# Patient Record
Sex: Male | Born: 1945
Health system: Southern US, Community
[De-identification: ages and names within clinical notes are randomized; demographics above are authoritative.]

## PROBLEM LIST (undated history)

## (undated) DIAGNOSIS — M109 Gout, unspecified: Secondary | ICD-10-CM

## (undated) DIAGNOSIS — F1010 Alcohol abuse, uncomplicated: Secondary | ICD-10-CM

## (undated) DIAGNOSIS — I519 Heart disease, unspecified: Secondary | ICD-10-CM

## (undated) DIAGNOSIS — H9190 Unspecified hearing loss, unspecified ear: Secondary | ICD-10-CM

## (undated) DIAGNOSIS — E785 Hyperlipidemia, unspecified: Secondary | ICD-10-CM

## (undated) DIAGNOSIS — I2510 Atherosclerotic heart disease of native coronary artery without angina pectoris: Secondary | ICD-10-CM

## (undated) DIAGNOSIS — K4090 Unilateral inguinal hernia, without obstruction or gangrene, not specified as recurrent: Secondary | ICD-10-CM

## (undated) DIAGNOSIS — Z9289 Personal history of other medical treatment: Secondary | ICD-10-CM

## (undated) DIAGNOSIS — I252 Old myocardial infarction: Secondary | ICD-10-CM

## (undated) HISTORY — DX: Atherosclerotic heart disease of native coronary artery without angina pectoris: I25.10

## (undated) HISTORY — DX: Unspecified hearing loss, unspecified ear: H91.90

## (undated) HISTORY — DX: Heart disease, unspecified: I51.9

## (undated) HISTORY — DX: Hyperlipidemia, unspecified: E78.5

## (undated) HISTORY — DX: Personal history of other medical treatment: Z92.89

## (undated) HISTORY — DX: Old myocardial infarction: I25.2

## (undated) HISTORY — DX: Alcohol abuse, uncomplicated: F10.10

## (undated) HISTORY — DX: Unilateral inguinal hernia, without obstruction or gangrene, not specified as recurrent: K40.90

## (undated) HISTORY — DX: Gout, unspecified: M10.9

---
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
## 2006-09-13 DIAGNOSIS — I252 Old myocardial infarction: Secondary | ICD-10-CM

## 2006-09-13 HISTORY — DX: Old myocardial infarction: I25.2

## 2008-09-12 ENCOUNTER — Inpatient Hospital Stay (HOSPITAL_COMMUNITY): Admission: RE | Admit: 2008-09-12 | Discharge: 2008-09-19 | Payer: Self-pay | Admitting: Cardiovascular Disease

## 2008-09-12 ENCOUNTER — Encounter: Payer: Self-pay | Admitting: Family Medicine

## 2008-09-12 ENCOUNTER — Encounter: Payer: Self-pay | Admitting: Emergency Medicine

## 2008-09-12 DIAGNOSIS — I252 Old myocardial infarction: Secondary | ICD-10-CM | POA: Insufficient documentation

## 2008-09-12 HISTORY — PX: CORONARY STENT PLACEMENT: SHX1402

## 2008-09-12 IMAGING — CR DG CHEST 1V PORT
1 series · 1 of 1 positions shown · non-contrast
Comparison: None.

CLINICAL DATA: Chest pain

CHEST - 1 VIEW

[view not recorded]
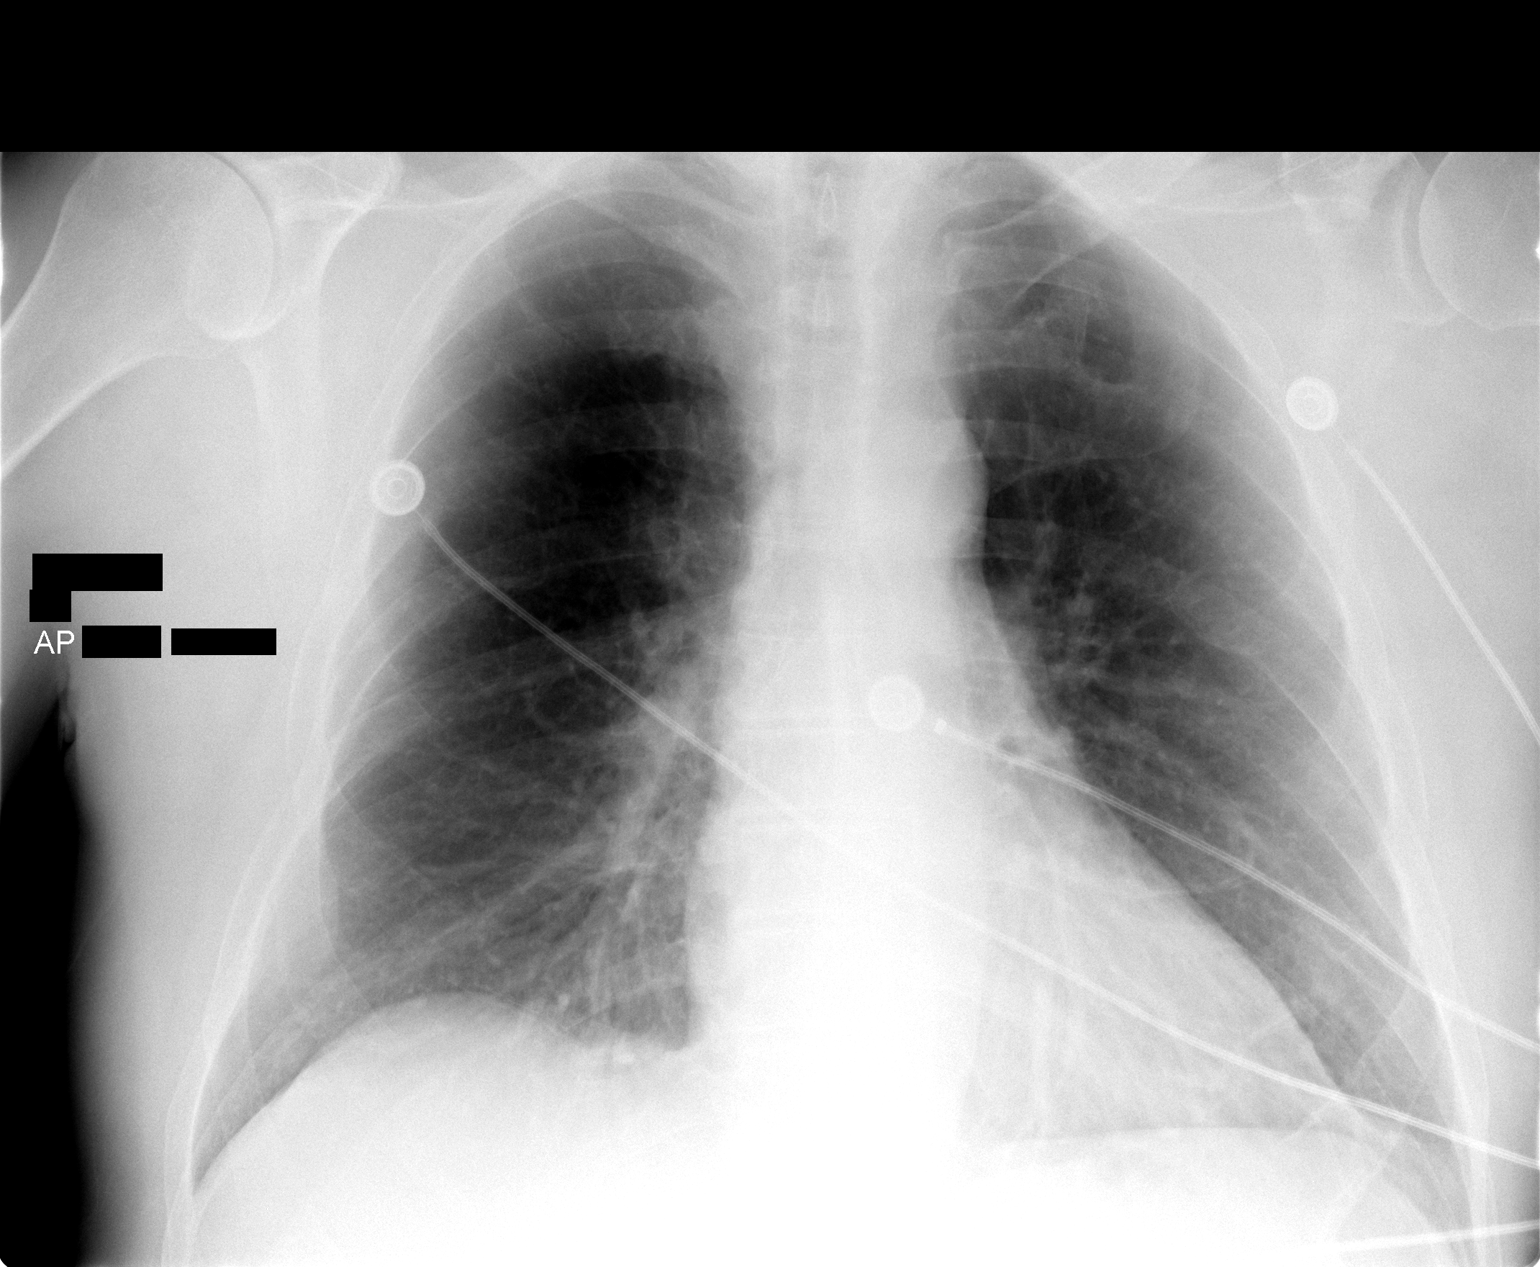

[1 of 1 positions shown; findings below may reference images not displayed]

FINDINGS: The heart size and mediastinal contours are within normal
limits.  Both lungs are clear.
IMPRESSION: No active disease.

## 2008-09-13 IMAGING — CR DG CHEST 1V PORT
1 series · 1 of 1 positions shown · non-contrast
Comparison: [DATE]

CLINICAL DATA: Congestive heart failure.  Shortness of breath.

PORTABLE CHEST - 1 VIEW

[view not recorded]
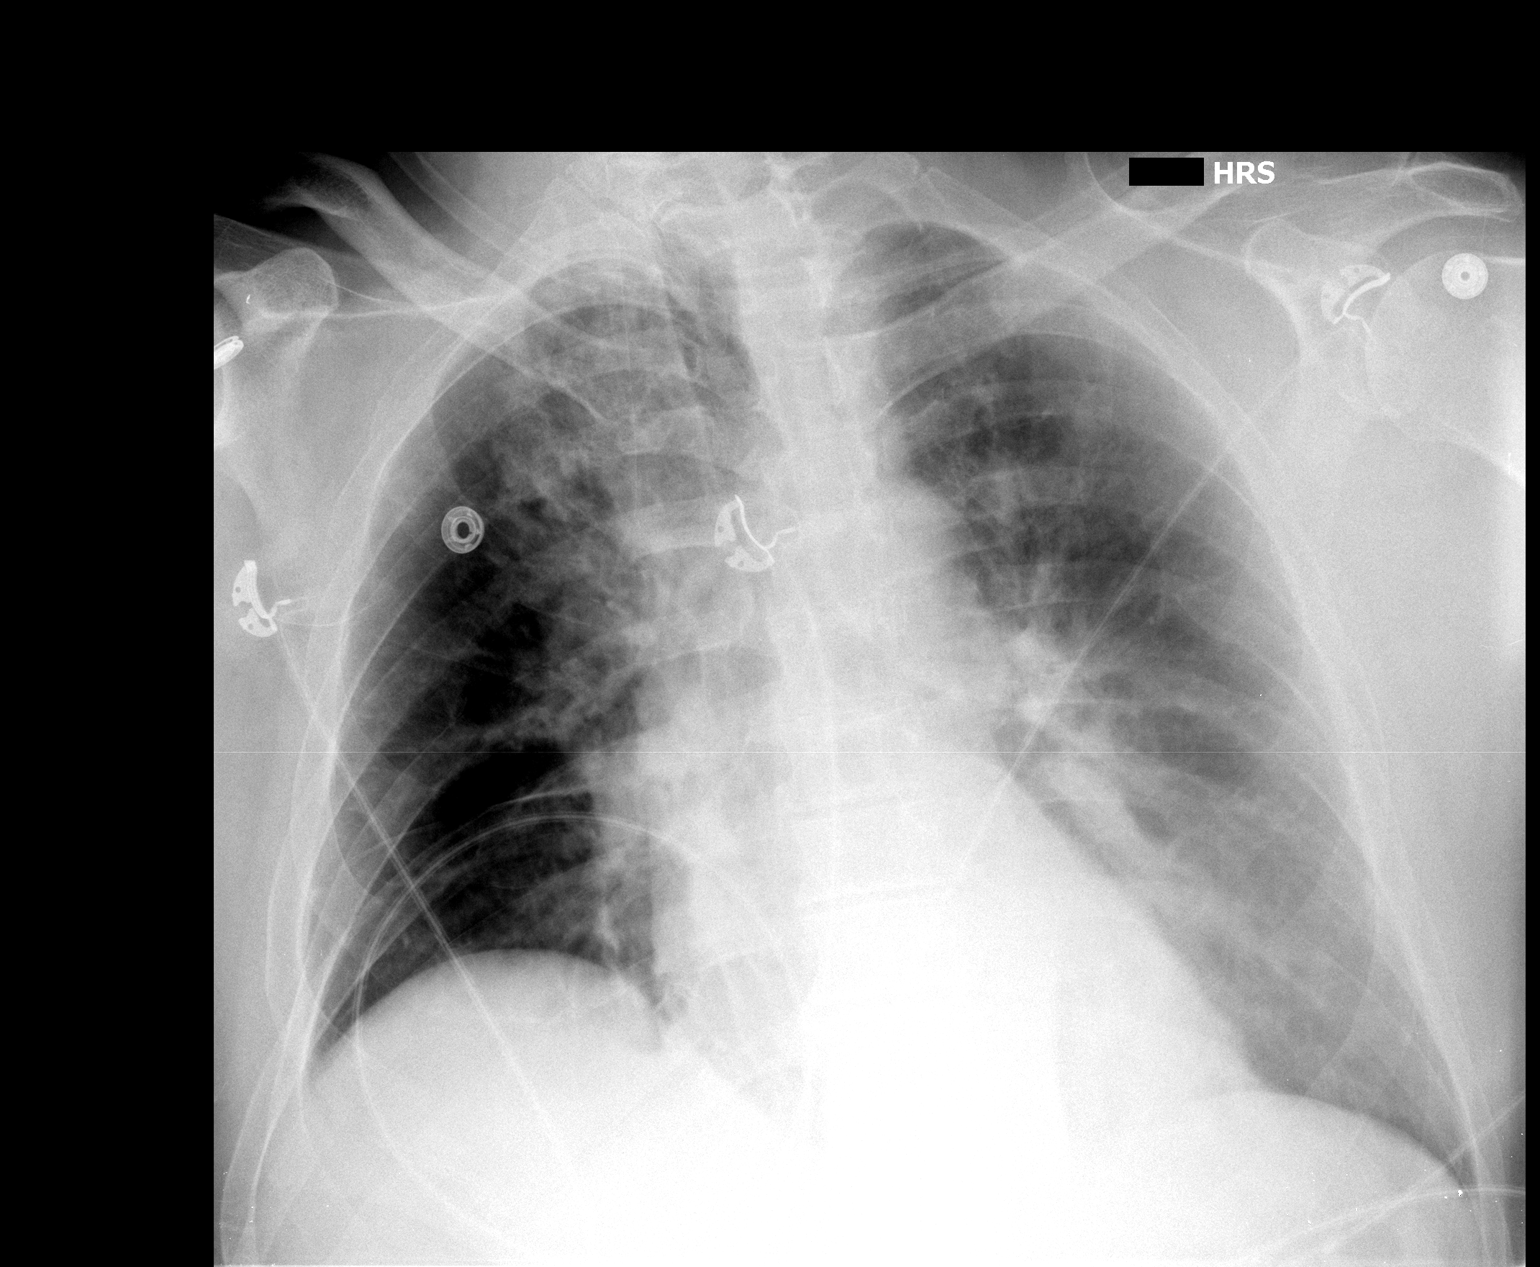

[1 of 1 positions shown; findings below may reference images not displayed]

FINDINGS: The patient has developed mild perihilar infiltrates
since the prior study.  Overall heart size is normal.  There are no
effusions.

This could represent mild pulmonary edema or pneumonia.  Is the
patient febrile?
IMPRESSION: Bilateral new perihilar infiltrates.

## 2008-09-14 IMAGING — CR DG CHEST 1V PORT
1 series · 1 of 1 positions shown · non-contrast
Comparison: [DATE]

CLINICAL DATA: Chest pain, weakness and shortness of breath

PORTABLE CHEST - 1 VIEW

[view not recorded]
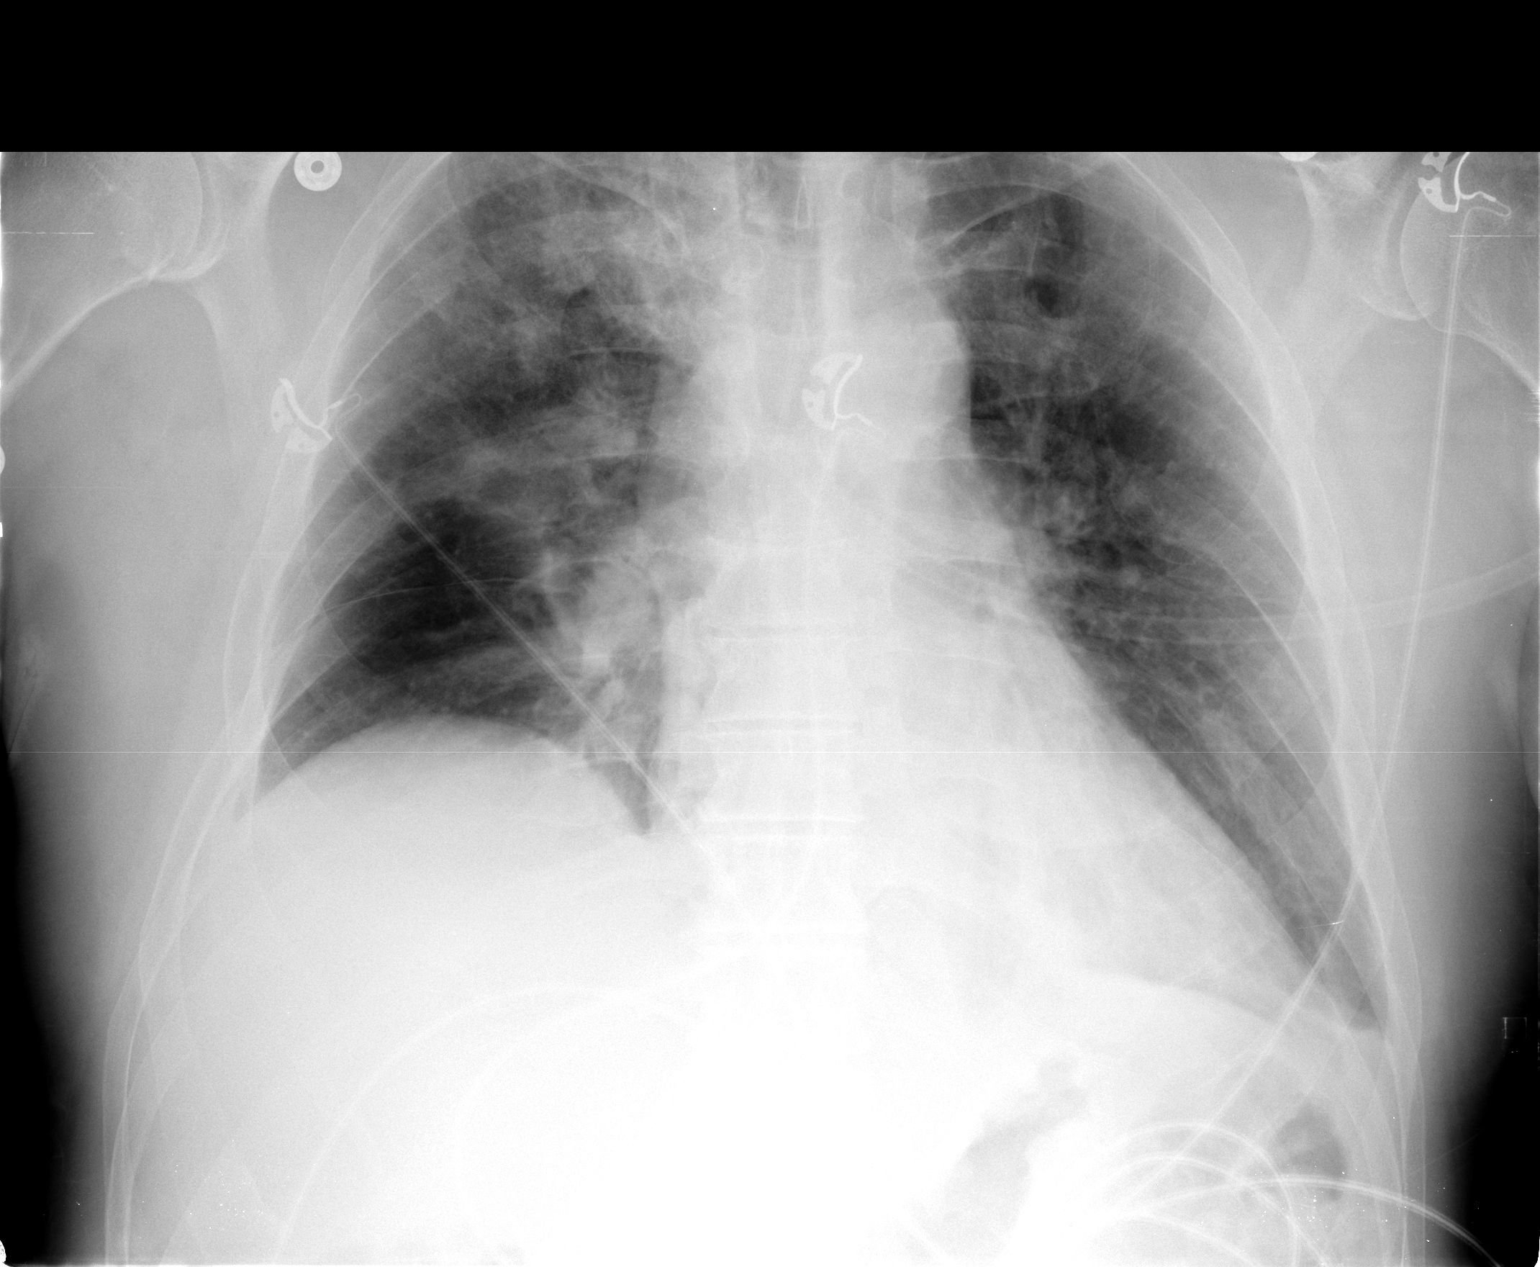

[1 of 1 positions shown; findings below may reference images not displayed]

FINDINGS: Heart size is mildly enlarged.

There is a small left pleural effusion.

Bilateral pulmonary infiltrates are unchanged from prior exam.
IMPRESSION: 1.  No change in aeration the lungs.

## 2008-09-15 ENCOUNTER — Encounter (INDEPENDENT_AMBULATORY_CARE_PROVIDER_SITE_OTHER): Payer: Self-pay | Admitting: Cardiovascular Disease

## 2008-09-15 ENCOUNTER — Ambulatory Visit: Payer: Self-pay | Admitting: Surgery

## 2008-09-15 IMAGING — CR DG CHEST 1V PORT
1 series · 1 of 1 positions shown · non-contrast
Comparison: [DATE]

CLINICAL DATA: Myocardial infarction.  Short of breath.

PORTABLE CHEST - 1 VIEW

[AP]
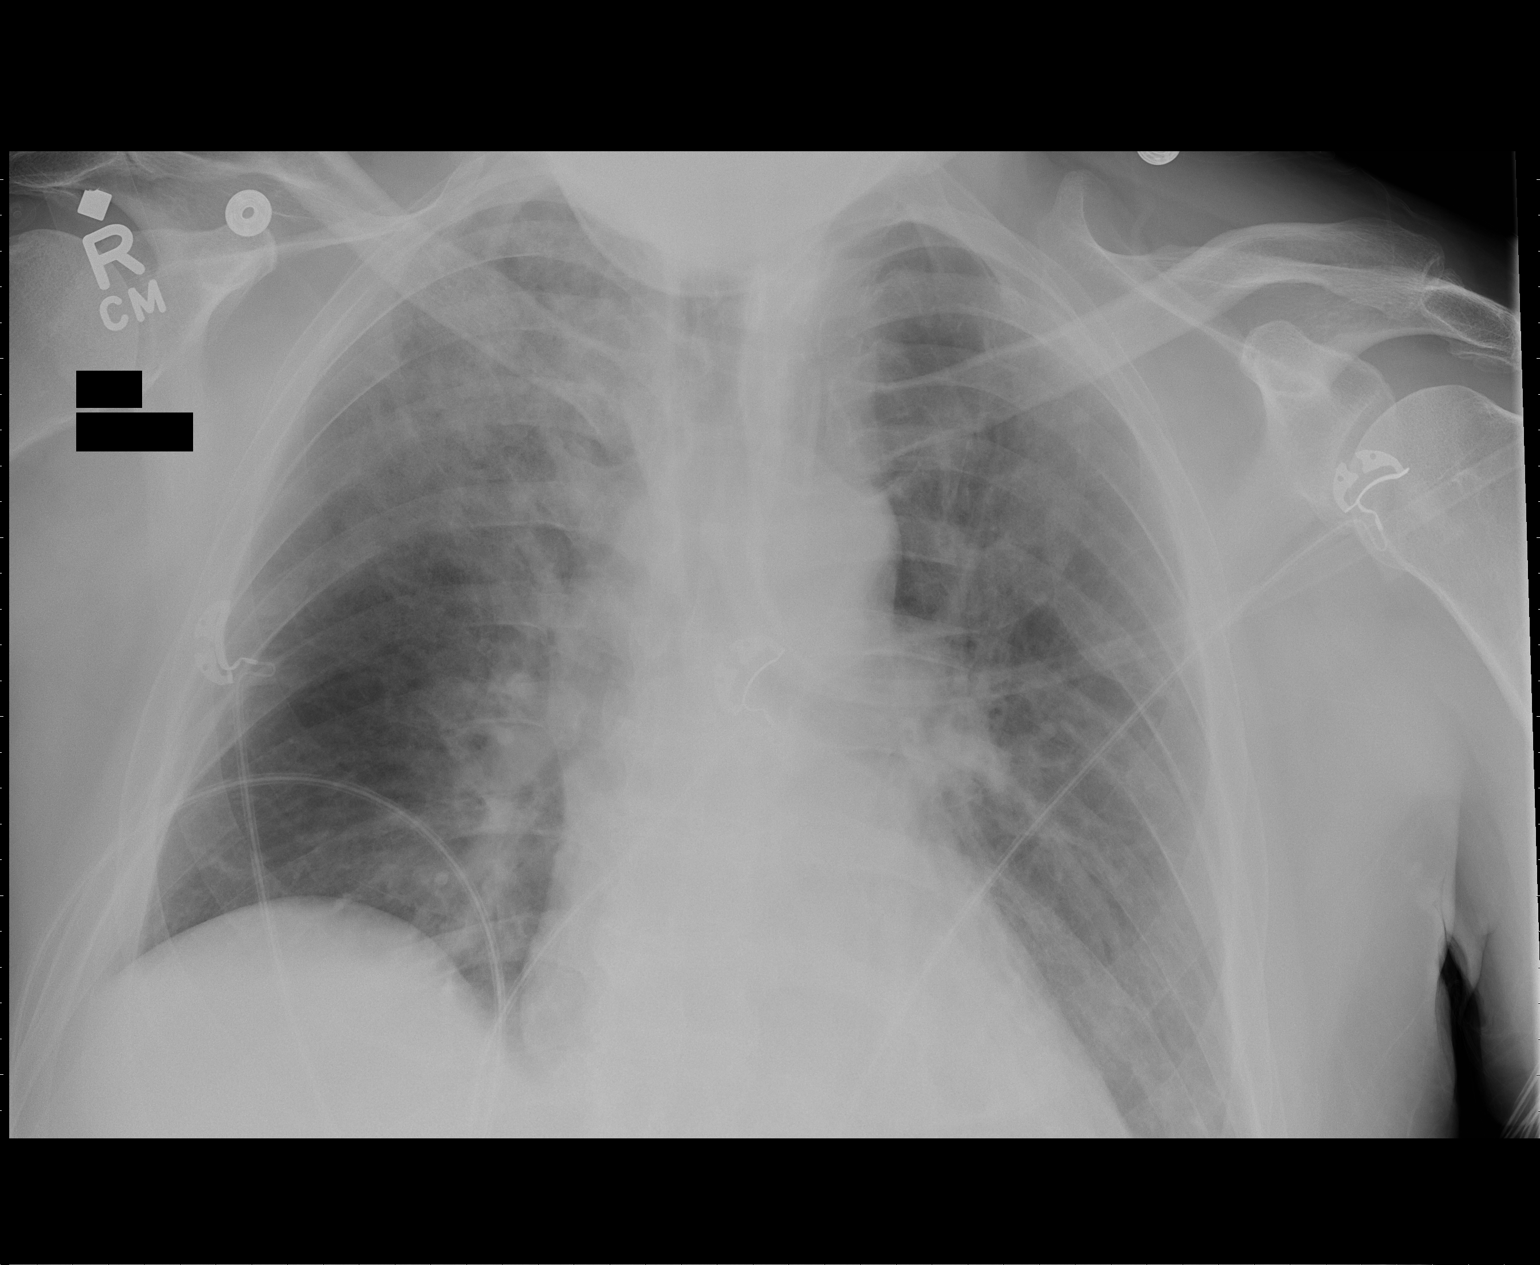

[1 of 1 positions shown; findings below may reference images not displayed]

FINDINGS: Persistent elevation of the right hemidiaphragm.
Worsening airspace disease at the right apex, likely representing
atypical/asymmetric pulmonary edema.  Aspiration would be a
secondary consideration.  Oxygen tubing is projected over the
chest.  Pulmonary vascular congestion is present.  Left
costophrenic angle excluded from view.  No definite pleural
effusion.  Cardiopericardial silhouette appears within normal
limits for size.  Mediastinal contours unchanged.
IMPRESSION: 1.  Slightly increased airspace opacity at the right apex, probably
representing asymmetric/atypical pulmonary edema.
2.  Pulmonary vascular congestion and mild pulmonary edema .

## 2008-09-16 IMAGING — CR DG CHEST 1V PORT
1 series · 1 of 1 positions shown · non-contrast
Comparison: [DATE]

CLINICAL DATA: Congestive heart failure.  Myocardial infarction.

PORTABLE CHEST - 1 VIEW

[AP]
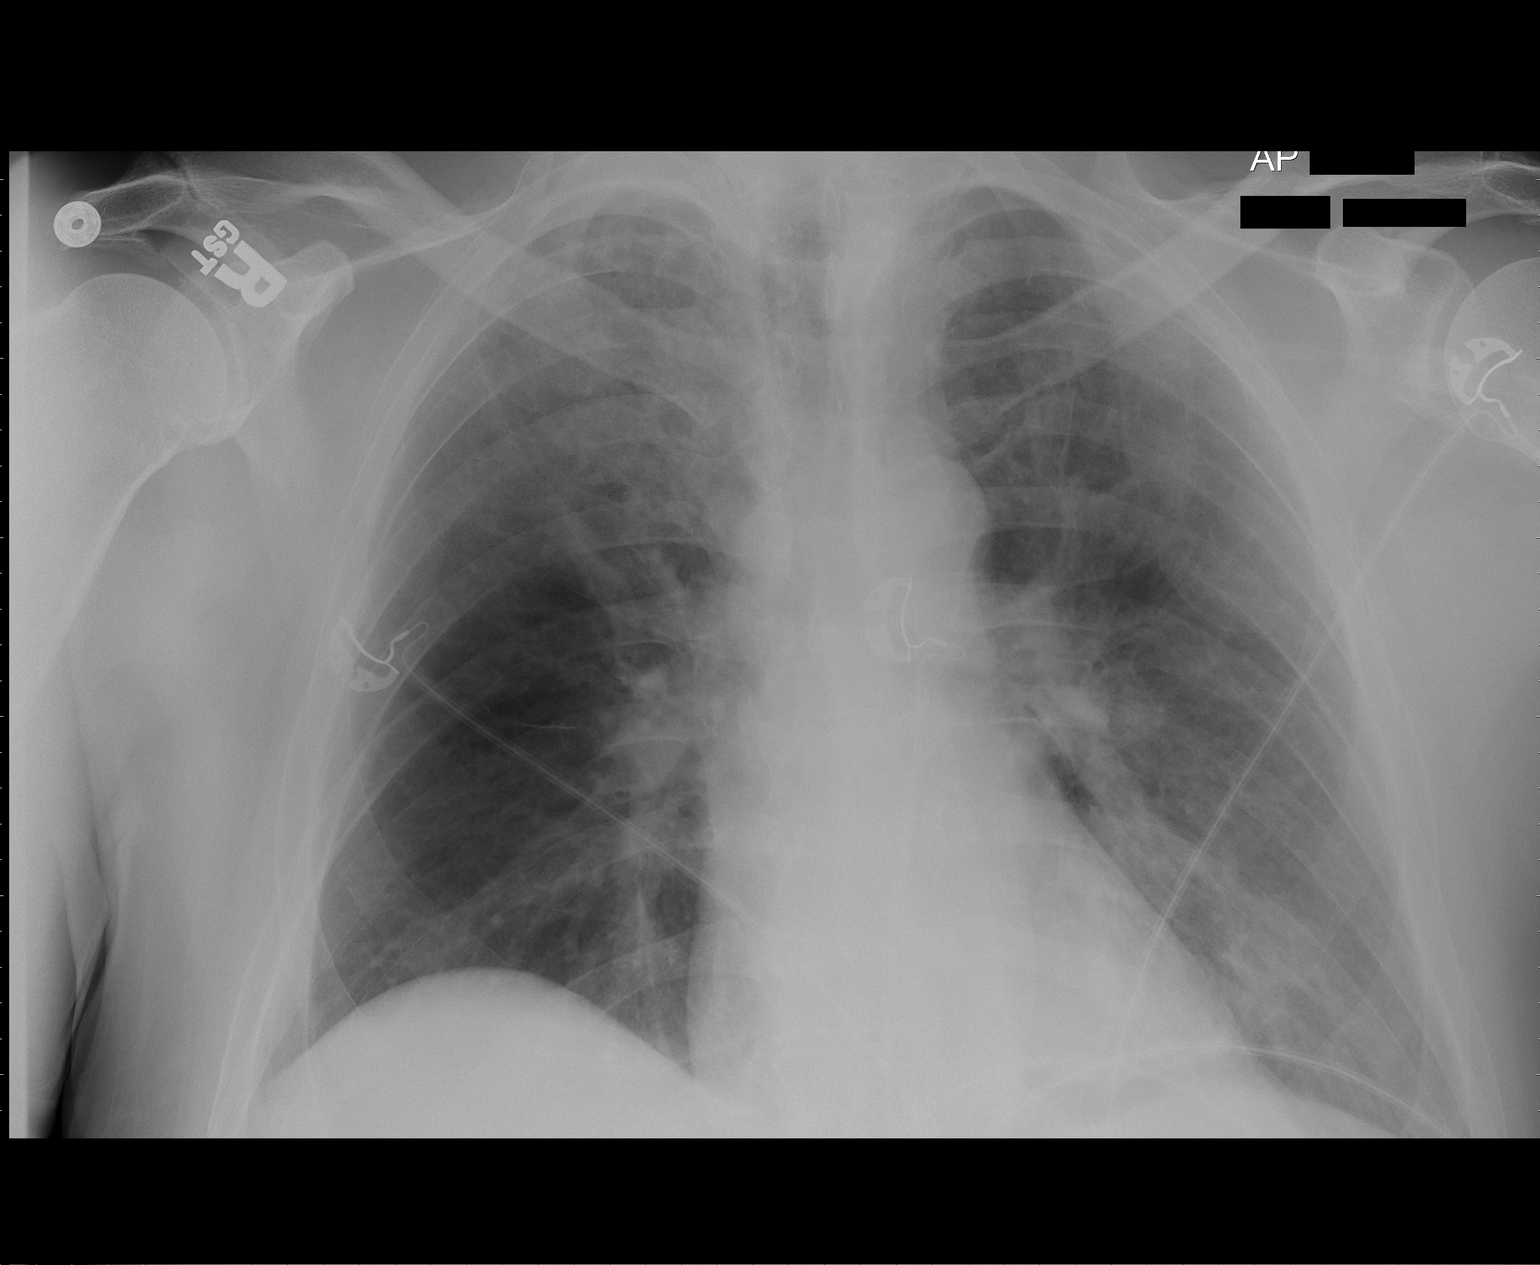

[1 of 1 positions shown; findings below may reference images not displayed]

FINDINGS: Continued improvement in aeration of the lungs is
present, with clearing of the right midlung.  Bibasilar airspace
opacity and apical airspace opacity also appears slightly improved
compared to prior exam.  Cardiopericardial silhouette appears
within normal limits for projection. Monitoring leads are projected
over the chest.
IMPRESSION: Improving pulmonary edema.

## 2008-09-17 IMAGING — CR DG CHEST 1V PORT
1 series · 1 of 1 positions shown · non-contrast
Comparison: Chest [DATE].

CLINICAL DATA: Code STEMI

PORTABLE CHEST - 1 VIEW

[AP]
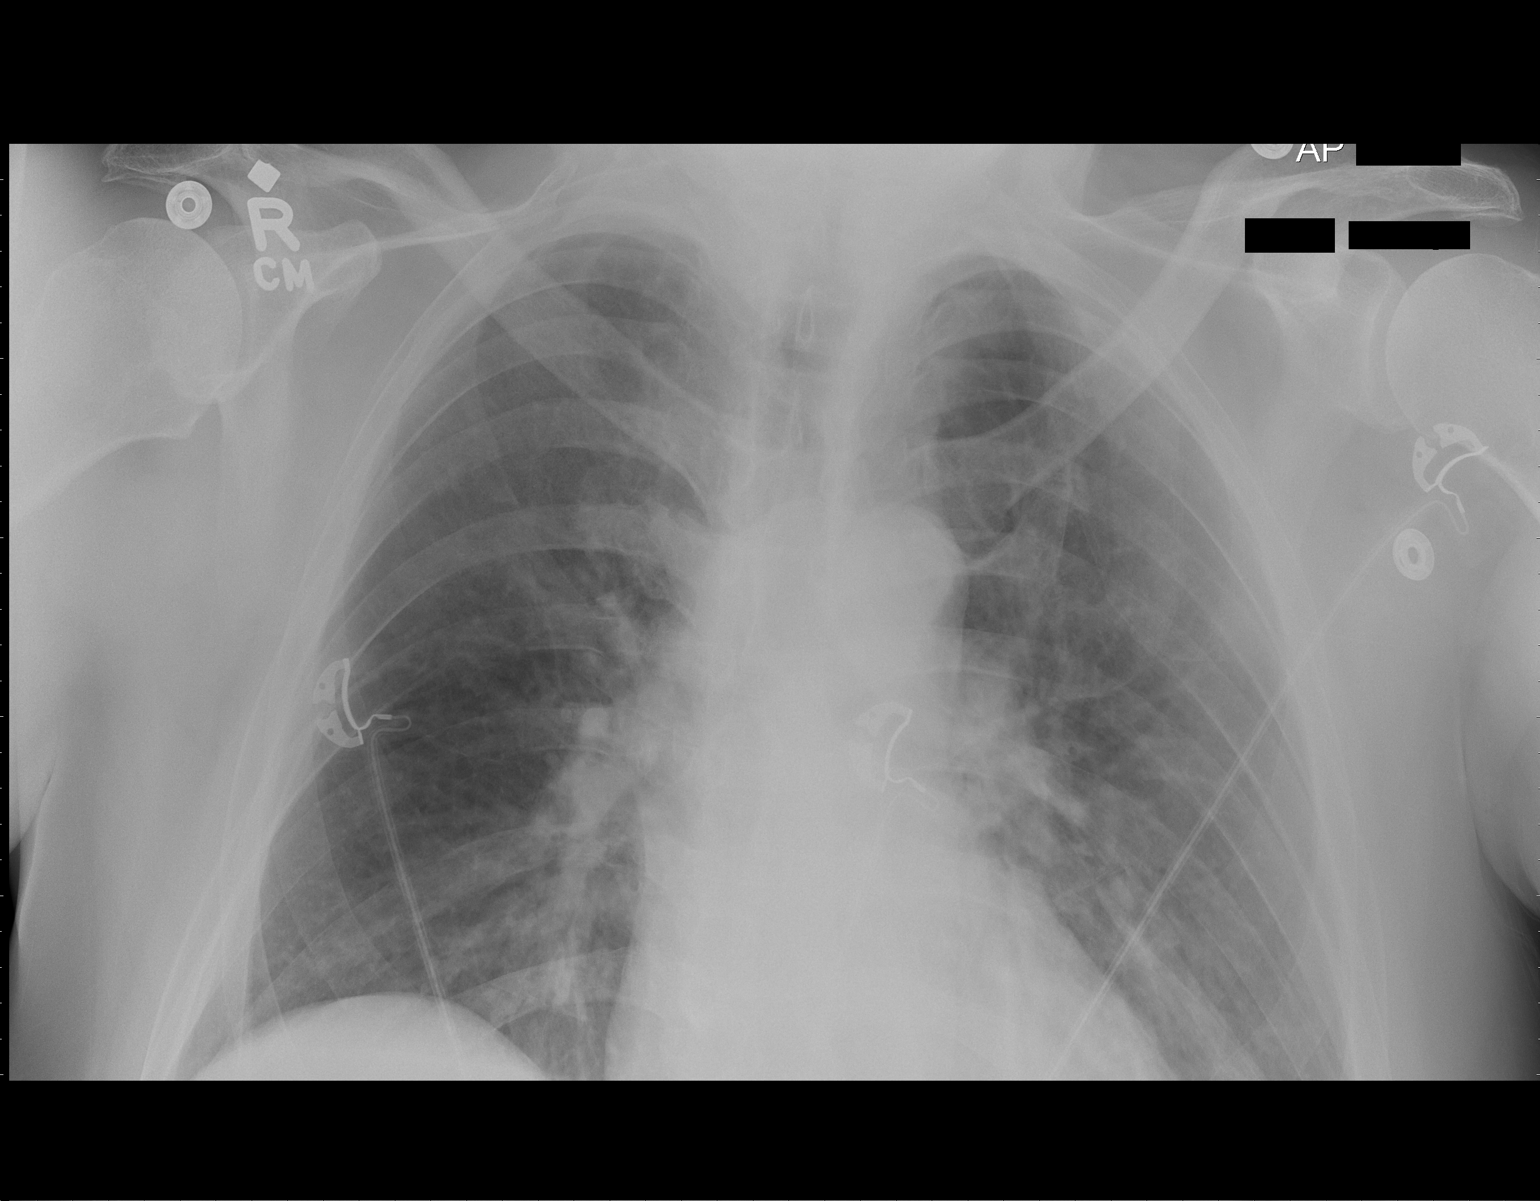

[1 of 1 positions shown; findings below may reference images not displayed]

FINDINGS: The left costophrenic angle is off the margin of the
film.  There has been continued improvement in airspace disease.
Pulmonary vascular congestion noted.
IMPRESSION: Continued improvement in aeration.  Pulmonary vascular congestion
is noted.

## 2008-11-27 ENCOUNTER — Encounter (HOSPITAL_COMMUNITY): Admission: RE | Admit: 2008-11-27 | Discharge: 2009-02-25 | Payer: Self-pay | Admitting: Cardiovascular Disease

## 2008-11-27 ENCOUNTER — Encounter: Payer: Self-pay | Admitting: Family Medicine

## 2009-01-29 ENCOUNTER — Ambulatory Visit: Payer: Self-pay | Admitting: Family Medicine

## 2009-01-29 DIAGNOSIS — I2510 Atherosclerotic heart disease of native coronary artery without angina pectoris: Secondary | ICD-10-CM

## 2009-01-29 DIAGNOSIS — M109 Gout, unspecified: Secondary | ICD-10-CM

## 2009-01-29 HISTORY — DX: Atherosclerotic heart disease of native coronary artery without angina pectoris: I25.10

## 2009-02-09 ENCOUNTER — Telehealth: Payer: Self-pay | Admitting: Family Medicine

## 2009-03-02 HISTORY — PX: TRANSTHORACIC ECHOCARDIOGRAM: SHX275

## 2009-05-25 ENCOUNTER — Ambulatory Visit: Payer: Self-pay | Admitting: Family Medicine

## 2009-05-25 DIAGNOSIS — K4090 Unilateral inguinal hernia, without obstruction or gangrene, not specified as recurrent: Secondary | ICD-10-CM

## 2009-05-25 HISTORY — DX: Unilateral inguinal hernia, without obstruction or gangrene, not specified as recurrent: K40.90

## 2009-06-20 HISTORY — PX: HERNIA REPAIR: SHX51

## 2009-07-24 ENCOUNTER — Telehealth: Payer: Self-pay | Admitting: Family Medicine

## 2009-09-24 ENCOUNTER — Encounter: Payer: Self-pay | Admitting: Family Medicine

## 2009-10-06 ENCOUNTER — Encounter: Payer: Self-pay | Admitting: Family Medicine

## 2009-12-23 IMAGING — CR DG CHEST 2V
2 series · 2 of 2 positions shown · non-contrast
Comparison: Chest [DATE].

CLINICAL DATA: Preop respiratory film.

CHEST - 2 VIEW

[w chest pa]
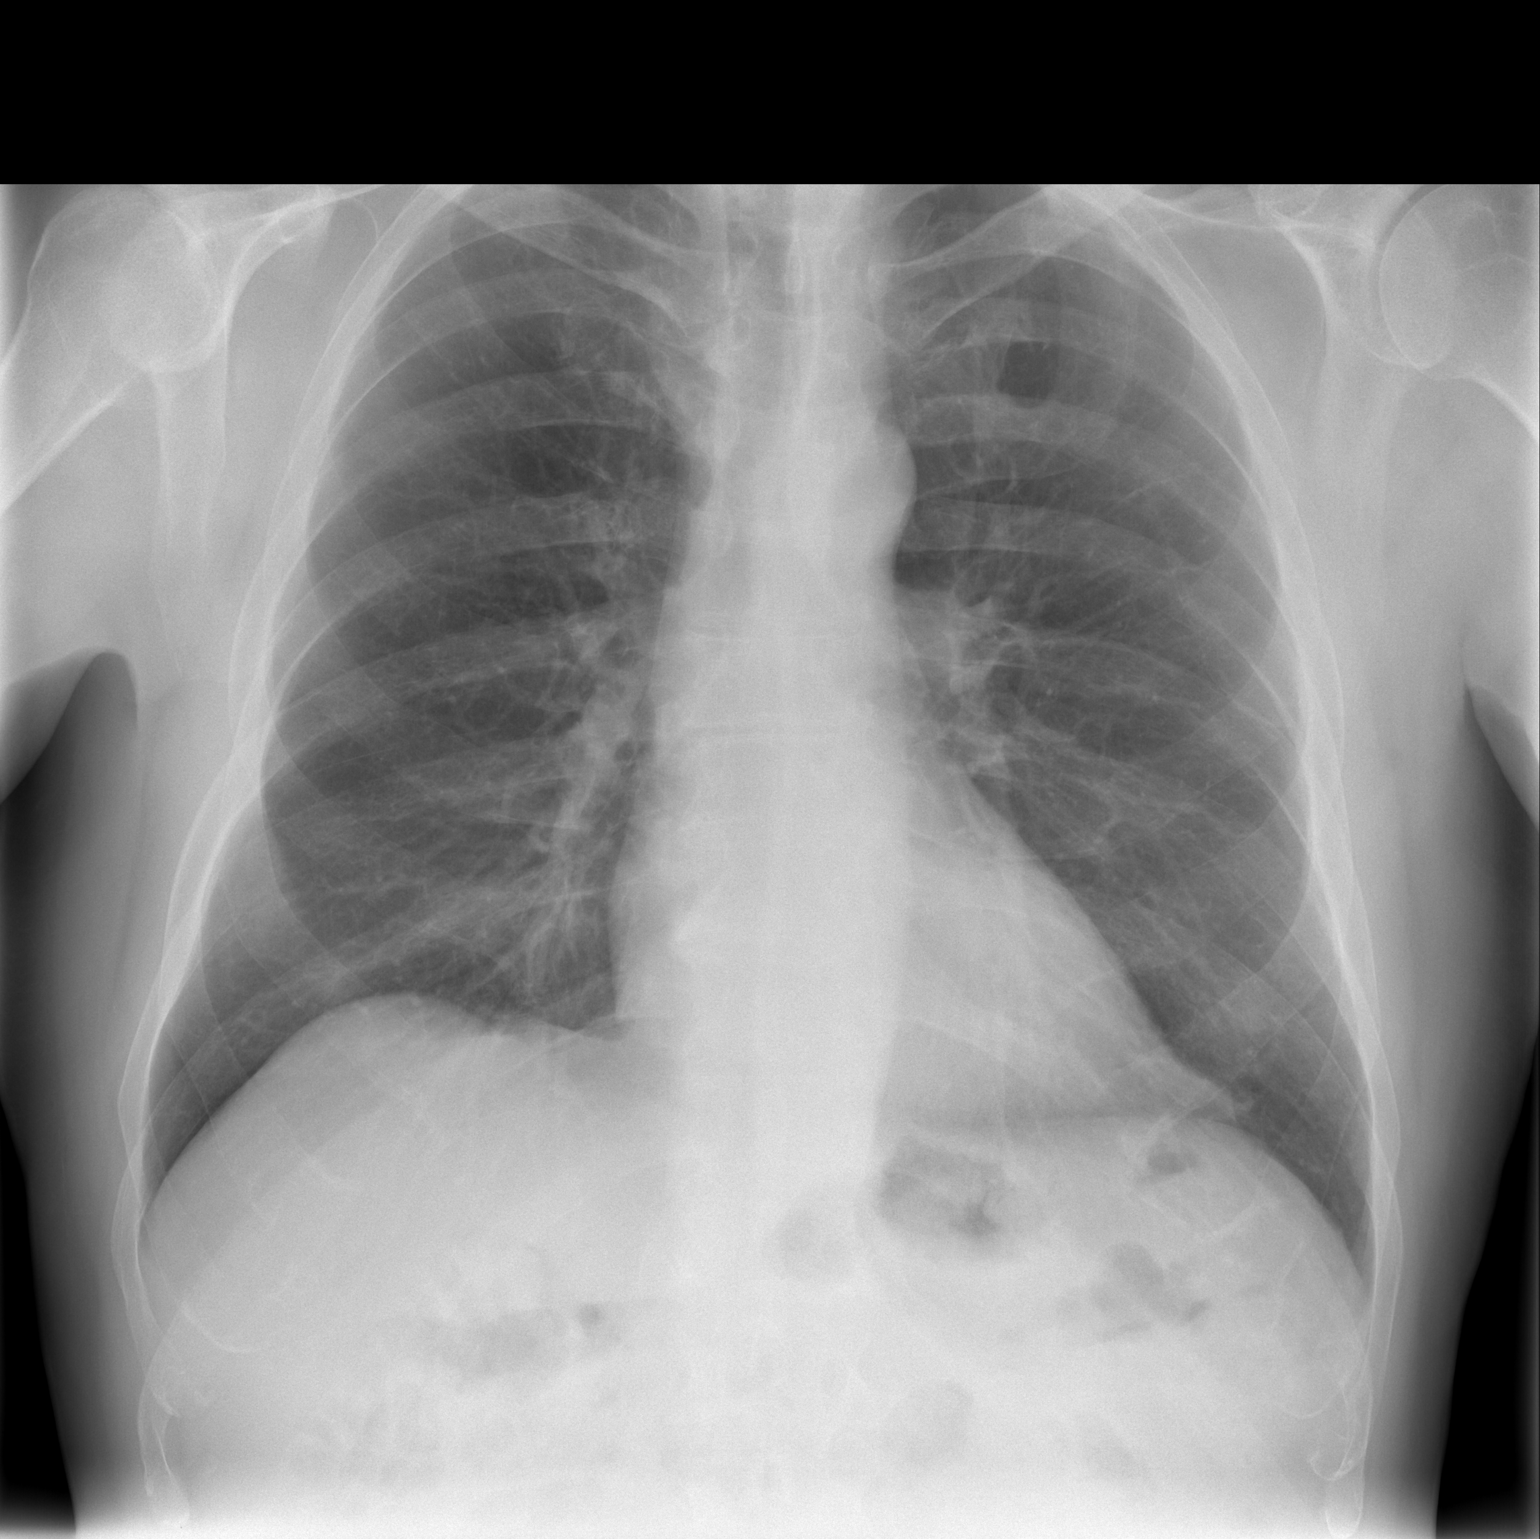

[w chest lat]
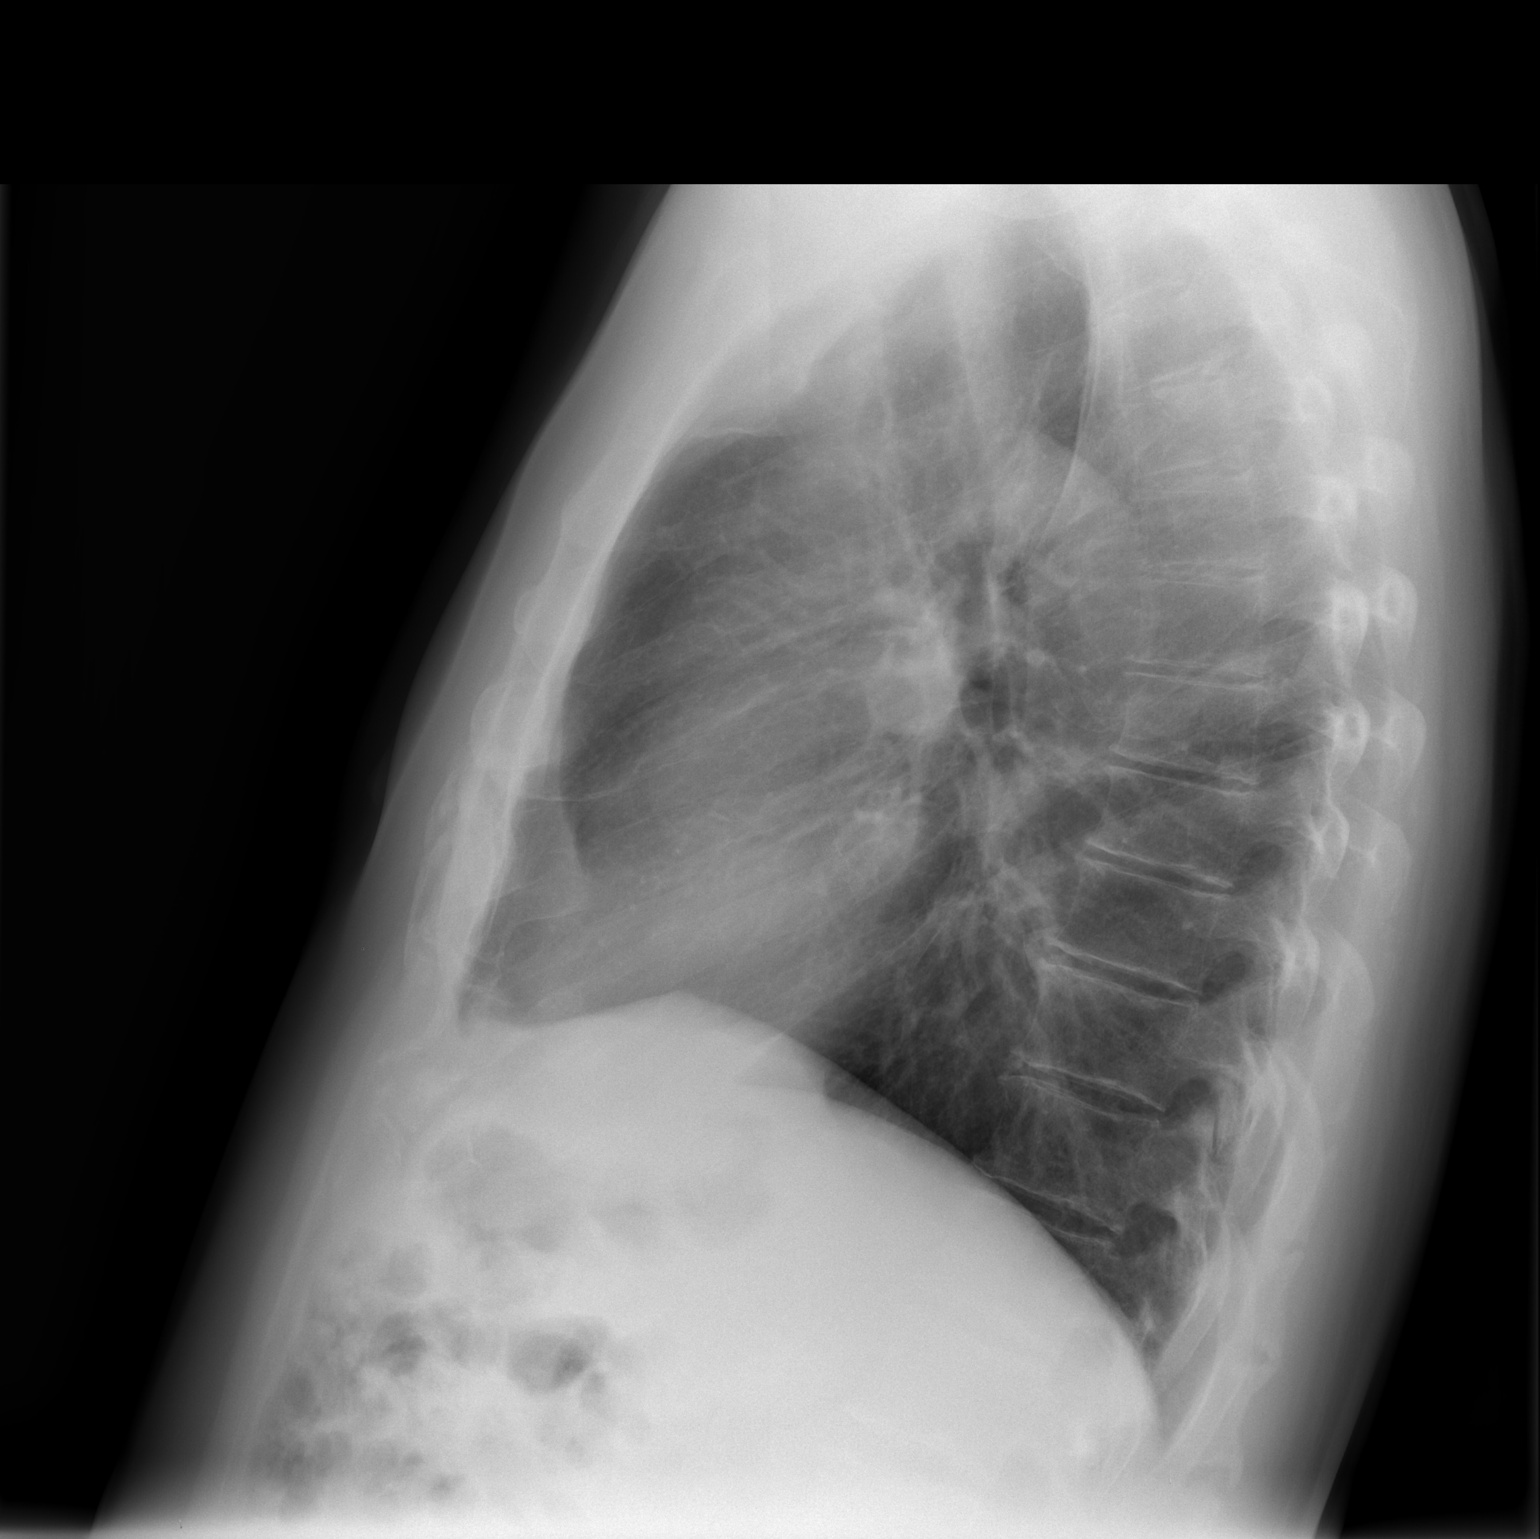

[2 of 2 positions shown; findings below may reference images not displayed]

FINDINGS: The lungs are clear.  No pleural effusion.  Heart size
normal.
IMPRESSION: No acute disease.

## 2009-12-30 ENCOUNTER — Ambulatory Visit (HOSPITAL_COMMUNITY): Admission: RE | Admit: 2009-12-30 | Discharge: 2009-12-30 | Payer: Self-pay | Admitting: Surgery

## 2010-01-04 ENCOUNTER — Telehealth: Payer: Self-pay | Admitting: Family Medicine

## 2010-01-07 ENCOUNTER — Ambulatory Visit: Payer: Self-pay | Admitting: Family Medicine

## 2010-05-04 ENCOUNTER — Ambulatory Visit (HOSPITAL_COMMUNITY): Admission: RE | Admit: 2010-05-04 | Discharge: 2010-05-04 | Payer: Self-pay | Admitting: Surgery

## 2010-07-20 NOTE — Progress Notes (Signed)
Summary: neck pain  Phone Note Call from Patient   Caller: Patient Call For: Evelena Peat MD Summary of Call: Pt has muscle spasm in left side of neck x one day. Feels fine otherwise.  No fever or any other symptoms.  Would like a muscle relaxer. [facility] [place]) [phone] Initial call taken by: Lynann Beaver CMA,  July 24, 2009 2:10 PM  Follow-up for Phone Call        OK to call in Skelaxin 800 mg 0ne by mouth q 8 hours as needed spasm #20 with 0 refill and office f/u if no better. Follow-up by: Evelena Peat MD,  July 24, 2009 2:47 PM    New/Updated Medications: SKELAXIN 800 MG TABS (METAXALONE) one by mouth q 8 hours Prescriptions: SKELAXIN 800 MG TABS (METAXALONE) one by mouth q 8 hours  #20 x 0   Entered by:   Lynann Beaver CMA   Authorized by:   Evelena Peat MD   Signed by:   Lynann Beaver CMA on 07/24/2009   Method used:   Electronically to        [facility]. [phone]* (retail)       [address]       [place], [place]  [address]       Ph: [phone]       Fax: [phone]   RxID:   [id number]  Pt notified.

## 2010-07-20 NOTE — Assessment & Plan Note (Signed)
Summary: FUP MEDS/CCM   Vital Signs:  Patient profile:   65 year old male Weight:      189 pounds Temp:     98 degrees F oral BP sitting:   98 / 70  (left arm) Cuff size:   regular  Vitals Entered By: Sid Falcon LPN (January 07, 2010 11:30 AM) CC: med refills   History of Present Illness: Patient seen for followup regarding gout issues. Has about 4 episodes per year. Has previously been on colchicine and now needs prescription for Colcrys.  Also allopurinol 300 mg daily needs refills.  Recent hernia repair and he is healing uneventfully.  History of CAD. Nuclear stress test in April unremarkable. No recent chest pains.  Patient has several questions regarding preventive issues. No history of Pneumovax. Last tetanus over 10 years ago. No colonoscopy in some time. Is not sure exactly when and will try to obtain old records. Previous screening in [facility] and he prefers to follow up with someone more local.    Allergies (verified): No Known Drug Allergies  Past History:  Past Medical History: Last updated: 01/29/2009 Heart disease MI 08/2008 Cardiologist [name] High Cholesterol Gout  Past Surgical History: Last updated: 01/29/2009 Stent Implant following HA 09/12/08  Family History: Last updated: 01/29/2009 Family History of Colon CA, father Family History High cholesterol, brother Heart disease, brother massive HA age 35  Social History: Last updated: 01/29/2009 Occupation: Divorced Alcohol use-yes Regular exercise-yes Past smoker  Risk Factors: Exercise: yes (01/29/2009) PMH-FH-SH reviewed for relevance  Review of Systems  The patient denies anorexia, weight loss, chest pain, syncope, dyspnea on exertion, peripheral edema, and abdominal pain.    Physical Exam  General:  Well-developed,well-nourished,in no acute distress; alert,appropriate and cooperative throughout examination Ears:  External ear exam shows no significant lesions or deformities.   Otoscopic examination reveals clear canals, tympanic membranes are intact bilaterally without bulging, retraction, inflammation or discharge. Hearing is grossly normal bilaterally. Mouth:  Oral mucosa and oropharynx without lesions or exudates.  Teeth in good repair. Neck:  No deformities, masses, or tenderness noted. Lungs:  Normal respiratory effort, chest expands symmetrically. Lungs are clear to auscultation, no crackles or wheezes. Heart:  Normal rate and regular rhythm. S1 and S2 normal without gallop, murmur, click, rub or other extra sounds. Extremities:  no edema   Impression & Recommendations:  Problem # 1:  GOUT (ICD-274.9) script for Colcrys. His updated medication list for this problem includes:    Allopurinol 300 Mg Tabs (Allopurinol) ..... Once daily    Colcrys 0.6 Mg Tabs (Colchicine) ..... One by mouth two times a day    Colcrys 0.6 Mg Tabs (Colchicine) ..... One tab daily  Problem # 2:  CAD (ICD-414.00)  His updated medication list for this problem includes:    Coreg 12.5 Mg Tabs (Carvedilol) .Marland Kitchen..Marland Kitchen Two times a day    Effient 10 Mg Tabs (Prasugrel hcl) ..... Once daily    Isosorbide Mononitrate Cr 30 Mg Xr24h-tab (Isosorbide mononitrate) ..... Once daily    Aspirin 325 Mg Tabs (Aspirin) ..... Once daily    Ramipril 10 Mg Caps (Ramipril) ..... Once daily  Problem # 3:  Preventive Health Care (ICD-V70.0) we'll give Tdap and Pneumovax. Patient will get information regarding prior colonoscopy date and results  Complete Medication List: 1)  Coreg 12.5 Mg Tabs (Carvedilol) .... Two times a day 2)  Effient 10 Mg Tabs (Prasugrel hcl) .... Once daily 3)  Isosorbide Mononitrate Cr 30 Mg Xr24h-tab (Isosorbide mononitrate) .Marland KitchenMarland KitchenMarland Kitchen  Once daily 4)  Allopurinol 300 Mg Tabs (Allopurinol) .... Once daily 5)  Simvastatin 40 Mg Tabs (Simvastatin) .... Once daily 6)  Aspirin 325 Mg Tabs (Aspirin) .... Once daily 7)  Protonix 40 Mg Tbec (Pantoprazole sodium) .... As needed 8)   Calcium-vitamin D 250-125 Mg-unit Tabs (Calcium carbonate-vitamin d) .... Once daily 9)  Ramipril 10 Mg Caps (Ramipril) .... Once daily 10)  Colcrys 0.6 Mg Tabs (Colchicine) .... One by mouth two times a day 11)  Skelaxin 800 Mg Tabs (Metaxalone) .... One by mouth q 8 hours 12)  Zetia 10 Mg Tabs (Ezetimibe) .... Once daily 13)  Colcrys 0.6 Mg Tabs (Colchicine) .... One tab daily  Other Orders: Tdap => 59yrs IM (04540) Pneumococcal Vaccine (98119) Admin 1st Vaccine (14782) Admin of Any Addtl Vaccine (95621)  Patient Instructions: 1)  Please schedule a follow-up appointment in 1 year.  Prescriptions: COLCRYS 0.6 MG TABS (COLCHICINE) one by mouth two times a day  #60 x 11   Entered and Authorized by:   Evelena Peat MD   Signed by:   Evelena Peat MD on 01/07/2010   Method used:   Electronically to        [facility]. [phone]* (retail)       [address]       [place], [place]  [address]       Ph: [phone]       Fax: [phone]   RxID:   [id number]    Immunizations Administered:  Tetanus Vaccine:    Vaccine Type: Tdap    Site: left deltoid    Mfr: GlaxoSmithKline    Dose: 0.5 ml    Route: IM    Given by: Sid Falcon LPN    Exp. Date: 07/09/2011    Lot #: [id number]  Pneumonia Vaccine:    Vaccine Type: Pneumovax    Site: right deltoid    Mfr: Merck    Dose: 0.5 ml    Route: IM    Given by: Sid Falcon LPN    Exp. Date: 04/14/2011    Lot #: [id number]

## 2010-07-20 NOTE — Letter (Signed)
Summary: [facility]  [facility]   Imported By: Maryln Gottron 01/13/2010 09:53:47  _____________________________________________________________________  External Attachment:    Type:   Image     Comment:   External Document

## 2010-07-20 NOTE — Progress Notes (Signed)
Summary: Question about seeing surgeon  Phone Note Call from Patient Call back at Work Phone [phone]   Caller: Patient Call For: Evelena Peat MD Summary of Call: Recently had surgery, needs to know if he should schedule an appt with surgeon prior to scheduling appt to see Dr. Caryl Never? Initial call taken by: Trixie Dredge,  January 04, 2010 3:49 PM  Follow-up for Phone Call        Pt scheduled for thursday Follow-up by: Sid Falcon LPN,  January 05, 2010 9:34 AM

## 2010-08-10 ENCOUNTER — Other Ambulatory Visit: Payer: Self-pay | Admitting: Family Medicine

## 2010-09-05 LAB — DIFFERENTIAL
Basophils Absolute: 0 10*3/uL (ref 0.0–0.1)
Basophils Relative: 1 % (ref 0–1)
Eosinophils Absolute: 0.4 10*3/uL (ref 0.0–0.7)
Monocytes Relative: 12 % (ref 3–12)
Neutrophils Relative %: 51 % (ref 43–77)

## 2010-09-05 LAB — CBC
HCT: 46.7 % (ref 39.0–52.0)
Hemoglobin: 16 g/dL (ref 13.0–17.0)
MCHC: 34.2 g/dL (ref 30.0–36.0)
MCV: 97.7 fL (ref 78.0–100.0)
RBC: 4.78 MIL/uL (ref 4.22–5.81)

## 2010-09-05 LAB — COMPREHENSIVE METABOLIC PANEL
Alkaline Phosphatase: 77 U/L (ref 39–117)
CO2: 23 mEq/L (ref 19–32)
Creatinine, Ser: 0.99 mg/dL (ref 0.4–1.5)
Glucose, Bld: 98 mg/dL (ref 70–99)
Total Protein: 7.3 g/dL (ref 6.0–8.3)

## 2010-09-05 LAB — SURGICAL PCR SCREEN: Staphylococcus aureus: NEGATIVE

## 2010-09-29 LAB — BASIC METABOLIC PANEL
BUN: 16 mg/dL (ref 6–23)
CO2: 23 mEq/L (ref 19–32)
Chloride: 105 mEq/L (ref 96–112)
Chloride: 111 mEq/L (ref 96–112)
Creatinine, Ser: 0.97 mg/dL (ref 0.4–1.5)
Creatinine, Ser: 1 mg/dL (ref 0.4–1.5)
GFR calc Af Amer: >60 mL/min (ref >60)
GFR calc non Af Amer: >60 mL/min (ref >60)
Glucose, Bld: 112 mg/dL — ABNORMAL HIGH (ref 70–99)
Sodium: 139 mEq/L (ref 135–145)

## 2010-09-29 LAB — BRAIN NATRIURETIC PEPTIDE: Pro B Natriuretic peptide (BNP): 313 pg/mL — ABNORMAL HIGH (ref 0.0–100.0)

## 2010-09-29 LAB — CBC
MCHC: 35.4 g/dL (ref 30.0–36.0)
MCV: 94.9 fL (ref 78.0–100.0)
RBC: 3.81 MIL/uL — ABNORMAL LOW (ref 4.22–5.81)
RDW: 13.4 % (ref 11.5–15.5)

## 2010-09-30 LAB — BASIC METABOLIC PANEL
BUN: 12 mg/dL (ref 6–23)
BUN: 7 mg/dL (ref 6–23)
CO2: 22 mEq/L (ref 19–32)
CO2: 24 mEq/L (ref 19–32)
CO2: 24 mEq/L (ref 19–32)
CO2: 24 mEq/L (ref 19–32)
Calcium: 7.8 mg/dL — ABNORMAL LOW (ref 8.4–10.5)
Calcium: 8.3 mg/dL — ABNORMAL LOW (ref 8.4–10.5)
Chloride: 103 mEq/L (ref 96–112)
Creatinine, Ser: 0.99 mg/dL (ref 0.4–1.5)
Creatinine, Ser: 1 mg/dL (ref 0.4–1.5)
Creatinine, Ser: 1.05 mg/dL (ref 0.4–1.5)
GFR calc Af Amer: >60 mL/min (ref >60)
GFR calc non Af Amer: >60 mL/min (ref >60)
GFR calc non Af Amer: >60 mL/min (ref >60)
GFR calc non Af Amer: >60 mL/min (ref >60)
GFR calc non Af Amer: >60 mL/min (ref >60)
Glucose, Bld: 121 mg/dL — ABNORMAL HIGH (ref 70–99)
Glucose, Bld: 125 mg/dL — ABNORMAL HIGH (ref 70–99)
Glucose, Bld: 142 mg/dL — ABNORMAL HIGH (ref 70–99)
Potassium: 3.4 mEq/L — ABNORMAL LOW (ref 3.5–5.1)
Potassium: 3.9 mEq/L (ref 3.5–5.1)
Sodium: 136 mEq/L (ref 135–145)

## 2010-09-30 LAB — HEPARIN LEVEL (UNFRACTIONATED)
Heparin Unfractionated: 0.22 IU/mL — ABNORMAL LOW (ref 0.30–0.70)
Heparin Unfractionated: 0.37 IU/mL (ref 0.30–0.70)
Heparin Unfractionated: 0.46 IU/mL (ref 0.30–0.70)

## 2010-09-30 LAB — CBC
HCT: 34.3 % — ABNORMAL LOW (ref 39.0–52.0)
HCT: 37.1 % — ABNORMAL LOW (ref 39.0–52.0)
Hemoglobin: 11.9 g/dL — ABNORMAL LOW (ref 13.0–17.0)
Hemoglobin: 12.8 g/dL — ABNORMAL LOW (ref 13.0–17.0)
Hemoglobin: 15.7 g/dL (ref 13.0–17.0)
MCHC: 33.9 g/dL (ref 30.0–36.0)
MCHC: 34.8 g/dL (ref 30.0–36.0)
MCHC: 35.7 g/dL (ref 30.0–36.0)
MCHC: 35.8 g/dL (ref 30.0–36.0)
MCV: 94.1 fL (ref 78.0–100.0)
MCV: 96.2 fL (ref 78.0–100.0)
MCV: 96.4 fL (ref 78.0–100.0)
Platelets: 123 10*3/uL — ABNORMAL LOW (ref 150–400)
Platelets: 134 10*3/uL — ABNORMAL LOW (ref 150–400)
RBC: 3.73 MIL/uL — ABNORMAL LOW (ref 4.22–5.81)
RBC: 4.81 MIL/uL (ref 4.22–5.81)
RDW: 13.4 % (ref 11.5–15.5)
RDW: 13.4 % (ref 11.5–15.5)
RDW: 13.4 % (ref 11.5–15.5)
RDW: 13.6 % (ref 11.5–15.5)
RDW: 13.8 % (ref 11.5–15.5)
WBC: 10.4 10*3/uL (ref 4.0–10.5)
WBC: 11.1 10*3/uL — ABNORMAL HIGH (ref 4.0–10.5)

## 2010-09-30 LAB — CK TOTAL AND CKMB (NOT AT ARMC)
CK, MB: 104.3 ng/mL — ABNORMAL HIGH (ref 0.3–4.0)
CK, MB: 72.9 ng/mL — ABNORMAL HIGH (ref 0.3–4.0)
Total CK: 1589 U/L — ABNORMAL HIGH (ref 7–232)
Total CK: 1770 U/L — ABNORMAL HIGH (ref 7–232)

## 2010-09-30 LAB — LIPID PANEL
HDL: 53 mg/dL (ref >39)
Total CHOL/HDL Ratio: 3.2 RATIO
Triglycerides: 41 mg/dL (ref <150)
VLDL: 8 mg/dL (ref 0–40)

## 2010-09-30 LAB — DIFFERENTIAL
Basophils Absolute: 0.1 10*3/uL (ref 0.0–0.1)
Basophils Relative: 1 % (ref 0–1)
Eosinophils Relative: 3 % (ref 0–5)
Lymphocytes Relative: 15 % (ref 12–46)
Lymphocytes Relative: 30 % (ref 12–46)
Lymphs Abs: 1.4 10*3/uL (ref 0.7–4.0)
Lymphs Abs: 3.1 10*3/uL (ref 0.7–4.0)
Monocytes Absolute: 1 10*3/uL (ref 0.1–1.0)
Monocytes Absolute: 1.4 10*3/uL — ABNORMAL HIGH (ref 0.1–1.0)
Monocytes Relative: 15 % — ABNORMAL HIGH (ref 3–12)
Neutrophils Relative %: 57 % (ref 43–77)

## 2010-09-30 LAB — POCT I-STAT, CHEM 8
BUN: 21 mg/dL (ref 6–23)
Calcium, Ion: 1.1 mmol/L — ABNORMAL LOW (ref 1.12–1.32)
Creatinine, Ser: 1.3 mg/dL (ref 0.4–1.5)
Hemoglobin: 16.7 g/dL (ref 13.0–17.0)
Potassium: 4 mEq/L (ref 3.5–5.1)

## 2010-09-30 LAB — PROTIME-INR: INR: 1 (ref 0.00–1.49)

## 2010-09-30 LAB — SYNOVIAL CELL COUNT + DIFF, W/ CRYSTALS: Monocyte-Macrophage-Synovial Fluid: 9 % — ABNORMAL LOW (ref 50–90)

## 2010-09-30 LAB — BRAIN NATRIURETIC PEPTIDE
Pro B Natriuretic peptide (BNP): 336 pg/mL — ABNORMAL HIGH (ref 0.0–100.0)
Pro B Natriuretic peptide (BNP): 365 pg/mL — ABNORMAL HIGH (ref 0.0–100.0)

## 2010-09-30 LAB — PLATELET COUNT: Platelets: 147 10*3/uL — ABNORMAL LOW (ref 150–400)

## 2010-09-30 LAB — POCT CARDIAC MARKERS
Myoglobin, poc: 102 ng/mL (ref 12–200)
Troponin i, poc: <0.05 ng/mL (ref 0.00–0.09)

## 2010-09-30 LAB — TROPONIN I: Troponin I: 28.35 ng/mL (ref 0.00–0.06)

## 2010-09-30 LAB — CARDIAC PANEL(CRET KIN+CKTOT+MB+TROPI)
Total CK: 729 U/L — ABNORMAL HIGH (ref 7–232)
Troponin I: 17.93 ng/mL (ref 0.00–0.06)

## 2010-09-30 LAB — HEMOGLOBIN A1C: Mean Plasma Glucose: 120 mg/dL

## 2010-09-30 LAB — BODY FLUID CULTURE: Culture: NO GROWTH

## 2010-09-30 LAB — CULTURE, BLOOD (ROUTINE X 2): Culture: NO GROWTH

## 2010-11-02 NOTE — Cardiovascular Report (Signed)
NAMEEARLY, STEEL NO.:  [id number]   MEDICAL RECORD NO.:  [id number]           PATIENT TYPE:   LOCATION:                                 FACILITY:   PHYSICIAN:  Nicki Guadalajara, M.D.     DATE OF BIRTH:  09/26/1945   DATE OF PROCEDURE:  DATE OF DISCHARGE:                            CARDIAC CATHETERIZATION   PROCEDURE:  Emergent cardiac catheterization:  Temporary pacemaker  insertion, multiple defibrillations for ventricular fibrillation and  ventricular tachycardia, coronary angiography, left ventriculography,  thrombectomy, percutaneous coronary intervention with PTCA and stenting  of a totally occluded proximal left circumflex coronary artery and PTCA  of a distal circumflex system.   INDICATIONS:  Mr. Efrem Pitstick is a 65 year old gentleman without known  prior cardiac disease.  He apparently developed intermittent chest pain  this morning at approximately 9:30.  He presented apparently to [facility].  His initial ECG did suggest hyperacute T-waves  inferolaterally with T-wave, one in L and precordially.  A subsequent  ECG done at [facility] in the ER later showed significant ST-  segment elevation of approximately 6 mm inferolaterally with more  pronounced T-wave inversion in leads I and L and precordial E.  A Code  STEMI was called, and the patient was transported emergently from [facility] to [facility] where he was seen  by me in the catheterization laboratory for acute intervention.   PROCEDURE:  Upon arrival to the [facility], the patient was  still having severe chest pain.  He had greater than 6 mm of ST-segment  elevation.  In addition, he was felt to be having some pauses.  Right  femoral artery was punctured anteriorly, and a 6-French sheath was  inserted.  In addition, a 7-French sheath was inserted in the venous  system in the right femoral vein, and temporary pacemaker was  inserted  prophylactically in anticipation of potential high risk for  arrhythmia/bradycardia with this high-risk inferolateral MI.  There was  excellent capture and thresholds.  As the 6-French diagnostic left  catheter was being inserted and was still in the descending thoracic  aorta, the patient developed ventricular fibrillation.  He immediately  was shocked with restoration of sinus rhythm; however, seconds later he  had recurrent VF and again was shocked immediately with restoration of  sinus rhythm.  He was given initial IV amiodarone 150 mg bolus.  He also  received 2.5 mg intravenous Lopressor.  The diagnostic catheter was then  advanced and injection into the left coronary system was performed which  demonstrated total occlusion of the proximal circumflex representing the  acute infarct vessel.  The patient then had another episode of VF,  requiring shock.  He received an additional 150 mg of amiodarone and was  started on the amiodarone drip.  At this time, his blood pressure was  approximately 50-60 mm and dopamine was started.  The IV fluids were  made wide open.  They required increasing dopamine titration for  improvement in pressure.  Attention was then directed at  the right  coronary artery.  Diagnostic catheterization was done with a 5-French FR-  4 catheter which showed subtotal occlusion, but this looked to be old  and not acute in a nondominant RCA system.  A 4.0 x 6-French left guide  was used for the intervention to the circumflex vessel.  Initially, an  Asahi medium wire was advanced into the circumflex, but by itself this  was unable to cross the total occlusion.  Consequently, a 2.5- x 15-mm  apex balloon was inserted for support, but again this was still  unsuccessful, and the lesion was not able to be crossed.  The wire and  balloon were then removed.  A Prowater was then advanced.  By itself,  this was unable to cross the occlusion and also when the 2.5-mm  balloon  was inserted this also was still unable to cross the occlusion.  The 2.5-  mm balloon was then removed and a 1.5-mm apex balloon was advanced over  the Prowater wire, and with this balloon the Prowater wire was able to  cross the total occlusion.  This revealed a dominant left circumflex  system with resumption of flow, but again the patient developed  reperfusion arrhythmia and had several episodes of VF requiring multiple  defibrillations.  He also received additional IV Lopressor 2.5 mg x2.  It was apparent that there was significant clot burden within this large  dominant circumflex system.  A Fetch thrombectomy catheter was then  inserted and advanced distally since there was subtotal occlusion  distally as well beyond the PDA.  Several runs were made with the Fetch  thrombectomy catheter.  After the first run, the patient did have  another episode of VF, again required defibrillation.  Significant  thrombus burden was removed from the Fetch catheter.  IC nitroglycerin  was administered 100 mcg.  A 2.5-mm balloon was then reinserted, and  dilatations were made at the site of previous total proximal occlusion.  A 3.5- x 23-mm XIENCE V stent was then inserted and successfully  deployed and dilated up to 14 atmospheres corresponding to 3.8 mm.  There was now brisk TIMI III flow, but flow close was still somewhat  sluggish in the PLA system due to diffusely narrowed PLA vessel  proximally and subtotaled at the and mid segment.  The 1.5-mm balloon  was then advanced and multiple dilatations were made in this PLA vessel.  A 2.0- x 20-mm balloon was then inserted, and again multiple dilatations  were made proximally.  This 2-0 balloon was then able to cross the  subtotal more distal lesion in this small-caliber vessel.  Consequently,  a 1.5 new apex balloon was then inserted and this was able to have low-  level inflation made at the site but with recoil.  The patient under one   other time had an episode now of monomorphic VT which was treated with  cardioversion.  He received additional IV Lopressor.  In addition, at  the start of the intervention since he did have significant thrombus  burden identified, he also was given internal and, and due to his acute  ST-segment-elevation MI he was administered prasugrel 60 mg for  antiplatelet therapy instead of Plavix.  Poststent dilatation was made  with a 4.0- x 20-mm noncompliant Voyager balloon within the previously  placed stented segment.  With the demonstration of brisk TIMI III flow  and an excellent angiographic result proximally although there was still  distal disease in the PLA system which  was small caliber, the decision  was made not to continue attempt at intervention at this site presently.  A pigtail catheter was then inserted, and biplane cine left  ventriculography was performed.  The patient left the catheterization  laboratory with stable hemodynamics with resolution of prior chest pain  on increased IV fluids, IV amiodarone drip, IV Integrilin, and continued  to have bivalirudin which was started at the beginning of the procedure  with plans to continue the bivalirudin until the bag was completed.   HEMODYNAMIC DATA:  The central aortic pressure was 96/66.  At the end of  the procedure, central aortic pressure was 108/74.  Left ventricular  pressure was 106/15.  During the procedure, the patient's blood pressure  drop to 60 mm for which he was treated with intravenous dopamine as high  as 15 mL.  Ultimately, this was able to be tapered down to 4 when he  left the laboratory.  In addition, during the procedure when the patient  was hypotensive with recurrent VF, he did have significant oxygen  desaturation and was treated with 100% non-rebreather mask with  improvement in oxygen saturation into the 90s.   ANGIOGRAPHIC DATA:  Left main coronary artery was a short vessel which  bifurcated into an LAD  and left circumflex system.   The LAD was a moderate-sized vessel that gave rise to one major diagonal  vessel and septal perforating arteries.  There was mild luminal  irregularity distally without significant high-grade stenoses.   The circumflex vessel gave rise to a high marginal vessel which was  normal.  The proximal circumflex was then totally occluded with TIMI 0  flow.  The right coronary artery was a nondominant vessel that had  diffuse 80% proximal stenosis, 90% mid stenosis, and was 99% stenosed.  Again, this was nondominant and small vessel.   Following percutaneous coronary intervention with PTCA of the proximal  circumflex, Fetch thrombectomy throughout the circumflex system with  removal of extensive thrombus, and PTCA of the distal circumflex PLA  vessel had multiple sites.  There was resumption of TIMI III flow.  The  site of 100% occlusion was ultimately stented with a 3.5- x 23-mm XIENCE  V stent postdilated up to 3.88 mm.  The 100% occlusion was reduced to 0%  with brisk TIMI III flow.  The subtotaled diffuse PLA stenosis after the  PDA takeoff was successfully dilated and had residual narrowing of  approximately 40%.  There was still distal PLA narrowing of 90% focally  in a very calcified segment which was not fully able to be crossed, but  again now there was TIMI III flow beyond this segment.   IMPRESSION:  1. Acute high-risk inferolateral myocardial infarction secondary to      total proximal occlusion of a large dominant left circumflex      coronary artery.  2. Multiple episodes of ventricular fibrillation requiring successful      defibrillation and one episode of ventricular tachycardia.  3. Hypotension requiring dopamine.  4. Two-vessel coronary artery disease with total occlusion of the      proximal dominant circumflex coronary artery and subtotal occlusion      diffusely of a small nondominant right coronary artery.  5. Difficult but successful  percutaneous coronary prevention of the      100% occluded circumflex system with percutaneous transluminal      coronary angioplasty and ultimate stenting and insertion of a 3.5-      x 23-mm XIENCE  V stent postdilated to 3.88 mm, utilization of Fetch      thrombectomy with removal of significant thrombus burden, and      diffuse percutaneous transluminal coronary angioplasty of diffusely      diseased distal posterolateral artery vessel done with an Angiomax,      60 mg of prasugrel, double-bolus Integrilin, and anticoagulation.           ______________________________  Nicki Guadalajara, M.D.     TK/MEDQ  D:  09/12/2008  T:  09/13/2008  Job:  [id number]

## 2010-11-02 NOTE — Op Note (Signed)
NAMERODGERICK, Jesus Jordan                  ACCOUNT NO.:  [id number]   MEDICAL RECORD NO.:  [id number]          PATIENT TYPE:  INP   LOCATION:  2904                         FACILITY:  [facility]   PHYSICIAN:  Eulas Post, MD    DATE OF BIRTH:  07-31-1945   DATE OF PROCEDURE:  09/16/2008  DATE OF DISCHARGE:                               OPERATIVE REPORT   PROCEDURE NOTE   PREPROCEDURE DIAGNOSES:  Left knee effusion and left ankle effusion.   POSTPROCEDURE DIAGNOSES:  Left knee aspiration and injection with  attempted left ankle aspiration and injection.   ATTENDING SURGEON:  Eulas Post, MD   PREPROCEDURE INDICATIONS:  [name] is a 65 year old man with a  history of gout who complained of 3 days of increasing swelling and pain  in the left ankle and knee.  He elected to undergo the above named  procedures.  The risks, benefits, and alternatives were discussed.   PROCEDURE:  After sterile prep with alcohol, Xylocaine was utilized to  numb the skin.  An 18-gauge needle was then used to aspirate a total of  approximately 50 mL of joint fluid.  There did not appear to be any  purulence.  We sent fluid for Gram stain culture sensitivity and cell  count analysis.  The patient tolerated the procedure well.  The  superolateral portal was utilized and a Band-Aid was placed.   We then turned our attention to the ankle.  Sterile prep using alcohol  over the anterolateral portal was performed.  Xylocaine was again  injected using a 25-gauge needle.  Attempted aspiration was carried out,  but I could not obtain any significant fluid.  Therefore, Band-Aid was  placed and no fluid was sent.   The patient tolerated this procedure well and is going to be on gout  medications and we will follow up on the results of his aspiration from  his knee.      Eulas Post, MD  Electronically Signed     JPL/MEDQ  D:  09/16/2008  T:  09/17/2008  Job:  [id number]

## 2010-11-02 NOTE — Consult Note (Signed)
NAMELEWIN, PELLOW NO.:  [id number]   MEDICAL RECORD NO.:  [id number]          PATIENT TYPE:  INP   LOCATION:  2904                         FACILITY:  [facility]   PHYSICIAN:  Eulas Post, MD    DATE OF BIRTH:  [birth year]   DATE OF CONSULTATION:  09/16/2008  DATE OF DISCHARGE:                                 CONSULTATION   REQUESTING PHYSICIAN:  Nicki Guadalajara, MD   REASON FOR CONSULTATION:  Evaluate left leg swelling and pain.   CHIEF COMPLAINT:  Left knee and ankle pain.   HISTORY:  [name] is a 64 year old gentleman who was admitted  with a heart attack who has a history of gout and complains of  approximately 3 days of increasing swelling and pain in his left knee  and ankle.  He currently rates it as 10/10 and describes it as a sharp  pain like a knife is cutting into his knee.  He does have a history of  gout in the past, which has affected his great toe most of the time, and  he says this feels exactly like what happened before.   He has a family history of gout.   He smokes a half-pack per day.  Tobacco intervention session was  performed.  He is willing to quit cold [place] as of now and he does not  want to try any pills or patches at the current time.  He currently  smokes half pack per day.   REVIEW OF SYSTEMS:  Complete review of systems was performed and was  negative with the exception of his recent chest pain and heart attack,  and he also had a gout attack 6 weeks ago.  He denies any fevers or  chills.   PHYSICAL EXAMINATION:  GENERAL:  He is alert and oriented, in no acute  distress.  His left lower extremity exam has a large knee effusion.  There is warmth, but no erythema.  Range of motion is limited by pain.  His ankle also feels somewhat warm and swollen with no erythema.   His uric acid was 7.0 and his white count was 9.8.   IMPRESSION:  Left knee effusion and ankle effusion with probable gout.   PLAN:  I have offered him  a diagnostic and potentially therapy injection  to both the knee and his ankle.  We will attempt to perform this today  and send fluid for crystals as well as Gram stain culture and  sensitivity analysis.  We  will also plan to inject Kenalog and Xylocaine.  The he is currently  taking colchicine for his gout and I will plan to follow while he is in-  house and follow up with him as an outpatient to ensure that the lower  extremity complaints resolved.      Eulas Post, MD  Electronically Signed     JPL/MEDQ  D:  09/16/2008  T:  09/17/2008  Job:  [id number]

## 2010-11-02 NOTE — H&P (Signed)
NAMEINES, WARF NO.:  [id number]   MEDICAL RECORD NO.:  [id number]          PATIENT TYPE:  INP   LOCATION:  2904                         FACILITY:  [facility]   PHYSICIAN:  Nicki Guadalajara, M.D.     DATE OF BIRTH:  07/20/1945   DATE OF ADMISSION:  09/12/2008  DATE OF DISCHARGE:                              HISTORY & PHYSICAL   Mr. Kadlec is a 65 year old divorced male patient who came to [facility] by EMS as a code STEMI.  He has no prior cardiac medical  history.  He went to [facility] Emergency Room with bilateral chest  pressure associated with mild shortness of breath and diaphoresis.  He  took aspirin 81 mg 2 at home with no improvement.  He then went to the  [facility] Emergency Room.  There his initial EKG was reviewed by the  ER physician.  He had mild ST elevation, it was rechecked with serial  EKG, and the second EKG had definite inferior ST-wave elevation.  His  point-of-care marker #1 was negative.  After his EKG was identified the  ST elevation, he was transferred to [facility].  The patient  stated that his chest pain started about 10 o'clock in the morning.   PRIOR MEDICAL HISTORY:  The patient was in pain when asking these  questions 10/10.  He denied any known chronic illnesses.  He does have  an inguinal hernia.  He has smoked tobacco for 30-40 years.  He also may  have hyperlipidemia, but he does not take any medication for this.   MEDICATIONS:  Motrin.  He does not take aspirin daily.   ALLERGIES:  NKDA.   SOCIAL HISTORY:  He is divorced.  He has a daughter that lives with him.   REVIEW OF SYSTEMS:  He denies any current illnesses such as fever, cold,  cough.  No indigestion, no black tarry stools.  No prior history of a  CVA or TIA.  No lower extremity edema.  He states he had been in good  health.  Other systems are negative.   PHYSICAL EXAMINATION:  VITAL SIGNS:  Blood pressure is 118/73, pulse was  57, temperature was  97, and respirations were 20.  General:  Currently, the patient is in acute distress with 10/10 chest  pain.  He is draped in for his cardiac catheterization and physical exam  not done at this time.   ASSESSMENT:  1. ST elevation myocardial infarction.  2. History of tobacco smoking.  3. Possible history of hyperlipidemia.   PLAN:  He was seen by Dr. Tresa Endo, and he is now for emergent cardiac  catheterization.      Lezlie Octave, N.P.    ______________________________  Nicki Guadalajara, M.D.    BB/MEDQ  D:  09/12/2008  T:  09/13/2008  Job:  [id number]

## 2010-11-02 NOTE — Discharge Summary (Signed)
Jesus Jordan, Jesus Jordan NO.:  [id number]   MEDICAL RECORD NO.:  [id number]          PATIENT TYPE:  INP   LOCATION:  2004                         FACILITY:  [facility]   PHYSICIAN:  Nicki Guadalajara, M.D.     DATE OF BIRTH:  [birth year]   DATE OF ADMISSION:  09/12/2008  DATE OF DISCHARGE:  09/19/2008                               DISCHARGE SUMMARY   DISCHARGE DIAGNOSES:  1. Acute ST-elevation myocardial infarction with emergent cardiac      catheterization requiring stenting of total circumflex with a      Xience stent and a percutaneous transluminal coronary angioplasty      of his distal posterolateral artery of his circumflex.  He had      multiple complications during the procedure including these      multiple episodes of ventricular fibrillation with multiple      defibrillations.  He also has some ventricular tachycardia.  He was      placed on amiodarone.  2. Congestive heart failure, acute, systolic, secondary to stunned      myocardium with myocardial infarction treated with diuretics.  3. Hypotension during myocardial infarction and post myocardial      infarction treated by medication titration.  He was on dopamine      during his myocardial infarction.  4. Ejection fraction by cardiac catheterization was 55%.  5. Post myocardial infarction episode of gout in his left knee and      ankle.  Orthopedic consult was called and he did have aspirations      of his knee.  6. Post myocardial infarction fever with negative blood cultures      possibly related to his gout.  7. Hyperlipidemia.  8. History of tobacco use.  The patient agrees to cessation after his      myocardial infarction.   LABORATORIES:  Point-of-care marker:  1. Showed a CK-MB 1.6 and troponin of less than 0.05.  2. 2228, MB of 164 and troponin 39.8.  3. 1770, MB of 104 and troponin 45.  4. 1589, MB of 72 and troponin of 28.  5. 729, MB of 14 and troponin of 17.   Admission hemoglobin 12.4,  hematocrit 35.6.  On September 18, 2008,  hemoglobin 12.8, hematocrit 36.1, WBCs 10.1, and platelets 205.  D-dimer  was 0.46.  BNP highest on September 17, 2008, was 620.  Blood cultures x2  were negative.  Cell count from his knee aspirate showed intracellular  monosodium urate crystals and the culture of the knee aspirate was no  growth x2 days.  TSH was 1.693, total cholesterol is 167, triglycerides  41, HDL 53, and LDL is 106; and this is on September 13, 2008.  Magnesium  was 1.7.  Chest x-Stoker on September 12, 2008, showed no active disease.  On  September 13, 2008, bilateral infiltrates.  On September 14, 2008, no change in  aeration of lungs.  On September 15, 2008, showed increased airspace  opacity, pulmonary vascular congestion, and mild pulmonary edema.  On  September 16, 2008, showed improving pulmonary edema.  On September 17, 2008,  showed pulmonary vascular congestion.  Cardiac cath on September 12, 2008,  revealed totaled circumflex, EF of 55%.  Lower extremity Dopplers  performed on September 15, 2008, showed no DVT.   HOSPITAL COURSE:  [name] is a 65 year old divorced male who was brought  to [facility] by EMS, as a code STEMI.  He had no prior cardiac  history.  He apparently had gone to [facility] with bilateral chest  pressure associated with mild shortness of breath and diaphoresis.  He  took aspirin 81 mg 2 at home with no improvement.  He went to the ER.  His initial EKG was reviewed by the ER physician.  He had mild ST  elevation.  It was rechecked with serial EKG and the second EKG had  definite inferior ST wave elevation.  His first point-of-care marker was  negative, and he was transferred to [facility] emergently.  He was  brought to the cardiac cath lab.  He was seen by Dr. Tresa Endo in the cath  lab.  He underwent emergent cardiac cath and he had multiple episodes of  V-fib prior to opening up the artery.  He also had some VT.  He had a  total circumflex, which was stented by Dr. Tresa Endo with a  Xience stent 3.5  x 23.  He also had a PTCA of his PLA of his left circumflex.  He had  multiple lesions there.  Post procedure, he had problems with  hypotension, O2 desaturation, and CHF.  He was treated with pressors of  dopamine.  He was continued on his Integrilin and also placed on heparin  after the Integrilin protocol was finished.  He was given some diuretics  for his CHF.  His medications were titrated slowly and by September 19, 2008,  day #7, of his inferior MI.  He was considered stable for discharge  home.  His blood pressure was 115/81.  He was up walking in the halls  with cardiac rehab and was not having any acute problems.   DISCHARGE MEDICATIONS:  1. Coreg 6.25 mg twice a day.  2. Inspra 25 mg a day.  3. Altace 2.5 mg daily.  4. Imdur 30 mg a day.  5. Amiodarone 400 mg a day.  6. Prasugrel 10 mg daily.  Do not stop.  7. Aspirin 325 mg a day.  8. Simvastatin 40 mg a day.  9. Protonix 40 mg a day.  10.Colchicine 0.6 mg daily.  11.Allopurinol 150 mg a day.  12.Nitroglycerin 1/150 under his tongue p.r.n. as needed for chest      pain.   FOLLOWUP:  He will see Dr. Tresa Endo in approximately 2 weeks.   He was told not to do any driving or strenuous activity, pushing,  pulling, lifting until seen by Dr. Tresa Endo.      Lezlie Octave, N.P.    ______________________________  Nicki Guadalajara, M.D.    BB/MEDQ  D:  09/19/2008  T:  09/20/2008  Job:  [id number]   cc:   [facility] [place]

## 2011-10-28 ENCOUNTER — Other Ambulatory Visit: Payer: Self-pay | Admitting: Family Medicine

## 2012-01-04 ENCOUNTER — Other Ambulatory Visit: Payer: Self-pay | Admitting: Family Medicine

## 2012-02-14 ENCOUNTER — Encounter: Payer: Self-pay | Admitting: Family Medicine

## 2012-02-14 ENCOUNTER — Ambulatory Visit (INDEPENDENT_AMBULATORY_CARE_PROVIDER_SITE_OTHER): Payer: Medicare Other | Admitting: Family Medicine

## 2012-02-14 ENCOUNTER — Other Ambulatory Visit: Payer: Self-pay | Admitting: Family Medicine

## 2012-02-14 VITALS — BP 120/84 | HR 72 | Temp 97.8°F | Resp 12 | Ht 69.0 in | Wt 192.0 lb

## 2012-02-14 DIAGNOSIS — M109 Gout, unspecified: Secondary | ICD-10-CM | POA: Diagnosis not present

## 2012-02-14 DIAGNOSIS — R21 Rash and other nonspecific skin eruption: Secondary | ICD-10-CM | POA: Diagnosis not present

## 2012-02-14 DIAGNOSIS — Z23 Encounter for immunization: Secondary | ICD-10-CM

## 2012-02-14 DIAGNOSIS — Z125 Encounter for screening for malignant neoplasm of prostate: Secondary | ICD-10-CM

## 2012-02-14 DIAGNOSIS — N400 Enlarged prostate without lower urinary tract symptoms: Secondary | ICD-10-CM | POA: Diagnosis not present

## 2012-02-14 DIAGNOSIS — Z0000 Encounter for general adult medical examination without abnormal findings: Secondary | ICD-10-CM | POA: Diagnosis not present

## 2012-02-14 DIAGNOSIS — I2510 Atherosclerotic heart disease of native coronary artery without angina pectoris: Secondary | ICD-10-CM

## 2012-02-14 LAB — BASIC METABOLIC PANEL
BUN: 18 mg/dL (ref 6–23)
Chloride: 103 mEq/L (ref 96–112)
Potassium: 5.3 mEq/L — ABNORMAL HIGH (ref 3.5–5.1)

## 2012-02-14 LAB — HEPATIC FUNCTION PANEL
Albumin: 4.3 g/dL (ref 3.5–5.2)
Bilirubin, Direct: 0.2 mg/dL (ref 0.0–0.3)
Total Protein: 7.4 g/dL (ref 6.0–8.3)

## 2012-02-14 LAB — LIPID PANEL
Cholesterol: 175 mg/dL (ref 0–200)
LDL Cholesterol: 84 mg/dL (ref 0–99)
VLDL: 24.4 mg/dL (ref 0.0–40.0)

## 2012-02-14 LAB — PSA: PSA: 0.37 ng/mL (ref 0.10–4.00)

## 2012-02-14 MED ORDER — ZOSTER VACCINE LIVE 19400 UNT/0.65ML ~~LOC~~ SOLR: 0.6500 mL | Freq: Once | SUBCUTANEOUS | Status: DC

## 2012-02-14 MED ORDER — DESOXIMETASONE 0.25 % EX CREA: TOPICAL_CREAM | Freq: Two times a day (BID) | CUTANEOUS | Status: AC

## 2012-02-14 NOTE — Patient Instructions (Addendum)
Remember yearly flu vaccine Touch base if finger rash not improving over the next couple of weeks

## 2012-02-14 NOTE — Progress Notes (Signed)
Subjective:    Patient ID: Jesus Jordan, male    DOB: 10-08-1945, 66 y.o.   MRN: [id number]  HPI  Patient here for medical followup and welcome to Medicare exam. He has history of coronary artery disease with circumflex stent back 09/12/2008. Followed regularly by cardiology. He has history of gout which has been prevented with allopurinol and also takes colchicine once daily. No recent flareups Patient has scheduled stress test 02/23/2012.  Tetanus and Pneumovax up-to-date. No history of shingles vaccine. Received yearly flu vaccine. Colonoscopy 2011 which is normal.  Pruritic rash left middle finger involving the sides of the finger and nail base. He has tried over-the-counter antifungal without improvement.  Past Medical History  Diagnosis Date  . GOUT 01/29/2009    Qualifier: Diagnosis of  By: [name], Cabela Pacifico    . CAD 01/29/2009    Qualifier: Diagnosis of  By: [name], Kirsty Monjaraz    . INGUINAL HERNIA 05/25/2009    Qualifier: Diagnosis of  By: [name], Kalesha Irving    . Heart disease     cardiologist [name]  . Hyperlipidemia   . Gout    Past Surgical History  Procedure Date  . Coronary stent placement 09-12-08    following heart attack  . Hernia repair 2011    left inguinal hernia    reports that he has been smoking Cigarettes.  He has smoked for the past 30 years. He does not have any smokeless tobacco history on file. His alcohol and drug histories not on file. family history includes Cancer in his paternal grandfather; Heart disease (age of onset:38) in his brother; and Hyperlipidemia in his brother. No Known Allergies  1.  Risk factors based on Past Medical , Social, and Family history reviewed and as above 2.  Limitations in physical activities plays tennis and walks for exercise regularly. Low risk for fall 3.  Depression/mood no depression or anxiety issues 4.  Hearing no deficits 5.  ADLs independent in all 6.  Cognitive function (orientation to time and place,  language, writing, speech,memory) no memory deficits. Language and judgment intact 7.  Home Safety no issues 8.  Height, weight, and visual acuity. All stable 9.  Counseling discussed exercise. 10. Recommendation of preventive services. Yearly flu vaccine. 11. Labs based on risk factors PSA, lipid panel, hepatic panel 12. Care Plan above 13.  Advanced Care Directives- Patient has Living Will.    Review of Systems  Constitutional: Negative for fever, activity change, appetite change and fatigue.  HENT: Negative for ear pain, congestion and trouble swallowing.   Eyes: Negative for pain and visual disturbance.  Respiratory: Negative for cough, shortness of breath and wheezing.   Cardiovascular: Negative for chest pain and palpitations.  Gastrointestinal: Negative for nausea, vomiting, abdominal pain, diarrhea, constipation, blood in stool, abdominal distention and rectal pain.  Genitourinary: Negative for dysuria, hematuria and testicular pain.  Musculoskeletal: Negative for joint swelling and arthralgias.  Skin: Positive for rash.  Neurological: Negative for dizziness, syncope and headaches.  Hematological: Negative for adenopathy.  Psychiatric/Behavioral: Negative for confusion and dysphoric mood.       Objective:   Physical Exam  Constitutional: He is oriented to person, place, and time. He appears well-developed and well-nourished. No distress.  HENT:  Head: Normocephalic and atraumatic.  Right Ear: External ear normal.  Left Ear: External ear normal.  Mouth/Throat: Oropharynx is clear and moist.  Eyes: Conjunctivae and EOM are normal. Pupils are equal, round, and reactive to light.  Neck: Normal  range of motion. Neck supple. No thyromegaly present.  Cardiovascular: Normal rate, regular rhythm and normal heart sounds.   No murmur heard. Pulmonary/Chest: No respiratory distress. He has no wheezes. He has no rales.  Abdominal: Soft. Bowel sounds are normal. He exhibits no  distension and no mass. There is no tenderness. There is no rebound and no guarding.  Genitourinary: Rectum normal.       Prostate slightly enlarged but smooth and no nodules or asymmetry.  Musculoskeletal: He exhibits no edema.  Lymphadenopathy:    He has no cervical adenopathy.  Neurological: He is alert and oriented to person, place, and time. He displays normal reflexes. No cranial nerve deficit.  Skin: Rash noted.       Left middle finger reveals nonspecific erythematous slightly scaly rash involving the dorsal distal aspect just proximal to the nail matrix region and slightly involving side of the digit  Psychiatric: He has a normal mood and affect.          Assessment & Plan:  #1 health maintenance. Continue yearly flu vaccine. Check on coverage for shingles vaccine. Other vaccines up to date. Recent colonoscopy normal. Check PSA after discussion with patient regarding pros and cons #2 gout which is stable. Continue current medications  #3 history of CAD. Check lipid and hepatic panel  #4 rash involving left middle finger. Has tried over-the-counter antifungals without improvement. Question dyshidrotic eczema. Topicort 0.25% cream and touch base 2 weeks if no better

## 2012-02-15 NOTE — Progress Notes (Signed)
Quick Note:  Pt informed, copy mailed to home as requested, faxed result to [name], pt aware ______

## 2012-02-23 DIAGNOSIS — I252 Old myocardial infarction: Secondary | ICD-10-CM | POA: Diagnosis not present

## 2012-02-23 DIAGNOSIS — I2510 Atherosclerotic heart disease of native coronary artery without angina pectoris: Secondary | ICD-10-CM | POA: Diagnosis not present

## 2012-02-23 DIAGNOSIS — Z9289 Personal history of other medical treatment: Secondary | ICD-10-CM

## 2012-02-23 DIAGNOSIS — I10 Essential (primary) hypertension: Secondary | ICD-10-CM | POA: Diagnosis not present

## 2012-02-23 HISTORY — DX: Personal history of other medical treatment: Z92.89

## 2012-03-01 ENCOUNTER — Encounter: Payer: Self-pay | Admitting: Family Medicine

## 2012-03-16 DIAGNOSIS — I119 Hypertensive heart disease without heart failure: Secondary | ICD-10-CM | POA: Diagnosis not present

## 2012-03-16 DIAGNOSIS — E782 Mixed hyperlipidemia: Secondary | ICD-10-CM | POA: Diagnosis not present

## 2012-03-16 DIAGNOSIS — I252 Old myocardial infarction: Secondary | ICD-10-CM | POA: Diagnosis not present

## 2012-06-29 ENCOUNTER — Telehealth: Payer: Self-pay | Admitting: Family Medicine

## 2012-06-29 MED ORDER — ALLOPURINOL 300 MG PO TABS: 300.0000 mg | ORAL_TABLET | Freq: Every day | ORAL | Status: DC

## 2012-06-29 MED ORDER — METAXALONE 800 MG PO TABS: 800.0000 mg | ORAL_TABLET | ORAL | Status: DC | PRN

## 2012-06-29 MED ORDER — COLCHICINE 0.6 MG PO TABS: 0.6000 mg | ORAL_TABLET | Freq: Two times a day (BID) | ORAL | Status: DC

## 2012-06-29 NOTE — Telephone Encounter (Signed)
Pt informed on VM written Rx ready to pick-up

## 2012-06-29 NOTE — Telephone Encounter (Signed)
Patient called stating that per his insurance he will need to change to [facility] and he need written scripts for allopurinol (ZYLOPRIM) 300 MG tablet 1poqd, COLCRYS 0.6 MG tablet [[id number]], 1po bid, skelaxin 800mg  1 poqd prn. Please assist.

## 2012-10-30 ENCOUNTER — Other Ambulatory Visit: Payer: Self-pay | Admitting: *Deleted

## 2012-10-30 MED ORDER — NITROGLYCERIN 0.4 MG SL SUBL: 0.4000 mg | SUBLINGUAL_TABLET | SUBLINGUAL | Status: DC | PRN

## 2012-10-30 NOTE — Telephone Encounter (Signed)
Refill sent electronically

## 2013-01-07 ENCOUNTER — Other Ambulatory Visit: Payer: Self-pay | Admitting: Family Medicine

## 2013-01-10 DIAGNOSIS — L570 Actinic keratosis: Secondary | ICD-10-CM | POA: Diagnosis not present

## 2013-01-10 DIAGNOSIS — D235 Other benign neoplasm of skin of trunk: Secondary | ICD-10-CM | POA: Diagnosis not present

## 2013-01-10 DIAGNOSIS — L820 Inflamed seborrheic keratosis: Secondary | ICD-10-CM | POA: Diagnosis not present

## 2013-01-10 DIAGNOSIS — L219 Seborrheic dermatitis, unspecified: Secondary | ICD-10-CM | POA: Diagnosis not present

## 2013-02-12 ENCOUNTER — Other Ambulatory Visit: Payer: Self-pay | Admitting: Cardiovascular Disease

## 2013-02-12 ENCOUNTER — Other Ambulatory Visit: Payer: Self-pay | Admitting: Family Medicine

## 2013-02-28 DIAGNOSIS — L570 Actinic keratosis: Secondary | ICD-10-CM | POA: Diagnosis not present

## 2013-03-04 ENCOUNTER — Encounter: Payer: Self-pay | Admitting: *Deleted

## 2013-03-05 ENCOUNTER — Encounter: Payer: Self-pay | Admitting: Cardiovascular Disease

## 2013-03-06 ENCOUNTER — Encounter: Payer: Self-pay | Admitting: Cardiovascular Disease

## 2013-03-06 ENCOUNTER — Ambulatory Visit (INDEPENDENT_AMBULATORY_CARE_PROVIDER_SITE_OTHER): Payer: Medicare Other | Admitting: Cardiovascular Disease

## 2013-03-06 VITALS — BP 108/60 | HR 63 | Ht 70.0 in | Wt 186.0 lb

## 2013-03-06 DIAGNOSIS — E785 Hyperlipidemia, unspecified: Secondary | ICD-10-CM

## 2013-03-06 DIAGNOSIS — I2510 Atherosclerotic heart disease of native coronary artery without angina pectoris: Secondary | ICD-10-CM | POA: Diagnosis not present

## 2013-03-06 DIAGNOSIS — I252 Old myocardial infarction: Secondary | ICD-10-CM | POA: Diagnosis not present

## 2013-03-06 DIAGNOSIS — R5381 Other malaise: Secondary | ICD-10-CM

## 2013-03-06 HISTORY — DX: Hyperlipidemia, unspecified: E78.5

## 2013-03-06 LAB — COMPREHENSIVE METABOLIC PANEL
AST: 24 U/L (ref 0–37)
Albumin: 4.4 g/dL (ref 3.5–5.2)
Alkaline Phosphatase: 72 U/L (ref 39–117)
Potassium: 5.1 mEq/L (ref 3.5–5.3)
Sodium: 140 mEq/L (ref 135–145)
Total Bilirubin: 0.6 mg/dL (ref 0.3–1.2)
Total Protein: 7.4 g/dL (ref 6.0–8.3)

## 2013-03-06 NOTE — Progress Notes (Signed)
Patient ID: Jesus Jordan, male   DOB: 12-18-1945, 67 y.o.   MRN: [id number]     HPI: Jesus Jordan, is a 67 y.o. male  who presents to the office today for one-year cardiology evaluation.  The Jesus Jordan suffered a large inferolateral myocardial infarction on 09/12/2008. At that time, he presented to [facility] had recurrent episodes of ventricular fibrillation and required numerous defibrillator shocks along with CPR and I was able to successfully perform intervention to a totally occluded proximal left circumflex coronary artery. This was stented and more distally he had diffuse disease with joint and underwent PTCA at multiple sites in the very distal aspect of this dominant circumflex system. His) artery was diffusely diseased with stenoses of 90% and was nondominant. His LAD was normal. He had undergone an initial stress test in one year showed an area of scar and inferolateral wall at the mid to basal region. He subsequently was taken off his Effient and then switched to Plavix. His last nuclear perfusion study was in September 2012 which was unchanged from previously and showed the small moderate region of the mid to basal inferior inferolateral scar without associated ischemia.  Over the past year, Jesus Jordan has continued to do well. He plays tennis several days per week. He denies any recent episodes of chest pain. He has not had laboratory done in approximately a year. He does also have a history of gout. There is a history of an inguinal hernia. He sees Jesus Jordan for primary care.  Past Medical History  Diagnosis Date  . GOUT 01/29/2009    Qualifier: Diagnosis of  By: Jesus Jordan, Jesus Jordan    . CAD 01/29/2009    Qualifier: Diagnosis of  By: Jesus Jordan, Jesus Jordan    . INGUINAL HERNIA 05/25/2009    Qualifier: Diagnosis of  By: Jesus Jordan, Jesus Jordan    . Heart disease     cardiologist [name]  . Hyperlipidemia   . Gout   . History of MI (myocardial infarction) 09/13/2006    large  inferolateral MI secondary to total occlusion of circumflex  . History of nuclear stress test 02/23/2012    exercise myoview; area of scar in inferolateral wall at mid-basal region    Past Surgical History  Procedure Laterality Date  . Coronary stent placement  09/12/2008    r/t MI; 3.5x34mm Xience DES to circumflex & PTCA at multiple sites in distal region of vessel; alos diffusely diseaase, narrowed, nondominant RCA  . Hernia repair  2011    left inguinal hernia  . Foot surgery  1960  . Transthoracic echocardiogram  03/02/2009    EF=50-55%; LV systolic function borderline reduced with mild inferior wall hypokinesis; mild-mod MR; mild TR; AV mildly sclerotic; mild aortic root dilatation    No Known Allergies  Current Outpatient Prescriptions  Medication Sig Dispense Refill  . allopurinol (ZYLOPRIM) 300 MG tablet TAKE ONE TABLET BY MOUTH ONCE DAILY  30 tablet  0  . aspirin 81 MG tablet Take 81 mg by mouth daily.      . carvedilol (COREG) 12.5 MG tablet TAKE ONE TABLET BY MOUTH TWICE DAILY  60 tablet  0  . clopidogrel (PLAVIX) 75 MG tablet Take 75 mg by mouth daily. Per [name]      . colchicine (COLCRYS) 0.6 MG tablet Take 1 tablet (0.6 mg total) by mouth 2 (two) times daily.  180 tablet  1  . ezetimibe (ZETIA) 10 MG tablet Take 10 mg by mouth daily.      Marland Kitchen  fluticasone (CUTIVATE) 0.05 % cream       . isosorbide mononitrate (IMDUR) 30 MG 24 hr tablet Take 30 mg by mouth daily.      Marland Kitchen lisinopril (PRINIVIL,ZESTRIL) 10 MG tablet TAKE ONE TABLET BY MOUTH EVERY DAY  30 tablet  0  . metaxalone (SKELAXIN) 800 MG tablet Take 1 tablet (800 mg total) by mouth as needed.  30 tablet  1  . nitroGLYCERIN (NITROSTAT) 0.4 MG SL tablet Place 1 tablet (0.4 mg total) under the tongue every 5 (five) minutes as needed for chest pain.  25 tablet  3  . omeprazole (PRILOSEC) 20 MG capsule Take 20 mg by mouth as needed.      . simvastatin (ZOCOR) 40 MG tablet TAKE ONE TABLET BY MOUTH AT BEDTIME  30 tablet  0    Current Facility-Administered Medications  Medication Dose Route Frequency Provider Last Rate Last Dose  . zoster vaccine live (PF) (ZOSTAVAX) injection 19,400 Units  0.65 mL Subcutaneous Once [name], Jordan        History   Social History  . Marital Status: Single    Spouse Name: N/A    Number of Children: 3  . Years of Education: N/A   Occupational History  . Not on file.   Social History Main Topics  . Smoking status: Current Some Day Smoker -- 30 years    Types: Cigarettes  . Smokeless tobacco: Never Used  . Alcohol Use: Yes  . Drug Use: No  . Sexual Activity: Not on file   Other Topics Concern  . Not on file   Social History Narrative  . No narrative on file    Family History  Problem Relation Age of Onset  . Hyperlipidemia Brother   . Heart disease Brother 68    massive MI  . Cancer Paternal Grandfather     colon  . Colon cancer Father    Socially he is divorced. Has 3 children and 2 grandchildren. Is no tobacco use. He does drink occasional alcohol. He does play tennis.  ROS is negative for fevers, chills or night sweats. HEENT denies visual changes. Denies PND or orthopnea. He denies presyncope. There is no wheezing. He denies chest pressure. He denies change in bowel bladder habits. He denies abdominal pain. He denies claudication. He denies a recent gout flares. He denies tremors.  Other system review is negative.  PE BP 108/60  Pulse 63  Ht 5\' 10"  (1.778 m)  Wt 186 lb (84.369 kg)  BMI 26.69 kg/m2  General: Alert, oriented, no distress.  Skin: normal turgor, no rashes HEENT: Normocephalic, atraumatic. Pupils round and reactive; sclera anicteric;no lid lag.  Nose without nasal septal hypertrophy Mouth/Parynx benign; Mallinpatti scale 2/3 Neck: No JVD, no carotid briuts Lungs: clear to ausculatation and percussion; no wheezing or rales Heart: RRR, s1 s2 normal 1/6 systolic murmur Abdomen: soft, nontender; no hepatosplenomehaly, BS+;  abdominal aorta nontender and not dilated by palpation. Pulses 2+ Extremities: no clubbing cyanosis or edema, Homan's sign negative  Neurologic: grossly nonfocal  ECG: Sinus rhythm at 63 beats per minute. Normal intervals.  LABS:  BMET    Component Value Date/Time   NA 138 02/14/2012 1007   K 5.3* 02/14/2012 1007   CL 103 02/14/2012 1007   CO2 28 02/14/2012 1007   GLUCOSE 104* 02/14/2012 1007   BUN 18 02/14/2012 1007   CREATININE 1.1 02/14/2012 1007   CALCIUM 9.9 02/14/2012 1007   GFRNONAA >60 12/23/2009 1145   GFRAA  Value: >60        The eGFR has been calculated using the MDRD equation. This calculation has not been validated in all clinical situations. eGFR's persistently <60 mL/min signify possible Chronic Kidney Disease. 12/23/2009 1145     Hepatic Function Panel     Component Value Date/Time   PROT 7.4 02/14/2012 1007   ALBUMIN 4.3 02/14/2012 1007   AST 28 02/14/2012 1007   ALT 25 02/14/2012 1007   ALKPHOS 70 02/14/2012 1007   BILITOT 1.1 02/14/2012 1007   BILIDIR 0.2 02/14/2012 1007     CBC    Component Value Date/Time   WBC 6.9 12/23/2009 1145   RBC 4.78 12/23/2009 1145   HGB 16.0 12/23/2009 1145   HCT 46.7 12/23/2009 1145   PLT 180 12/23/2009 1145   MCV 97.7 12/23/2009 1145   MCH 33.4 12/23/2009 1145   MCHC 34.2 12/23/2009 1145   RDW 14.0 12/23/2009 1145   LYMPHSABS 2.1 12/23/2009 1145   MONOABS 0.8 12/23/2009 1145   EOSABS 0.4 12/23/2009 1145   BASOSABS 0.0 12/23/2009 1145     BNP    Component Value Date/Time   PROBNP 313.0* 09/19/2008 0434    Lipid Panel     Component Value Date/Time   CHOL 175 02/14/2012 1007   TRIG 122.0 02/14/2012 1007   HDL 66.90 02/14/2012 1007   CHOLHDL 3 02/14/2012 1007   VLDL 24.4 02/14/2012 1007   LDLCALC 84 02/14/2012 1007     RADIOLOGY: No results found.    ASSESSMENT AND PLAN: Mr. Shellie Goettl is now 67 years old. He is for a half years status post his VF cardiac arrest secondary to total proximal occlusion of his of a dominant left circumflex artery  artery. He is demonstrated he has been demonstrated to have significant myocardial salvage in the lateral wall but does have mid to basal inferior to inferolateral scar. He has been tolerating his medical regimen well. He is taking Zetia 10 mg and Zocor 40 mg for hyperlipidemia. He remains on dual anti-platelet therapy with 81 mg aspirin plus Plavix 75 mg. He is on ACE inhibitor with lisinopril 10 mg dose and nitrates. He is fasting today. I will check a complete set of laboratory consisting of a CBC, CMP, NMR lipoprotein, TSH level. I will contact him regarding the results and if adjustments need to be made to his medications. His last stress test was 2 years ago. I will see him in one year followup evaluation and prior to that office visit he will undergo a three-year followup exercise Myoview study.     Lennette Bihari, [place], [facility]  03/06/2013 10:02 AM

## 2013-03-06 NOTE — Patient Instructions (Addendum)
Your physician has requested that you have en exercise stress myoview. For further information please visit [email or web address] Please follow instruction sheet, as given. This will be done in September 2015.  Your physician recommends that you return for lab work fasting.  Your physician recommends that you schedule a follow-up appointment in: 1 YEAR.

## 2013-03-07 LAB — NMR LIPOPROFILE WITH LIPIDS
HDL Particle Number: 39.8 umol/L (ref >=30.5)
HDL Size: 9.8 nm (ref >=9.2)
Large HDL-P: 12.3 umol/L (ref >=4.8)
Large VLDL-P: 1.5 nmol/L (ref <=2.7)
Triglycerides: 74 mg/dL (ref <150)

## 2013-03-07 LAB — CBC
Hemoglobin: 15.4 g/dL (ref 13.0–17.0)
MCH: 32.8 pg (ref 26.0–34.0)
MCHC: 34.3 g/dL (ref 30.0–36.0)
MCV: 95.5 fL (ref 78.0–100.0)
Platelets: 193 10*3/uL (ref 150–400)

## 2013-03-07 LAB — TSH: TSH: 1.439 u[IU]/mL (ref 0.350–4.500)

## 2013-03-11 ENCOUNTER — Encounter: Payer: Self-pay | Admitting: *Deleted

## 2013-03-11 NOTE — Progress Notes (Signed)
Quick Note:  Note sent to patient ______ 

## 2013-03-13 ENCOUNTER — Other Ambulatory Visit: Payer: Self-pay | Admitting: Cardiovascular Disease

## 2013-03-13 ENCOUNTER — Other Ambulatory Visit: Payer: Self-pay | Admitting: Family Medicine

## 2013-03-13 NOTE — Telephone Encounter (Signed)
Rx was sent to pharmacy electronically. 

## 2013-04-11 ENCOUNTER — Other Ambulatory Visit: Payer: Self-pay | Admitting: Cardiovascular Disease

## 2013-04-11 ENCOUNTER — Other Ambulatory Visit: Payer: Self-pay | Admitting: Family Medicine

## 2013-04-11 NOTE — Telephone Encounter (Signed)
Rx was sent to pharmacy electronically. 

## 2013-04-30 ENCOUNTER — Ambulatory Visit (INDEPENDENT_AMBULATORY_CARE_PROVIDER_SITE_OTHER): Payer: Medicare Other

## 2013-04-30 DIAGNOSIS — Z23 Encounter for immunization: Secondary | ICD-10-CM | POA: Diagnosis not present

## 2013-05-20 ENCOUNTER — Other Ambulatory Visit: Payer: Self-pay | Admitting: Family Medicine

## 2013-05-29 ENCOUNTER — Other Ambulatory Visit: Payer: Medicare Other

## 2013-05-30 DIAGNOSIS — L820 Inflamed seborrheic keratosis: Secondary | ICD-10-CM | POA: Diagnosis not present

## 2013-05-30 DIAGNOSIS — L570 Actinic keratosis: Secondary | ICD-10-CM | POA: Diagnosis not present

## 2013-06-05 ENCOUNTER — Ambulatory Visit (INDEPENDENT_AMBULATORY_CARE_PROVIDER_SITE_OTHER): Payer: Medicare Other | Admitting: Family Medicine

## 2013-06-05 ENCOUNTER — Encounter: Payer: Medicare Other | Admitting: Family Medicine

## 2013-06-05 ENCOUNTER — Ambulatory Visit (INDEPENDENT_AMBULATORY_CARE_PROVIDER_SITE_OTHER)
Admission: RE | Admit: 2013-06-05 | Discharge: 2013-06-05 | Disposition: A | Discharge status: Home / Self Care | Payer: Medicare Other | Source: Ambulatory Visit | Attending: Family Medicine | Admitting: Family Medicine

## 2013-06-05 ENCOUNTER — Encounter: Payer: Self-pay | Admitting: Family Medicine

## 2013-06-05 VITALS — BP 110/74 | HR 66 | Temp 98.4°F | Ht 69.5 in | Wt 185.0 lb

## 2013-06-05 DIAGNOSIS — M47812 Spondylosis without myelopathy or radiculopathy, cervical region: Secondary | ICD-10-CM | POA: Diagnosis not present

## 2013-06-05 DIAGNOSIS — M542 Cervicalgia: Secondary | ICD-10-CM | POA: Diagnosis not present

## 2013-06-05 DIAGNOSIS — N320 Bladder-neck obstruction: Secondary | ICD-10-CM

## 2013-06-05 DIAGNOSIS — Z0000 Encounter for general adult medical examination without abnormal findings: Secondary | ICD-10-CM

## 2013-06-05 IMAGING — CR DG CERVICAL SPINE COMPLETE 4+V
5 series · 5 of 5 positions shown · non-contrast
Comparison: None.

CLINICAL DATA: Right neck pain

EXAM:
CERVICAL SPINE  4+ VIEWS

[view not recorded (1 of 5)]
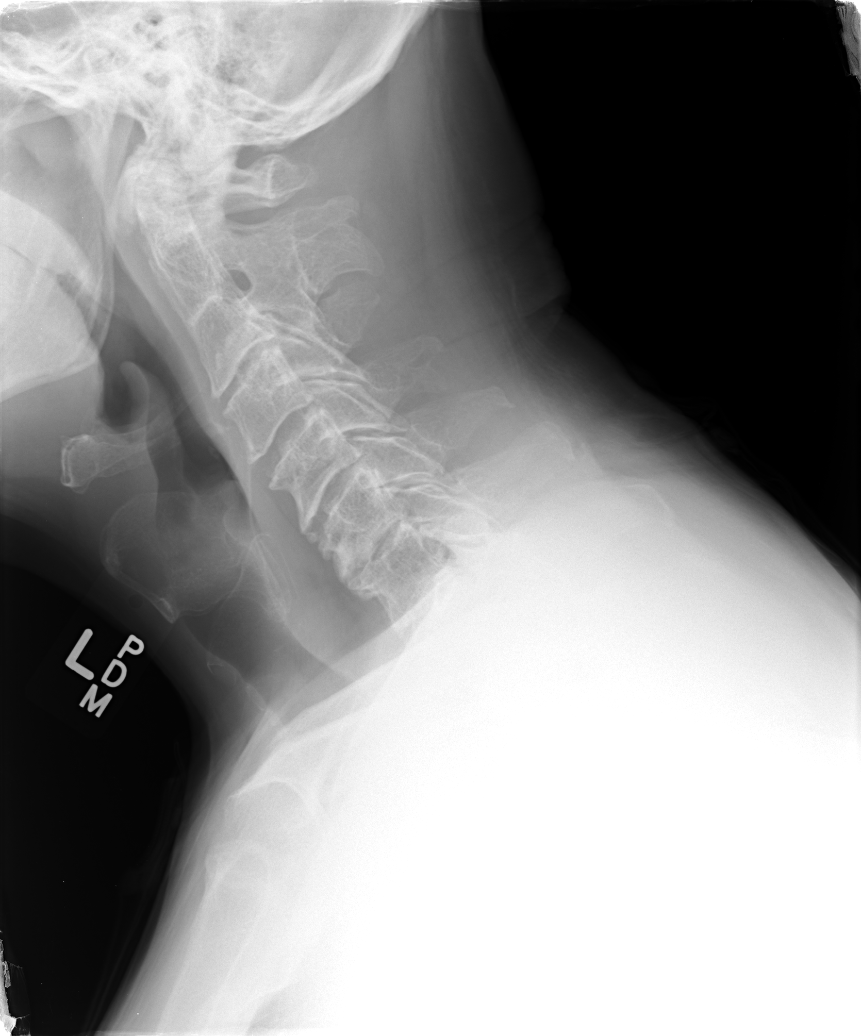

[view not recorded (2 of 5)]
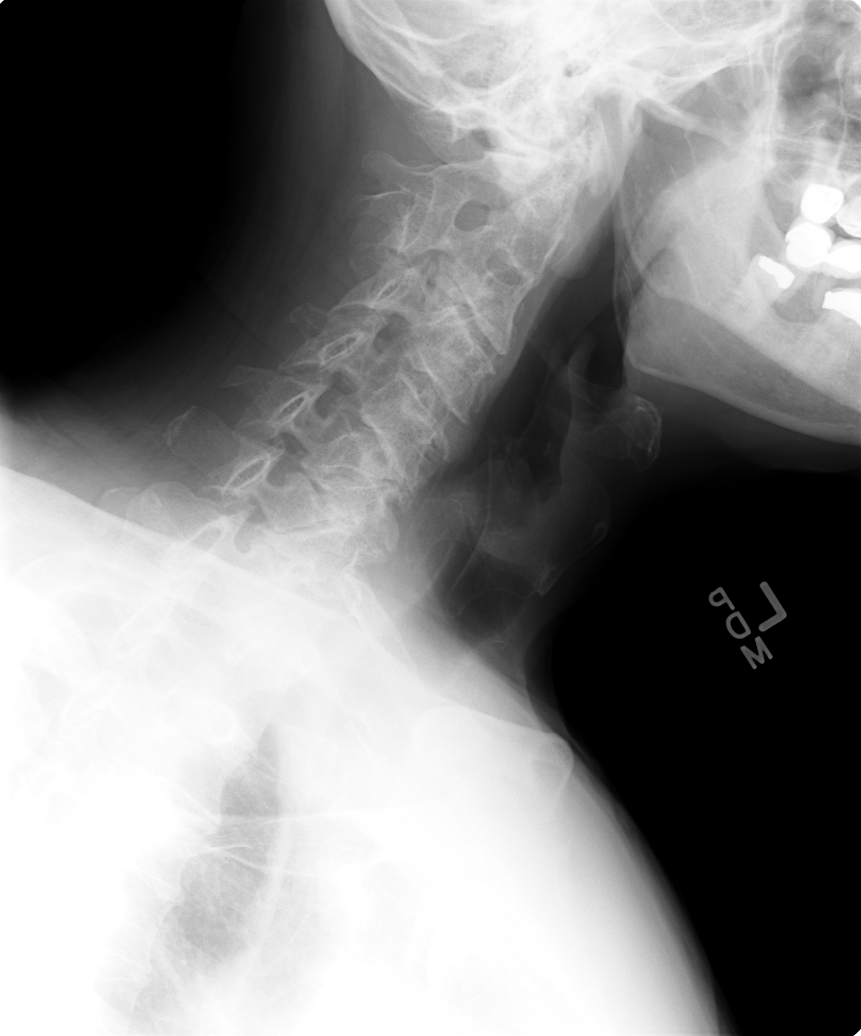

[view not recorded (3 of 5)]
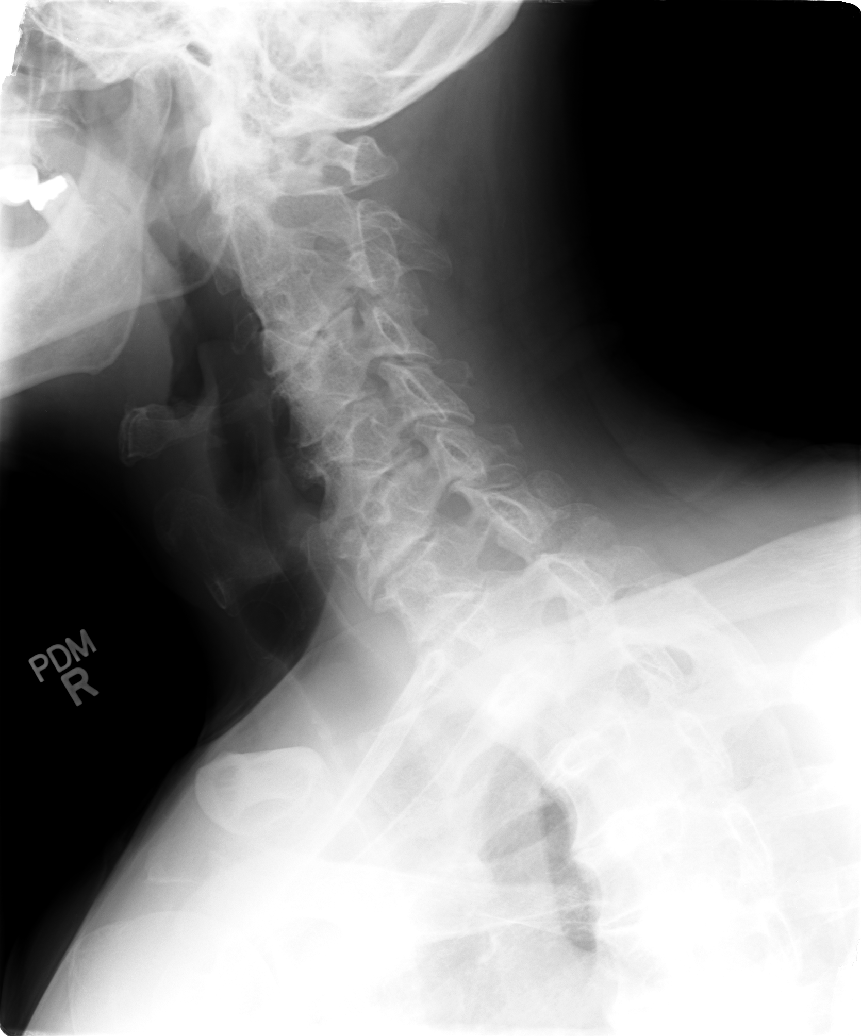

[view not recorded (4 of 5)]
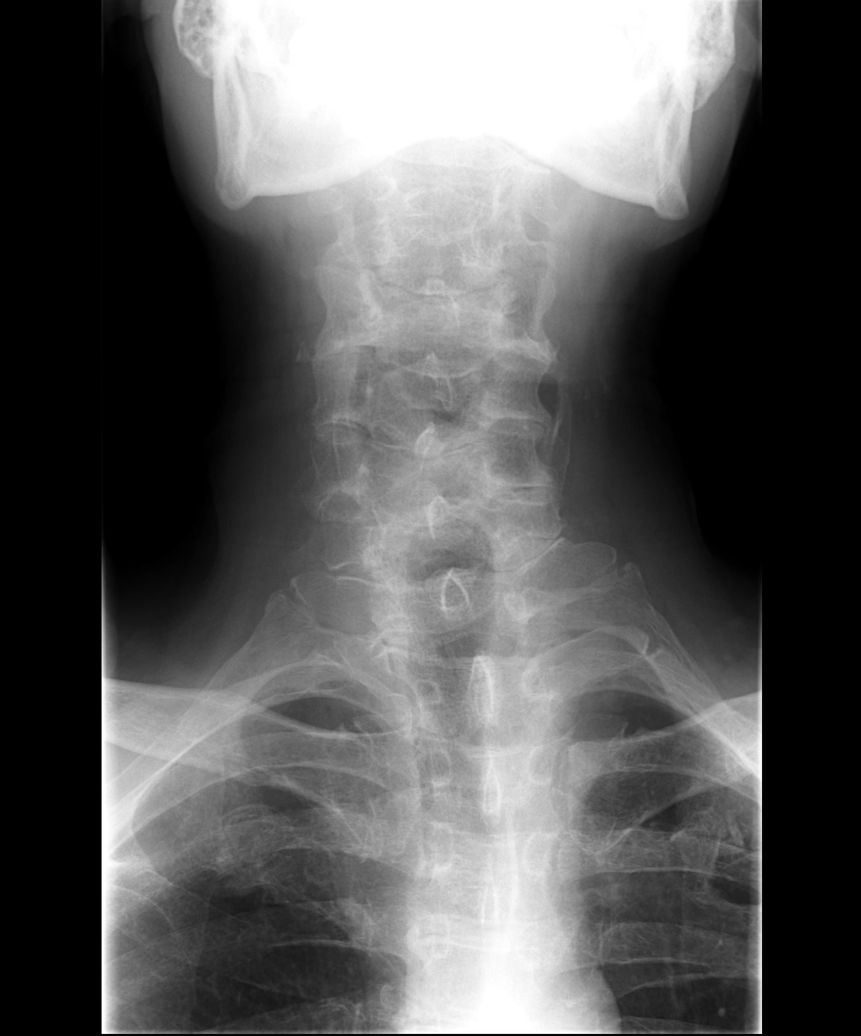

[view not recorded (5 of 5)]
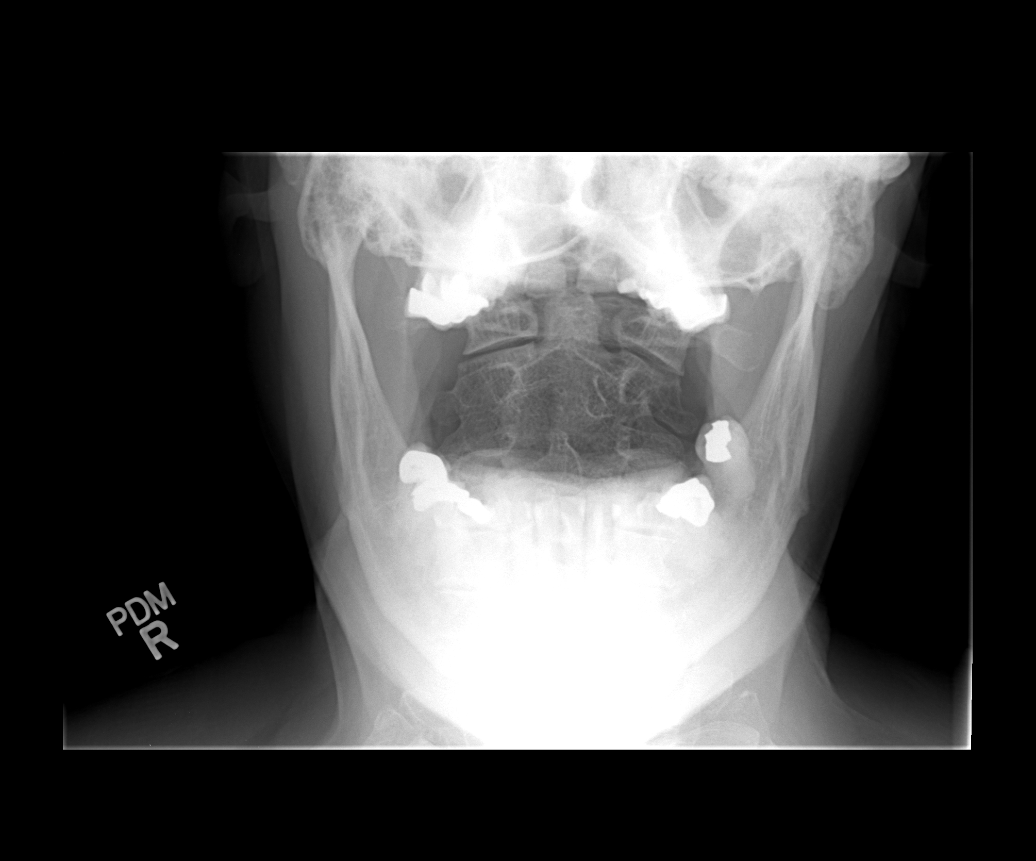

[5 of 5 positions shown; findings below may reference images not displayed]

FINDINGS: The cervical spine is visualized to the level of C7-T1.The vertebral
body heights are maintained. The alignment is normal. The
prevertebral soft tissues are normal. There is no acute fracture or
static listhesis. There is degenerative disc disease at C5-6, C6-7
and C7-T1. There is congenital fusion of the C2 and C3 vertebral
bodies as well as the posterior elements.

There is bilateral carotid artery atherosclerosis.
IMPRESSION: Mild cervical spondylosis most severe at C5-6, C6-7 and C7-T1.

## 2013-06-05 NOTE — Patient Instructions (Signed)
Try to quit smoking

## 2013-06-05 NOTE — Progress Notes (Signed)
Pre visit review using our clinic review tool, if applicable. No additional management support is needed unless otherwise documented below in the visit note. 

## 2013-06-05 NOTE — Progress Notes (Signed)
Subjective:    Patient ID: Jesus Jordan, male    DOB: 1946/03/09, 67 y.o.   MRN: [id number]  HPI  Patient here for complete physical and medical followup. He has history of CAD with MI back in 2010. Left circumflex lesion. Follow closely by cardiology.  Recent lab work unremarkable. Medications reviewed and compliant with all.  His acute issues as follows:  He relates cervical neck pain for years. He had some recent right upper extremity numbness. Progressive cervical pain especially past 3 years. He started having increased loss in range of motion. He denies any weakness. Pain is moderate. Previous x-rays at [facility] and these were over 2 years ago. He is requesting repeat x-rays to assess.  Left shoulder pains. No injury. Progressive stiffness and loss of range motion over the past couple months. No history of adhesive capsulitis. Right-hand dominant  Past Medical History  Diagnosis Date  . GOUT 01/29/2009    Qualifier: Diagnosis of  By: [name], Antionne Enrique    . CAD 01/29/2009    Qualifier: Diagnosis of  By: [name], Kenda Kloehn    . INGUINAL HERNIA 05/25/2009    Qualifier: Diagnosis of  By: [name], Ojani Berenson    . Heart disease     cardiologist [name]  . Hyperlipidemia   . Gout   . History of MI (myocardial infarction) 09/13/2006    large inferolateral MI secondary to total occlusion of circumflex  . History of nuclear stress test 02/23/2012    exercise myoview; area of scar in inferolateral wall at mid-basal region   Past Surgical History  Procedure Laterality Date  . Coronary stent placement  09/12/2008    r/t MI; 3.5x40mm Xience DES to circumflex & PTCA at multiple sites in distal region of vessel; alos diffusely diseaase, narrowed, nondominant RCA  . Hernia repair  2011    left inguinal hernia  . Foot surgery  1960  . Transthoracic echocardiogram  03/02/2009    EF=50-55%; LV systolic function borderline reduced with mild inferior wall hypokinesis; mild-mod MR; mild TR;  AV mildly sclerotic; mild aortic root dilatation    reports that he has been smoking Cigarettes.  He has been smoking about 0.00 packs per day for the past 30 years. He has never used smokeless tobacco. He reports that he drinks alcohol. He reports that he does not use illicit drugs. family history includes Cancer in his paternal grandfather; Colon cancer in his father; Heart disease (age of onset: 11) in his brother; Hyperlipidemia in his brother. No Known Allergies   1.  Risk factors based on Past Medical , Social, and Family history reviewed and as indicated above 2.  Limitations in physical activities stays very plays tennis 2 times for week  3.  Depression/mood no depression or anxiety issues 4.  Hearing chronic mild loss-chronic. 5.  ADLs independent in all 6.  Cognitive function (orientation to time and place, language, writing, speech,memory) no memory deficits. No language or judgment deficit. 7.  Home Safety no issues 8.  Height, weight, and visual acuity. All stable 9.  Counseling discussed preventative issues. We discussed smoking cessation 10. Recommendation of preventive services. Flu vaccine are given. Otheris up-to-date. Smoking cessation discussed. Colonoscopy up to date. 11. Labs based on risk factors- PSA 12. Care Plan as above.     Review of Systems  Constitutional: Negative for fever, activity change, appetite change and fatigue.  HENT: Negative for congestion, ear pain and trouble swallowing.   Eyes: Negative for  pain and visual disturbance.  Respiratory: Negative for cough, shortness of breath and wheezing.   Cardiovascular: Negative for chest pain and palpitations.  Gastrointestinal: Negative for nausea, vomiting, abdominal pain, diarrhea, constipation, blood in stool, abdominal distention and rectal pain.  Genitourinary: Negative for dysuria, hematuria and testicular pain.  Musculoskeletal: Positive for neck pain. Negative for joint swelling.  Skin: Negative  for rash.  Neurological: Positive for numbness. Negative for dizziness, syncope and headaches.  Hematological: Negative for adenopathy.  Psychiatric/Behavioral: Negative for confusion and dysphoric mood.       Objective:   Physical Exam  Constitutional: He is oriented to person, place, and time. He appears well-developed and well-nourished. No distress.  HENT:  Head: Normocephalic and atraumatic.  Right Ear: External ear normal.  Left Ear: External ear normal.  Mouth/Throat: Oropharynx is clear and moist.  Eyes: Conjunctivae and EOM are normal. Pupils are equal, round, and reactive to light.  Neck: Normal range of motion. Neck supple. No thyromegaly present.  Cardiovascular: Normal rate, regular rhythm and normal heart sounds.   No murmur heard. Pulmonary/Chest: No respiratory distress. He has no wheezes. He has no rales.  Abdominal: Soft. Bowel sounds are normal. He exhibits no distension and no mass. There is no tenderness. There is no rebound and no guarding.  Musculoskeletal: He exhibits no edema.  Patient has restricted range of motion left shoulder with no abduction greater than 100 secondary to pain and stiffness. No localized tenderness left shoulder. No a.c. joint tenderness. No biceps tenderness. No muscle atrophy.  Lymphadenopathy:    He has no cervical adenopathy.  Neurological: He is alert and oriented to person, place, and time. He displays normal reflexes. No cranial nerve deficit.  Skin: No rash noted.  Psychiatric: He has a normal mood and affect.          Assessment & Plan:  #1 health maintenance. Discussed smoking cessation. Check PSA.  Other recent labs per cardiology and those were reviewed. #2 probable early adhesive capsulitis left shoulder. Consider trial physical therapy. May need corticosteroid injection to help #3 chronic progressive cervicalgia. Recent new symptoms right upper extremity numbness. Plain cervical spine films to start and consider  physical therapy.

## 2013-06-06 ENCOUNTER — Other Ambulatory Visit: Payer: Self-pay | Admitting: Family Medicine

## 2013-06-06 DIAGNOSIS — M542 Cervicalgia: Secondary | ICD-10-CM

## 2013-06-06 DIAGNOSIS — M25512 Pain in left shoulder: Secondary | ICD-10-CM

## 2013-06-10 ENCOUNTER — Other Ambulatory Visit: Payer: Self-pay | Admitting: Family Medicine

## 2013-06-25 ENCOUNTER — Ambulatory Visit: Payer: Medicare Other | Attending: Family Medicine

## 2013-06-25 DIAGNOSIS — M542 Cervicalgia: Secondary | ICD-10-CM | POA: Insufficient documentation

## 2013-06-25 DIAGNOSIS — IMO0001 Reserved for inherently not codable concepts without codable children: Secondary | ICD-10-CM | POA: Diagnosis not present

## 2013-06-25 DIAGNOSIS — M25519 Pain in unspecified shoulder: Secondary | ICD-10-CM | POA: Insufficient documentation

## 2013-06-25 DIAGNOSIS — R5381 Other malaise: Secondary | ICD-10-CM | POA: Diagnosis not present

## 2013-06-27 ENCOUNTER — Ambulatory Visit: Payer: Medicare Other | Admitting: Physical Therapy

## 2013-07-02 ENCOUNTER — Ambulatory Visit: Payer: Medicare Other

## 2013-07-04 ENCOUNTER — Ambulatory Visit: Payer: Medicare Other

## 2013-07-09 ENCOUNTER — Ambulatory Visit: Payer: Medicare Other

## 2013-07-11 ENCOUNTER — Ambulatory Visit: Payer: Medicare Other

## 2013-07-16 ENCOUNTER — Ambulatory Visit: Payer: Medicare Other

## 2013-07-18 ENCOUNTER — Ambulatory Visit: Payer: Medicare Other

## 2013-07-23 ENCOUNTER — Ambulatory Visit: Payer: Medicare Other | Attending: Family Medicine | Admitting: Physical Therapy

## 2013-07-23 DIAGNOSIS — M25519 Pain in unspecified shoulder: Secondary | ICD-10-CM | POA: Diagnosis not present

## 2013-07-23 DIAGNOSIS — R5381 Other malaise: Secondary | ICD-10-CM | POA: Insufficient documentation

## 2013-07-23 DIAGNOSIS — M542 Cervicalgia: Secondary | ICD-10-CM | POA: Insufficient documentation

## 2013-07-23 DIAGNOSIS — IMO0001 Reserved for inherently not codable concepts without codable children: Secondary | ICD-10-CM | POA: Diagnosis not present

## 2013-07-25 ENCOUNTER — Ambulatory Visit: Payer: Medicare Other | Admitting: Physical Therapy

## 2013-07-25 DIAGNOSIS — R5381 Other malaise: Secondary | ICD-10-CM | POA: Diagnosis not present

## 2013-07-25 DIAGNOSIS — IMO0001 Reserved for inherently not codable concepts without codable children: Secondary | ICD-10-CM | POA: Diagnosis not present

## 2013-07-25 DIAGNOSIS — M25519 Pain in unspecified shoulder: Secondary | ICD-10-CM | POA: Diagnosis not present

## 2013-07-25 DIAGNOSIS — M542 Cervicalgia: Secondary | ICD-10-CM | POA: Diagnosis not present

## 2013-11-07 ENCOUNTER — Ambulatory Visit (INDEPENDENT_AMBULATORY_CARE_PROVIDER_SITE_OTHER): Payer: Medicare Other | Admitting: Family Medicine

## 2013-11-07 ENCOUNTER — Telehealth: Payer: Self-pay | Admitting: Family Medicine

## 2013-11-07 ENCOUNTER — Encounter: Payer: Self-pay | Admitting: Family Medicine

## 2013-11-07 VITALS — BP 124/76 | HR 66 | Temp 98.3°F | Wt 179.0 lb

## 2013-11-07 DIAGNOSIS — B356 Tinea cruris: Secondary | ICD-10-CM | POA: Diagnosis not present

## 2013-11-07 MED ORDER — NYSTATIN 100000 UNIT/GM EX CREA: 1.0000 "application " | TOPICAL_CREAM | Freq: Two times a day (BID) | CUTANEOUS | Status: DC

## 2013-11-07 NOTE — Progress Notes (Signed)
Pre visit review using our clinic review tool, if applicable. No additional management support is needed unless otherwise documented below in the visit note. 

## 2013-11-07 NOTE — Progress Notes (Signed)
   Subjective:    Patient ID: Jesus Jordan, male    DOB: [birth year], 68 y.o.   MRN: [id number]  Rash Pertinent negatives include no fever.   Acute visit for rash in his scrotal area and groin region bilaterally. Present for 2 months. He's had some mild itching and burning. He tried some type of steroid cream without improvement. No recent change of soaps or detergents. No other rash noted. No exacerbating factors. No alleviating factors.   Review of Systems  Constitutional: Negative for fever and chills.  Skin: Positive for rash.       Objective:   Physical Exam  Constitutional: He appears well-developed and well-nourished.  Cardiovascular: Normal rate and regular rhythm.   Pulmonary/Chest: Effort normal and breath sounds normal. No respiratory distress. He has no wheezes. He has no rales.  Skin: Rash noted.  Minimal erythema along the anterior scrotal region and minimal erythema groin region bilaterally. Non-scaly. No pustules. No vesicles.          Assessment & Plan:  Probable tenia cruris. Keep area dry. Nystatin cream twice daily. Leave off all other topicals. Touch base 2 weeks if no better

## 2013-11-07 NOTE — Patient Instructions (Signed)
Jock Itch Jock itch is a fungal infection of the skin in the groin area. It is sometimes called "ringworm" even though it is not caused by a worm. A fungus is a type of germ that thrives in dark, damp places.  CAUSES  This infection may spread from:  A fungus infection elsewhere on the body (such as athlete's foot).  Sharing towels or clothing. This infection is more common in:  Hot, humid climates.  People who wear tight-fitting clothing or wet bathing suits for long periods of time.  Athletes.  Overweight people.  People with diabetes. SYMPTOMS  Jock itch causes the following symptoms:  Red, pink or brown rash in the groin. Rash may spread to the thighs, anus, and buttocks.  Itching. DIAGNOSIS  Your caregiver may make the diagnosis by looking at the rash. Sometimes a skin scraping will be sent to test for fungus. Testing can be done either by looking under the microscope or by doing a culture (test to try to grow the fungus). A culture can take up to 2 weeks to come back. TREATMENT  Jock itch may be treated with:  Skin cream or ointment to kill fungus.  Medicine by mouth to kill fungus.  Skin cream or ointment to calm the itching.  Compresses or medicated powders to dry the infected skin. HOME CARE INSTRUCTIONS   Be sure to treat the rash completely. Follow your caregiver's instructions. It can take a couple of weeks to treat. If you do not treat the infection long enough, the rash can come back.  Wear loose-fitting clothing.  Men should wear cotton boxer shorts.  Women should wear cotton underwear.  Avoid hot baths.  Dry the groin area well after bathing. SEEK MEDICAL CARE IF:   Your rash is worse.  Your rash is spreading.  Your rash returns after treatment is finished.  Your rash is not gone in 4 weeks. Fungal infections are slow to respond to treatment. Some redness may remain for several weeks after the fungus is gone. SEEK IMMEDIATE MEDICAL CARE  IF:  The area becomes red, warm, tender, and swollen.  You have a fever. Document Released: 05/27/2002 Document Revised: 08/29/2011 Document Reviewed: 04/25/2008 ExitCare Patient Information 2014 [employer].  

## 2013-11-07 NOTE — Telephone Encounter (Signed)
Relevant patient education mailed to patient.  

## 2013-11-15 ENCOUNTER — Other Ambulatory Visit: Payer: Self-pay | Admitting: Cardiovascular Disease

## 2013-11-15 NOTE — Telephone Encounter (Signed)
Rx was sent to pharmacy electronically. Omeprazole entered in error.

## 2013-11-23 ENCOUNTER — Other Ambulatory Visit: Payer: Self-pay | Admitting: Cardiovascular Disease

## 2013-11-25 NOTE — Telephone Encounter (Signed)
Rx was sent to pharmacy electronically. 

## 2013-12-31 ENCOUNTER — Encounter (HOSPITAL_COMMUNITY): Payer: Self-pay | Admitting: *Deleted

## 2013-12-31 ENCOUNTER — Other Ambulatory Visit (HOSPITAL_COMMUNITY): Payer: Self-pay | Admitting: Cardiovascular Disease

## 2013-12-31 DIAGNOSIS — I2510 Atherosclerotic heart disease of native coronary artery without angina pectoris: Secondary | ICD-10-CM

## 2014-01-11 ENCOUNTER — Other Ambulatory Visit: Payer: Self-pay | Admitting: Cardiovascular Disease

## 2014-01-14 NOTE — Telephone Encounter (Signed)
Rx refill sent to patient pharmacy   

## 2014-02-27 ENCOUNTER — Telehealth (HOSPITAL_COMMUNITY): Payer: Self-pay

## 2014-02-27 NOTE — Telephone Encounter (Signed)
Encounter complete. 

## 2014-02-28 ENCOUNTER — Telehealth (HOSPITAL_COMMUNITY): Payer: Self-pay

## 2014-02-28 NOTE — Telephone Encounter (Signed)
Encounter complete. 

## 2014-03-04 ENCOUNTER — Ambulatory Visit (HOSPITAL_COMMUNITY)
Admission: RE | Admit: 2014-03-04 | Discharge: 2014-03-04 | Disposition: A | Discharge status: Home / Self Care | Payer: Medicare Other | Source: Ambulatory Visit | Attending: Internal Medicine | Admitting: Internal Medicine

## 2014-03-04 VITALS — Ht 70.0 in | Wt 186.0 lb

## 2014-03-04 DIAGNOSIS — R0989 Other specified symptoms and signs involving the circulatory and respiratory systems: Secondary | ICD-10-CM | POA: Diagnosis present

## 2014-03-04 DIAGNOSIS — F17200 Nicotine dependence, unspecified, uncomplicated: Secondary | ICD-10-CM | POA: Diagnosis not present

## 2014-03-04 DIAGNOSIS — R42 Dizziness and giddiness: Secondary | ICD-10-CM | POA: Insufficient documentation

## 2014-03-04 DIAGNOSIS — E785 Hyperlipidemia, unspecified: Secondary | ICD-10-CM | POA: Insufficient documentation

## 2014-03-04 DIAGNOSIS — I252 Old myocardial infarction: Secondary | ICD-10-CM

## 2014-03-04 DIAGNOSIS — R0609 Other forms of dyspnea: Secondary | ICD-10-CM | POA: Diagnosis not present

## 2014-03-04 DIAGNOSIS — I2510 Atherosclerotic heart disease of native coronary artery without angina pectoris: Secondary | ICD-10-CM

## 2014-03-04 DIAGNOSIS — E669 Obesity, unspecified: Secondary | ICD-10-CM | POA: Diagnosis not present

## 2014-03-04 MED ORDER — TECHNETIUM TC 99M SESTAMIBI GENERIC - CARDIOLITE
30.0000 | Freq: Once | INTRAVENOUS | Status: AC | PRN
  Administered 2014-03-04: 30 via INTRAVENOUS

## 2014-03-04 MED ORDER — TECHNETIUM TC 99M SESTAMIBI GENERIC - CARDIOLITE
10.0000 | Freq: Once | INTRAVENOUS | Status: AC | PRN
  Administered 2014-03-04: 10 via INTRAVENOUS

## 2014-03-04 NOTE — Procedures (Addendum)
[facility] [address] [place] [address] [phone]  Cardiology Nuclear Med Study  Jesus Jordan is a 68 y.o. male     MRN : [id number]     DOB: 1946/03/03  Procedure Date: 03/04/2014  Nuclear Med Background Indication for Stress Test:  Stent Patency History:  MI-09/12/2008;STENT/PTCA-09/12/2008;VF;Last NUC MPIO on 02/23/2012-scar;EF=71% Cardiac Risk Factors: Family History - CAD, Lipids, Overweight and Smoker  Symptoms:  Dizziness, DOE and Light-Headedness   Nuclear Pre-Procedure Caffeine/Decaff Intake:  9:00pm NPO After: 7:00am   IV Site: R Forearm  IV 0.9% NS with Angio Cath:  22g  Chest Size (in):  46"  IV Started by: Rolene Course, RN  Height: 5\' 10"  (1.778 m)  Cup Size: n/a  BMI:  Body mass index is 26.69 kg/(m^2). Weight:  186 lb (84.369 kg)   Tech Comments:  n/a    Nuclear Med Study 1 or 2 day study: 1 day  Stress Test Type:  Stress  Order Authorizing Provider:  Shelva Majestic, MD   Resting Radionuclide: Technetium 60m Sestamibi  Resting Radionuclide Dose: 10.3 mCi   Stress Radionuclide:  Technetium 83m Sestamibi  Stress Radionuclide Dose: 29.4 mCi           Stress Protocol Rest HR: 58 Stress HR: 137  Rest BP: 131/98 Stress BP: 173/89  Exercise Time (min): 11:00 METS: 11.20   Predicted Max HR: 153 bpm % Max HR: 89.54 bpm Rate Pressure Product: 23701  Dose of Adenosine (mg):  n/a Dose of Lexiscan: n/a mg  Dose of Atropine (mg): n/a Dose of Dobutamine: n/a mcg/kg/min (at max HR)  Stress Test Technologist: Mellody Memos, CCT Nuclear Technologist: Otho Perl, CNMT   Rest Procedure:  Myocardial perfusion imaging was performed at rest 45 minutes following the intravenous administration of Technetium 14m Sestamibi. Stress Procedure:  The patient performed treadmill exercise using a [name]  Protocol for 11 minutes. The patient stopped due to shrotness of breath and fatigue. Patient did complain of a  burning sensation in the center of his chest.  There were no significant ST-T wave changes.  Technetium 71m Sestamibi was injected IV at peak exercise and myocardial perfusion imaging was performed after a brief delay.  Transient Ischemic Dilatation (Normal <1.22):  0.80 QGS EDV:  70 ml QGS ESV:  25 ml LV Ejection Fraction: 64%  Rest ECG: NSR - Normal EKG  Stress ECG: No significant ST segment change suggestive of ischemia.  QPS Raw Data Images:  Normal; no motion artifact; normal heart/lung ratio. Stress Images:  There is decreased uptake in the inferior wall. Rest Images:  There is decreased uptake in the inferior wall. Subtraction (SDS):  There is a fixed inferior defect that is most consistent with diaphragmatic attenuation.  Impression Exercise Capacity:  Excellent exercise capacity. BP Response:  Normal blood pressure response. Clinical Symptoms:  No significant symptoms noted. ECG Impression:  No significant ST segment change suggestive of ischemia. Comparison with Prior Nuclear Study: No significant change from previous study  Overall Impression:  Low risk stress nuclear study with fixed inferior defect - no reversible ischemia. Excellent exercise tolerance.  LV Wall Motion:  NL LV Function; NL Wall Motion; EF 64%  Pixie Casino, MD, [facility] Board Certified in Nuclear Cardiology Attending Cardiologist [place], MD  03/04/2014 12:15 PM

## 2014-03-11 ENCOUNTER — Encounter (HOSPITAL_COMMUNITY): Payer: Medicare Other

## 2014-03-18 ENCOUNTER — Encounter: Payer: Self-pay | Admitting: Cardiovascular Disease

## 2014-03-18 ENCOUNTER — Ambulatory Visit (INDEPENDENT_AMBULATORY_CARE_PROVIDER_SITE_OTHER): Payer: Medicare Other | Admitting: Cardiovascular Disease

## 2014-03-18 VITALS — BP 130/82 | HR 60 | Ht 69.0 in | Wt 173.8 lb

## 2014-03-18 DIAGNOSIS — I2510 Atherosclerotic heart disease of native coronary artery without angina pectoris: Secondary | ICD-10-CM

## 2014-03-18 DIAGNOSIS — I252 Old myocardial infarction: Secondary | ICD-10-CM

## 2014-03-18 DIAGNOSIS — E785 Hyperlipidemia, unspecified: Secondary | ICD-10-CM | POA: Diagnosis not present

## 2014-03-18 DIAGNOSIS — M109 Gout, unspecified: Secondary | ICD-10-CM

## 2014-03-18 DIAGNOSIS — R5381 Other malaise: Secondary | ICD-10-CM

## 2014-03-18 DIAGNOSIS — R5383 Other fatigue: Secondary | ICD-10-CM

## 2014-03-18 LAB — LIPID PANEL
CHOL/HDL RATIO: 1.7 ratio
Cholesterol: 138 mg/dL (ref 0–200)
HDL: 83 mg/dL (ref >39)
LDL Cholesterol: 41 mg/dL (ref 0–99)
Triglycerides: 71 mg/dL (ref <150)
VLDL: 14 mg/dL (ref 0–40)

## 2014-03-18 LAB — TSH: TSH: 1.159 u[IU]/mL (ref 0.350–4.500)

## 2014-03-18 LAB — COMPREHENSIVE METABOLIC PANEL
ALT: 26 U/L (ref 0–53)
AST: 30 U/L (ref 0–37)
Albumin: 4.1 g/dL (ref 3.5–5.2)
Alkaline Phosphatase: 74 U/L (ref 39–117)
BUN: 13 mg/dL (ref 6–23)
CALCIUM: 9.7 mg/dL (ref 8.4–10.5)
CHLORIDE: 105 meq/L (ref 96–112)
CO2: 27 mEq/L (ref 19–32)
CREATININE: 1.07 mg/dL (ref 0.50–1.35)
Glucose, Bld: 101 mg/dL — ABNORMAL HIGH (ref 70–99)
Potassium: 5 mEq/L (ref 3.5–5.3)
Sodium: 140 mEq/L (ref 135–145)
Total Bilirubin: 0.8 mg/dL (ref 0.2–1.2)
Total Protein: 6.9 g/dL (ref 6.0–8.3)

## 2014-03-18 LAB — CBC
HEMATOCRIT: 44.3 % (ref 39.0–52.0)
Hemoglobin: 14.9 g/dL (ref 13.0–17.0)
MCH: 33 pg (ref 26.0–34.0)
MCHC: 33.6 g/dL (ref 30.0–36.0)
MCV: 98 fL (ref 78.0–100.0)
PLATELETS: 155 10*3/uL (ref 150–400)
RBC: 4.52 MIL/uL (ref 4.22–5.81)
RDW: 14.4 % (ref 11.5–15.5)
WBC: 6.2 10*3/uL (ref 4.0–10.5)

## 2014-03-18 NOTE — Progress Notes (Signed)
Patient ID: Jesus Jordan, male   DOB: [birth year], 68 y.o.   MRN: [id number]     HPI: Jesus Jordan is a 68 y.o. male  who presents to the office today for one-year cardiology evaluation.  Jesus Jordan suffered a large inferolateral myocardial infarction on 09/12/2008 and presented to [facility] with recurrent episodes of ventricular fibrillation and required numerous defibrillator shocks along with CPR.  I was able to successfully perform intervention to a totally occluded proximal left circumflex coronary artery. This was stented and more distally he had diffuse disease with joint and underwent PTCA at multiple sites in the very distal aspect of this dominant circumflex system. His RCA  was diffusely diseased with stenoses of 90% and was nondominant. His LAD was normal. He had undergone an initial stress test in one year showed an area of scar and inferolateral wall at the mid to basal region. He subsequently was taken off his Effient and then switched to Plavix. A nuclear perfusion study in September 2012 was unchanged from previously and showed the small moderate region of the mid to basal inferior inferolateral scar without associated ischemia.  Over the past year, Jesus Jordan has continued to do well. He plays tennis several days per week. He denies any recent episodes of chest pain. He has not had laboratory done in approximately a year. He does also have a history of gout. There is a history of an inguinal hernia. He sees Jesus Jordan for primary care.  On 03/04/2014 [name] underwent a three-year followup nuclear perfusion study.  This continued to be low risk and only showed a small fixed inferior defect without reversible ischemia.  He had normal wall motion.  Ejection fraction was 64%.  Past Medical History  Diagnosis Date  . GOUT 01/29/2009    Qualifier: Diagnosis of  By: Jesus Jordan    . CAD 01/29/2009    Qualifier: Diagnosis of  By: Jesus Jordan    . INGUINAL HERNIA 05/25/2009      Qualifier: Diagnosis of  By: Jesus Jordan    . Heart disease     cardiologist Jesus Jordan  . Hyperlipidemia   . Gout   . History of MI (myocardial infarction) 09/13/2006    large inferolateral MI secondary to total occlusion of circumflex  . History of nuclear stress test 02/23/2012    exercise myoview; area of scar in inferolateral wall at mid-basal region    Past Surgical History  Procedure Laterality Date  . Coronary stent placement  09/12/2008    r/t MI; 3.5x7m Xience DES to circumflex & PTCA at multiple sites in distal region of vessel; alos diffusely diseaase, narrowed, nondominant RCA  . Hernia repair  2011    left inguinal hernia  . Foot surgery  1960  . Transthoracic echocardiogram  03/02/2009    EXB=14-78% LV systolic function borderline reduced with mild inferior wall hypokinesis; mild-mod MR; mild TR; AV mildly sclerotic; mild aortic root dilatation    No Known Allergies  Current Outpatient Prescriptions  Medication Sig Dispense Refill  . allopurinol (ZYLOPRIM) 300 MG tablet TAKE ONE TABLET BY MOUTH ONCE DAILY  30 tablet  11  . aspirin 81 MG tablet Take 81 mg by mouth daily.      . carvedilol (COREG) 12.5 MG tablet TAKE ONE TABLET BY MOUTH TWICE DAILY  60 tablet  11  . clopidogrel (PLAVIX) 75 MG tablet TAKE ONE TABLET BY MOUTH ONCE DAILY  30 tablet  3  . colchicine (  COLCRYS) 0.6 MG tablet Take 1 tablet (0.6 mg total) by mouth 2 (two) times daily.  180 tablet  1  . fluticasone (CUTIVATE) 0.05 % cream       . isosorbide mononitrate (IMDUR) 30 MG 24 hr tablet TAKE ONE TABLET BY MOUTH EVERY DAY  30 tablet  2  . lisinopril (PRINIVIL,ZESTRIL) 10 MG tablet TAKE ONE TABLET BY MOUTH ONCE DAILY  30 tablet  11  . NITROSTAT 0.4 MG SL tablet DISSOLVE ONE TABLET UNDER THE TONGUE EVERY 5 MINUTES AS NEEDED FOR CHEST PAIN.  DO NOT EXCEED A TOTAL OF 3 DOSES IN 15 MINUTES  25 tablet  3  . nystatin cream (MYCOSTATIN) Apply 1 application topically 2 (two) times daily.  30 g  2  .  pantoprazole (PROTONIX) 40 MG tablet TAKE ONE TABLET BY MOUTH EVERY DAY AS NEEDED  30 tablet  4  . simvastatin (ZOCOR) 40 MG tablet TAKE ONE TABLET BY MOUTH AT BEDTIME  30 tablet  11  . ZETIA 10 MG tablet TAKE ONE TABLET BY MOUTH EVERY DAY  30 tablet  11   Current Facility-Administered Medications  Medication Dose Route Frequency Provider Last Rate Last Dose  . zoster vaccine live (PF) (ZOSTAVAX) injection 19,400 Units  0.65 mL Subcutaneous Once Eulas Post, MD        History   Social History  . Marital Status: Single    Spouse Name: N/A    Number of Children: 3  . Years of Education: N/A   Occupational History  . Not on file.   Social History Main Topics  . Smoking status: Current Some Day Smoker -- 30 years    Types: Cigarettes  . Smokeless tobacco: Never Used  . Alcohol Use: Yes  . Drug Use: No  . Sexual Activity: Not on file   Other Topics Concern  . Not on file   Social History Narrative  . No narrative on file    Family History  Problem Relation Age of Onset  . Hyperlipidemia Brother   . Heart disease Brother 8    massive MI  . Cancer Paternal Grandfather     colon  . Colon cancer Jesus Jordan    Socially he is divorced. Has 3 children and 2 grandchildren. Is no tobacco use. He does drink occasional alcohol. He does play tennis.  ROS General: Negative; No fevers, chills, or night sweats;  HEENT: Negative; No changes in vision or hearing, sinus congestion, difficulty swallowing Pulmonary: Negative; No cough, wheezing, shortness of breath, hemoptysis Cardiovascular: Negative; No chest pain, presyncope, syncope, palpitations GI: Negative; No nausea, vomiting, diarrhea, or abdominal pain GU: Negative; No dysuria, hematuria, or difficulty voiding Musculoskeletal: Negative; no myalgias, joint pain, or weakness He has a history of gout, but denies any recurrence. Hematologic/Oncology: Negative; no easy bruising, bleeding Endocrine: Negative; no heat/cold  intolerance; no diabetes Neuro: Negative; no changes in balance, headaches Skin: Negative; No rashes or skin lesions Psychiatric: Negative; No behavioral problems, depression Sleep: Negative; No snoring, daytime sleepiness, hypersomnolence, bruxism, restless legs, hypnogognic hallucinations, no cataplexy Other comprehensive 14 point system review is negative.  PE BP 130/82  Pulse 60  Ht '5\' 9"'  (1.753 m)  Wt 173 lb 12.8 oz (78.835 kg)  BMI 25.65 kg/m2  General: Alert, oriented, no distress.  Skin: normal turgor, no rashes HEENT: Normocephalic, atraumatic. Pupils round and reactive; sclera anicteric;no lid lag.  Nose without nasal septal hypertrophy Mouth/Parynx benign; Mallinpatti scale 2/3 Neck: No JVD, no carotid bruits with normal  carotid upstroke Lungs: clear to ausculatation and percussion; no wheezing or rales Chest wall: Nontender to palpation Heart: RRR, s1 s2 normal 1/6 systolic murmur Abdomen: soft, nontender; no hepatosplenomehaly, BS+; abdominal aorta nontender and not dilated by palpation. Pulses 2+ Back: No CVA tenderness Extremities: no clubbing cyanosis or edema, Homan's sign negative  Neurologic: grossly nonfocal Psychological: Normal affect and mood  ECG (independently read by me): Sinus bradycardia 57 beats per minute.  Borderline first-degree AV block with PR interval of 204 ms.  Prior ECG: Sinus rhythm at 63 beats per minute. Normal intervals.  LABS:  BMET    Component Value Date/Time   NA 140 03/06/2013 1039   K 5.1 03/06/2013 1039   CL 105 03/06/2013 1039   CO2 28 03/06/2013 1039   GLUCOSE 99 03/06/2013 1039   BUN 15 03/06/2013 1039   CREATININE 1.11 03/06/2013 1039   CREATININE 1.1 02/14/2012 1007   CALCIUM 9.8 03/06/2013 1039   GFRNONAA >60 12/23/2009 1145   GFRAA  Value: >60        The eGFR has been calculated using the MDRD equation. This calculation has not been validated in all clinical situations. eGFR's persistently <60 mL/min signify possible Chronic  Kidney Disease. 12/23/2009 1145     Hepatic Function Panel     Component Value Date/Time   PROT 7.4 03/06/2013 1039   ALBUMIN 4.4 03/06/2013 1039   AST 24 03/06/2013 1039   ALT 23 03/06/2013 1039   ALKPHOS 72 03/06/2013 1039   BILITOT 0.6 03/06/2013 1039   BILIDIR 0.2 02/14/2012 1007     CBC    Component Value Date/Time   WBC 7.0 03/06/2013 1039   RBC 4.70 03/06/2013 1039   HGB 15.4 03/06/2013 1039   HCT 44.9 03/06/2013 1039   PLT 193 03/06/2013 1039   MCV 95.5 03/06/2013 1039   MCH 32.8 03/06/2013 1039   MCHC 34.3 03/06/2013 1039   RDW 14.6 03/06/2013 1039   LYMPHSABS 2.1 12/23/2009 1145   MONOABS 0.8 12/23/2009 1145   EOSABS 0.4 12/23/2009 1145   BASOSABS 0.0 12/23/2009 1145     BNP    Component Value Date/Time   PROBNP 313.0* 09/19/2008 0434    Lipid Panel     Component Value Date/Time   CHOL 175 02/14/2012 1007   TRIG 74 03/06/2013 1039   TRIG 122.0 02/14/2012 1007   HDL 66.90 02/14/2012 1007   CHOLHDL 3 02/14/2012 1007   VLDL 24.4 02/14/2012 1007   LDLCALC 54 03/06/2013 1039   LDLCALC 84 02/14/2012 1007     RADIOLOGY: No results found.    ASSESSMENT AND PLAN: Mr. Troy is now 5 years status post his VF cardiac arrest secondary to total proximal occlusion of his of a dominant left circumflex artery artery. He has been demonstrated to have significant myocardial salvage in the lateral wall but does have mid to basal inferior to inferolateral scar.  His most recent 3.  Her followup nuclear perfusion study did not reveal any wall motion abnormality and his ejection fraction was 64% without evidence for ischemia.  As the ECG today shows sinus bradycardia with borderline first-degree block with a PR interval 204 ms.  His ECG does not reveal any evidence for prior myocardial infarction.  He has been tolerating his medical regimen well. He is taking Zetia 10 mg and Zocor 40 mg for hyperlipidemia. He remains on dual anti-platelet therapy with 81 mg aspirin plus Plavix 75 mg. He is on ACE  inhibitor with lisinopril 10 mg ,  carvedilol and nitrates , and blood pressure today is stable. He is fasting today. I will check a complete set of laboratory.  I will contact him regarding his results.  Adjustments will be made to his medical regimen if necessary.   Troy Sine, MD, [facility]  03/18/2014 7:32 PM

## 2014-03-18 NOTE — Patient Instructions (Signed)
Your physician recommends that you return for lab work.  Your physician wants you to follow-up in:1 year. You will receive a reminder letter in the mail two months in advance. If you don't receive a letter, please call our office to schedule the follow-up appointment.

## 2014-03-21 ENCOUNTER — Encounter: Payer: Self-pay | Admitting: *Deleted

## 2014-03-26 ENCOUNTER — Other Ambulatory Visit: Payer: Self-pay | Admitting: Cardiovascular Disease

## 2014-03-26 NOTE — Telephone Encounter (Signed)
Rx was sent to pharmacy electronically. 

## 2014-04-28 ENCOUNTER — Other Ambulatory Visit: Payer: Self-pay | Admitting: Cardiovascular Disease

## 2014-04-29 ENCOUNTER — Other Ambulatory Visit: Payer: Self-pay

## 2014-04-29 MED ORDER — CLOPIDOGREL BISULFATE 75 MG PO TABS: 75.0000 mg | ORAL_TABLET | Freq: Once | ORAL | Status: DC

## 2014-04-29 NOTE — Telephone Encounter (Signed)
Rx sent to pharmacy   

## 2014-05-12 ENCOUNTER — Ambulatory Visit (INDEPENDENT_AMBULATORY_CARE_PROVIDER_SITE_OTHER): Payer: Medicare Other | Admitting: *Deleted

## 2014-05-12 ENCOUNTER — Ambulatory Visit: Payer: Medicare Other

## 2014-05-12 DIAGNOSIS — Z23 Encounter for immunization: Secondary | ICD-10-CM | POA: Diagnosis not present

## 2014-06-04 ENCOUNTER — Other Ambulatory Visit: Payer: Self-pay | Admitting: Cardiovascular Disease

## 2014-07-03 ENCOUNTER — Other Ambulatory Visit: Payer: Self-pay | Admitting: Family Medicine

## 2014-11-10 ENCOUNTER — Other Ambulatory Visit: Payer: Self-pay | Admitting: Cardiovascular Disease

## 2014-11-10 NOTE — Telephone Encounter (Signed)
Rx(s) sent to pharmacy electronically.  

## 2014-12-30 ENCOUNTER — Other Ambulatory Visit: Payer: Self-pay | Admitting: *Deleted

## 2014-12-30 MED ORDER — NITROGLYCERIN 0.4 MG SL SUBL: 0.4000 mg | SUBLINGUAL_TABLET | SUBLINGUAL | Status: DC | PRN

## 2015-01-08 DIAGNOSIS — L570 Actinic keratosis: Secondary | ICD-10-CM | POA: Diagnosis not present

## 2015-01-08 DIAGNOSIS — X32XXXD Exposure to sunlight, subsequent encounter: Secondary | ICD-10-CM | POA: Diagnosis not present

## 2015-01-08 DIAGNOSIS — Z1283 Encounter for screening for malignant neoplasm of skin: Secondary | ICD-10-CM | POA: Diagnosis not present

## 2015-01-14 ENCOUNTER — Ambulatory Visit (INDEPENDENT_AMBULATORY_CARE_PROVIDER_SITE_OTHER): Payer: Medicare Other | Admitting: Family Medicine

## 2015-01-14 ENCOUNTER — Encounter: Payer: Self-pay | Admitting: Family Medicine

## 2015-01-14 VITALS — BP 110/80 | HR 62 | Temp 97.8°F | Ht 69.0 in | Wt 164.0 lb

## 2015-01-14 DIAGNOSIS — Z23 Encounter for immunization: Secondary | ICD-10-CM

## 2015-01-14 DIAGNOSIS — E785 Hyperlipidemia, unspecified: Secondary | ICD-10-CM

## 2015-01-14 DIAGNOSIS — M109 Gout, unspecified: Secondary | ICD-10-CM

## 2015-01-14 DIAGNOSIS — Z125 Encounter for screening for malignant neoplasm of prostate: Secondary | ICD-10-CM

## 2015-01-14 DIAGNOSIS — Z0000 Encounter for general adult medical examination without abnormal findings: Secondary | ICD-10-CM | POA: Diagnosis not present

## 2015-01-14 LAB — HEPATIC FUNCTION PANEL
ALK PHOS: 87 U/L (ref 39–117)
ALT: 22 U/L (ref 0–53)
AST: 26 U/L (ref 0–37)
Albumin: 4.3 g/dL (ref 3.5–5.2)
BILIRUBIN TOTAL: 0.9 mg/dL (ref 0.2–1.2)
Bilirubin, Direct: 0.2 mg/dL (ref 0.0–0.3)
TOTAL PROTEIN: 7.2 g/dL (ref 6.0–8.3)

## 2015-01-14 LAB — LIPID PANEL
CHOL/HDL RATIO: 2
Cholesterol: 151 mg/dL (ref 0–200)
HDL: 74 mg/dL (ref >39.00)
LDL CALC: 63 mg/dL (ref 0–99)
NonHDL: 77
TRIGLYCERIDES: 72 mg/dL (ref 0.0–149.0)
VLDL: 14.4 mg/dL (ref 0.0–40.0)

## 2015-01-14 LAB — BASIC METABOLIC PANEL
BUN: 13 mg/dL (ref 6–23)
CHLORIDE: 103 meq/L (ref 96–112)
CO2: 30 mEq/L (ref 19–32)
Calcium: 9.7 mg/dL (ref 8.4–10.5)
Creatinine, Ser: 0.94 mg/dL (ref 0.40–1.50)
GFR: 84.84 mL/min (ref >60.00)
GLUCOSE: 88 mg/dL (ref 70–99)
Potassium: 4.7 mEq/L (ref 3.5–5.1)
Sodium: 140 mEq/L (ref 135–145)

## 2015-01-14 LAB — PSA: PSA: 0.43 ng/mL (ref 0.10–4.00)

## 2015-01-14 NOTE — Progress Notes (Signed)
Subjective:    Patient ID: Jesus Jordan, male    DOB: [birth year], 69 y.o.   MRN: [id number]  HPI Patient here for wellness exam and medical follow-up. He has history of CAD, gout, dyslipidemia. Followed closely by cardiology. He still plays tennis. No recent chest pains. Unfortunately, he still smoking about 6-7 cigarettes per day. No history of Prevnar 13. Otherwise, immunizations up-to-date. Colonoscopy is up-to-date. Father had colon cancer he needs every 5 year screening.  Past Medical History  Diagnosis Date  . GOUT 01/29/2009    Qualifier: Diagnosis of  By: [name] MD, Seann Genther    . CAD 01/29/2009    Qualifier: Diagnosis of  By: [name] MD, Sherin Murdoch    . INGUINAL HERNIA 05/25/2009    Qualifier: Diagnosis of  By: [name] MD, Jabin Tapp    . Heart disease     cardiologist Dr Claiborne Billings  . Hyperlipidemia   . Gout   . History of MI (myocardial infarction) 09/13/2006    large inferolateral MI secondary to total occlusion of circumflex  . History of nuclear stress test 02/23/2012    exercise myoview; area of scar in inferolateral wall at mid-basal region   Past Surgical History  Procedure Laterality Date  . Coronary stent placement  09/12/2008    r/t MI; 3.5x77mm Xience DES to circumflex & PTCA at multiple sites in distal region of vessel; alos diffusely diseaase, narrowed, nondominant RCA  . Hernia repair  2011    left inguinal hernia  . Foot surgery  1960  . Transthoracic echocardiogram  03/02/2009    GX=21-19%; LV systolic function borderline reduced with mild inferior wall hypokinesis; mild-mod MR; mild TR; AV mildly sclerotic; mild aortic root dilatation    reports that he has been smoking Cigarettes.  He has smoked for the past 30 years. He has never used smokeless tobacco. He reports that he drinks alcohol. He reports that he does not use illicit drugs. family history includes Cancer in his paternal grandfather; Colon cancer in his father; Heart disease (age of onset: 33) in his brother;  Hyperlipidemia in his brother. No Known Allergies  1.  Risk factors based on Past Medical , Social, and Family history reviewed and as indicated above with no changes 2.  Limitations in physical activities None.  No recent falls. 3.  Depression/mood No active depression or anxiety issues 4.  Hearing No defiits 5.  ADLs independent in all. 6.  Cognitive function (orientation to time and place, language, writing, speech,memory) no short or long term memory issues.  Language and judgement intact. 7.  Home Safety no issues 8.  Height, weight, and visual acuity.all stable. 9.  Counseling discussed recommend yearly flu vaccine. Smoking cessation discussed. Handouts given. 10. Recommendation of preventive services. Prevnar 13. 11. Labs based on risk factors lipid, hepatic, basic metabolic panel, PSA 12. Care Plan as above 13. Other Providers Dr Claiborne Billings cardiology 14. Written schedule of screening/prevention services given to patient.    Review of Systems  Constitutional: Negative for fever, activity change, appetite change and fatigue.  HENT: Negative for congestion, ear pain and trouble swallowing.   Eyes: Negative for pain and visual disturbance.  Respiratory: Negative for cough, shortness of breath and wheezing.   Cardiovascular: Negative for chest pain and palpitations.  Gastrointestinal: Negative for nausea, vomiting, abdominal pain, diarrhea, constipation, blood in stool, abdominal distention and rectal pain.  Genitourinary: Negative for dysuria, hematuria and testicular pain.  Musculoskeletal: Negative for joint swelling and arthralgias.  Skin: Negative for rash.  Neurological: Negative for dizziness, syncope and headaches.  Hematological: Negative for adenopathy.  Psychiatric/Behavioral: Negative for confusion and dysphoric mood.       Objective:   Physical Exam  Constitutional: He is oriented to person, place, and time. He appears well-developed and well-nourished. No distress.    HENT:  Head: Normocephalic and atraumatic.  Right Ear: External ear normal.  Left Ear: External ear normal.  Mouth/Throat: Oropharynx is clear and moist.  Eyes: Conjunctivae and EOM are normal. Pupils are equal, round, and reactive to light.  Neck: Normal range of motion. Neck supple. No thyromegaly present.  Cardiovascular: Normal rate, regular rhythm and normal heart sounds.   No murmur heard. Pulmonary/Chest: No respiratory distress. He has no wheezes. He has no rales.  Abdominal: Soft. Bowel sounds are normal. He exhibits no distension and no mass. There is no tenderness. There is no rebound and no guarding.  Genitourinary:  Prostate minimally enlarged but smooth and nontender. No asymmetry. He has some small external skin tags but no evidence for acute inflammation or thrombosis.  Musculoskeletal: He exhibits no edema.  Lymphadenopathy:    He has no cervical adenopathy.  Neurological: He is alert and oriented to person, place, and time. He displays normal reflexes. No cranial nerve deficit.  Skin: No rash noted.  Psychiatric: He has a normal mood and affect.          Assessment & Plan:  #1 Medicare wellness exam. Prevnar 13 given. Recommend yearly flu vaccine. The natural history of prostate cancer and ongoing controversy regarding screening and potential treatment outcomes of prostate cancer has been discussed with the patient. The meaning of a false positive PSA and a false negative PSA has been discussed. He indicates understanding of the limitations of this screening test and wishes  to proceed with screening PSA testing. Smoking cessation discussed. Information on [place] quit line given along with handout. #2 history of CAD and dyslipidemia. Check fasting lipid and hepatic panel. Will also check basic metabolic panel.

## 2015-01-14 NOTE — Patient Instructions (Signed)
Smoking Cessation Quitting smoking is important to your health and has many advantages. However, it is not always easy to quit since nicotine is a very addictive drug. Oftentimes, people try 3 times or more before being able to quit. This document explains the best ways for you to prepare to quit smoking. Quitting takes hard work and a lot of effort, but you can do it. ADVANTAGES OF QUITTING SMOKING  You will live longer, feel better, and live better.  Your body will feel the impact of quitting smoking almost immediately.  Within 20 minutes, blood pressure decreases. Your pulse returns to its normal level.  After 8 hours, carbon monoxide levels in the blood return to normal. Your oxygen level increases.  After 24 hours, the chance of having a heart attack starts to decrease. Your breath, hair, and body stop smelling like smoke.  After 48 hours, damaged nerve endings begin to recover. Your sense of taste and smell improve.  After 72 hours, the body is virtually free of nicotine. Your bronchial tubes relax and breathing becomes easier.  After 2 to 12 weeks, lungs can hold more air. Exercise becomes easier and circulation improves.  The risk of having a heart attack, stroke, cancer, or lung disease is greatly reduced.  After 1 year, the risk of coronary heart disease is cut in half.  After 5 years, the risk of stroke falls to the same as a nonsmoker.  After 10 years, the risk of lung cancer is cut in half and the risk of other cancers decreases significantly.  After 15 years, the risk of coronary heart disease drops, usually to the level of a nonsmoker.  If you are pregnant, quitting smoking will improve your chances of having a healthy baby.  The people you live with, especially any children, will be healthier.  You will have extra money to spend on things other than cigarettes. QUESTIONS TO THINK ABOUT BEFORE ATTEMPTING TO QUIT You may want to talk about your answers with your  health care provider.  Why do you want to quit?  If you tried to quit in the past, what helped and what did not?  What will be the most difficult situations for you after you quit? How will you plan to handle them?  Who can help you through the tough times? Your family? Friends? A health care provider?  What pleasures do you get from smoking? What ways can you still get pleasure if you quit? Here are some questions to ask your health care provider:  How can you help me to be successful at quitting?  What medicine do you think would be best for me and how should I take it?  What should I do if I need more help?  What is smoking withdrawal like? How can I get information on withdrawal? GET READY  Set a quit date.  Change your environment by getting rid of all cigarettes, ashtrays, matches, and lighters in your home, car, or work. Do not let people smoke in your home.  Review your past attempts to quit. Think about what worked and what did not. GET SUPPORT AND ENCOURAGEMENT You have a better chance of being successful if you have help. You can get support in many ways.  Tell your family, friends, and coworkers that you are going to quit and need their support. Ask them not to smoke around you.  Get individual, group, or telephone counseling and support. Programs are available at local hospitals and health centers. Call   your local health department for information about programs in your area.  Spiritual beliefs and practices may help some smokers quit.  Download a "quit meter" on your computer to keep track of quit statistics, such as how long you have gone without smoking, cigarettes not smoked, and money saved.  Get a self-help book about quitting smoking and staying off tobacco. LEARN NEW SKILLS AND BEHAVIORS  Distract yourself from urges to smoke. Talk to someone, go for a walk, or occupy your time with a task.  Change your normal routine. Take a different route to work.  Drink tea instead of coffee. Eat breakfast in a different place.  Reduce your stress. Take a hot bath, exercise, or read a book.  Plan something enjoyable to do every day. Reward yourself for not smoking.  Explore interactive web-based programs that specialize in helping you quit. GET MEDICINE AND USE IT CORRECTLY Medicines can help you stop smoking and decrease the urge to smoke. Combining medicine with the above behavioral methods and support can greatly increase your chances of successfully quitting smoking.  Nicotine replacement therapy helps deliver nicotine to your body without the negative effects and risks of smoking. Nicotine replacement therapy includes nicotine gum, lozenges, inhalers, nasal sprays, and skin patches. Some may be available over-the-counter and others require a prescription.  Antidepressant medicine helps people abstain from smoking, but how this works is unknown. This medicine is available by prescription.  Nicotinic receptor partial agonist medicine simulates the effect of nicotine in your brain. This medicine is available by prescription. Ask your health care provider for advice about which medicines to use and how to use them based on your health history. Your health care provider will tell you what side effects to look out for if you choose to be on a medicine or therapy. Carefully read the information on the package. Do not use any other product containing nicotine while using a nicotine replacement product.  RELAPSE OR DIFFICULT SITUATIONS Most relapses occur within the first 3 months after quitting. Do not be discouraged if you start smoking again. Remember, most people try several times before finally quitting. You may have symptoms of withdrawal because your body is used to nicotine. You may crave cigarettes, be irritable, feel very hungry, cough often, get headaches, or have difficulty concentrating. The withdrawal symptoms are only temporary. They are strongest  when you first quit, but they will go away within 10-14 days. To reduce the chances of relapse, try to:  Avoid drinking alcohol. Drinking lowers your chances of successfully quitting.  Reduce the amount of caffeine you consume. Once you quit smoking, the amount of caffeine in your body increases and can give you symptoms, such as a rapid heartbeat, sweating, and anxiety.  Avoid smokers because they can make you want to smoke.  Do not let weight gain distract you. Many smokers will gain weight when they quit, usually less than 10 pounds. Eat a healthy diet and stay active. You can always lose the weight gained after you quit.  Find ways to improve your mood other than smoking. FOR MORE INFORMATION  www.smokefree.gov  Document Released: 05/31/2001 Document Revised: 10/21/2013 Document Reviewed: 09/15/2011 [employer] Patient Information 2015 [employer]. This information is not intended to replace advice given to you by your health care provider. Make sure you discuss any questions you have with your health care provider.  

## 2015-01-14 NOTE — Addendum Note (Signed)
Addended by: Marcina Millard on: 01/14/2015 10:47 AM   Modules accepted: Orders

## 2015-02-28 ENCOUNTER — Other Ambulatory Visit: Payer: Self-pay | Admitting: Family Medicine

## 2015-04-02 ENCOUNTER — Other Ambulatory Visit: Payer: Self-pay | Admitting: Cardiovascular Disease

## 2015-04-02 NOTE — Telephone Encounter (Signed)
Rx(s) sent to pharmacy electronically.  

## 2015-04-23 ENCOUNTER — Ambulatory Visit (INDEPENDENT_AMBULATORY_CARE_PROVIDER_SITE_OTHER): Payer: Medicare Other | Admitting: Cardiovascular Disease

## 2015-04-23 ENCOUNTER — Encounter: Payer: Self-pay | Admitting: Cardiovascular Disease

## 2015-04-23 VITALS — BP 114/76 | HR 61 | Ht 70.0 in | Wt 166.0 lb

## 2015-04-23 DIAGNOSIS — I2510 Atherosclerotic heart disease of native coronary artery without angina pectoris: Secondary | ICD-10-CM

## 2015-04-23 DIAGNOSIS — E785 Hyperlipidemia, unspecified: Secondary | ICD-10-CM | POA: Diagnosis not present

## 2015-04-23 DIAGNOSIS — I469 Cardiac arrest, cause unspecified: Secondary | ICD-10-CM | POA: Diagnosis not present

## 2015-04-23 DIAGNOSIS — I252 Old myocardial infarction: Secondary | ICD-10-CM | POA: Diagnosis not present

## 2015-04-23 DIAGNOSIS — I219 Acute myocardial infarction, unspecified: Secondary | ICD-10-CM

## 2015-04-23 DIAGNOSIS — I213 ST elevation (STEMI) myocardial infarction of unspecified site: Secondary | ICD-10-CM

## 2015-04-23 NOTE — Patient Instructions (Signed)
Your physician recommends that you schedule a follow-up appointment in: 1 year with Dr. Claiborne Billings.

## 2015-04-23 NOTE — Progress Notes (Signed)
Patient ID: Jesus Jordan, male   DOB: [birth year], 69 y.o.   MRN: [id number]     HPI: Jesus Jordan is a 69 y.o. male  who presents to the office today for one-year cardiology evaluation.  Jesus Jordan suffered a large inferolateral myocardial infarction on 09/12/2008 and presented to [facility] with recurrent episodes of ventricular fibrillation and required numerous defibrillator shocks along with CPR.  I was able to successfully perform intervention to a totally occluded proximal left circumflex coronary artery. This was stented and more distally he had diffuse disease with joint and underwent PTCA at multiple sites in the very distal aspect of this dominant circumflex system. His RCA  was diffusely diseased with stenoses of 90% and was nondominant. His LAD was normal. He had undergone an initial stress test in one year showed an area of scar and inferolateral wall at the mid to basal region. He subsequently was taken off his Effient and then switched to Plavix. A nuclear perfusion study in September 2012 was unchanged from previously and showed the small moderate region of the mid to basal inferior inferolateral scar without associated ischemia.  On 03/04/2014 a three-year followup nuclear perfusion study continued to be low risk and only showed a small fixed inferior defect without reversible ischemia.  He had normal wall motion.  Ejection fraction was 64%.  Since I saw him last year, Jesus Jordan has continued to do well. He plays tennis several days per week. He denies any recent episodes of chest pain.  He sees Dr. [name] for primary care.  He has continued to be on aspirin and Plavix for antiplatelet therapy.  He is on carvedilol 12.5 twice a day, lisinopril 10 mg daily and continues to take low-dose isosorbide mononitrate 30 mg.  He has been without recurrent anginal symptomatology.  He is on combination therapy with Zetia 10 mg and simvastatin 40 mg for hyperlipidemia.  He presents for  evaluation    Past Medical History  Diagnosis Date  . GOUT 01/29/2009    Qualifier: Diagnosis of  By: [name] MD, [name]    . CAD 01/29/2009    Qualifier: Diagnosis of  By: [name] MD, [name]    . INGUINAL HERNIA 05/25/2009    Qualifier: Diagnosis of  By: [name] MD, [name]    . Heart disease     cardiologist Dr Claiborne Billings  . Hyperlipidemia   . Gout   . History of MI (myocardial infarction) 09/13/2006    large inferolateral MI secondary to total occlusion of circumflex  . History of nuclear stress test 02/23/2012    exercise myoview; area of scar in inferolateral wall at mid-basal region    Past Surgical History  Procedure Laterality Date  . Coronary stent placement  09/12/2008    r/t MI; 3.5x98m Xience DES to circumflex & PTCA at multiple sites in distal region of vessel; alos diffusely diseaase, narrowed, nondominant RCA  . Hernia repair  2011    left inguinal hernia  . Foot surgery  1960  . Transthoracic echocardiogram  03/02/2009    EHQ=75-91% LV systolic function borderline reduced with mild inferior wall hypokinesis; mild-mod MR; mild TR; AV mildly sclerotic; mild aortic root dilatation    No Known Allergies  Current Outpatient Prescriptions  Medication Sig Dispense Refill  . allopurinol (ZYLOPRIM) 300 MG tablet TAKE ONE TABLET BY MOUTH ONCE DAILY 30 tablet 5  . aspirin 81 MG tablet Take 81 mg by mouth daily.    . carvedilol (COREG) 12.5 MG tablet  TAKE ONE TABLET BY MOUTH TWICE DAILY 60 tablet 0  . clopidogrel (PLAVIX) 75 MG tablet TAKE ONE TABLET BY MOUTH ONCE DAILY 30 tablet 0  . colchicine (COLCRYS) 0.6 MG tablet Take 1 tablet (0.6 mg total) by mouth 2 (two) times daily. 180 tablet 1  . fluticasone (CUTIVATE) 0.05 % cream     . isosorbide mononitrate (IMDUR) 30 MG 24 hr tablet Take 1 tablet (30 mg total) by mouth daily. 30 tablet 6  . lisinopril (PRINIVIL,ZESTRIL) 10 MG tablet TAKE ONE TABLET BY MOUTH ONCE DAILY 30 tablet 0  . nitroGLYCERIN (NITROSTAT) 0.4 MG SL tablet  Place 1 tablet (0.4 mg total) under the tongue every 5 (five) minutes as needed for chest pain. NEED OV. 25 tablet 1  . pantoprazole (PROTONIX) 40 MG tablet TAKE ONE TABLET BY MOUTH ONCE DAILY AS NEEDED 30 tablet 0  . simvastatin (ZOCOR) 40 MG tablet TAKE ONE TABLET BY MOUTH AT BEDTIME 30 tablet 0  . ZETIA 10 MG tablet TAKE ONE TABLET BY MOUTH ONCE DAILY 30 tablet 0   Current Facility-Administered Medications  Medication Dose Route Frequency Provider Last Rate Last Dose  . zoster vaccine live (PF) (ZOSTAVAX) injection 19,400 Units  0.65 mL Subcutaneous Once Eulas Post, MD        Social History   Social History  . Marital Status: Single    Spouse Name: N/A  . Number of Children: 3  . Years of Education: N/A   Occupational History  . Not on file.   Social History Main Topics  . Smoking status: Current Some Day Smoker -- 30 years    Types: Cigarettes  . Smokeless tobacco: Never Used  . Alcohol Use: Yes  . Drug Use: No  . Sexual Activity: Not on file   Other Topics Concern  . Not on file   Social History Narrative   Social history is notable that he is divorced.  His daughter is now working in [employer].  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  There is no present tobacco use.  Family History  Problem Relation Age of Onset  . Hyperlipidemia Brother   . Heart disease Brother 70    massive MI  . Cancer Paternal Grandfather     colon  . Colon cancer Father    Socially he is divorced. Has 3 children and 2 grandchildren. Is no tobacco use. He does drink occasional alcohol. He does play tennis.  ROS General: Negative; No fevers, chills, or night sweats;  HEENT: Negative; No changes in vision or hearing, sinus congestion, difficulty swallowing Pulmonary: Negative; No cough, wheezing, shortness of breath, hemoptysis Cardiovascular: Negative; No chest pain, presyncope, syncope, palpitations GI: Negative; No nausea, vomiting,  diarrhea, or abdominal pain GU: Negative; No dysuria, hematuria, or difficulty voiding Musculoskeletal: Negative; no myalgias, joint pain, or weakness He has a history of gout, but denies any recurrence. Hematologic/Oncology: Negative; no easy bruising, bleeding Endocrine: Negative; no heat/cold intolerance; no diabetes Neuro: Negative; no changes in balance, headaches Skin: Negative; No rashes or skin lesions Psychiatric: Negative; No behavioral problems, depression Sleep: Negative; No snoring, daytime sleepiness, hypersomnolence, bruxism, restless legs, hypnogognic hallucinations, no cataplexy Other comprehensive 14 point system review is negative.  PE BP 114/76 mmHg  Pulse 61  Ht '5\' 10"'  (1.778 m)  Wt 166 lb (75.297 kg)  BMI 23.82 kg/m2   Wt Readings from Last 3 Encounters:  04/23/15 166 lb (75.297 kg)  01/14/15 164 lb (74.39 kg)  03/18/14 173 lb 12.8 oz (78.835 kg)   General: Alert, oriented, no distress.  Skin: normal turgor, no rashes HEENT: Normocephalic, atraumatic. Pupils round and reactive; sclera anicteric;no lid lag.  Nose without nasal septal hypertrophy Mouth/Parynx benign; Mallinpatti scale 2/3 Neck: No JVD, no carotid bruits with normal carotid upstroke Lungs: clear to ausculatation and percussion; no wheezing or rales Chest wall: Nontender to palpation Heart: RRR, s1 s2 normal 1/6 systolic murmur Abdomen: soft, nontender; no hepatosplenomehaly, BS+; abdominal aorta nontender and not dilated by palpation. Pulses 2+ Back: No CVA tenderness Extremities: no clubbing cyanosis or edema, Homan's sign negative  Neurologic: grossly nonfocal Psychological: Normal affect and mood  ECG (independently read by me): Normal sinus rhythm at 61 bpm.  No ectopy.  Normal intervals.  No ECG evidence of prior myocardial infarction.  September 2015 ECG (independently read by me): Sinus bradycardia 57 beats per minute.  Borderline first-degree AV block with PR interval of 204  ms.  Prior ECG: Sinus rhythm at 63 beats per minute. Normal intervals.  LABS:  BMP Latest Ref Rng 01/14/2015 03/18/2014 03/06/2013  Glucose 70 - 99 mg/dL 88 101(H) 99  BUN 6 - 23 mg/dL '13 13 15  ' Creatinine 0.40 - 1.50 mg/dL 0.94 1.07 1.11  Sodium 135 - 145 mEq/L 140 140 140  Potassium 3.5 - 5.1 mEq/L 4.7 5.0 5.1  Chloride 96 - 112 mEq/L 103 105 105  CO2 19 - 32 mEq/L '30 27 28  ' Calcium 8.4 - 10.5 mg/dL 9.7 9.7 9.8   Hepatic Function Latest Ref Rng 01/14/2015 03/18/2014 03/06/2013  Total Protein 6.0 - 8.3 g/dL 7.2 6.9 7.4  Albumin 3.5 - 5.2 g/dL 4.3 4.1 4.4  AST 0 - 37 U/L '26 30 24  ' ALT 0 - 53 U/L '22 26 23  ' Alk Phosphatase 39 - 117 U/L 87 74 72  Total Bilirubin 0.2 - 1.2 mg/dL 0.9 0.8 0.6  Bilirubin, Direct 0.0 - 0.3 mg/dL 0.2 - -   CBC Latest Ref Rng 03/18/2014 03/06/2013 12/23/2009  WBC 4.0 - 10.5 K/uL 6.2 7.0 6.9  Hemoglobin 13.0 - 17.0 g/dL 14.9 15.4 16.0  Hematocrit 39.0 - 52.0 % 44.3 44.9 46.7  Platelets 150 - 400 K/uL 155 193 180   Lab Results  Component Value Date   MCV 98.0 03/18/2014   MCV 95.5 03/06/2013   MCV 97.7 12/23/2009   Lab Results  Component Value Date   TSH 1.159 03/18/2014   Lipid Panel     Component Value Date/Time   CHOL 151 01/14/2015 1048   CHOL 142 03/06/2013 1039   TRIG 72.0 01/14/2015 1048   TRIG 74 03/06/2013 1039   HDL 74.00 01/14/2015 1048   HDL 73 03/06/2013 1039   CHOLHDL 2 01/14/2015 1048   VLDL 14.4 01/14/2015 1048   LDLCALC 63 01/14/2015 1048   LDLCALC 54 03/06/2013 1039   RADIOLOGY: No results found.    ASSESSMENT AND PLAN: Mr. Jordan is a 69 year old white male who is 6 years status post his VF cardiac arrest secondary to total proximal occlusion of his of a dominant left circumflex artery artery. He has been demonstrated to have significant myocardial salvage in the lateral wall but does have mid to basal inferior to inferolateral scar. His last nuclear perfusion study did not reveal any wall motion abnormality and his ejection  fraction was 64% without evidence for ischemia.  Presently, he is without ischemic symptoms on his current medical regimen.  His blood pressure is well controlled on carvedilol, lisinopril, in  addition to isosorbide mononitrate.  I reviewed recent laboratory.  His Zetia/simvastatin combination is working well on his current LDL is 63.  He has a history of gout, but this is controlled with allopurinol and colchicine.   Denies recent symptoms.  He continues to be active and exercises several days per week.  He will continue his current medical regimen.  I will see him in one year for reevaluation.  Time spent: 25 minutes  Troy Sine, MD, [facility]  04/23/2015 7:17 PM

## 2015-05-05 ENCOUNTER — Other Ambulatory Visit: Payer: Self-pay | Admitting: Cardiovascular Disease

## 2015-05-12 ENCOUNTER — Ambulatory Visit (INDEPENDENT_AMBULATORY_CARE_PROVIDER_SITE_OTHER): Payer: Medicare Other

## 2015-05-12 DIAGNOSIS — Z23 Encounter for immunization: Secondary | ICD-10-CM

## 2015-07-16 DIAGNOSIS — L570 Actinic keratosis: Secondary | ICD-10-CM | POA: Diagnosis not present

## 2015-07-16 DIAGNOSIS — L821 Other seborrheic keratosis: Secondary | ICD-10-CM | POA: Diagnosis not present

## 2015-07-16 DIAGNOSIS — D225 Melanocytic nevi of trunk: Secondary | ICD-10-CM | POA: Diagnosis not present

## 2015-07-16 DIAGNOSIS — X32XXXD Exposure to sunlight, subsequent encounter: Secondary | ICD-10-CM | POA: Diagnosis not present

## 2015-09-11 ENCOUNTER — Other Ambulatory Visit: Payer: Self-pay | Admitting: Family Medicine

## 2016-01-12 ENCOUNTER — Other Ambulatory Visit: Payer: Self-pay | Admitting: Cardiovascular Disease

## 2016-01-12 NOTE — Telephone Encounter (Signed)
REFILL 

## 2016-02-16 ENCOUNTER — Other Ambulatory Visit: Payer: Self-pay | Admitting: Cardiovascular Disease

## 2016-02-16 NOTE — Telephone Encounter (Signed)
Rx request sent to pharmacy.  

## 2016-02-17 ENCOUNTER — Other Ambulatory Visit: Payer: Self-pay

## 2016-03-21 ENCOUNTER — Ambulatory Visit (INDEPENDENT_AMBULATORY_CARE_PROVIDER_SITE_OTHER): Payer: Medicare Other | Admitting: Family Medicine

## 2016-03-21 ENCOUNTER — Ambulatory Visit (INDEPENDENT_AMBULATORY_CARE_PROVIDER_SITE_OTHER)
Admission: RE | Admit: 2016-03-21 | Discharge: 2016-03-21 | Disposition: A | Discharge status: Home / Self Care | Payer: Medicare Other | Source: Ambulatory Visit | Attending: Family Medicine | Admitting: Family Medicine

## 2016-03-21 ENCOUNTER — Encounter: Payer: Self-pay | Admitting: Family Medicine

## 2016-03-21 VITALS — BP 148/90 | HR 64 | Temp 97.8°F | Ht 70.0 in | Wt 148.3 lb

## 2016-03-21 DIAGNOSIS — R634 Abnormal weight loss: Secondary | ICD-10-CM

## 2016-03-21 DIAGNOSIS — Z0000 Encounter for general adult medical examination without abnormal findings: Secondary | ICD-10-CM

## 2016-03-21 DIAGNOSIS — Z23 Encounter for immunization: Secondary | ICD-10-CM | POA: Diagnosis not present

## 2016-03-21 DIAGNOSIS — N320 Bladder-neck obstruction: Secondary | ICD-10-CM | POA: Diagnosis not present

## 2016-03-21 DIAGNOSIS — R5383 Other fatigue: Secondary | ICD-10-CM | POA: Diagnosis not present

## 2016-03-21 DIAGNOSIS — I2510 Atherosclerotic heart disease of native coronary artery without angina pectoris: Secondary | ICD-10-CM

## 2016-03-21 DIAGNOSIS — K4090 Unilateral inguinal hernia, without obstruction or gangrene, not specified as recurrent: Secondary | ICD-10-CM

## 2016-03-21 LAB — LIPID PANEL
CHOL/HDL RATIO: 2
Cholesterol: 149 mg/dL (ref 0–200)
HDL: 82.9 mg/dL (ref >39.00)
LDL Cholesterol: 53 mg/dL (ref 0–99)
NONHDL: 66.02
Triglycerides: 65 mg/dL (ref 0.0–149.0)
VLDL: 13 mg/dL (ref 0.0–40.0)

## 2016-03-21 LAB — CBC WITH DIFFERENTIAL/PLATELET
BASOS ABS: 0 10*3/uL (ref 0.0–0.1)
Basophils Relative: 0.5 % (ref 0.0–3.0)
EOS ABS: 0.2 10*3/uL (ref 0.0–0.7)
EOS PCT: 3.1 % (ref 0.0–5.0)
HCT: 43.5 % (ref 39.0–52.0)
Hemoglobin: 14.9 g/dL (ref 13.0–17.0)
LYMPHS ABS: 1.6 10*3/uL (ref 0.7–4.0)
LYMPHS PCT: 27.3 % (ref 12.0–46.0)
MCHC: 34.3 g/dL (ref 30.0–36.0)
MCV: 98.6 fl (ref 78.0–100.0)
MONO ABS: 0.9 10*3/uL (ref 0.1–1.0)
MONOS PCT: 16.5 % — AB (ref 3.0–12.0)
NEUTROS ABS: 3 10*3/uL (ref 1.4–7.7)
NEUTROS PCT: 52.6 % (ref 43.0–77.0)
PLATELETS: 157 10*3/uL (ref 150.0–400.0)
RBC: 4.42 Mil/uL (ref 4.22–5.81)
RDW: 14.8 % (ref 11.5–15.5)
WBC: 5.7 10*3/uL (ref 4.0–10.5)

## 2016-03-21 LAB — HEPATIC FUNCTION PANEL
ALT: 20 U/L (ref 0–53)
AST: 27 U/L (ref 0–37)
Albumin: 4.1 g/dL (ref 3.5–5.2)
Alkaline Phosphatase: 73 U/L (ref 39–117)
BILIRUBIN DIRECT: 0.2 mg/dL (ref 0.0–0.3)
BILIRUBIN TOTAL: 0.8 mg/dL (ref 0.2–1.2)
TOTAL PROTEIN: 7.3 g/dL (ref 6.0–8.3)

## 2016-03-21 LAB — BASIC METABOLIC PANEL
BUN: 10 mg/dL (ref 6–23)
CALCIUM: 9.4 mg/dL (ref 8.4–10.5)
CO2: 27 meq/L (ref 19–32)
CREATININE: 0.81 mg/dL (ref 0.40–1.50)
Chloride: 104 mEq/L (ref 96–112)
GFR: 100.39 mL/min (ref >60.00)
GLUCOSE: 91 mg/dL (ref 70–99)
Potassium: 4.3 mEq/L (ref 3.5–5.1)
SODIUM: 139 meq/L (ref 135–145)

## 2016-03-21 LAB — PSA: PSA: 0.27 ng/mL (ref 0.10–4.00)

## 2016-03-21 LAB — TSH: TSH: 0.68 u[IU]/mL (ref 0.35–4.50)

## 2016-03-21 IMAGING — DX DG CHEST 2V
3 series · 3 of 3 positions shown · non-contrast
Comparison: [DATE]

CLINICAL DATA: Unexplained weight loss of 20 pounds in 1 year,
history coronary artery disease post stenting and MI, smoker,
hyperlipidemia, gout

EXAM:
CHEST  2 VIEW

[chest pa (1 of 2)]
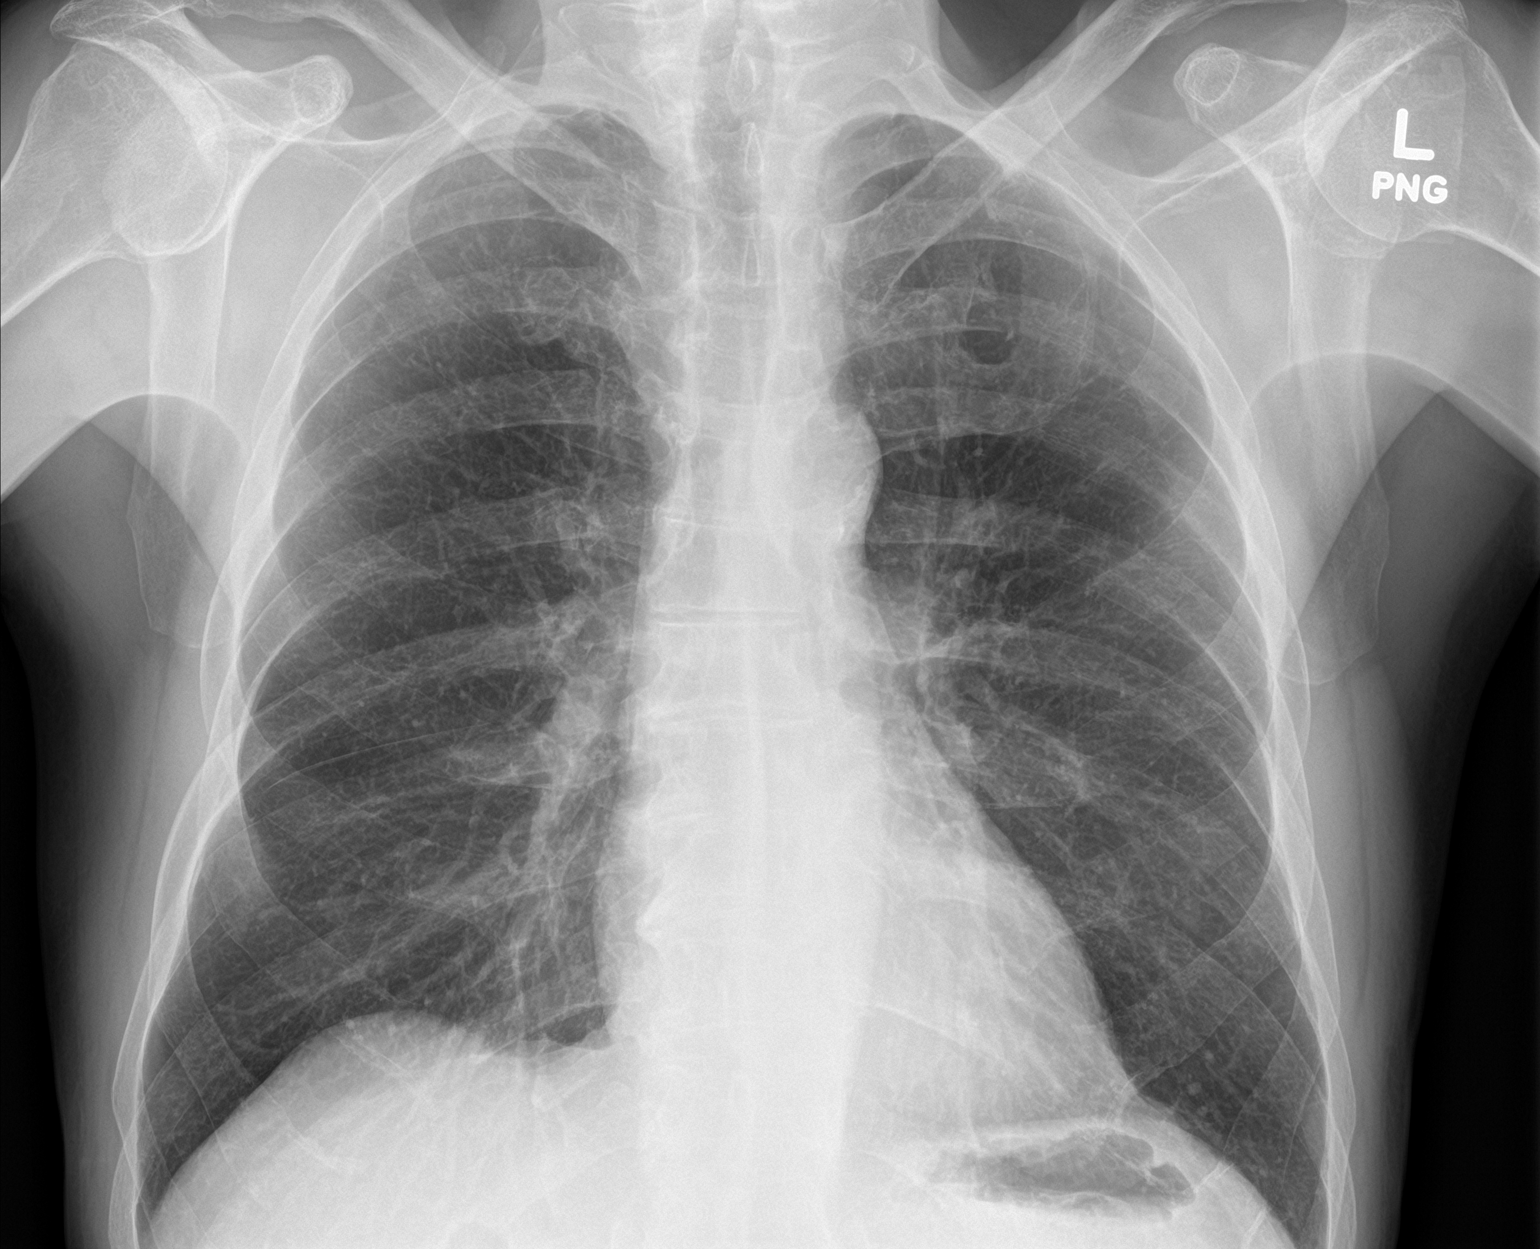

[chest lat]
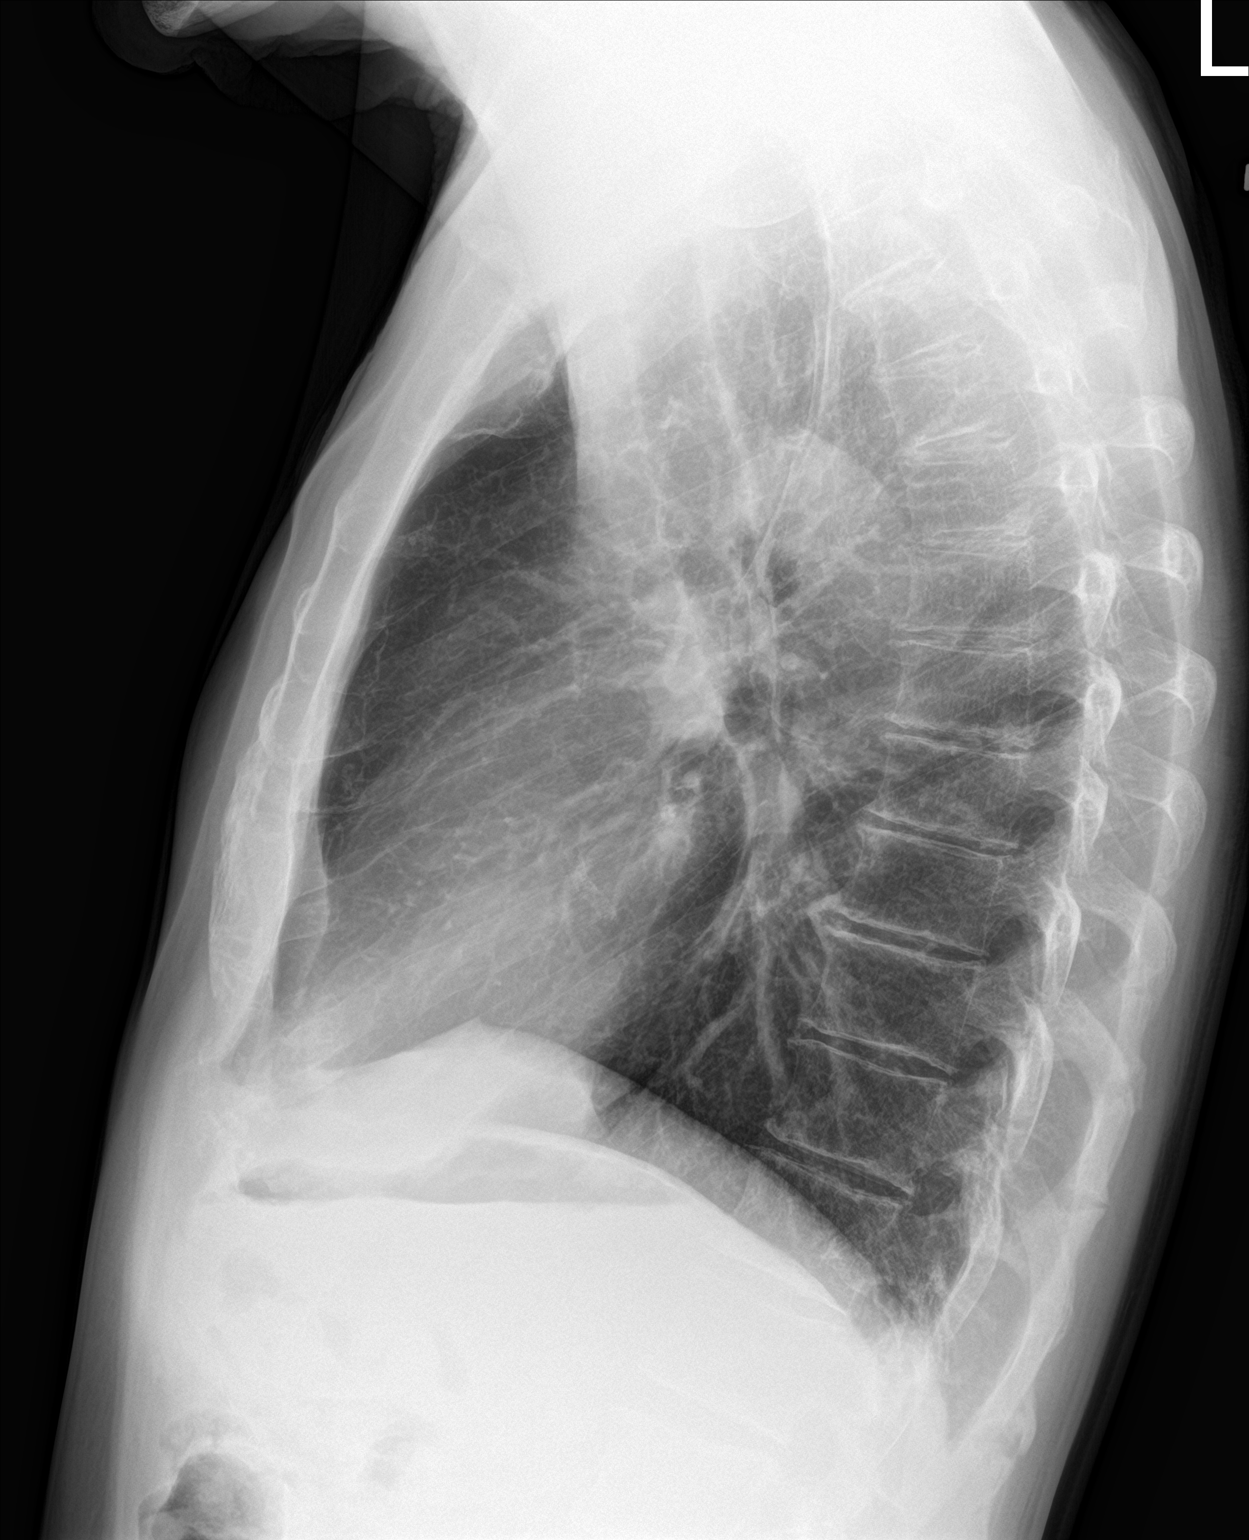

[chest pa (2 of 2)]
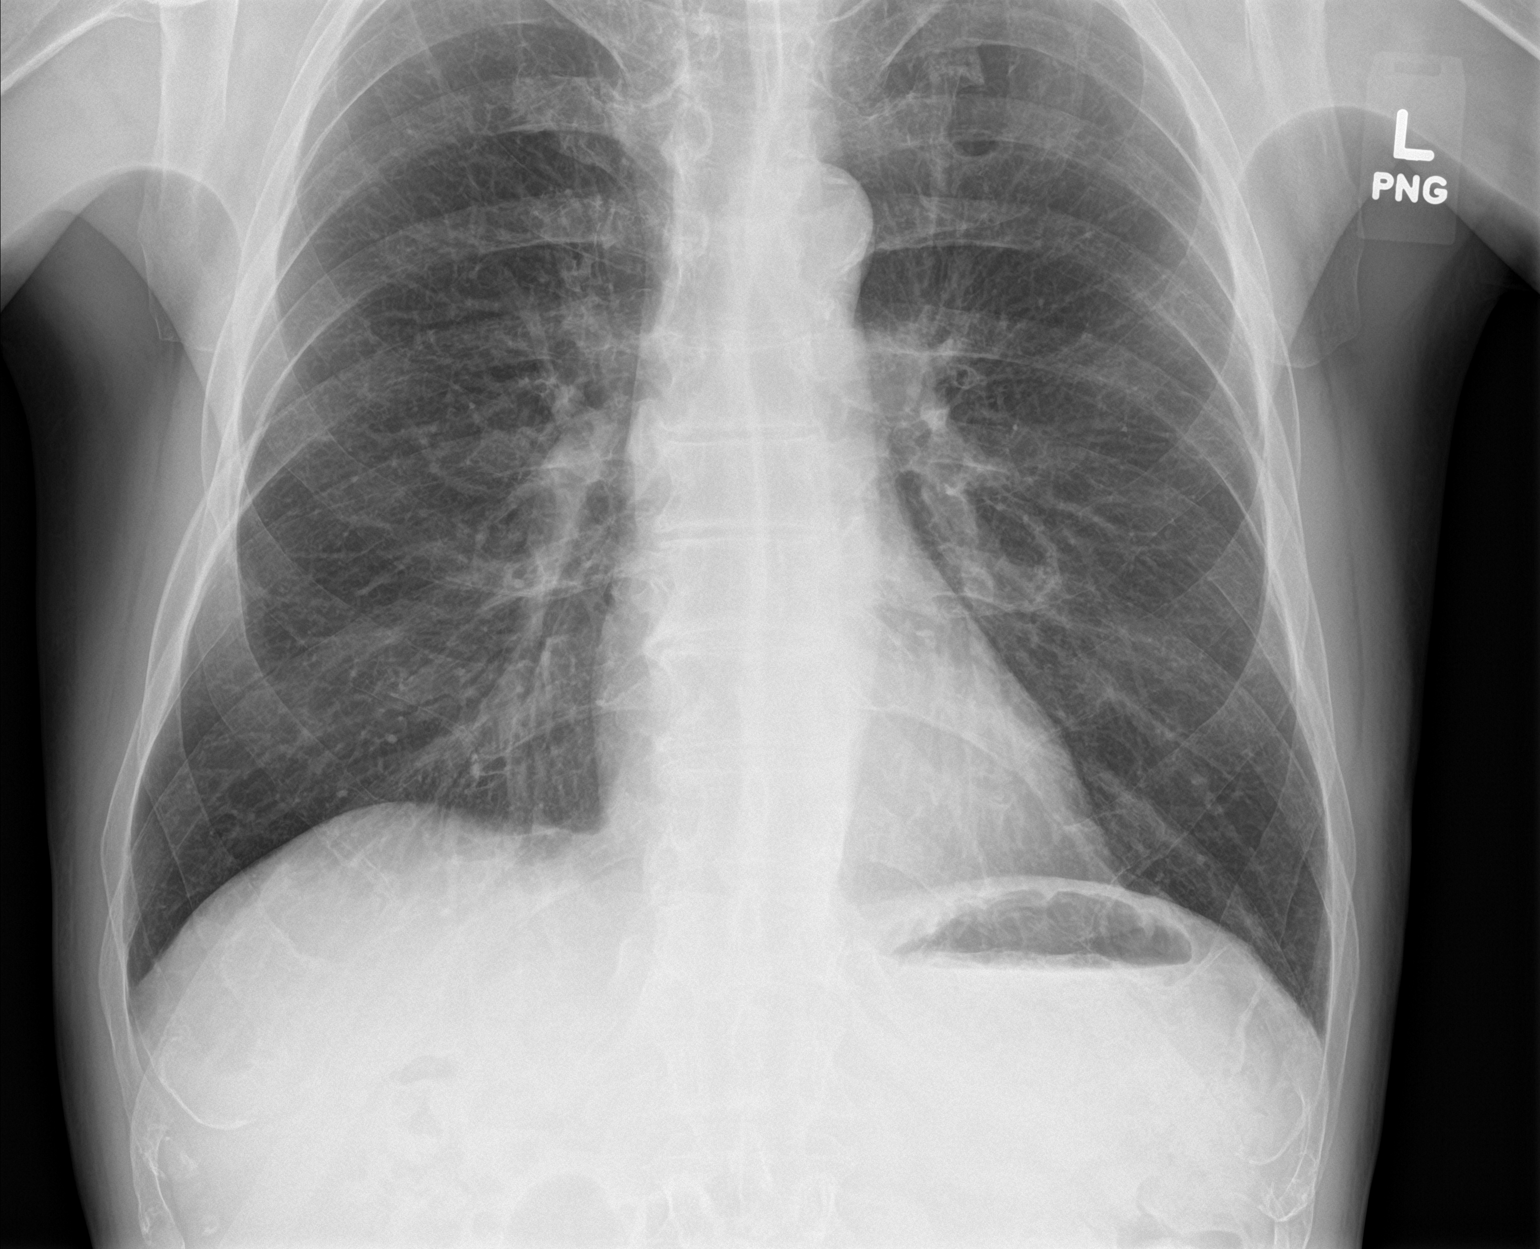

[3 of 3 positions shown; findings below may reference images not displayed]

FINDINGS: Normal heart size, mediastinal contours, and pulmonary vascularity.

Atherosclerotic calcification aorta.

Lungs appear mildly emphysematous but clear.

No infiltrate, pleural effusion or pneumothorax.

LEFT nipple shadow on first of 2 PA views, not seen on second.

Bones appear demineralized.
IMPRESSION: Question mild emphysematous changes without infiltrate.

## 2016-03-21 NOTE — Progress Notes (Signed)
Pre visit review using our clinic review tool, if applicable. No additional management support is needed unless otherwise documented below in the visit note. 

## 2016-03-21 NOTE — Progress Notes (Signed)
Subjective:     Patient ID: Jesus Jordan, male   DOB: 08/15/1945, 70 y.o.   MRN: [id number]  HPI Patient seen for Medicare subsequent annual wellness visit and medical follow-up. He has history of coronary artery disease, gout, hyperlipidemia.  He is followed regularly by cardiology. He's had some substantial weight loss in the past couple years and he attributed a lot of this to lifestyle changes with reducing overall calories and he also remains quite active. Plays tennis several times per week. No recent chest pains. No exercise intolerance. He states he has lost from about 215 pounds to current weight of 148 pounds. He has had slightly diminished appetite in recent months. He's had some increased fatigue but still exercises fairly regularly. No dyspnea with activity. Denies abdominal pain. No headaches. No cough. No change in bowel habits.  He has gout and is on prophylaxis with allopurinol. No recent flareups. Remains on simvastatin and Zetia for hyperlipidemia. No myalgias.  He has occasional dry cough which he attributes to ACE inhibitor. No hemoptysis. Long-term smoker and still smokes about half pack cigarettes per day. Needs Pneumovax. Had Prevnar 13 last year. Needs flu vaccine. Other immunizations up-to-date.  Patient has separate issue of new right inguinal bulge. Previous left inguinal hernia repair. He has occasional pain associated with this when he plays tennis. He would like to see surgeon again. He has had no problems with left side that was repaired  Past Medical History:  Diagnosis Date  . CAD 01/29/2009   Qualifier: Diagnosis of  By: [name] MD, Giavanni Zeitlin    . GOUT 01/29/2009   Qualifier: Diagnosis of  By: [name] MD, Pascal Stiggers    . Gout   . Heart disease    cardiologist Dr Claiborne Billings  . History of MI (myocardial infarction) 09/13/2006   large inferolateral MI secondary to total occlusion of circumflex  . History of nuclear stress test 02/23/2012   exercise myoview; area of scar in  inferolateral wall at mid-basal region  . Hyperlipidemia   . INGUINAL HERNIA 05/25/2009   Qualifier: Diagnosis of  By: [name] MD, Mckenze Slone     Past Surgical History:  Procedure Laterality Date  . CORONARY STENT PLACEMENT  09/12/2008   r/t MI; 3.5x64mm Xience DES to circumflex & PTCA at multiple sites in distal region of vessel; alos diffusely diseaase, narrowed, nondominant RCA  . FOOT SURGERY  1960  . HERNIA REPAIR  2011   left inguinal hernia  . TRANSTHORACIC ECHOCARDIOGRAM  03/02/2009   [id number]; LV systolic function borderline reduced with mild inferior wall hypokinesis; mild-mod MR; mild TR; AV mildly sclerotic; mild aortic root dilatation    reports that he has been smoking Cigarettes.  He has smoked for the past 30.00 years. He has never used smokeless tobacco. He reports that he drinks alcohol. He reports that he does not use drugs. family history includes Cancer in his paternal grandfather; Colon cancer in his father; Heart disease (age of onset: 52) in his brother; Hyperlipidemia in his brother. No Known Allergies  1.  Risk factors based on Past Medical , Social, and Family history reviewed and as indicated above with no changes 2.  Limitations in physical activities None.  No recent falls. Plays tennis regularly 3.  Depression/mood No active depression or anxiety issues 4.  Hearing No defiits 5.  ADLs independent in all. 6.  Cognitive function (orientation to time and place, language, writing, speech,memory) no short or long term memory issues.  Language and judgement intact. 7.  Home Safety no issues. 8.  Height, weight, and visual acuity. Recent weight loss as above 9.  Counseling discussed routine health maintenance vaccinations and screenings. 10. Recommendation of preventive services. Flu vaccine and Pneumovax given 11. Labs based on risk factors lipid, CBC, basic metabolic panel, TSH, PSA, urinalysis, hepatic 12. Care Plan as above 13. Other Providers Dr Claiborne Billings  Cardiology. 14. Written schedule of screening/prevention services given to patient.   Review of Systems  Constitutional: Positive for unexpected weight change. Negative for activity change, appetite change, chills, fatigue and fever.  HENT: Negative for congestion, ear pain, sore throat and trouble swallowing.   Eyes: Negative for pain and visual disturbance.  Respiratory: Negative for cough, shortness of breath and wheezing.   Cardiovascular: Negative for chest pain and palpitations.  Gastrointestinal: Negative for abdominal distention, abdominal pain, blood in stool, constipation, diarrhea, nausea, rectal pain and vomiting.  Endocrine: Negative for polydipsia and polyuria.  Genitourinary: Negative for dysuria, hematuria and testicular pain.  Musculoskeletal: Negative for arthralgias and joint swelling.  Skin: Negative for rash.  Neurological: Negative for dizziness, syncope, weakness and headaches.  Hematological: Negative for adenopathy. Bruises/bleeds easily.  Psychiatric/Behavioral: Negative for confusion and dysphoric mood.       Objective:   Physical Exam  Constitutional: He is oriented to person, place, and time. He appears well-developed and well-nourished. No distress.  HENT:  Head: Normocephalic and atraumatic.  Right Ear: External ear normal.  Left Ear: External ear normal.  Mouth/Throat: Oropharynx is clear and moist.  Eyes: Conjunctivae and EOM are normal. Pupils are equal, round, and reactive to light.  Neck: Normal range of motion. Neck supple. No thyromegaly present.  Cardiovascular: Normal rate, regular rhythm and normal heart sounds.   No murmur heard. Pulmonary/Chest: No respiratory distress. He has no wheezes. He has no rales.  Abdominal: Soft. Bowel sounds are normal. He exhibits no distension and no mass. There is no tenderness. There is no rebound and no guarding.  Musculoskeletal: He exhibits no edema.  Lymphadenopathy:    He has no cervical adenopathy.   Neurological: He is alert and oriented to person, place, and time. He displays normal reflexes. No cranial nerve deficit.  Skin: No rash noted.  Several areas of ecchymosis on both forearms  Psychiatric: He has a normal mood and affect.       Assessment:     #1 Medicare subsequent annual wellness visit  #2 history of CAD followed by cardiology  #3 dyslipidemia  #4 history of gout  #5 weight loss. At least some of this seems to be intentional related to lifestyle changes  #6 ongoing nicotine use  #Right inguinal hernia    Plan:     -Flu vaccine and Pneumovax given -Set up referral to general surgeon regarding right angle hernia -Obtain lab work including CBC, TSH, hepatic, basic metabolic panel, lipid -The natural history of prostate cancer and ongoing controversy regarding screening and potential treatment outcomes of prostate cancer has been discussed with the patient. The meaning of a false positive PSA and a false negative PSA has been discussed. He indicates understanding of the limitations of this screening test and wishes to proceed with screening PSA testing. -Obtain chest x-Dantonio given long history of nicotine use and recent weight loss   Eulas Post MD [facility]

## 2016-04-13 ENCOUNTER — Telehealth: Payer: Self-pay | Admitting: Cardiovascular Disease

## 2016-04-13 DIAGNOSIS — Z7901 Long term (current) use of anticoagulants: Secondary | ICD-10-CM | POA: Diagnosis not present

## 2016-04-13 DIAGNOSIS — K4090 Unilateral inguinal hernia, without obstruction or gangrene, not specified as recurrent: Secondary | ICD-10-CM | POA: Diagnosis not present

## 2016-04-13 DIAGNOSIS — K642 Third degree hemorrhoids: Secondary | ICD-10-CM | POA: Diagnosis not present

## 2016-04-13 NOTE — Telephone Encounter (Signed)
Jesus Jordan stopped by saying he just saw Dr. Lucia Gaskins (a surgeon) who suggested he ask Dr. Claiborne Billings if he can stop taking Plavix for seven days. He has hemorrhoids and thinks taking Plavix has something to do with this. Please give him a phone call regarding this.  Pt's ph# [phone] Thank you.

## 2016-04-13 NOTE — Telephone Encounter (Signed)
Returned call to patient.He has been having bleeding hemorrhoids and Dr.Newman wanted him to ask Dr.Kelly if he could stop Plavix for 7 days to see if this would help.Advised Dr.Kelly out of office.I will send message to [facility] for advice.

## 2016-04-19 NOTE — Telephone Encounter (Signed)
Ok to hold plavix 

## 2016-04-20 NOTE — Telephone Encounter (Signed)
Spoke to patient and Dr.David Newman's office Dr.Kelly advised ok to hold Plavix 7 days before upcoming surgery.

## 2016-05-03 ENCOUNTER — Other Ambulatory Visit: Payer: Self-pay | Admitting: Family Medicine

## 2016-05-03 ENCOUNTER — Other Ambulatory Visit: Payer: Self-pay | Admitting: Cardiovascular Disease

## 2016-05-03 ENCOUNTER — Ambulatory Visit (INDEPENDENT_AMBULATORY_CARE_PROVIDER_SITE_OTHER): Payer: Medicare Other | Admitting: Cardiovascular Disease

## 2016-05-03 ENCOUNTER — Encounter: Payer: Self-pay | Admitting: Cardiovascular Disease

## 2016-05-03 VITALS — BP 100/66 | HR 61 | Ht 68.0 in | Wt 151.0 lb

## 2016-05-03 DIAGNOSIS — E785 Hyperlipidemia, unspecified: Secondary | ICD-10-CM

## 2016-05-03 DIAGNOSIS — K219 Gastro-esophageal reflux disease without esophagitis: Secondary | ICD-10-CM

## 2016-05-03 DIAGNOSIS — I2510 Atherosclerotic heart disease of native coronary artery without angina pectoris: Secondary | ICD-10-CM | POA: Diagnosis not present

## 2016-05-03 HISTORY — DX: Atherosclerotic heart disease of native coronary artery without angina pectoris: I25.10

## 2016-05-03 NOTE — Progress Notes (Signed)
Patient ID: Jesus Jordan, male   DOB: 1946/02/21, 70 y.o.   MRN: [id number]    PCP: [name] HPI: Jesus Jordan is a 70 y.o. male  who presents to the office today for one-year cardiology evaluation.  [name] suffered a large inferolateral myocardial infarction on 09/12/2008 and presented to [facility] with recurrent episodes of ventricular fibrillation and required numerous defibrillator shocks along with CPR.  I was able to successfully perform intervention to a totally occluded proximal left circumflex coronary artery. This was stented and more distally he had diffuse disease with joint and underwent PTCA at multiple sites in the very distal aspect of this dominant circumflex system. His RCA  was diffusely diseased with stenoses of 90% and was nondominant. His LAD was normal. He had undergone an initial stress test in one year showed an area of scar and inferolateral wall at the mid to basal region. He subsequently was taken off his Effient and then switched to Plavix. A nuclear perfusion study in September 2012 was unchanged from previously and showed the small moderate region of the mid to basal inferior inferolateral scar without associated ischemia.  On 03/04/2014 a three-year followup nuclear perfusion study continued to be low risk and only showed a small fixed inferior defect without reversible ischemia.  He had normal wall motion.  Ejection fraction was 64%.  I last saw him one year ago.  Over the past year he has remained fairly active.  He is retired and is doing Architect work.  He is developed a right inguinal hernia and is in need for repair.  He had previously undergone repair of a left inguinal hernia by Dr. Ezzard Jordan.  He will be seeing him for right inguinal hernia surgery.  Because of his hernia.  He has not been playing tennis as much as he had in the past.  He denies any episodes of chest pain.  He denies palpitations.  He denies presyncope or syncope.    He has  continued to be on aspirin and Plavix for antiplatelet therapy.  He is on carvedilol 12.5 twice a day, lisinopril 10 mg daily and continues to take low-dose isosorbide mononitrate 30 mg.  He has been without recurrent anginal symptomatology.  He is on combination therapy with Zetia 10 mg and simvastatin 40 mg for hyperlipidemia.  He presents for evaluation.    Past Medical History:  Diagnosis Date  . CAD 01/29/2009   Qualifier: Diagnosis of  By: [name], [name]    . GOUT 01/29/2009   Qualifier: Diagnosis of  By: [name], [name]    . Gout   . Heart disease    cardiologist [name]  . History of MI (myocardial infarction) 09/13/2006   large inferolateral MI secondary to total occlusion of circumflex  . History of nuclear stress test 02/23/2012   exercise myoview; area of scar in inferolateral wall at mid-basal region  . Hyperlipidemia   . INGUINAL HERNIA 05/25/2009   Qualifier: Diagnosis of  By: [name], [name]      Past Surgical History:  Procedure Laterality Date  . CORONARY STENT PLACEMENT  09/12/2008   r/t MI; 3.5x61mm Xience DES to circumflex & PTCA at multiple sites in distal region of vessel; alos diffusely diseaase, narrowed, nondominant RCA  . FOOT SURGERY  1960  . HERNIA REPAIR  2011   left inguinal hernia  . TRANSTHORACIC ECHOCARDIOGRAM  03/02/2009   EF=50-55%; LV systolic function borderline reduced with mild inferior wall hypokinesis; mild-mod  MR; mild TR; AV mildly sclerotic; mild aortic root dilatation    No Known Allergies  Current Outpatient Prescriptions  Medication Sig Dispense Refill  . allopurinol (ZYLOPRIM) 300 MG tablet TAKE ONE TABLET BY MOUTH ONCE DAILY 30 tablet 5  . aspirin 81 MG tablet Take 81 mg by mouth daily.    . carvedilol (COREG) 12.5 MG tablet TAKE ONE TABLET BY MOUTH TWICE DAILY 60 tablet 11  . clopidogrel (PLAVIX) 75 MG tablet TAKE ONE TABLET BY MOUTH ONCE DAILY 30 tablet 11  . colchicine (COLCRYS) 0.6 MG tablet Take 1 tablet (0.6 mg  total) by mouth 2 (two) times daily. 180 tablet 1  . fluticasone (CUTIVATE) 0.05 % cream     . isosorbide mononitrate (IMDUR) 30 MG 24 hr tablet TAKE ONE TABLET (30 MG TOTAL) BY MOUTH ONCE DAILY 30 tablet 2  . lisinopril (PRINIVIL,ZESTRIL) 10 MG tablet TAKE ONE TABLET BY MOUTH ONCE DAILY 30 tablet 11  . NITROSTAT 0.4 MG SL tablet PLACE 1 TAB (0.'4MG'$ ) TOTAL UNDER THE TONGUE EVERY 5 MINUTES AS NEEDED FOR CHEST PAIN (NEEDS OFFICE VISIT) 25 tablet 4  . pantoprazole (PROTONIX) 40 MG tablet TAKE ONE TABLET BY MOUTH ONCE DAILY AS NEEDED 30 tablet 11  . simvastatin (ZOCOR) 40 MG tablet TAKE ONE TABLET BY MOUTH AT BEDTIME 30 tablet 11  . ZETIA 10 MG tablet TAKE ONE TABLET BY MOUTH ONCE DAILY 30 tablet 11   Current Facility-Administered Medications  Medication Dose Route Frequency Provider Last Rate Last Dose  . zoster vaccine live (PF) (ZOSTAVAX) injection 19,400 Units  0.65 mL Subcutaneous Once Eulas Post, MD        Social History   Social History  . Marital status: Single    Spouse name: N/A  . Number of children: 3  . Years of education: N/A   Occupational History  . Not on file.   Social History Main Topics  . Smoking status: Current Some Day Smoker    Years: 30.00    Types: Cigarettes  . Smokeless tobacco: Never Used  . Alcohol use Yes  . Drug use: No  . Sexual activity: Not on file   Other Topics Concern  . Not on file   Social History Narrative  . No narrative on file   Social history is notable that he is divorced.  His daughter is now working in [employer].  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  There is no present tobacco use.  Family History  Problem Relation Age of Onset  . Colon cancer Father   . Hyperlipidemia Brother   . Heart disease Brother 4    massive MI  . Cancer Paternal Grandfather     colon   Socially he is divorced. Has 3 children and 2 grandchildren. No tobacco use. He does drink occasional alcohol.  He does play tennis.  His daughter, Jesus Jordan is working at [employer] in [employer] in [employer].  ROS General: Negative; No fevers, chills, or night sweats;  HEENT: Negative; No changes in vision or hearing, sinus congestion, difficulty swallowing Pulmonary: Negative; No cough, wheezing, shortness of breath, hemoptysis Cardiovascular: Negative; No chest pain, presyncope, syncope, palpitations GI: Negative; No nausea, vomiting, diarrhea, or abdominal pain GU: Positive for right inguinal hernia No dysuria, hematuria, or difficulty voiding Musculoskeletal: Negative; no myalgias, joint pain, or weakness He has a history of gout, but denies any recurrence. Hematologic/Oncology: Negative; no easy bruising, bleeding Endocrine: Negative; no heat/cold intolerance; no diabetes Neuro:  Negative; no changes in balance, headaches Skin: Negative; No rashes or skin lesions Psychiatric: Negative; No behavioral problems, depression Sleep: Negative; No snoring, daytime sleepiness, hypersomnolence, bruxism, restless legs, hypnogognic hallucinations, no cataplexy Other comprehensive 14 point system review is negative.  PE BP 100/66   Pulse 61   Ht 5' 8" (1.727 m)   Wt 151 lb (68.5 kg)   BMI 22.96 kg/m    Repeat blood pressure by me was 130/78; there was no orthostatic change.  Wt Readings from Last 3 Encounters:  05/03/16 151 lb (68.5 kg)  03/21/16 148 lb 4.8 oz (67.3 kg)  04/23/15 166 lb (75.3 kg)   General: Alert, oriented, no distress.  Skin: normal turgor, no rashes HEENT: Normocephalic, atraumatic. Pupils round and reactive; sclera anicteric;no lid lag.  Nose without nasal septal hypertrophy Mouth/Parynx benign; Mallinpatti scale 2/3 Neck: No JVD, no carotid bruits with normal carotid upstroke Lungs: clear to ausculatation and percussion; no wheezing or rales Chest wall: Nontender to palpation Heart: RRR, s1 s2 normal 1/6 systolic murmur Abdomen: soft, nontender; no hepatosplenomehaly, BS+;  abdominal aorta nontender and not dilated by palpation. Right inguinal hernia. Pulses 2+; no femoral artery bruits. Back: No CVA tenderness Extremities: no clubbing cyanosis or edema, Homan's sign negative  Neurologic: grossly nonfocal Psychological: Normal affect and mood  ECG (independently read by me): Normal sinus rhythm at 61 bpm.  Borderline first-degree AV block with a PR interval of 210 ms.  QTc interval normal at 390 ms.  November 2016 ECG (independently read by me): Normal sinus rhythm at 61 bpm.  No ectopy.  Normal intervals.  No ECG evidence of prior myocardial infarction.  September 2015 ECG (independently read by me): Sinus bradycardia 57 beats per minute.  Borderline first-degree AV block with PR interval of 204 ms.  Prior ECG: Sinus rhythm at 63 beats per minute. Normal intervals.  LABS:  BMP Latest Ref Rng & Units 03/21/2016 01/14/2015 03/18/2014  Glucose 70 - 99 mg/dL 91 88 101(H)  BUN 6 - 23 mg/dL _0 Creatinine 0.40 - 1.50 mg/dL 0.81 0.94 1.07  Sodium 135 - 145 mEq/L 139 140 140  Potassium 3.5 - 5.1 mEq/L 4.3 4.7 5.0  Chloride 96 - 112 mEq/L 104 103 105  CO2 19 - 32 mEq/L _1 Calcium 8.4 - 10.5 mg/dL 9.4 9.7 9.7   Hepatic Function Latest Ref Rng & Units 03/21/2016 01/14/2015 03/18/2014  Total Protein 6.0 - 8.3 g/dL 7.3 7.2 6.9  Albumin 3.5 - 5.2 g/dL 4.1 4.3 4.1  AST 0 - 37 U/L _2 ALT 0 - 53 U/L _3 Alk Phosphatase 39 - 117 U/L 73 87 74  Total Bilirubin 0.2 - 1.2 mg/dL 0.8 0.9 0.8  Bilirubin, Direct 0.0 - 0.3 mg/dL 0.2 0.2 -   CBC Latest Ref Rng & Units 03/21/2016 03/18/2014 03/06/2013  WBC 4.0 - 10.5 K/uL 5.7 6.2 7.0  Hemoglobin 13.0 - 17.0 g/dL 14.9 14.9 15.4  Hematocrit 39.0 - 52.0 % 43.5 44.3 44.9  Platelets 150.0 - 400.0 K/uL 157.0 155 193   Lab Results  Component Value Date   MCV 98.6 03/21/2016   MCV 98.0 03/18/2014   MCV 95.5 03/06/2013   Lab Results  Component Value Date   TSH 0.68 03/21/2016   Lipid Panel       Component Value Date/Time   CHOL 149 03/21/2016 1219   CHOL 142 03/06/2013 1039   TRIG 65.0 03/21/2016 1219   TRIG  74 03/06/2013 1039   HDL 82.90 03/21/2016 1219   HDL 73 03/06/2013 1039   CHOLHDL 2 03/21/2016 1219   VLDL 13.0 03/21/2016 1219   LDLCALC 53 03/21/2016 1219   LDLCALC 54 03/06/2013 1039   RADIOLOGY: No results found.    ASSESSMENT AND PLAN: Mr. Mintzer is a 70 year old white male who is 7 1/2 years status post his VF cardiac arrest secondary to total proximal occlusion of his of a dominant left circumflex artery artery. He has been demonstrated to have significant myocardial salvage in the lateral wall but does have mid to basal inferior to inferolateral scar. His last nuclear perfusion study in 2015  did not reveal any wall motion abnormality and his ejection fraction was 64% without evidence for ischemia.  Presently, he continues to do well.  He is without episodes of chest pain or schemic symptoms on his current medical regimen.  His blood pressure is well controlled on carvedilol, lisinopril, in addition to isosorbide mononitrate.  I reviewed recent laboratory which was done by Dr. [name].  His lipid studies continue to be excellent with a total cholesterol 149, LDL cholesterol at 53.  His renal function is stable with a creatinine of 0.81, potassium of 4.3.  His normal thyroid function.He has GERD which is controlled with pantoprazole 40 mg which he takes as needed.  There is remote history of gout. I am giving him clearance for him to undergo his right inguinal hernia surgery to be done by Dr. Lucia Gaskins.  He will need to hold his Plavix for at least 5 days prior to the procedure.  As long as he remains stable I will see him in one year for cardiology reevaluation.  Time spent: 25 minutes  Troy Sine, MD, [facility]  05/03/2016 10:32 AM

## 2016-05-03 NOTE — Telephone Encounter (Signed)
REFILL 

## 2016-05-03 NOTE — Patient Instructions (Addendum)
Your physician wants you to follow-up in: 1 year or sooner if needed. You will receive a reminder letter in the mail two months in advance. If you don't receive a letter, please call our office to schedule the follow-up appointment.  If you need a refill on your cardiac medications before your next appointment, please call your pharmacy.  You have been cleared to have your planned surgery.

## 2016-05-04 NOTE — Addendum Note (Signed)
Addended by: Leland Johns A on: 05/04/2016 05:39 PM   Modules accepted: Orders

## 2016-05-17 DIAGNOSIS — C4442 Squamous cell carcinoma of skin of scalp and neck: Secondary | ICD-10-CM | POA: Diagnosis not present

## 2016-05-23 DIAGNOSIS — K4090 Unilateral inguinal hernia, without obstruction or gangrene, not specified as recurrent: Secondary | ICD-10-CM | POA: Diagnosis not present

## 2016-06-03 ENCOUNTER — Encounter: Payer: Self-pay | Admitting: *Deleted

## 2016-06-03 ENCOUNTER — Other Ambulatory Visit: Payer: Self-pay | Admitting: Cardiovascular Disease

## 2016-06-03 NOTE — Telephone Encounter (Signed)
This encounter was created in error - please disregard.

## 2016-06-07 DIAGNOSIS — L820 Inflamed seborrheic keratosis: Secondary | ICD-10-CM | POA: Diagnosis not present

## 2016-06-07 DIAGNOSIS — X32XXXD Exposure to sunlight, subsequent encounter: Secondary | ICD-10-CM | POA: Diagnosis not present

## 2016-06-07 DIAGNOSIS — Z08 Encounter for follow-up examination after completed treatment for malignant neoplasm: Secondary | ICD-10-CM | POA: Diagnosis not present

## 2016-06-07 DIAGNOSIS — L570 Actinic keratosis: Secondary | ICD-10-CM | POA: Diagnosis not present

## 2016-06-07 DIAGNOSIS — Z85828 Personal history of other malignant neoplasm of skin: Secondary | ICD-10-CM | POA: Diagnosis not present

## 2016-06-28 DIAGNOSIS — Z85828 Personal history of other malignant neoplasm of skin: Secondary | ICD-10-CM | POA: Diagnosis not present

## 2016-06-28 DIAGNOSIS — Z08 Encounter for follow-up examination after completed treatment for malignant neoplasm: Secondary | ICD-10-CM | POA: Diagnosis not present

## 2016-06-28 DIAGNOSIS — D225 Melanocytic nevi of trunk: Secondary | ICD-10-CM | POA: Diagnosis not present

## 2016-06-28 DIAGNOSIS — L570 Actinic keratosis: Secondary | ICD-10-CM | POA: Diagnosis not present

## 2016-06-28 DIAGNOSIS — X32XXXD Exposure to sunlight, subsequent encounter: Secondary | ICD-10-CM | POA: Diagnosis not present

## 2016-07-08 ENCOUNTER — Telehealth: Payer: Self-pay | Admitting: Cardiovascular Disease

## 2016-07-08 NOTE — Telephone Encounter (Signed)
New message   Pt and pharmacy wants to know if they can use generic brand for pr medication Zetia 10mg 

## 2016-07-08 NOTE — Telephone Encounter (Signed)
Spoke with pharmacy staff. Advised if patient prefers generic zetia, OK to fill.

## 2016-07-12 DIAGNOSIS — L570 Actinic keratosis: Secondary | ICD-10-CM | POA: Diagnosis not present

## 2016-07-12 DIAGNOSIS — C4442 Squamous cell carcinoma of skin of scalp and neck: Secondary | ICD-10-CM | POA: Diagnosis not present

## 2016-07-19 DIAGNOSIS — C44329 Squamous cell carcinoma of skin of other parts of face: Secondary | ICD-10-CM | POA: Diagnosis not present

## 2016-07-19 DIAGNOSIS — Z85828 Personal history of other malignant neoplasm of skin: Secondary | ICD-10-CM | POA: Diagnosis not present

## 2016-07-21 DIAGNOSIS — L570 Actinic keratosis: Secondary | ICD-10-CM | POA: Diagnosis not present

## 2016-07-21 DIAGNOSIS — Z85828 Personal history of other malignant neoplasm of skin: Secondary | ICD-10-CM | POA: Diagnosis not present

## 2016-09-01 DIAGNOSIS — L821 Other seborrheic keratosis: Secondary | ICD-10-CM | POA: Diagnosis not present

## 2016-09-01 DIAGNOSIS — D692 Other nonthrombocytopenic purpura: Secondary | ICD-10-CM | POA: Diagnosis not present

## 2016-09-01 DIAGNOSIS — D225 Melanocytic nevi of trunk: Secondary | ICD-10-CM | POA: Diagnosis not present

## 2016-09-01 DIAGNOSIS — Z85828 Personal history of other malignant neoplasm of skin: Secondary | ICD-10-CM | POA: Diagnosis not present

## 2016-09-01 DIAGNOSIS — D1801 Hemangioma of skin and subcutaneous tissue: Secondary | ICD-10-CM | POA: Diagnosis not present

## 2016-09-01 DIAGNOSIS — L570 Actinic keratosis: Secondary | ICD-10-CM | POA: Diagnosis not present

## 2016-09-05 ENCOUNTER — Other Ambulatory Visit: Payer: Self-pay | Admitting: Family Medicine

## 2017-01-03 ENCOUNTER — Other Ambulatory Visit: Payer: Self-pay | Admitting: Cardiovascular Disease

## 2017-03-07 DIAGNOSIS — L821 Other seborrheic keratosis: Secondary | ICD-10-CM | POA: Diagnosis not present

## 2017-03-07 DIAGNOSIS — Z85828 Personal history of other malignant neoplasm of skin: Secondary | ICD-10-CM | POA: Diagnosis not present

## 2017-03-07 DIAGNOSIS — L570 Actinic keratosis: Secondary | ICD-10-CM | POA: Diagnosis not present

## 2017-03-07 DIAGNOSIS — L718 Other rosacea: Secondary | ICD-10-CM | POA: Diagnosis not present

## 2017-03-07 DIAGNOSIS — D485 Neoplasm of uncertain behavior of skin: Secondary | ICD-10-CM | POA: Diagnosis not present

## 2017-03-07 DIAGNOSIS — D692 Other nonthrombocytopenic purpura: Secondary | ICD-10-CM | POA: Diagnosis not present

## 2017-03-08 ENCOUNTER — Other Ambulatory Visit: Payer: Self-pay | Admitting: Family Medicine

## 2017-03-08 ENCOUNTER — Other Ambulatory Visit: Payer: Self-pay | Admitting: Cardiovascular Disease

## 2017-04-08 ENCOUNTER — Other Ambulatory Visit: Payer: Self-pay | Admitting: Cardiovascular Disease

## 2017-04-20 ENCOUNTER — Ambulatory Visit (INDEPENDENT_AMBULATORY_CARE_PROVIDER_SITE_OTHER): Payer: Medicare Other

## 2017-04-20 DIAGNOSIS — Z23 Encounter for immunization: Secondary | ICD-10-CM

## 2017-06-12 ENCOUNTER — Other Ambulatory Visit: Payer: Self-pay | Admitting: Cardiovascular Disease

## 2017-07-10 ENCOUNTER — Ambulatory Visit (INDEPENDENT_AMBULATORY_CARE_PROVIDER_SITE_OTHER): Payer: Medicare Other | Admitting: Family Medicine

## 2017-07-10 VITALS — BP 120/70 | HR 64 | Temp 97.3°F | Ht 68.0 in | Wt 147.7 lb

## 2017-07-10 DIAGNOSIS — I2510 Atherosclerotic heart disease of native coronary artery without angina pectoris: Secondary | ICD-10-CM

## 2017-07-10 DIAGNOSIS — Z136 Encounter for screening for cardiovascular disorders: Secondary | ICD-10-CM

## 2017-07-10 DIAGNOSIS — Z1159 Encounter for screening for other viral diseases: Secondary | ICD-10-CM

## 2017-07-10 DIAGNOSIS — E785 Hyperlipidemia, unspecified: Secondary | ICD-10-CM

## 2017-07-10 DIAGNOSIS — M1A9XX0 Chronic gout, unspecified, without tophus (tophi): Secondary | ICD-10-CM

## 2017-07-10 DIAGNOSIS — Z122 Encounter for screening for malignant neoplasm of respiratory organs: Secondary | ICD-10-CM | POA: Diagnosis not present

## 2017-07-10 NOTE — Progress Notes (Signed)
Subjective:     Patient ID: Jesus Jordan, male   DOB: [birth year], 72 y.o.   MRN: [id number]  HPI Patient initially was set up for Medicare wellness visit. We explained we would like him to get this through our health coach. We did focus on some preventative issues as below but ended up making this a problem oriented visit  He has history of MI, hyperlipidemia, gout. He has lost about 70 pounds since he was diagnosed with MI in the past and has done this intentionally. He exercises with tennis mostly. No recent chest pains. He's had some lightheadedness recently which he thinks may be related his blood pressure falling since he's lost some weight. Medications reviewed. Compliant with all.  Over a year since his lipids were last checked. Takes combination of simvastatin and Zetia.  Unfortunately, still smokes about 30 pack cigarettes per day. Over 30 pack year history. Denies any recent cough or hemoptysis.  Past Medical History:  Diagnosis Date  . CAD 01/29/2009   Qualifier: Diagnosis of  By: [name] MD, Jasir Rother    . GOUT 01/29/2009   Qualifier: Diagnosis of  By: [name] MD, Kelle Ruppert    . Gout   . Heart disease    cardiologist Dr Claiborne Billings  . History of MI (myocardial infarction) 09/13/2006   large inferolateral MI secondary to total occlusion of circumflex  . History of nuclear stress test 02/23/2012   exercise myoview; area of scar in inferolateral wall at mid-basal region  . Hyperlipidemia   . INGUINAL HERNIA 05/25/2009   Qualifier: Diagnosis of  By: [name] MD, Darothy Courtright     Past Surgical History:  Procedure Laterality Date  . CORONARY STENT PLACEMENT  09/12/2008   r/t MI; 3.5x41mm Xience DES to circumflex & PTCA at multiple sites in distal region of vessel; alos diffusely diseaase, narrowed, nondominant RCA  . FOOT SURGERY  1960  . HERNIA REPAIR  2011   left inguinal hernia  . TRANSTHORACIC ECHOCARDIOGRAM  03/02/2009   CH=85-27%; LV systolic function borderline reduced with mild inferior wall  hypokinesis; mild-mod MR; mild TR; AV mildly sclerotic; mild aortic root dilatation    reports that he has been smoking cigarettes.  He has smoked for the past 30.00 years. he has never used smokeless tobacco. He reports that he drinks alcohol. He reports that he does not use drugs. family history includes Cancer in his paternal grandfather; Colon cancer in his father; Heart disease (age of onset: 18) in his brother; Hyperlipidemia in his brother. No Known Allergies   Review of Systems  Constitutional: Negative for activity change, appetite change, fatigue and fever.  HENT: Negative for congestion, ear pain and trouble swallowing.   Eyes: Negative for pain and visual disturbance.  Respiratory: Negative for cough, chest tightness, shortness of breath and wheezing.   Cardiovascular: Negative for chest pain, palpitations and leg swelling.  Gastrointestinal: Negative for abdominal distention, abdominal pain, blood in stool, constipation, diarrhea, nausea, rectal pain and vomiting.  Endocrine: Negative for polydipsia and polyuria.  Genitourinary: Negative for dysuria, hematuria and testicular pain.  Musculoskeletal: Negative for arthralgias and joint swelling.  Skin: Negative for rash.  Neurological: Negative for dizziness, syncope, weakness, light-headedness and headaches.  Hematological: Negative for adenopathy.  Psychiatric/Behavioral: Negative for confusion and dysphoric mood.       Objective:   Physical Exam  Constitutional: He is oriented to person, place, and time. He appears well-developed and well-nourished.  HENT:  Right Ear: External ear normal.  Left Ear: External ear normal.  Mouth/Throat: Oropharynx is clear and moist.  Eyes: Pupils are equal, round, and reactive to light.  Neck: Neck supple. No thyromegaly present.  Cardiovascular: Normal rate and regular rhythm.  Pulmonary/Chest: Effort normal and breath sounds normal. No respiratory distress. He has no wheezes. He has no  rales.  Musculoskeletal: He exhibits no edema.  Neurological: He is alert and oriented to person, place, and time.  Psychiatric: He has a normal mood and affect. His behavior is normal.       Assessment:     #1 history of CAD  #2 dyslipidemia-goal LDL < 70.    #3 history of gout  #4 ongoing nicotine use    Plan:     -Set up Medicare wellness visit -We discussed one time Medicare abdominal aortic aneurysm screen -Discussed setting up referral to [place] lung cancer screening program -Check hepatitis C antibody -Check other labs with lipid panel, hepatic panel, basic metabolic panel -Discussed shingles vaccine and he will check on coverage first -He is encouraged to discuss with his cardiologist his lightheadedness to see if they are willing to possibly reduce any of his medications.  Eulas Post MD [facility]

## 2017-07-10 NOTE — Patient Instructions (Signed)
Consider new shingles vaccine (Shingrix) and if covered may be able to get at pharmacy. We will set up abdominal aortic aneurysm screening We will set up counseling for low CT lung cancer screen Set up Medicare Wellness exam with Manuela Schwartz.

## 2017-07-11 ENCOUNTER — Telehealth: Payer: Self-pay | Admitting: Family Medicine

## 2017-07-11 LAB — HEPATIC FUNCTION PANEL
ALBUMIN: 4.4 g/dL (ref 3.5–5.2)
ALK PHOS: 65 U/L (ref 39–117)
ALT: 18 U/L (ref 0–53)
AST: 23 U/L (ref 0–37)
BILIRUBIN TOTAL: 0.8 mg/dL (ref 0.2–1.2)
Bilirubin, Direct: 0.1 mg/dL (ref 0.0–0.3)
Total Protein: 6.9 g/dL (ref 6.0–8.3)

## 2017-07-11 LAB — LIPID PANEL
CHOL/HDL RATIO: 2
Cholesterol: 169 mg/dL (ref 0–200)
HDL: 78.6 mg/dL (ref >39.00)
LDL Cholesterol: 74 mg/dL (ref 0–99)
NONHDL: 90.06
Triglycerides: 81 mg/dL (ref 0.0–149.0)
VLDL: 16.2 mg/dL (ref 0.0–40.0)

## 2017-07-11 LAB — BASIC METABOLIC PANEL
BUN: 14 mg/dL (ref 6–23)
CALCIUM: 9.8 mg/dL (ref 8.4–10.5)
CO2: 29 meq/L (ref 19–32)
Chloride: 103 mEq/L (ref 96–112)
Creatinine, Ser: 0.84 mg/dL (ref 0.40–1.50)
GFR: 95.9 mL/min (ref >60.00)
GLUCOSE: 99 mg/dL (ref 70–99)
Potassium: 4.5 mEq/L (ref 3.5–5.1)
SODIUM: 138 meq/L (ref 135–145)

## 2017-07-11 LAB — HEPATITIS C ANTIBODY
Hepatitis C Ab: NONREACTIVE
SIGNAL TO CUT-OFF: 0.01 (ref <1.00)

## 2017-07-11 NOTE — Telephone Encounter (Signed)
Pt called and given results. Documented in result note.  

## 2017-07-11 NOTE — Telephone Encounter (Signed)
Copied from [place]. Topic: Quick Communication - Lab Results >> Jul 11, 2017 10:47 AM Robina Ade, Helene Kelp D wrote: Patient called and was returning call to get his lab results. Triage was busy with other called, told patient they would give him a call back.

## 2017-07-12 ENCOUNTER — Telehealth: Payer: Self-pay | Admitting: *Deleted

## 2017-07-12 NOTE — Telephone Encounter (Signed)
Copied from [place]. Topic: Quick Communication - Lab Results >> Jul 11, 2017  3:05 PM Synthia Innocent wrote: Requesting a copy of his lab results be mailed to him at home address  Copy mailed to home address per patient request

## 2017-08-01 ENCOUNTER — Other Ambulatory Visit: Payer: Self-pay | Admitting: Cardiovascular Disease

## 2017-08-01 ENCOUNTER — Other Ambulatory Visit: Payer: Self-pay | Admitting: Family Medicine

## 2017-08-01 NOTE — Telephone Encounter (Signed)
Rx(s) sent to pharmacy electronically.  

## 2017-09-04 ENCOUNTER — Encounter: Payer: Self-pay | Admitting: Family Medicine

## 2017-09-04 DIAGNOSIS — C44612 Basal cell carcinoma of skin of right upper limb, including shoulder: Secondary | ICD-10-CM | POA: Diagnosis not present

## 2017-09-04 DIAGNOSIS — L821 Other seborrheic keratosis: Secondary | ICD-10-CM | POA: Diagnosis not present

## 2017-09-04 DIAGNOSIS — C44519 Basal cell carcinoma of skin of other part of trunk: Secondary | ICD-10-CM | POA: Diagnosis not present

## 2017-09-04 DIAGNOSIS — L570 Actinic keratosis: Secondary | ICD-10-CM | POA: Diagnosis not present

## 2017-09-04 DIAGNOSIS — D485 Neoplasm of uncertain behavior of skin: Secondary | ICD-10-CM | POA: Diagnosis not present

## 2017-09-04 DIAGNOSIS — D225 Melanocytic nevi of trunk: Secondary | ICD-10-CM | POA: Diagnosis not present

## 2017-09-04 DIAGNOSIS — L84 Corns and callosities: Secondary | ICD-10-CM | POA: Diagnosis not present

## 2017-09-04 DIAGNOSIS — Z85828 Personal history of other malignant neoplasm of skin: Secondary | ICD-10-CM | POA: Diagnosis not present

## 2017-09-05 ENCOUNTER — Ambulatory Visit (INDEPENDENT_AMBULATORY_CARE_PROVIDER_SITE_OTHER): Payer: Medicare Other

## 2017-09-05 VITALS — BP 120/60 | HR 63 | Ht 68.0 in | Wt 146.0 lb

## 2017-09-05 DIAGNOSIS — Z0000 Encounter for general adult medical examination without abnormal findings: Secondary | ICD-10-CM

## 2017-09-05 NOTE — Progress Notes (Addendum)
Subjective:   Jesus Jordan is a 72 y.o. male who presents for Medicare Annual/Subsequent preventive examination.  Lives alone [facility]; one level  Been there 66 years Children are not in GSB   BMI 21.9 Lost 67lb with heart problem but now weight is stable   Reports health as ok Hx of MI - has cardiology apt  Family hx of colon cancer  Diet Cooks and goes out to eat Eats moderate amount of vegetables and fruits   Exercise Plays tennis Belongs to club;  Not often in the winter; 1 day every couple of weeks Plays 3 days a week; 2 hours  May try to join indoor tennis facility during the winter    There are no preventive care reminders to display for this patient.  Colonoscopy 12/2009 an dit is due 12/2019 Father had colon cancer   Educated regarding the shingrix   ETOH stated occasionally; declined to quantify  Current Every smoker Smokes a few (1/3 of a pack), does not smoke other days Was smoking a pack a day and then stopped 4 years 50 years smoking off and on  Educated regarding LDCT; does not want to complete but will review with Dr. [name].   Vas AAA screen ordered by Dr. [name] 06/2017  Requested information and will send out via email [email or web address]   Cardiac Risk Factors include: dyslipidemia;hypertension;male gender;smoking/ tobacco exposure       Objective:    Vitals: BP 120/60   Pulse 63   Ht 5\' 8"  (1.727 m)   Wt 146 lb (66.2 kg)   SpO2 98%   BMI 22.20 kg/m   Body mass index is 22.2 kg/m.  Advanced Directives 09/05/2017  Does Patient Have a Medical Advance Directive? Yes  Type of Paramedic of [place];Living will   Has AD and Dtr is is Health Care agent  Tobacco Social History   Tobacco Use  Smoking Status Current Some Day Smoker  . Years: 30.00  . Types: Cigarettes  Smokeless Tobacco Never Used  Tobacco Comment   skip days some; pack last 3 days      Ready to quit: No Counseling given: Yes Comment:  skip days some; pack last 3 days    Clinical Intake:     Past Medical History:  Diagnosis Date  . CAD 01/29/2009   Qualifier: Diagnosis of  By: [name] MD, [name]    . GOUT 01/29/2009   Qualifier: Diagnosis of  By: [name] MD, [name]    . Gout   . Heart disease    cardiologist Dr Claiborne Billings  . History of MI (myocardial infarction) 09/13/2006   large inferolateral MI secondary to total occlusion of circumflex  . History of nuclear stress test 02/23/2012   exercise myoview; area of scar in inferolateral wall at mid-basal region  . Hyperlipidemia   . INGUINAL HERNIA 05/25/2009   Qualifier: Diagnosis of  By: [name] MD, [name]     Past Surgical History:  Procedure Laterality Date  . CORONARY STENT PLACEMENT  09/12/2008   r/t MI; 3.5x64mm Xience DES to circumflex & PTCA at multiple sites in distal region of vessel; alos diffusely diseaase, narrowed, nondominant RCA  . FOOT SURGERY  1960  . HERNIA REPAIR  2011   left inguinal hernia  . TRANSTHORACIC ECHOCARDIOGRAM  03/02/2009   WC=37-62%; LV systolic function borderline reduced with mild inferior wall hypokinesis; mild-mod MR; mild TR; AV mildly sclerotic; mild aortic root dilatation   Family History  Problem Relation Age of  Onset  . Colon cancer Father   . Hyperlipidemia Brother   . Heart disease Brother 57       massive MI  . Cancer Paternal Grandfather        colon   Social History   Socioeconomic History  . Marital status: Single    Spouse name: Not on file  . Number of children: 3  . Years of education: Not on file  . Highest education level: Not on file  Social Needs  . Financial resource strain: Not on file  . Food insecurity - worry: Not on file  . Food insecurity - inability: Not on file  . Transportation needs - medical: Not on file  . Transportation needs - non-medical: Not on file  Occupational History  . Not on file  Tobacco Use  . Smoking status: Current Some Day Smoker    Years: 30.00    Types: Cigarettes    . Smokeless tobacco: Never Used  . Tobacco comment: skip days some; pack last 3 days   Substance and Sexual Activity  . Alcohol use: Yes    Comment: occasional   . Drug use: No  . Sexual activity: Not on file  Other Topics Concern  . Not on file  Social History Narrative  . Not on file    Outpatient Encounter Medications as of 09/05/2017  Medication Sig  . allopurinol (ZYLOPRIM) 300 MG tablet TAKE 1 TABLET BY MOUTH ONCE DAILY  . aspirin 81 MG tablet Take 81 mg by mouth daily.  . carvedilol (COREG) 12.5 MG tablet TAKE 1 TABLET BY MOUTH TWICE DAILY  . clopidogrel (PLAVIX) 75 MG tablet TAKE ONE TABLET BY MOUTH ONCE DAILY  . colchicine (COLCRYS) 0.6 MG tablet Take 1 tablet (0.6 mg total) by mouth 2 (two) times daily.  Marland Kitchen ezetimibe (ZETIA) 10 MG tablet TAKE 1 TABLET BY MOUTH ONCE DAILY  . fluticasone (CUTIVATE) 0.05 % cream   . isosorbide mononitrate (IMDUR) 30 MG 24 hr tablet TAKE ONE TABLET BY MOUTH ONCE DAILY  . lisinopril (PRINIVIL,ZESTRIL) 10 MG tablet TAKE 1 TABLET BY MOUTH ONCE DAILY  . nitroGLYCERIN (NITROSTAT) 0.4 MG SL tablet DISSOLVE ONE TABLET UNDER THE TONGUE EVERY 5 MINUTES AS NEEDED FOR CHEST PAIN.  DO NOT EXCEED A TOTAL OF 3 DOSES IN 15 MINUTES  . pantoprazole (PROTONIX) 40 MG tablet TAKE 1 TABLET BY MOUTH ONCE DAILY AS NEEDED  . simvastatin (ZOCOR) 40 MG tablet TAKE 1 TABLET BY MOUTH AT BEDTIME   Facility-Administered Encounter Medications as of 09/05/2017  Medication  . zoster vaccine live (PF) (ZOSTAVAX) injection 19,400 Units    Activities of Daily Living In your present state of health, do you have any difficulty performing the following activities: 09/05/2017 07/10/2017  Hearing? N N  Vision? N N  Difficulty concentrating or making decisions? N Y  Walking or climbing stairs? N N  Dressing or bathing? N N  Doing errands, shopping? N N  Preparing Food and eating ? N -  Using the Toilet? N -  In the past six months, have you accidently leaked urine? N -  Do you  have problems with loss of bowel control? N -  Managing your Medications? N -  Managing your Finances? N -  Housekeeping or managing your Housekeeping? N -  Some recent data might be hidden    Patient Care Team: Eulas Post, MD as PCP - General Troy Sine, MD as PCP - Cardiology (Cardiology)   Assessment:  This is a routine wellness examination for [place].  Exercise Activities and Dietary recommendations Current Exercise Habits: Structured exercise class, Type of exercise: Other - see comments(tennis ), Time (Minutes): 60, Frequency (Times/Week): 4, Weekly Exercise (Minutes/Week): 240, Intensity: Moderate  Goals    . Patient Stated     Keep playing tennis; Think  About getting an indoor court for winter        Fall Risk Fall Risk  09/05/2017 03/21/2016 02/17/2016 01/14/2015 01/14/2015  Falls in the past year? Yes No No No No  Comment fell on the tennis court  - Emmi Telephone Survey: data to providers prior to load - -  Number falls in past yr: 1 - - - -  Follow up Education provided - - - -     Depression Screen PHQ 2/9 Scores 03/21/2016 01/14/2015 01/14/2015 06/05/2013  PHQ - 2 Score 0 0 0 0    Cognitive Function MMSE - Mini Mental State Exam 09/05/2017  Not completed: (No Data)   Ad8 score reviewed for issues:  Issues making decisions:  Less interest in hobbies / activities:  Repeats questions, stories (family complaining):  Trouble using ordinary gadgets (microwave, computer, phone):  Forgets the month or year:   Mismanaging finances:   Remembering appts:  Daily problems with thinking and/or memory: Ad8 score is=0 Memory reviewed at recent office visit      Immunization History  Administered Date(s) Administered  . Influenza, High Dose Seasonal PF 04/30/2013, 05/12/2014, 05/12/2015, 03/21/2016, 04/20/2017  . Pneumococcal Conjugate-13 01/14/2015  . Pneumococcal Polysaccharide-23 01/07/2010, 03/21/2016  . Td 06/20/1993, 01/07/2010  . Zoster  02/14/2012    Qualifies for Shingles Vaccine? Educated and will review with pharmacist regarding cost   Screening Tests Health Maintenance  Topic Date Due  . TETANUS/TDAP  01/08/2020  . COLONOSCOPY  05/06/2020  . INFLUENZA VACCINE  Completed  . Hepatitis C Screening  Completed  . PNA vac Low Risk Adult  Completed       Plan:      PCP Notes  Health Maintenance Shingrix reviewed; will fup at his pharmacy  Will schedule an eye exam  Not taking Colchicine unless he has gout; taking allopurinol as rx  Has not had AAA and reviewed the order. Will send him information via EMMI as requested   Abnormal Screens  No Has hearing aids, but did not wear them today   Referrals  no  Patient concerns; As noted   Nurse Concerns; As noted   Next PCP apt  Just seen 07/10/2017 and will schedule next apt     I have personally reviewed and noted the following in the patient's chart:   . Medical and social history . Use of alcohol, tobacco or illicit drugs  . Current medications and supplements . Functional ability and status . Nutritional status . Physical activity . Advanced directives . List of other physicians . Hospitalizations, surgeries, and ER visits in previous 12 months . Vitals . Screenings to include cognitive, depression, and falls . Referrals and appointments  In addition, I have reviewed and discussed with patient certain preventive protocols, quality metrics, and best practice recommendations. A written personalized care plan for preventive services as well as general preventive health recommendations were provided to patient.     SWNIO,EVOJJ, RN  09/05/2017  Above reviewed and agree.  Eulas Post MD  Primary Care at [facility]

## 2017-09-05 NOTE — Patient Instructions (Addendum)
Jesus Jordan , Thank you for taking time to come for your Medicare Wellness Visit. I appreciate your ongoing commitment to your health goals. Please review the following plan we discussed and let me know if I can assist you in the future.   The study for the [place] Preventive Taskforce for AAA can be found: http://www.perez.org/  Shingrix is a vaccine for the prevention of Shingles in Adults 50 and older.  If you are on Medicare, you can request a prescription from your doctor to be filled at a pharmacy.  Please check with your benefits regarding applicable copays or out of pocket expenses.  The Shingrix is given in 2 vaccines approx 8 weeks apart. You must receive the 2nd dose prior to 6 months from receipt of the first.   Will have an eye exam soon  Will send you an email with information regarding the AAA   These are the goals we discussed: Goals    . Patient Stated     Keep playing tennis; Think  About getting an indoor court for winter        This is a list of the screening recommended for you and due dates:  Health Maintenance  Topic Date Due  . Tetanus Vaccine  01/08/2020  . Colon Cancer Screening  05/06/2020  . Flu Shot  Completed  .  Hepatitis C: One time screening is recommended by Center for Disease Control  (CDC) for  adults born from [birth year] through 1965.   Completed  . Pneumonia vaccines  Completed      Fall Prevention in the Home Falls can cause injuries. They can happen to people of all ages. There are many things you can do to make your home safe and to help prevent falls. What can I do on the outside of my home?  Regularly fix the edges of walkways and driveways and fix any cracks.  Remove anything that might make you trip as you walk through a door, such as a raised step or threshold.  Trim any bushes or trees on the path to your home.  Use bright outdoor  lighting.  Clear any walking paths of anything that might make someone trip, such as rocks or tools.  Regularly check to see if handrails are loose or broken. Make sure that both sides of any steps have handrails.  Any raised decks and porches should have guardrails on the edges.  Have any leaves, snow, or ice cleared regularly.  Use sand or salt on walking paths during winter.  Clean up any spills in your garage right away. This includes oil or grease spills. What can I do in the bathroom?  Use night lights.  Install grab bars by the toilet and in the tub and shower. Do not use towel bars as grab bars.  Use non-skid mats or decals in the tub or shower.  If you need to sit down in the shower, use a plastic, non-slip stool.  Keep the floor dry. Clean up any water that spills on the floor as soon as it happens.  Remove soap buildup in the tub or shower regularly.  Attach bath mats securely with double-sided non-slip rug tape.  Do not have throw rugs and other things on the floor that can make you trip. What can I do in the bedroom?  Use night lights.  Make sure that you have a light by your bed that is easy to reach.  Do not use any sheets or blankets that  are too big for your bed. They should not hang down onto the floor.  Have a firm chair that has side arms. You can use this for support while you get dressed.  Do not have throw rugs and other things on the floor that can make you trip. What can I do in the kitchen?  Clean up any spills right away.  Avoid walking on wet floors.  Keep items that you use a lot in easy-to-reach places.  If you need to reach something above you, use a strong step stool that has a grab bar.  Keep electrical cords out of the way.  Do not use floor polish or wax that makes floors slippery. If you must use wax, use non-skid floor wax.  Do not have throw rugs and other things on the floor that can make you trip. What can I do with my  stairs?  Do not leave any items on the stairs.  Make sure that there are handrails on both sides of the stairs and use them. Fix handrails that are broken or loose. Make sure that handrails are as long as the stairways.  Check any carpeting to make sure that it is firmly attached to the stairs. Fix any carpet that is loose or worn.  Avoid having throw rugs at the top or bottom of the stairs. If you do have throw rugs, attach them to the floor with carpet tape.  Make sure that you have a light switch at the top of the stairs and the bottom of the stairs. If you do not have them, ask someone to add them for you. What else can I do to help prevent falls?  Wear shoes that: ? Do not have high heels. ? Have rubber bottoms. ? Are comfortable and fit you well. ? Are closed at the toe. Do not wear sandals.  If you use a stepladder: ? Make sure that it is fully opened. Do not climb a closed stepladder. ? Make sure that both sides of the stepladder are locked into place. ? Ask someone to hold it for you, if possible.  Clearly mark and make sure that you can see: ? Any grab bars or handrails. ? First and last steps. ? Where the edge of each step is.  Use tools that help you move around (mobility aids) if they are needed. These include: ? Canes. ? Walkers. ? Scooters. ? Crutches.  Turn on the lights when you go into a dark area. Replace any light bulbs as soon as they burn out.  Set up your furniture so you have a clear path. Avoid moving your furniture around.  If any of your floors are uneven, fix them.  If there are any pets around you, be aware of where they are.  Review your medicines with your doctor. Some medicines can make you feel dizzy. This can increase your chance of falling. Ask your doctor what other things that you can do to help prevent falls. This information is not intended to replace advice given to you by your health care provider. Make sure you discuss any  questions you have with your health care provider. Document Released: 04/02/2009 Document Revised: 11/12/2015 Document Reviewed: 07/11/2014 Elsevier Interactive Patient Education  2018 [place] Eye Maintenance, Male A healthy lifestyle and preventive care is important for your health and wellness. Ask your health care provider about what schedule of regular examinations is right for you. What should I know about weight and  diet? Eat a Healthy Diet  Eat plenty of vegetables, fruits, whole grains, low-fat dairy products, and lean protein.  Do not eat a lot of foods high in solid fats, added sugars, or salt.  Maintain a Healthy Weight Regular exercise can help you achieve or maintain a healthy weight. You should:  Do at least 150 minutes of exercise each week. The exercise should increase your heart rate and make you sweat (moderate-intensity exercise).  Do strength-training exercises at least twice a week.  Watch Your Levels of Cholesterol and Blood Lipids  Have your blood tested for lipids and cholesterol every 5 years starting at 71 years of age. If you are at high risk for heart disease, you should start having your blood tested when you are 71 years old. You may need to have your cholesterol levels checked more often if: ? Your lipid or cholesterol levels are high. ? You are older than 71 years of age. ? You are at high risk for heart disease.  What should I know about cancer screening? Many types of cancers can be detected early and may often be prevented. Lung Cancer  You should be screened every year for lung cancer if: ? You are a current smoker who has smoked for at least 30 years. ? You are a former smoker who has quit within the past 15 years.  Talk to your health care provider about your screening options, when you should start screening, and how often you should be screened.  Colorectal Cancer  Routine colorectal cancer screening usually begins at 71  years of age and should be repeated every 5-10 years until you are 71 years old. You may need to be screened more often if early forms of precancerous polyps or small growths are found. Your health care provider may recommend screening at an earlier age if you have risk factors for colon cancer.  Your health care provider may recommend using home test kits to check for hidden blood in the stool.  A small camera at the end of a tube can be used to examine your colon (sigmoidoscopy or colonoscopy). This checks for the earliest forms of colorectal cancer.  Prostate and Testicular Cancer  Depending on your age and overall health, your health care provider may do certain tests to screen for prostate and testicular cancer.  Talk to your health care provider about any symptoms or concerns you have about testicular or prostate cancer.  Skin Cancer  Check your skin from head to toe regularly.  Tell your health care provider about any new moles or changes in moles, especially if: ? There is a change in a mole's size, shape, or color. ? You have a mole that is larger than a pencil eraser.  Always use sunscreen. Apply sunscreen liberally and repeat throughout the day.  Protect yourself by wearing long sleeves, pants, a wide-brimmed hat, and sunglasses when outside.  What should I know about heart disease, diabetes, and high blood pressure?  If you are 79-61 years of age, have your blood pressure checked every 3-5 years. If you are 22 years of age or older, have your blood pressure checked every year. You should have your blood pressure measured twice-once when you are at a hospital or clinic, and once when you are not at a hospital or clinic. Record the average of the two measurements. To check your blood pressure when you are not at a hospital or clinic, you can use: ? An automated blood pressure machine  at a pharmacy. ? A home blood pressure monitor.  Talk to your health care provider about your  target blood pressure.  If you are between 75-97 years old, ask your health care provider if you should take aspirin to prevent heart disease.  Have regular diabetes screenings by checking your fasting blood sugar level. ? If you are at a normal weight and have a low risk for diabetes, have this test once every three years after the age of 53. ? If you are overweight and have a high risk for diabetes, consider being tested at a younger age or more often.  A one-time screening for abdominal aortic aneurysm (AAA) by ultrasound is recommended for men aged 75-75 years who are current or former smokers. What should I know about preventing infection? Hepatitis B If you have a higher risk for hepatitis B, you should be screened for this virus. Talk with your health care provider to find out if you are at risk for hepatitis B infection. Hepatitis C Blood testing is recommended for:  Everyone born from [birth year] through 1964.  Anyone with known risk factors for hepatitis C.  Sexually Transmitted Diseases (STDs)  You should be screened each year for STDs including gonorrhea and chlamydia if: ? You are sexually active and are younger than 71 years of age. ? You are older than 71 years of age and your health care provider tells you that you are at risk for this type of infection. ? Your sexual activity has changed since you were last screened and you are at an increased risk for chlamydia or gonorrhea. Ask your health care provider if you are at risk.  Talk with your health care provider about whether you are at high risk of being infected with HIV. Your health care provider may recommend a prescription medicine to help prevent HIV infection.  What else can I do?  Schedule regular health, dental, and eye exams.  Stay current with your vaccines (immunizations).  Do not use any tobacco products, such as cigarettes, chewing tobacco, and e-cigarettes. If you need help quitting, ask your health care  provider.  Limit alcohol intake to no more than 2 drinks per day. One drink equals 12 ounces of beer, 5 ounces of wine, or 1 ounces of hard liquor.  Do not use street drugs.  Do not share needles.  Ask your health care provider for help if you need support or information about quitting drugs.  Tell your health care provider if you often feel depressed.  Tell your health care provider if you have ever been abused or do not feel safe at home. This information is not intended to replace advice given to you by your health care provider. Make sure you discuss any questions you have with your health care provider. Document Released: 12/03/2007 Document Revised: 02/03/2016 Document Reviewed: 03/10/2015 Elsevier Interactive Patient Education  [employer].

## 2017-09-11 ENCOUNTER — Ambulatory Visit (HOSPITAL_COMMUNITY)
Admission: RE | Admit: 2017-09-11 | Discharge: 2017-09-11 | Disposition: A | Discharge status: Home / Self Care | Payer: Medicare Other | Source: Ambulatory Visit | Attending: Cardiovascular Disease | Admitting: Cardiovascular Disease

## 2017-09-11 ENCOUNTER — Ambulatory Visit (INDEPENDENT_AMBULATORY_CARE_PROVIDER_SITE_OTHER): Payer: Medicare Other | Admitting: Cardiovascular Disease

## 2017-09-11 ENCOUNTER — Encounter: Payer: Self-pay | Admitting: Cardiovascular Disease

## 2017-09-11 VITALS — BP 96/59 | HR 60 | Ht 68.0 in | Wt 148.6 lb

## 2017-09-11 DIAGNOSIS — E785 Hyperlipidemia, unspecified: Secondary | ICD-10-CM | POA: Diagnosis not present

## 2017-09-11 DIAGNOSIS — Z136 Encounter for screening for cardiovascular disorders: Secondary | ICD-10-CM | POA: Insufficient documentation

## 2017-09-11 DIAGNOSIS — I2510 Atherosclerotic heart disease of native coronary artery without angina pectoris: Secondary | ICD-10-CM | POA: Diagnosis not present

## 2017-09-11 DIAGNOSIS — I951 Orthostatic hypotension: Secondary | ICD-10-CM | POA: Diagnosis not present

## 2017-09-11 DIAGNOSIS — I708 Atherosclerosis of other arteries: Secondary | ICD-10-CM | POA: Diagnosis not present

## 2017-09-11 MED ORDER — LISINOPRIL 5 MG PO TABS: 5.0000 mg | ORAL_TABLET | Freq: Every day | ORAL | 3 refills | Status: DC

## 2017-09-11 NOTE — Progress Notes (Signed)
Patient ID: Jesus Jordan, male   DOB: 08-06-1945, 72 y.o.   MRN: [id number]    PCP: Dr. [name] HPI: Jesus Jordan is a 72 y.o. male  who presents to the office today for a 45 month cardiology evaluation.  Jesus Jordan suffered a large inferolateral myocardial infarction on 09/12/2008 and presented to [facility] with recurrent episodes of ventricular fibrillation and required numerous defibrillator shocks along with CPR.  I was able to successfully perform intervention to a totally occluded proximal left circumflex coronary artery. This was stented and more distally he had diffuse disease with joint and underwent PTCA at multiple sites in the very distal aspect of this dominant circumflex system. His RCA  was diffusely diseased with stenoses of 90% and was nondominant. His LAD was normal. He had undergone an initial stress test in one year showed an area of scar and inferolateral wall at the mid to basal region. He subsequently was taken off his Effient and then switched to Plavix. A nuclear perfusion study in September 2012 was unchanged from previously and showed the small moderate region of the mid to basal inferior inferolateral scar without associated ischemia.  On 03/04/2014 a three-year followup nuclear perfusion study continued to be low risk and only showed a small fixed inferior defect without reversible ischemia.  He had normal wall motion.  Ejection fraction was 64%.  He is retired.  He is developed a right inguinal hernia and is in need for repair.  He had previously undergone repair of a left inguinal hernia by Dr. Lucia Gaskins and since I last saw him, he underwent successful right hernia surgery.he continues to be active.  Due tot.  Recently, he has  He denies recurrent chest pain.    He denies syncope.  He presents for reevaluation  Past Medical History:  Diagnosis Date  . CAD 01/29/2009   Qualifier: Diagnosis of  By: [name] MD, [name]    . GOUT 01/29/2009   Qualifier: Diagnosis of  By:  [name] MD, [name]    . Gout   . Heart disease    cardiologist Dr Claiborne Billings  . History of MI (myocardial infarction) 09/13/2006   large inferolateral MI secondary to total occlusion of circumflex  . History of nuclear stress test 02/23/2012   exercise myoview; area of scar in inferolateral wall at mid-basal region  . Hyperlipidemia   . INGUINAL HERNIA 05/25/2009   Qualifier: Diagnosis of  By: [name] MD, [name]      Past Surgical History:  Procedure Laterality Date  . CORONARY STENT PLACEMENT  09/12/2008   r/t MI; 3.5x28m Xience DES to circumflex & PTCA at multiple sites in distal region of vessel; alos diffusely diseaase, narrowed, nondominant RCA  . FOOT SURGERY  1960  . HERNIA REPAIR  2011   left inguinal hernia  . TRANSTHORACIC ECHOCARDIOGRAM  03/02/2009   EQI=29-79% LV systolic function borderline reduced with mild inferior wall hypokinesis; mild-mod MR; mild TR; AV mildly sclerotic; mild aortic root dilatation    No Known Allergies  Current Outpatient Medications  Medication Sig Dispense Refill  . allopurinol (ZYLOPRIM) 300 MG tablet TAKE 1 TABLET BY MOUTH ONCE DAILY 90 tablet 1  . aspirin 81 MG tablet Take 81 mg by mouth daily.    . carvedilol (COREG) 12.5 MG tablet TAKE 1 TABLET BY MOUTH TWICE DAILY 180 tablet 0  . clopidogrel (PLAVIX) 75 MG tablet TAKE ONE TABLET BY MOUTH ONCE DAILY 90 tablet 0  . colchicine (COLCRYS) 0.6 MG tablet Take 1  tablet (0.6 mg total) by mouth 2 (two) times daily. 180 tablet 1  . ezetimibe (ZETIA) 10 MG tablet TAKE 1 TABLET BY MOUTH ONCE DAILY 90 tablet 0  . fluticasone (CUTIVATE) 0.05 % cream     . isosorbide mononitrate (IMDUR) 30 MG 24 hr tablet TAKE ONE TABLET BY MOUTH ONCE DAILY 90 tablet 0  . lisinopril (PRINIVIL,ZESTRIL) 5 MG tablet Take 1 tablet (5 mg total) by mouth daily. 90 tablet 3  . nitroGLYCERIN (NITROSTAT) 0.4 MG SL tablet DISSOLVE ONE TABLET UNDER THE TONGUE EVERY 5 MINUTES AS NEEDED FOR CHEST PAIN.  DO NOT EXCEED A TOTAL OF 3 DOSES IN  15 MINUTES 25 tablet 0  . pantoprazole (PROTONIX) 40 MG tablet TAKE 1 TABLET BY MOUTH ONCE DAILY AS NEEDED 30 tablet 0  . simvastatin (ZOCOR) 40 MG tablet TAKE 1 TABLET BY MOUTH AT BEDTIME 90 tablet 0   Current Facility-Administered Medications  Medication Dose Route Frequency Provider Last Rate Last Dose  . zoster vaccine live (PF) (ZOSTAVAX) injection 19,400 Units  0.65 mL Subcutaneous Once Eulas Post, MD        Social History   Socioeconomic History  . Marital status: Single    Spouse name: Not on file  . Number of children: 3  . Years of education: Not on file  . Highest education level: Not on file  Occupational History  . Not on file  Social Needs  . Financial resource strain: Not on file  . Food insecurity:    Worry: Not on file    Inability: Not on file  . Transportation needs:    Medical: Not on file    Non-medical: Not on file  Tobacco Use  . Smoking status: Current Some Day Smoker    Years: 30.00    Types: Cigarettes  . Smokeless tobacco: Never Used  . Tobacco comment: skip days some; pack last 3 days   Substance and Sexual Activity  . Alcohol use: Yes    Comment: occasional   . Drug use: No  . Sexual activity: Not on file  Lifestyle  . Physical activity:    Days per week: Not on file    Minutes per session: Not on file  . Stress: Not on file  Relationships  . Social connections:    Talks on phone: Not on file    Gets together: Not on file    Attends religious service: Not on file    Active member of club or organization: Not on file    Attends meetings of clubs or organizations: Not on file    Relationship status: Not on file  . Intimate partner violence:    Fear of current or ex partner: Not on file    Emotionally abused: Not on file    Physically abused: Not on file    Forced sexual activity: Not on file  Other Topics Concern  . Not on file  Social History Narrative  . Not on file   Social history is notable that he is divorced.  His  daughter is now working in [employer].  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  There is no present tobacco use.  Family History  Problem Relation Age of Onset  . Colon cancer Father   . Hyperlipidemia Brother   . Heart disease Brother 37       massive MI  . Cancer Paternal Grandfather        colon   Socially he  is divorced. Has 3 children and 2 grandchildren. No tobacco use. He does drink occasional alcohol. He does play tennis.  His daughter, [name] is working at [employer] in [employer] in [employer].  ROS General: Negative; No fevers, chills, or night sweats;  HEENT: Negative; No changes in vision or hearing, sinus congestion, difficulty swallowing Pulmonary: Negative; No cough, wheezing, shortness of breath, hemoptysis Cardiovascular: Negative; No chest pain, presyncope, syncope, palpitations GI: Negative; No nausea, vomiting, diarrhea, or abdominal pain GU: Positive for  Bilateral inguinal hernia surgeries;  No dysuria, hematuria, or difficulty voiding Musculoskeletal: Negative; no myalgias, joint pain, or weakness He has a history of gout, but denies any recurrence. Hematologic/Oncology: Negative; no easy bruising, bleeding Endocrine: Negative; no heat/cold intolerance; no diabetes Neuro: Negative; no changes in balance, headaches Skin: Negative; No rashes or skin lesions Psychiatric: Negative; No behavioral problems, depression Sleep: Negative; No snoring, daytime sleepiness, hypersomnolence, bruxism, restless legs, hypnogognic hallucinations, no cataplexy Other comprehensive 14 point system review is negative.  PE BP (!) 96/59   Pulse 60   Ht '5\' 8"'  (1.727 m)   Wt 148 lb 9.6 oz (67.4 kg)   BMI 22.59 kg/m    Blood pressure by me was 112/62, supine and 94/60 standing  Wt Readings from Last 3 Encounters:  09/11/17 148 lb 9.6 oz (67.4 kg)  09/05/17 146 lb (66.2 kg)  07/10/17 147 lb 11.2 oz (67 kg)   General: Alert, oriented, no  distress.  Skin: normal turgor, no rashes, warm and dry HEENT: Normocephalic, atraumatic. Pupils equal round and reactive to light; sclera anicteric; extraocular muscles intact;  Nose without nasal septal hypertrophy Mouth/Parynx benign; Mallinpatti scale 3 Neck: No JVD, no carotid bruits; normal carotid upstroke Lungs: clear to ausculatation and percussion; no wheezing or rales Chest wall: without tenderness to palpitation Heart: PMI not displaced, RRR, s1 s2 normal, 1/6 systolic murmur, no diastolic murmur, no rubs, gallops, thrills, or heaves Abdomen: soft, nontender; no hepatosplenomehaly, BS+; abdominal aorta nontender and not dilated by palpation. Back: no CVA tenderness Pulses 2+ Musculoskeletal: full range of motion, normal strength, no joint deformities Extremities: no clubbing cyanosis or edema, Homan's sign negative  Neurologic: grossly nonfocal; Cranial nerves grossly wnl Psychologic: Normal mood and affect   ECG (independently read by me): sinus bradycardia 58 bpm.  Borderline first-degree AV blo  No ST segment abnormalities.  November 2017 ECG (independently read by me): Normal sinus rhythm at 61 bpm.  Borderline first-degree AV block with a PR interval of 210 ms.  QTc interval normal at 390 ms.  November 2016 ECG (independently read by me): Normal sinus rhythm at 61 bpm.  No ectopy.  Normal intervals.  No ECG evidence of prior myocardial infarction.  September 2015 ECG (independently read by me): Sinus bradycardia 57 beats per minute.  Borderline first-degree AV block with PR interval of 204 ms.  Prior ECG: Sinus rhythm at 63 beats per minute. Normal intervals.  LABS:  BMP Latest Ref Rng & Units 07/10/2017 03/21/2016 01/14/2015  Glucose 70 - 99 mg/dL 99 91 88  BUN 6 - 23 mg/dL '14 10 13  ' Creatinine 0.40 - 1.50 mg/dL 0.84 0.81 0.94  Sodium 135 - 145 mEq/L 138 139 140  Potassium 3.5 - 5.1 mEq/L 4.5 4.3 4.7  Chloride 96 - 112 mEq/L 103 104 103  CO2 19 - 32 mEq/L '29 27 30    ' Calcium 8.4 - 10.5 mg/dL 9.8 9.4 9.7   Hepatic Function Latest Ref Rng & Units 07/10/2017 03/21/2016 01/14/2015  Total Protein 6.0 - 8.3 g/dL 6.9 7.3 7.2  Albumin 3.5 - 5.2 g/dL 4.4 4.1 4.3  AST 0 - 37 U/L '23 27 26  ' ALT 0 - 53 U/L '18 20 22  ' Alk Phosphatase 39 - 117 U/L 65 73 87  Total Bilirubin 0.2 - 1.2 mg/dL 0.8 0.8 0.9  Bilirubin, Direct 0.0 - 0.3 mg/dL 0.1 0.2 0.2   CBC Latest Ref Rng & Units 03/21/2016 03/18/2014 03/06/2013  WBC 4.0 - 10.5 K/uL 5.7 6.2 7.0  Hemoglobin 13.0 - 17.0 g/dL 14.9 14.9 15.4  Hematocrit 39.0 - 52.0 % 43.5 44.3 44.9  Platelets 150.0 - 400.0 K/uL 157.0 155 193   Lab Results  Component Value Date   MCV 98.6 03/21/2016   MCV 98.0 03/18/2014   MCV 95.5 03/06/2013   Lab Results  Component Value Date   TSH 0.68 03/21/2016   Lipid Panel     Component Value Date/Time   CHOL 169 07/10/2017 1545   CHOL 142 03/06/2013 1039   TRIG 81.0 07/10/2017 1545   TRIG 74 03/06/2013 1039   HDL 78.60 07/10/2017 1545   HDL 73 03/06/2013 1039   CHOLHDL 2 07/10/2017 1545   VLDL 16.2 07/10/2017 1545   LDLCALC 74 07/10/2017 1545   LDLCALC 54 03/06/2013 1039   RADIOLOGY: No results found.   IMPRESSION: 1. CAD in native artery   2. Orthostatic hypotension   3. Hyperlipidemia with target LDL less than 70      ASSESSMENT AND PLAN: Jesus Jordan is a 72 year old white male who is status post a VF cardiac arrest secondary to total proximal occlusion of his of a dominant left circumflex artery artery on 09/12/2008.  He has been demonstrated to have significant myocardial salvage in the lateral wall but does have mid to basal inferior to inferolateral scar. His last nuclear perfusion study in 2015  did not reveal any wall motion abnormality and his ejection fraction was 64% without evidence for ischemia.  Since I last saw him, he tolerated his inguinal hernia surgery without cardiovascular compromise. Marland Kitchen  He is orthostatic on blood pressure evaluation today.   He has noticed  episodes of dizziness with standing.   I am recommending reduction of lisinopril from 10 mg to 5 mg and id he continue to experience dizziness isosorbide will then be reduced to 15 mg. .  He continues to be on simvastatin 40 mg and Zetia 10 mg for hyperlipidemia. .  I reviewed recent laboratory from January 2019; LDL was 74 which had increased from 53. Marland Kitchen  He continues to be on DAPT with aspirin/Plavix; there is no bleeding or easy bruisability. He plans to resume playing more tennis as the weather improves.  He has a history of gout but has not had recent episodes on colchicine. Repeat laboratory will be obtained in July and I will see in in August for f/u evaluation.   Time spent: 25 minutes  Troy Sine, MD, [facility]  09/11/2017 6:20 PM

## 2017-09-11 NOTE — Patient Instructions (Signed)
Medication Instructions:  DECREASE lisinopril to 5 mg daily --after 1 week if dizziness still remains, decrease isosorbide (Imdur) to 15 mg (1/2 tablet) daily  Follow-Up: Your physician wants you to follow-up in: August with Dr. Claiborne Billings. You will receive a reminder letter in the mail two months in advance. If you don't receive a letter, please call our office to schedule the follow-up appointment.  Any Other Special Instructions Will Be Listed Below (If Applicable).     If you need a refill on your cardiac medications before your next appointment, please call your pharmacy.

## 2017-10-02 ENCOUNTER — Other Ambulatory Visit: Payer: Self-pay | Admitting: Cardiovascular Disease

## 2017-11-18 ENCOUNTER — Other Ambulatory Visit: Payer: Self-pay | Admitting: Cardiovascular Disease

## 2017-11-20 NOTE — Telephone Encounter (Signed)
Rx sent to pharmacy   

## 2017-12-27 ENCOUNTER — Other Ambulatory Visit: Payer: Self-pay | Admitting: Cardiovascular Disease

## 2018-02-10 ENCOUNTER — Other Ambulatory Visit: Payer: Self-pay | Admitting: Family Medicine

## 2018-02-10 ENCOUNTER — Other Ambulatory Visit: Payer: Self-pay | Admitting: Cardiovascular Disease

## 2018-02-12 ENCOUNTER — Other Ambulatory Visit: Payer: Self-pay | Admitting: Family Medicine

## 2018-03-07 ENCOUNTER — Other Ambulatory Visit: Payer: Self-pay | Admitting: *Deleted

## 2018-03-07 DIAGNOSIS — D1801 Hemangioma of skin and subcutaneous tissue: Secondary | ICD-10-CM | POA: Diagnosis not present

## 2018-03-07 DIAGNOSIS — Z85828 Personal history of other malignant neoplasm of skin: Secondary | ICD-10-CM | POA: Diagnosis not present

## 2018-03-07 DIAGNOSIS — D692 Other nonthrombocytopenic purpura: Secondary | ICD-10-CM | POA: Diagnosis not present

## 2018-03-07 DIAGNOSIS — L570 Actinic keratosis: Secondary | ICD-10-CM | POA: Diagnosis not present

## 2018-03-07 MED ORDER — LISINOPRIL 5 MG PO TABS: 5.0000 mg | ORAL_TABLET | Freq: Every day | ORAL | 1 refills | Status: DC

## 2018-03-13 DIAGNOSIS — Z23 Encounter for immunization: Secondary | ICD-10-CM | POA: Diagnosis not present

## 2018-04-18 ENCOUNTER — Other Ambulatory Visit: Payer: Self-pay

## 2018-04-18 ENCOUNTER — Ambulatory Visit (INDEPENDENT_AMBULATORY_CARE_PROVIDER_SITE_OTHER): Payer: Medicare Other | Admitting: Family Medicine

## 2018-04-18 ENCOUNTER — Encounter: Payer: Self-pay | Admitting: Family Medicine

## 2018-04-18 VITALS — BP 98/58 | HR 67 | Temp 98.3°F | Ht 68.0 in | Wt 142.5 lb

## 2018-04-18 DIAGNOSIS — F329 Major depressive disorder, single episode, unspecified: Secondary | ICD-10-CM | POA: Diagnosis not present

## 2018-04-18 DIAGNOSIS — E785 Hyperlipidemia, unspecified: Secondary | ICD-10-CM

## 2018-04-18 DIAGNOSIS — R634 Abnormal weight loss: Secondary | ICD-10-CM | POA: Diagnosis not present

## 2018-04-18 DIAGNOSIS — I2510 Atherosclerotic heart disease of native coronary artery without angina pectoris: Secondary | ICD-10-CM

## 2018-04-18 DIAGNOSIS — K219 Gastro-esophageal reflux disease without esophagitis: Secondary | ICD-10-CM | POA: Insufficient documentation

## 2018-04-18 DIAGNOSIS — M1A9XX0 Chronic gout, unspecified, without tophus (tophi): Secondary | ICD-10-CM

## 2018-04-18 DIAGNOSIS — R4589 Other symptoms and signs involving emotional state: Secondary | ICD-10-CM

## 2018-04-18 HISTORY — DX: Gastro-esophageal reflux disease without esophagitis: K21.9

## 2018-04-18 MED ORDER — MIRTAZAPINE 15 MG PO TBDP: 15.0000 mg | ORAL_TABLET | Freq: Every day | ORAL | 2 refills | Status: DC
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
## 2018-04-18 NOTE — Patient Instructions (Signed)
Take the Mirtazepine about 30 minutes prior to bedtime

## 2018-04-20 ENCOUNTER — Encounter: Payer: Self-pay | Admitting: Cardiovascular Disease

## 2018-04-20 ENCOUNTER — Ambulatory Visit (INDEPENDENT_AMBULATORY_CARE_PROVIDER_SITE_OTHER): Payer: Medicare Other | Admitting: Cardiovascular Disease

## 2018-04-20 VITALS — BP 114/69 | HR 57 | Ht 68.5 in | Wt 142.0 lb

## 2018-04-20 DIAGNOSIS — R42 Dizziness and giddiness: Secondary | ICD-10-CM | POA: Diagnosis not present

## 2018-04-20 DIAGNOSIS — F32A Depression, unspecified: Secondary | ICD-10-CM

## 2018-04-20 DIAGNOSIS — F329 Major depressive disorder, single episode, unspecified: Secondary | ICD-10-CM

## 2018-04-20 DIAGNOSIS — E785 Hyperlipidemia, unspecified: Secondary | ICD-10-CM | POA: Diagnosis not present

## 2018-04-20 DIAGNOSIS — I2510 Atherosclerotic heart disease of native coronary artery without angina pectoris: Secondary | ICD-10-CM

## 2018-04-20 MED ORDER — CARVEDILOL 12.5 MG PO TABS: ORAL_TABLET | ORAL | 3 refills | Status: DC

## 2018-04-20 NOTE — Patient Instructions (Signed)
Medication Instructions:  Decrease carvedilol (Coreg) to 12.5 mg in the AM and 6.25 mg in the PM  If you need a refill on your cardiac medications before your next appointment, please call your pharmacy.   Follow-Up: At [facility], you and your health needs are our priority.  As part of our continuing mission to provide you with exceptional heart care, we have created designated Provider Care Teams.  These Care Teams include your primary Cardiologist (physician) and Advanced Practice Providers (APPs -  Physician Assistants and Nurse Practitioners) who all work together to provide you with the care you need, when you need it. You will need a follow up appointment in 6 months.  Please call our office 2 months in advance to schedule this appointment.  You may see Shelva Majestic, MD or one of the following Advanced Practice Providers on your designated Care Team: [place], [place] . Fabian Sharp, PA-C

## 2018-04-20 NOTE — Addendum Note (Signed)
Addended by: Leland Johns A on: 04/20/2018 03:00 PM   Modules accepted: Orders

## 2018-04-20 NOTE — Progress Notes (Signed)
Patient ID: Jesus Jordan, male   DOB: 10/11/1945, 72 y.o.   MRN: [id number]    PCP: Dr. [name]   HPI: Jesus Jordan is a 72 y.o. male  who presents to the office today for a 7 month cardiology evaluation.  [name] suffered a large inferolateral myocardial infarction on 09/12/2008 and presented to [facility] with recurrent episodes of ventricular fibrillation and required numerous defibrillator shocks along with CPR.  I was able to successfully perform intervention to a totally occluded proximal left circumflex coronary artery. This was stented and more distally he had diffuse disease with joint and underwent PTCA at multiple sites in the very distal aspect of this dominant circumflex system. His RCA  was diffusely diseased with stenoses of 90% and was nondominant. His LAD was normal. He had undergone an initial stress test in one year showed an area of scar and inferolateral wall at the mid to basal region. He subsequently was taken off his Effient and then switched to Plavix. A nuclear perfusion study in September 2012 was unchanged from previously and showed the small moderate region of the mid to basal inferior inferolateral scar without associated ischemia.  On 03/04/2014 a three-year followup nuclear perfusion study continued to be low risk and only showed a small fixed inferior defect without reversible ischemia.  He had normal wall motion.  Ejection fraction was 64%.  I last saw him in March 2019 at which time he had developed a right inguinal hernia and was in need for surgical repair.  He had previously undergone successful repair of a left inguinal hernia by Dr. Lucia Gaskins.  When I saw him, he was stable without anginal symptomatology or palpitations but had noticed some mild episodes of dizziness.  I recommended he reduce lisinopril from 10 mg to 5 mg.  He underwent right inguinal hernia surgery successfully without cardiovascular compromise.  His dizziness has improved but at times he still  notes rare episodes.  Of note, since suffering his myocardial infarction in March 2010 he has lost 75 pounds.  He was recently seen by Dr. [name] for primary care evaluation and will be seeing him back in December at which time laboratories will be obtained.  He presents for follow-up cardiology evaluation.     Past Medical History:  Diagnosis Date  . CAD 01/29/2009   Qualifier: Diagnosis of  By: [name] MD, [name]    . GOUT 01/29/2009   Qualifier: Diagnosis of  By: [name] MD, [name]    . Gout   . Heart disease    cardiologist Dr Claiborne Billings  . History of MI (myocardial infarction) 09/13/2006   large inferolateral MI secondary to total occlusion of circumflex  . History of nuclear stress test 02/23/2012   exercise myoview; area of scar in inferolateral wall at mid-basal region  . Hyperlipidemia   . INGUINAL HERNIA 05/25/2009   Qualifier: Diagnosis of  By: [name] MD, [name]      Past Surgical History:  Procedure Laterality Date  . CORONARY STENT PLACEMENT  09/12/2008   r/t MI; 3.5x1m Xience DES to circumflex & PTCA at multiple sites in distal region of vessel; alos diffusely diseaase, narrowed, nondominant RCA  . FOOT SURGERY  1960  . HERNIA REPAIR  2011   left inguinal hernia  . TRANSTHORACIC ECHOCARDIOGRAM  03/02/2009   ENW=29-56% LV systolic function borderline reduced with mild inferior wall hypokinesis; mild-mod MR; mild TR; AV mildly sclerotic; mild aortic root dilatation    No Known Allergies  Current Outpatient  Medications  Medication Sig Dispense Refill  . allopurinol (ZYLOPRIM) 100 MG tablet TAKE 2 TABLETS BY MOUTH DAILY 180 tablet 0  . allopurinol (ZYLOPRIM) 300 MG tablet TAKE 1 TABLET BY MOUTH ONCE DAILY 90 tablet 1  . aspirin 81 MG tablet Take 81 mg by mouth daily.    . carvedilol (COREG) 12.5 MG tablet Take 1 tablet (12.5 mg total) by mouth every morning AND 0.5 tablets (6.25 mg total) every evening. 135 tablet 3  . clopidogrel (PLAVIX) 75 MG tablet TAKE 1 TABLET BY  MOUTH ONCE DAILY 90 tablet 0  . colchicine 0.6 MG tablet Take 0.6 mg by mouth as needed.    . ezetimibe (ZETIA) 10 MG tablet TAKE 1 TABLET BY MOUTH ONCE DAILY 90 tablet 0  . fluticasone (CUTIVATE) 0.05 % cream     . isosorbide mononitrate (IMDUR) 30 MG 24 hr tablet TAKE ONE TABLET BY MOUTH ONCE DAILY 90 tablet 0  . lisinopril (PRINIVIL,ZESTRIL) 5 MG tablet Take 1 tablet (5 mg total) by mouth daily. 90 tablet 1  . mirtazapine (REMERON SOLTAB) 15 MG disintegrating tablet Take 1 tablet (15 mg total) by mouth at bedtime. 30 tablet 2  . nitroGLYCERIN (NITROSTAT) 0.4 MG SL tablet DISSOLVE ONE TABLET UNDER THE TONGUE EVERY 5 MINUTES AS NEEDED FOR CHEST PAIN.  DO NOT EXCEED A TOTAL OF 3 DOSES IN 15 MINUTES 25 tablet 0  . pantoprazole (PROTONIX) 40 MG tablet TAKE 1 TABLET BY MOUTH ONCE DAILY AS NEEDED 30 tablet 4  . simvastatin (ZOCOR) 40 MG tablet TAKE 1 TABLET BY MOUTH AT BEDTIME 90 tablet 1   Current Facility-Administered Medications  Medication Dose Route Frequency Provider Last Rate Last Dose  . zoster vaccine live (PF) (ZOSTAVAX) injection 19,400 Units  0.65 mL Subcutaneous Once Eulas Post, MD        Social History   Socioeconomic History  . Marital status: Single    Spouse name: Not on file  . Number of children: 3  . Years of education: Not on file  . Highest education level: Not on file  Occupational History  . Not on file  Social Needs  . Financial resource strain: Not on file  . Food insecurity:    Worry: Not on file    Inability: Not on file  . Transportation needs:    Medical: Not on file    Non-medical: Not on file  Tobacco Use  . Smoking status: Current Some Day Smoker    Years: 30.00    Types: Cigarettes  . Smokeless tobacco: Never Used  . Tobacco comment: skip days some; pack last 3 days   Substance and Sexual Activity  . Alcohol use: Yes    Comment: occasional   . Drug use: No  . Sexual activity: Not on file  Lifestyle  . Physical activity:    Days per  week: Not on file    Minutes per session: Not on file  . Stress: Not on file  Relationships  . Social connections:    Talks on phone: Not on file    Gets together: Not on file    Attends religious service: Not on file    Active member of club or organization: Not on file    Attends meetings of clubs or organizations: Not on file    Relationship status: Not on file  . Intimate partner violence:    Fear of current or ex partner: Not on file    Emotionally abused: Not on file  Physically abused: Not on file    Forced sexual activity: Not on file  Other Topics Concern  . Not on file  Social History Narrative  . Not on file   Social history is notable that he is divorced.  His daughter is now working in [employer].  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  There is no present tobacco use.  Family History  Problem Relation Age of Onset  . Colon cancer Father   . Hyperlipidemia Brother   . Heart disease Brother 59       massive MI  . Cancer Paternal Grandfather        colon   Socially he is divorced. Has 3 children and 2 grandchildren. No tobacco use. He does drink occasional alcohol. He does play tennis.  His daughter, [name] is working at [employer] in [employer] in [employer].  ROS General: Negative; No fevers, chills, or night sweats;  HEENT: Negative; No changes in vision or hearing, sinus congestion, difficulty swallowing Pulmonary: Negative; No cough, wheezing, shortness of breath, hemoptysis Cardiovascular: Negative; No chest pain, presyncope, syncope, palpitations GI: Negative; No nausea, vomiting, diarrhea, or abdominal pain GU: Positive for  Bilateral inguinal hernia surgeries;  No dysuria, hematuria, or difficulty voiding Musculoskeletal: Negative; no myalgias, joint pain, or weakness He has a history of gout, but denies any recurrence. Hematologic/Oncology: Negative; no easy bruising, bleeding Endocrine: Negative; no heat/cold  intolerance; no diabetes Neuro: Negative; no changes in balance, headaches Skin: Negative; No rashes or skin lesions Psychiatric: Negative; No behavioral problems, depression Sleep: Negative; No snoring, daytime sleepiness, hypersomnolence, bruxism, restless legs, hypnogognic hallucinations, no cataplexy Other comprehensive 14 point system review is negative.  PE BP 114/69   Pulse (!) 57   Ht 5' 8.5" (1.74 m)   Wt 142 lb (64.4 kg)   BMI 21.28 kg/m    Repeat blood pressure by me was 110/70 supine and this did not change and remained 110/70 with standing  Wt Readings from Last 3 Encounters:  04/20/18 142 lb (64.4 kg)  04/18/18 142 lb 8 oz (64.6 kg)  09/11/17 148 lb 9.6 oz (67.4 kg)   General: Alert, oriented, no distress.  Skin: normal turgor, no rashes, warm and dry HEENT: Normocephalic, atraumatic. Pupils equal round and reactive to light; sclera anicteric; extraocular muscles intact;  Nose without nasal septal hypertrophy Mouth/Parynx benign; Mallinpatti scale 2 Neck: No JVD, no carotid bruits; normal carotid upstroke Lungs: clear to ausculatation and percussion; no wheezing or rales Chest wall: without tenderness to palpitation Heart: PMI not displaced, RRR, s1 s2 normal, 1/6 systolic murmur, no diastolic murmur, no rubs, gallops, thrills, or heaves Abdomen: soft, nontender; no hepatosplenomehaly, BS+; abdominal aorta nontender and not dilated by palpation. Back: no CVA tenderness Pulses 2+ Musculoskeletal: full range of motion, normal strength, no joint deformities Extremities: no clubbing cyanosis or edema, Homan's sign negative  Neurologic: grossly nonfocal; Cranial nerves grossly wnl Psychologic: Normal mood and affect   ECG (independently read by me): Sinus bradycardia 57 bpm.  Borderline first-degree AV block with appeared normal at 206 ms.  March 2019 ECG (independently read by me): sinus bradycardia 58 bpm.  Borderline first-degree AV blo  No ST segment  abnormalities.  November 2017 ECG (independently read by me): Normal sinus rhythm at 61 bpm.  Borderline first-degree AV block with a PR interval of 210 ms.  QTc interval normal at 390 ms.  November 2016 ECG (independently read by me): Normal sinus rhythm at 61 bpm.  No ectopy.  Normal intervals.  No ECG evidence of prior myocardial infarction.  September 2015 ECG (independently read by me): Sinus bradycardia 57 beats per minute.  Borderline first-degree AV block with PR interval of 204 ms.  Prior ECG: Sinus rhythm at 63 beats per minute. Normal intervals.  LABS:  BMP Latest Ref Rng & Units 07/10/2017 03/21/2016 01/14/2015  Glucose 70 - 99 mg/dL 99 91 88  BUN 6 - 23 mg/dL '14 10 13  ' Creatinine 0.40 - 1.50 mg/dL 0.84 0.81 0.94  Sodium 135 - 145 mEq/L 138 139 140  Potassium 3.5 - 5.1 mEq/L 4.5 4.3 4.7  Chloride 96 - 112 mEq/L 103 104 103  CO2 19 - 32 mEq/L '29 27 30  ' Calcium 8.4 - 10.5 mg/dL 9.8 9.4 9.7   Hepatic Function Latest Ref Rng & Units 07/10/2017 03/21/2016 01/14/2015  Total Protein 6.0 - 8.3 g/dL 6.9 7.3 7.2  Albumin 3.5 - 5.2 g/dL 4.4 4.1 4.3  AST 0 - 37 U/L '23 27 26  ' ALT 0 - 53 U/L '18 20 22  ' Alk Phosphatase 39 - 117 U/L 65 73 87  Total Bilirubin 0.2 - 1.2 mg/dL 0.8 0.8 0.9  Bilirubin, Direct 0.0 - 0.3 mg/dL 0.1 0.2 0.2   CBC Latest Ref Rng & Units 03/21/2016 03/18/2014 03/06/2013  WBC 4.0 - 10.5 K/uL 5.7 6.2 7.0  Hemoglobin 13.0 - 17.0 g/dL 14.9 14.9 15.4  Hematocrit 39.0 - 52.0 % 43.5 44.3 44.9  Platelets 150.0 - 400.0 K/uL 157.0 155 193   Lab Results  Component Value Date   MCV 98.6 03/21/2016   MCV 98.0 03/18/2014   MCV 95.5 03/06/2013   Lab Results  Component Value Date   TSH 0.68 03/21/2016   Lipid Panel     Component Value Date/Time   CHOL 169 07/10/2017 1545   CHOL 142 03/06/2013 1039   TRIG 81.0 07/10/2017 1545   TRIG 74 03/06/2013 1039   HDL 78.60 07/10/2017 1545   HDL 73 03/06/2013 1039   CHOLHDL 2 07/10/2017 1545   VLDL 16.2 07/10/2017 1545    LDLCALC 74 07/10/2017 1545   LDLCALC 54 03/06/2013 1039   RADIOLOGY: No results found.   IMPRESSION: 1. CAD in native artery   2. Hyperlipidemia with target LDL less than 70   3. Dizziness   4. Depression, unspecified depression type     ASSESSMENT AND PLAN: Mr. Wickens is a 72 year old white male who suffered a VF cardiac arrest secondary to total proximal occlusion of his of a dominant left circumflex artery artery on 09/12/2008.  He has been demonstrated to have significant myocardial salvage in the lateral wall but does have mid to basal inferior to inferolateral scar. His last nuclear perfusion study in 2015  did not reveal any wall motion abnormality and his ejection fraction was 64% without evidence for ischemia.  I last saw him, he had mild orthostatic symptoms.  I reduced his lisinopril from 10 mg down to 5 mg.  His blood pressure today is improved but remains low 110/70.  He occasionally notes rare dizziness if he stands abruptly.  He is bradycardic with pulse of 57.  I am suggesting he is slightly reduced carvedilol and he will take 12.5 mg in the morning and 6.25 mg at night.  If he continues to note symptoms he can try switching intake to 6.25 mg in the morning and 12.5 mg at night.  If symptoms persist he will then reduce his dose to 6.25 mg twice a  day.  I reviewed laboratory from January 2019.  LDL was 74.  He has continued to be on Zetia 10 mg and simvastatin 40 mg.  Dr. [name] will be rechecking laboratory several months.  If his LDL cholesterol is greater than 70 I would suggest simvastatin be discontinued may be switched to atorvastatin 40 mg or rosuvastatin 20 mg for more aggressive lipid-lowering therapy and continue his current regimen of Zetia.  He remains on antiplatelet therapy.  There is no bleeding.  He was recently giving a prescription for temazepam for depression which she has not yet started.  He is not having anginal symptoms.  I will see him in 6 months for  reevaluation.  Time spent: 25 minutes  Troy Sine, MD, [facility]  04/20/2018 9:59 AM

## 2018-05-03 ENCOUNTER — Other Ambulatory Visit: Payer: Self-pay | Admitting: Cardiovascular Disease

## 2018-05-30 ENCOUNTER — Encounter: Payer: Self-pay | Admitting: Family Medicine

## 2018-05-30 ENCOUNTER — Other Ambulatory Visit: Payer: Self-pay

## 2018-05-30 ENCOUNTER — Ambulatory Visit (INDEPENDENT_AMBULATORY_CARE_PROVIDER_SITE_OTHER): Payer: Medicare Other | Admitting: Family Medicine

## 2018-05-30 VITALS — BP 116/82 | HR 59 | Temp 98.3°F | Ht 68.0 in | Wt 146.9 lb

## 2018-05-30 DIAGNOSIS — E785 Hyperlipidemia, unspecified: Secondary | ICD-10-CM

## 2018-05-30 DIAGNOSIS — R634 Abnormal weight loss: Secondary | ICD-10-CM | POA: Diagnosis not present

## 2018-05-30 DIAGNOSIS — I2510 Atherosclerotic heart disease of native coronary artery without angina pectoris: Secondary | ICD-10-CM

## 2018-05-30 DIAGNOSIS — R42 Dizziness and giddiness: Secondary | ICD-10-CM

## 2018-05-30 NOTE — Progress Notes (Signed)
Subjective:     Patient ID: Jesus Jordan, male   DOB: 21-Nov-1945, 72 y.o.   MRN: [id number]  HPI Patient is seen for  6-week follow-up.  He had some dizziness last visit and cardiologist scaled back some of his meds.  His dizziness has resolved.  We were concerned because of some weight loss.  We had prescribed mirtazapine but he never started this for fear of side effects.  He still drinks some beer at night but has scaled back slightly.  He is drinking low calorie beer.  He states he has eating better.  His weight is up almost 5 pounds from then.  He feels his mood is stable.  Sleeping fairly well.  No chest pains.  Hx of hyperipidemia/CAD- on statin.  No chest pain,  No myalgias.  Will need follow up labs soon.  Past Medical History:  Diagnosis Date  . CAD 01/29/2009   Qualifier: Diagnosis of  By: [name] MD, Lexie Morini    . GOUT 01/29/2009   Qualifier: Diagnosis of  By: [name] MD, Kyrsten Deleeuw    . Gout   . Heart disease    cardiologist Dr Claiborne Billings  . History of MI (myocardial infarction) 09/13/2006   large inferolateral MI secondary to total occlusion of circumflex  . History of nuclear stress test 02/23/2012   exercise myoview; area of scar in inferolateral wall at mid-basal region  . Hyperlipidemia   . INGUINAL HERNIA 05/25/2009   Qualifier: Diagnosis of  By: [name] MD, Myelle Poteat     Past Surgical History:  Procedure Laterality Date  . CORONARY STENT PLACEMENT  09/12/2008   r/t MI; 3.5x48mm Xience DES to circumflex & PTCA at multiple sites in distal region of vessel; alos diffusely diseaase, narrowed, nondominant RCA  . FOOT SURGERY  1960  . HERNIA REPAIR  2011   left inguinal hernia  . TRANSTHORACIC ECHOCARDIOGRAM  03/02/2009   DP=82-42%; LV systolic function borderline reduced with mild inferior wall hypokinesis; mild-mod MR; mild TR; AV mildly sclerotic; mild aortic root dilatation    reports that he has been smoking cigarettes. He has smoked for the past 30.00 years. He has never used  smokeless tobacco. He reports that he drinks alcohol. He reports that he does not use drugs. family history includes Cancer in his paternal grandfather; Colon cancer in his father; Heart disease (age of onset: 47) in his brother; Hyperlipidemia in his brother. No Known Allergies   Review of Systems  Constitutional: Negative for fatigue and unexpected weight change.  Eyes: Negative for visual disturbance.  Respiratory: Negative for cough, chest tightness and shortness of breath.   Cardiovascular: Negative for chest pain, palpitations and leg swelling.  Endocrine: Negative for polydipsia and polyuria.  Neurological: Negative for dizziness, syncope, weakness, light-headedness and headaches.  Psychiatric/Behavioral: Negative for dysphoric mood.       Objective:   Physical Exam  Constitutional: He is oriented to person, place, and time. He appears well-developed and well-nourished.  HENT:  Right Ear: External ear normal.  Left Ear: External ear normal.  Mouth/Throat: Oropharynx is clear and moist.  Eyes: Pupils are equal, round, and reactive to light.  Neck: Neck supple. No thyromegaly present.  Cardiovascular: Normal rate and regular rhythm.  Pulmonary/Chest: Effort normal and breath sounds normal. No respiratory distress. He has no wheezes. He has no rales.  Musculoskeletal: He exhibits no edema.  Neurological: He is alert and oriented to person, place, and time.       Assessment:     #1  weight loss improved with about 5 pound weight gain since last visit  #2 dizziness improved since scaling back medications  #3 hx of CAD/hyperlipidemia    Plan:     -We have again advised he try to scale back his alcohol use.  Continue nutritional supplement with boost as needed. -He at this point has elected not to start the mirtazapine and we do not see any clear reason to start this since his sleep is good he feels his mood is stable and appetite improving -Routine follow-up in 6 months  and sooner as needed -needs follow up fasting labs soon for lipids and chemistries.  Eulas Post MD [facility]

## 2018-07-02 ENCOUNTER — Other Ambulatory Visit: Payer: Self-pay | Admitting: Cardiovascular Disease

## 2018-08-04 ENCOUNTER — Other Ambulatory Visit: Payer: Self-pay | Admitting: Cardiovascular Disease

## 2018-08-16 ENCOUNTER — Other Ambulatory Visit: Payer: Self-pay | Admitting: Cardiovascular Disease

## 2018-08-16 ENCOUNTER — Other Ambulatory Visit: Payer: Self-pay | Admitting: Family Medicine

## 2018-08-17 NOTE — Telephone Encounter (Signed)
Rx(s) sent to pharmacy electronically.  

## 2018-09-07 ENCOUNTER — Ambulatory Visit: Payer: Medicare Other

## 2018-10-15 DIAGNOSIS — D692 Other nonthrombocytopenic purpura: Secondary | ICD-10-CM | POA: Diagnosis not present

## 2018-10-15 DIAGNOSIS — L570 Actinic keratosis: Secondary | ICD-10-CM | POA: Diagnosis not present

## 2018-10-15 DIAGNOSIS — L718 Other rosacea: Secondary | ICD-10-CM | POA: Diagnosis not present

## 2018-10-15 DIAGNOSIS — Z85828 Personal history of other malignant neoplasm of skin: Secondary | ICD-10-CM | POA: Diagnosis not present

## 2018-10-15 DIAGNOSIS — L821 Other seborrheic keratosis: Secondary | ICD-10-CM | POA: Diagnosis not present

## 2018-10-25 ENCOUNTER — Other Ambulatory Visit: Payer: Self-pay | Admitting: Family Medicine

## 2018-11-22 ENCOUNTER — Other Ambulatory Visit: Payer: Self-pay | Admitting: Cardiovascular Disease

## 2018-11-28 DIAGNOSIS — Z85828 Personal history of other malignant neoplasm of skin: Secondary | ICD-10-CM | POA: Diagnosis not present

## 2018-11-28 DIAGNOSIS — L821 Other seborrheic keratosis: Secondary | ICD-10-CM | POA: Diagnosis not present

## 2018-12-09 ENCOUNTER — Other Ambulatory Visit: Payer: Self-pay

## 2018-12-09 ENCOUNTER — Encounter (HOSPITAL_COMMUNITY): Payer: Self-pay

## 2018-12-09 ENCOUNTER — Emergency Department (HOSPITAL_COMMUNITY): Payer: Medicare Other

## 2018-12-09 ENCOUNTER — Observation Stay (HOSPITAL_COMMUNITY)
Admission: EM | Admit: 2018-12-09 | Discharge: 2018-12-10 | Disposition: A | Discharge status: Home / Self Care | Payer: Medicare Other | Attending: Internal Medicine | Admitting: Internal Medicine

## 2018-12-09 DIAGNOSIS — Z1159 Encounter for screening for other viral diseases: Secondary | ICD-10-CM | POA: Diagnosis not present

## 2018-12-09 DIAGNOSIS — M109 Gout, unspecified: Secondary | ICD-10-CM | POA: Insufficient documentation

## 2018-12-09 DIAGNOSIS — Z7902 Long term (current) use of antithrombotics/antiplatelets: Secondary | ICD-10-CM | POA: Insufficient documentation

## 2018-12-09 DIAGNOSIS — R000 Tachycardia, unspecified: Secondary | ICD-10-CM | POA: Diagnosis not present

## 2018-12-09 DIAGNOSIS — R079 Chest pain, unspecified: Principal | ICD-10-CM | POA: Diagnosis present

## 2018-12-09 DIAGNOSIS — Z79899 Other long term (current) drug therapy: Secondary | ICD-10-CM | POA: Diagnosis not present

## 2018-12-09 DIAGNOSIS — Z955 Presence of coronary angioplasty implant and graft: Secondary | ICD-10-CM | POA: Insufficient documentation

## 2018-12-09 DIAGNOSIS — I252 Old myocardial infarction: Secondary | ICD-10-CM | POA: Diagnosis not present

## 2018-12-09 DIAGNOSIS — F419 Anxiety disorder, unspecified: Secondary | ICD-10-CM | POA: Diagnosis not present

## 2018-12-09 DIAGNOSIS — R0789 Other chest pain: Secondary | ICD-10-CM | POA: Diagnosis not present

## 2018-12-09 DIAGNOSIS — Z7952 Long term (current) use of systemic steroids: Secondary | ICD-10-CM | POA: Diagnosis not present

## 2018-12-09 DIAGNOSIS — E785 Hyperlipidemia, unspecified: Secondary | ICD-10-CM | POA: Diagnosis not present

## 2018-12-09 DIAGNOSIS — I10 Essential (primary) hypertension: Secondary | ICD-10-CM | POA: Diagnosis not present

## 2018-12-09 DIAGNOSIS — I959 Hypotension, unspecified: Secondary | ICD-10-CM | POA: Diagnosis not present

## 2018-12-09 DIAGNOSIS — Z20828 Contact with and (suspected) exposure to other viral communicable diseases: Secondary | ICD-10-CM | POA: Diagnosis not present

## 2018-12-09 DIAGNOSIS — K219 Gastro-esophageal reflux disease without esophagitis: Secondary | ICD-10-CM | POA: Diagnosis present

## 2018-12-09 DIAGNOSIS — J449 Chronic obstructive pulmonary disease, unspecified: Secondary | ICD-10-CM | POA: Diagnosis not present

## 2018-12-09 DIAGNOSIS — I2510 Atherosclerotic heart disease of native coronary artery without angina pectoris: Secondary | ICD-10-CM | POA: Diagnosis not present

## 2018-12-09 DIAGNOSIS — R0902 Hypoxemia: Secondary | ICD-10-CM | POA: Diagnosis not present

## 2018-12-09 DIAGNOSIS — Z7982 Long term (current) use of aspirin: Secondary | ICD-10-CM | POA: Insufficient documentation

## 2018-12-09 DIAGNOSIS — F17210 Nicotine dependence, cigarettes, uncomplicated: Secondary | ICD-10-CM | POA: Insufficient documentation

## 2018-12-09 DIAGNOSIS — R42 Dizziness and giddiness: Secondary | ICD-10-CM | POA: Diagnosis not present

## 2018-12-09 DIAGNOSIS — Z8249 Family history of ischemic heart disease and other diseases of the circulatory system: Secondary | ICD-10-CM | POA: Diagnosis not present

## 2018-12-09 LAB — SARS CORONAVIRUS 2 BY RT PCR (HOSPITAL ORDER, PERFORMED IN ~~LOC~~ HOSPITAL LAB): SARS Coronavirus 2: NEGATIVE

## 2018-12-09 LAB — CBC WITH DIFFERENTIAL/PLATELET
Abs Immature Granulocytes: 0.03 10*3/uL (ref 0.00–0.07)
Basophils Absolute: 0.1 10*3/uL (ref 0.0–0.1)
Basophils Relative: 1 %
Eosinophils Absolute: 0.1 10*3/uL (ref 0.0–0.5)
Eosinophils Relative: 2 %
HCT: 39.8 % (ref 39.0–52.0)
Hemoglobin: 13.5 g/dL (ref 13.0–17.0)
Immature Granulocytes: 1 %
Lymphocytes Relative: 28 %
Lymphs Abs: 1.8 10*3/uL (ref 0.7–4.0)
MCH: 34.1 pg — ABNORMAL HIGH (ref 26.0–34.0)
MCHC: 33.9 g/dL (ref 30.0–36.0)
MCV: 100.5 fL — ABNORMAL HIGH (ref 80.0–100.0)
Monocytes Absolute: 0.8 10*3/uL (ref 0.1–1.0)
Monocytes Relative: 13 %
Neutro Abs: 3.5 10*3/uL (ref 1.7–7.7)
Neutrophils Relative %: 55 %
Platelets: 140 10*3/uL — ABNORMAL LOW (ref 150–400)
RBC: 3.96 MIL/uL — ABNORMAL LOW (ref 4.22–5.81)
RDW: 13.8 % (ref 11.5–15.5)
WBC: 6.3 10*3/uL (ref 4.0–10.5)
nRBC: 0 % (ref 0.0–0.2)

## 2018-12-09 LAB — COMPREHENSIVE METABOLIC PANEL
ALT: 19 U/L (ref 0–44)
AST: 30 U/L (ref 15–41)
Albumin: 4 g/dL (ref 3.5–5.0)
Alkaline Phosphatase: 66 U/L (ref 38–126)
Anion gap: 9 (ref 5–15)
BUN: 16 mg/dL (ref 8–23)
CO2: 22 mmol/L (ref 22–32)
Calcium: 9.2 mg/dL (ref 8.9–10.3)
Chloride: 103 mmol/L (ref 98–111)
Creatinine, Ser: 0.96 mg/dL (ref 0.61–1.24)
GFR calc Af Amer: >60 mL/min (ref >60)
GFR calc non Af Amer: >60 mL/min (ref >60)
Glucose, Bld: 155 mg/dL — ABNORMAL HIGH (ref 70–99)
Potassium: 3.9 mmol/L (ref 3.5–5.1)
Sodium: 134 mmol/L — ABNORMAL LOW (ref 135–145)
Total Bilirubin: 1.1 mg/dL (ref 0.3–1.2)
Total Protein: 6.8 g/dL (ref 6.5–8.1)

## 2018-12-09 LAB — TROPONIN I: Troponin I: <0.03 ng/mL (ref <0.03)

## 2018-12-09 IMAGING — CR CHEST - 2 VIEW
2 series · 2 of 2 positions shown · non-contrast
Comparison: [DATE]

CLINICAL DATA: Chest pain

EXAM:
CHEST - 2 VIEW

[chest pa]
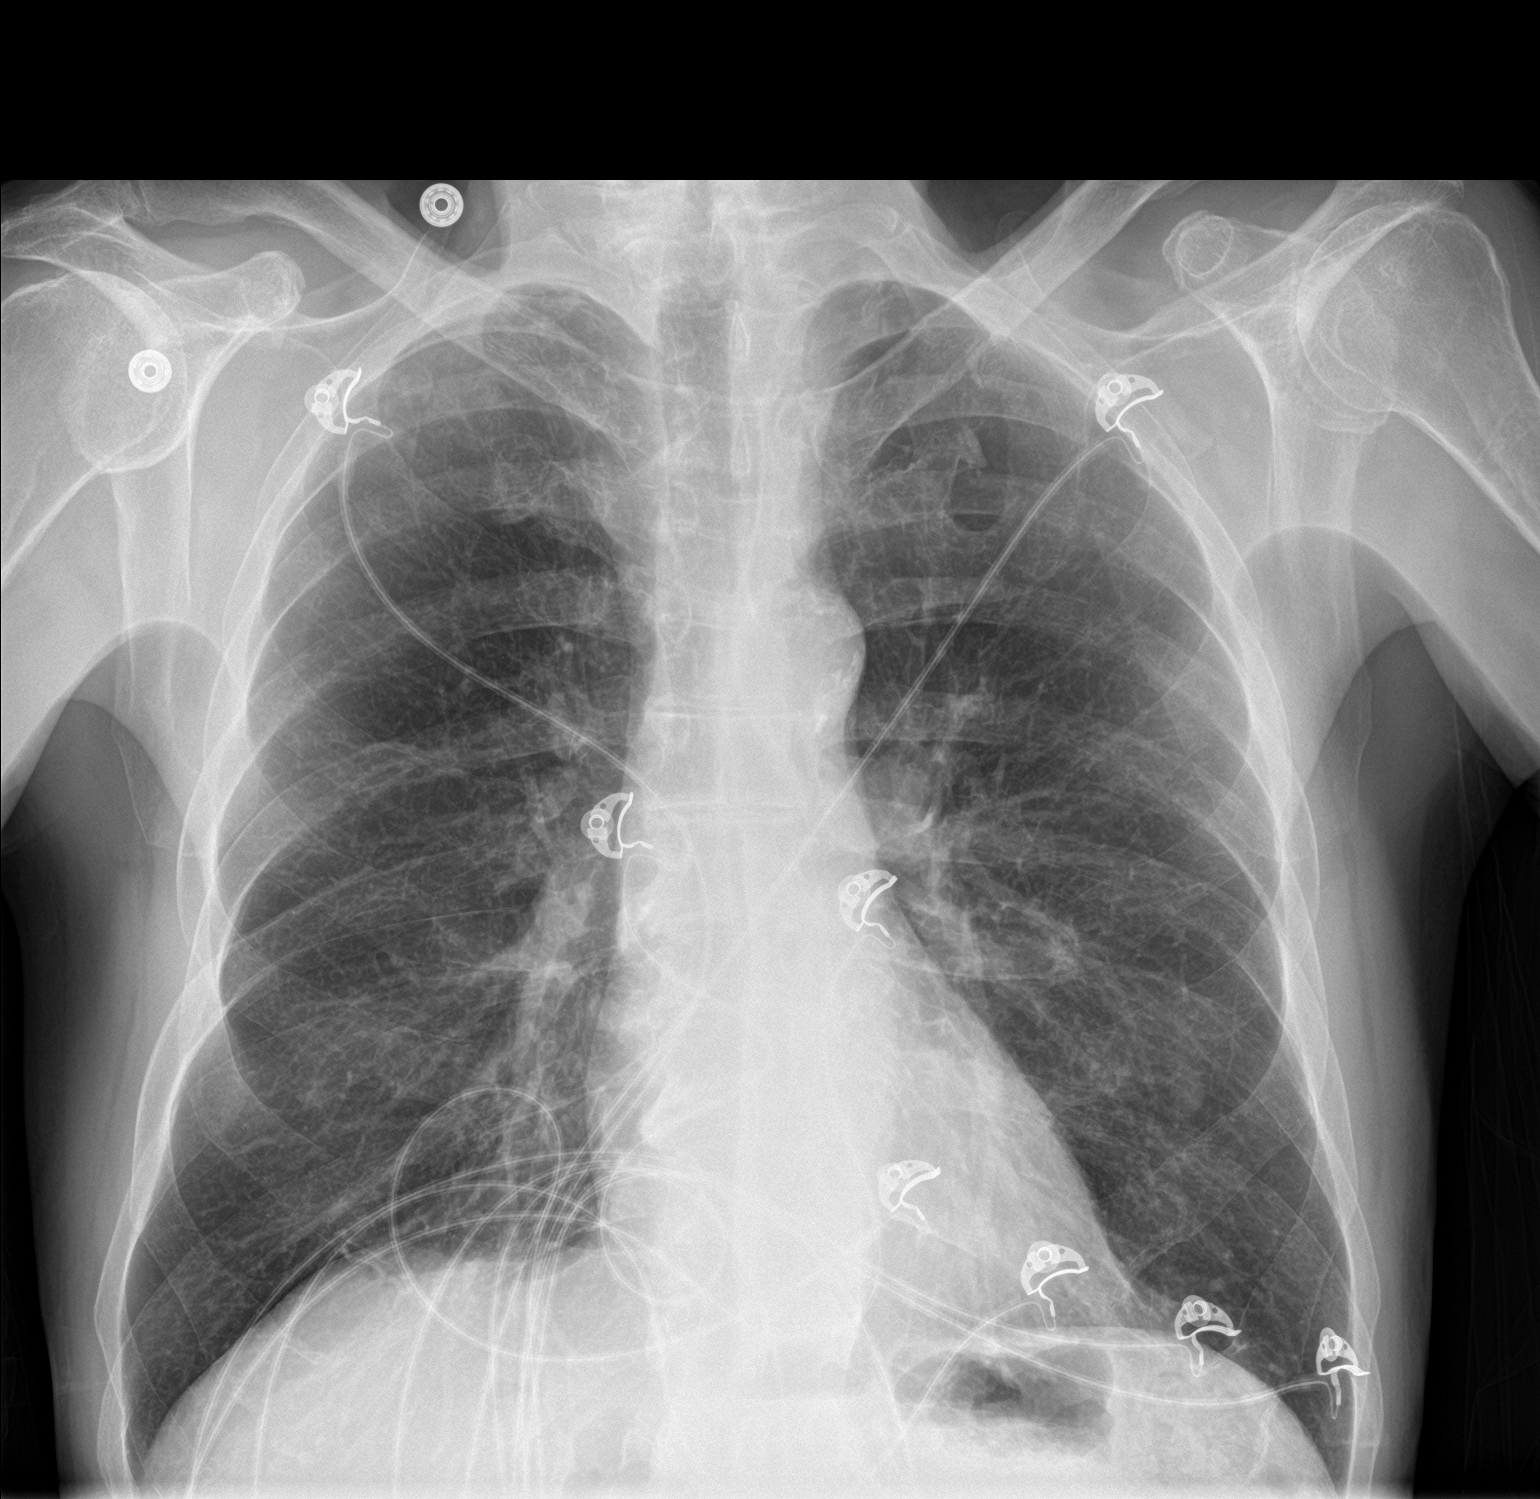

[chest lat]
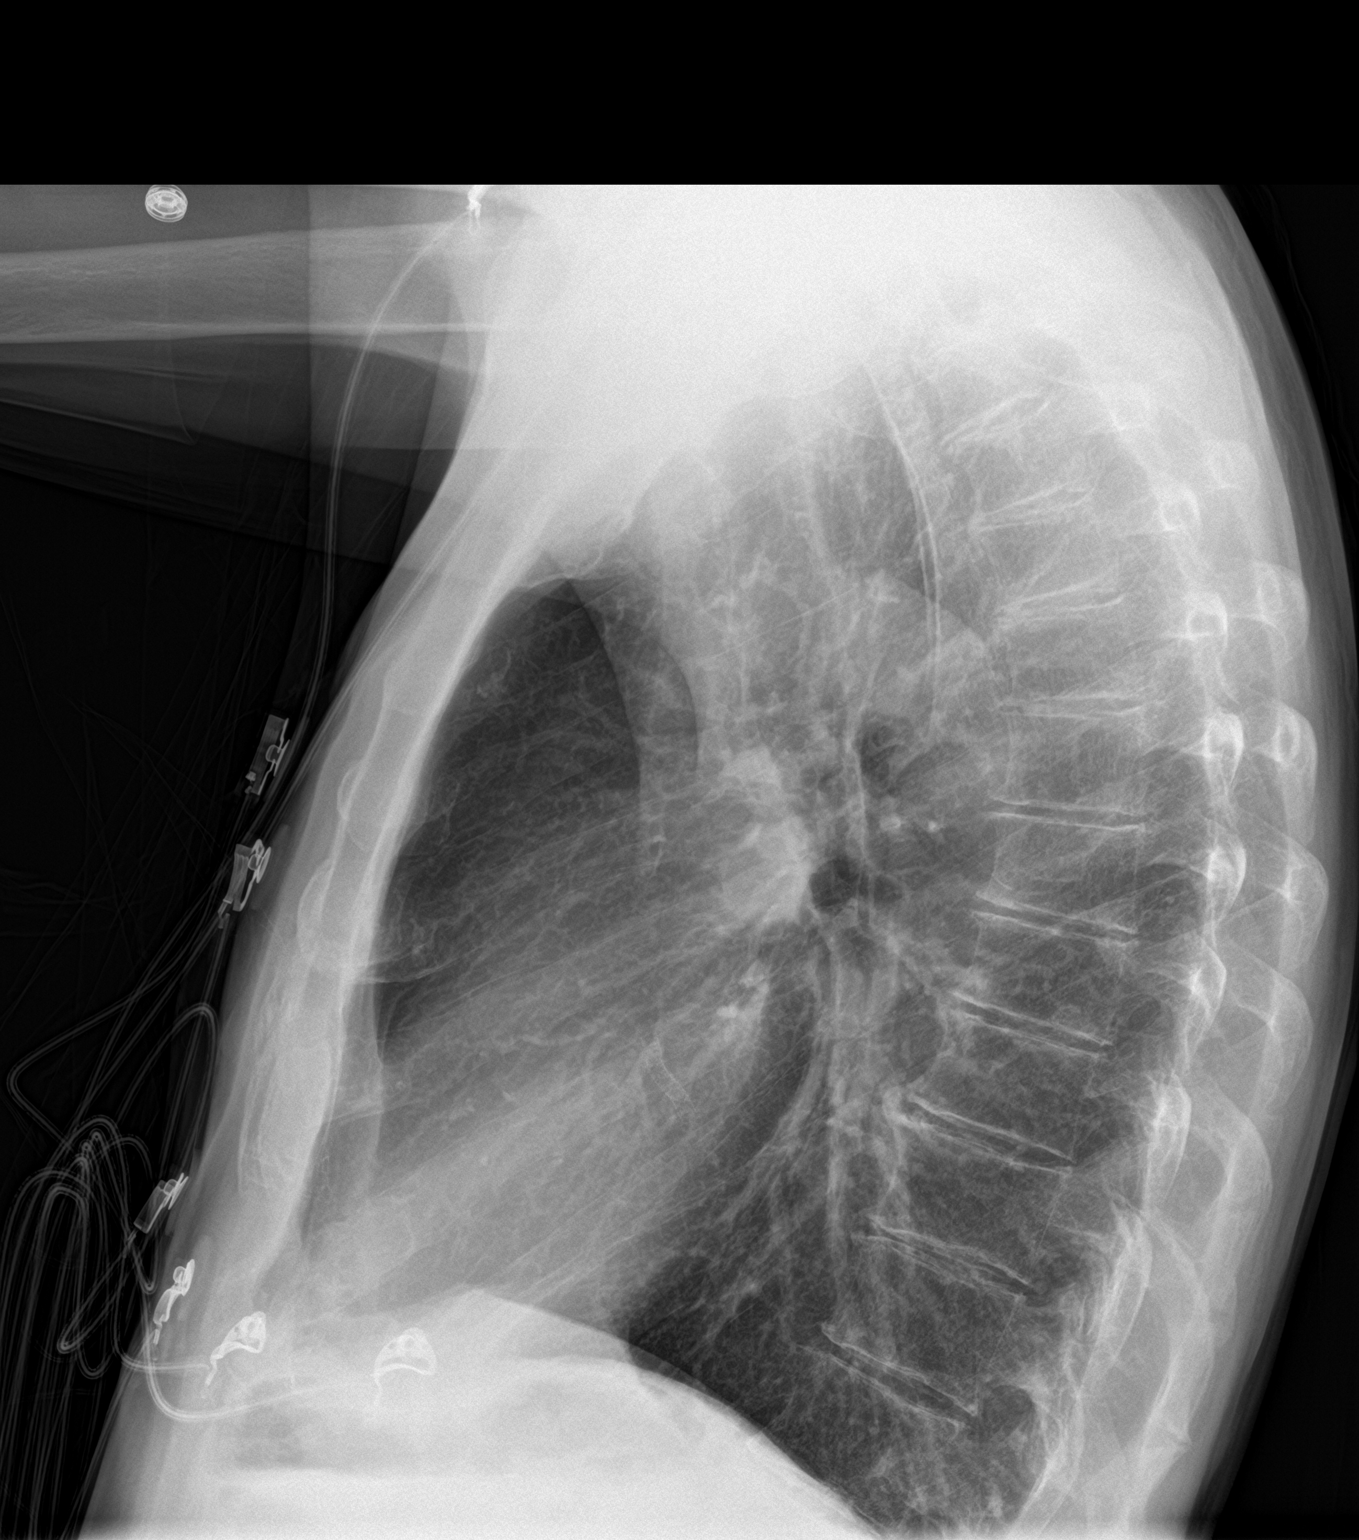

[2 of 2 positions shown; findings below may reference images not displayed]

FINDINGS: The lungs are hyperinflated with diffuse interstitial prominence. No
focal airspace consolidation or pulmonary edema. No pleural effusion
or pneumothorax. Normal cardiomediastinal contours.
IMPRESSION: COPD without acute airspace disease.

## 2018-12-09 MED ORDER — ONDANSETRON HCL 4 MG/2ML IJ SOLN: 4.0000 mg | Freq: Four times a day (QID) | INTRAMUSCULAR | Status: DC | PRN

## 2018-12-09 MED ORDER — HEPARIN (PORCINE) 25000 UT/250ML-% IV SOLN
900.0000 [IU]/h | INTRAVENOUS | Status: DC
  Administered 2018-12-09: 750 [IU]/h via INTRAVENOUS
  Filled 2018-12-09: qty 250

## 2018-12-09 MED ORDER — ACETAMINOPHEN 325 MG PO TABS: 650.0000 mg | ORAL_TABLET | ORAL | Status: DC | PRN

## 2018-12-09 MED ORDER — HEPARIN BOLUS VIA INFUSION
4000.0000 [IU] | Freq: Once | INTRAVENOUS | Status: AC
  Administered 2018-12-09: 4000 [IU] via INTRAVENOUS
  Filled 2018-12-09: qty 4000

## 2018-12-09 NOTE — ED Triage Notes (Signed)
Pt c/o of CP that started 1 day ago.  Reports it being off and on.  Pt states he took an aspirin last night and took one today as well.  Pt in no distress at this time.

## 2018-12-09 NOTE — ED Provider Notes (Signed)
[place] EMERGENCY DEPARTMENT Provider Note   CSN: [id number] Arrival date & time: 12/09/18  1954    History   Chief Complaint Chief Complaint  Patient presents with  . Chest Pain    HPI Jesus Jordan is a 73 y.o. male.     HPI   Flutter yesterday Chest pain center of the chest today, feels like a pressure, began around 12PM, comes and goes,  Happening spontaneously, felt generally weak walking but not necessarily exertional pain.  Center of chest radiating to each shoulder, No shortness of breath. No nausea. No diaphoresis. Did have some lightheadedness.  Pain was lower than 5/10, now improved  Took aspirin 324mg   L circumflex stent 2010, taking medications, sees Dr. Claiborne Billings This pain reminds him of MI, but is not as severe   Past Medical History:  Diagnosis Date  . CAD 01/29/2009   Qualifier: Diagnosis of  By: [name] MD, [name]    . GOUT 01/29/2009   Qualifier: Diagnosis of  By: [name] MD, [name]    . Gout   . Heart disease    cardiologist Dr Claiborne Billings  . History of MI (myocardial infarction) 09/13/2006   large inferolateral MI secondary to total occlusion of circumflex  . History of nuclear stress test 02/23/2012   exercise myoview; area of scar in inferolateral wall at mid-basal region  . Hyperlipidemia   . INGUINAL HERNIA 05/25/2009   Qualifier: Diagnosis of  By: [name] MD, [name]      Patient Active Problem List   Diagnosis Date Noted  . Chest pain 12/09/2018  . GERD (gastroesophageal reflux disease) 04/18/2018  . CAD in native artery 05/03/2016  . Old MI (myocardial infarction) 03/06/2013  . Hyperlipidemia with target LDL less than 70 03/06/2013  . Inguinal hernia 05/25/2009  . Gout 01/29/2009  . Coronary atherosclerosis 01/29/2009    Past Surgical History:  Procedure Laterality Date  . CORONARY STENT PLACEMENT  09/12/2008   r/t MI; 3.5x36mm Xience DES to circumflex & PTCA at multiple sites in distal region of vessel; alos diffusely  diseaase, narrowed, nondominant RCA  . FOOT SURGERY  1960  . HERNIA REPAIR  2011   left inguinal hernia  . TRANSTHORACIC ECHOCARDIOGRAM  03/02/2009   HQ=46-96%; LV systolic function borderline reduced with mild inferior wall hypokinesis; mild-mod MR; mild TR; AV mildly sclerotic; mild aortic root dilatation        Home Medications    Prior to Admission medications   Medication Sig Start Date End Date Taking? Authorizing Provider  allopurinol (ZYLOPRIM) 300 MG tablet Take 1 tablet by mouth once daily Patient taking differently: Take 300 mg by mouth daily.  10/29/18  Yes Burchette, Alinda Sierras, MD  aspirin 81 MG tablet Take 81 mg by mouth at bedtime.    Yes [provider]  calcium carbonate (TUMS - DOSED IN MG ELEMENTAL CALCIUM) 500 MG chewable tablet Chew 1-2 tablets by mouth as needed for indigestion or heartburn.   Yes [provider]  carvedilol (COREG) 12.5 MG tablet Take 1 tablet (12.5 mg total) by mouth every morning AND 0.5 tablets (6.25 mg total) every evening. Patient taking differently: Take 12.5 mg by mouth in the morning and 12.5 mg in the evening 04/20/18  Yes Troy Sine, MD  clopidogrel (PLAVIX) 75 MG tablet Take 1 tablet (75 mg total) by mouth daily. 08/17/18  Yes Troy Sine, MD  colchicine 0.6 MG tablet Take 0.6 mg by mouth daily as needed (as directed- for gout  flares).    Yes [provider]  ezetimibe (ZETIA) 10 MG tablet Take 1 tablet (10 mg total) by mouth daily. Patient taking differently: Take 10 mg by mouth at bedtime.  08/17/18  Yes Troy Sine, MD  fluticasone (CUTIVATE) 0.05 % cream Apply 1 application topically daily as needed (to affected areas of face).  02/12/13  Yes [provider]  lisinopril (ZESTRIL) 5 MG tablet Take 1 tablet by mouth once daily Patient taking differently: Take 5 mg by mouth daily.  11/22/18  Yes Troy Sine, MD  Multiple Vitamins-Minerals (ONE-A-DAY MENS 50+ ADVANTAGE) TABS Take 1 tablet by  mouth daily.   Yes [provider]  nitroGLYCERIN (NITROSTAT) 0.4 MG SL tablet DISSOLVE ONE TABLET UNDER THE TONGUE EVERY 5 MINUTES AS NEEDED FOR CHEST PAIN.  DO NOT EXCEED A TOTAL OF 3 DOSES IN 15 MINUTES Patient taking differently: Place 0.4 mg under the tongue every 5 (five) minutes x 3 doses as needed for chest pain.  07/02/18  Yes Troy Sine, MD  simvastatin (ZOCOR) 40 MG tablet Take 1 tablet (40 mg total) by mouth at bedtime. 08/17/18  Yes Troy Sine, MD  tetrahydrozoline-zinc (VISINE-AC) 0.05-0.25 % ophthalmic solution Place 2 drops into both eyes 3 (three) times daily as needed (for itching ot redness).   Yes [provider]  isosorbide mononitrate (IMDUR) 30 MG 24 hr tablet TAKE ONE TABLET BY MOUTH ONCE DAILY Patient not taking: Reported on 12/09/2018 08/01/17   Troy Sine, MD  pantoprazole (PROTONIX) 40 MG tablet TAKE 1 TABLET BY MOUTH ONCE DAILY AS NEEDED Patient not taking: No sig reported 10/03/17   Troy Sine, MD    Family History Family History  Problem Relation Age of Onset  . Colon cancer Father   . Hyperlipidemia Brother   . Heart disease Brother 28       massive MI  . Cancer Paternal Grandfather        colon    Social History Social History   Tobacco Use  . Smoking status: Current Some Day Smoker    Years: 30.00    Types: Cigarettes  . Smokeless tobacco: Never Used  . Tobacco comment: skip days some; pack last 3 days   Substance Use Topics  . Alcohol use: Yes    Comment: occasional   . Drug use: No     Allergies   Imdur [isosorbide nitrate]   Review of Systems Review of Systems  Constitutional: Negative for fever.  HENT: Negative for congestion.   Respiratory: Negative for cough and shortness of breath.   Cardiovascular: Positive for chest pain. Negative for leg swelling.  Gastrointestinal: Negative for abdominal pain, nausea and vomiting.  Musculoskeletal: Negative for back pain.  Skin: Negative for rash.   Neurological: Positive for light-headedness. Negative for headaches.     Physical Exam Updated Vital Signs BP 138/85   Pulse 72   Temp 98.1 F (36.7 C) (Oral)   Resp 18   Ht 5\' 8"  (1.727 m)   Wt 63.5 kg   SpO2 100%   BMI 21.29 kg/m   Physical Exam Vitals signs and nursing note reviewed.  Constitutional:      General: He is not in acute distress.    Appearance: He is well-developed. He is not diaphoretic.  HENT:     Head: Normocephalic and atraumatic.  Eyes:     Conjunctiva/sclera: Conjunctivae normal.  Neck:     Musculoskeletal: Normal range of motion.  Cardiovascular:  Rate and Rhythm: Normal rate and regular rhythm.     Heart sounds: Normal heart sounds.  Pulmonary:     Effort: Pulmonary effort is normal. No respiratory distress.     Breath sounds: Normal breath sounds.  Abdominal:     General: There is no distension.     Palpations: Abdomen is soft.     Tenderness: There is no abdominal tenderness. There is no guarding.  Skin:    General: Skin is warm and dry.  Neurological:     Mental Status: He is alert and oriented to person, place, and time.      ED Treatments / Results  Labs (all labs ordered are listed, but only abnormal results are displayed) Labs Reviewed  CBC WITH DIFFERENTIAL/PLATELET - Abnormal; Notable for the following components:      Result Value   RBC 3.96 (*)    MCV 100.5 (*)    MCH 34.1 (*)    Platelets 140 (*)    All other components within normal limits  COMPREHENSIVE METABOLIC PANEL - Abnormal; Notable for the following components:   Sodium 134 (*)    Glucose, Bld 155 (*)    All other components within normal limits  SARS CORONAVIRUS 2 (HOSPITAL ORDER, [facility])  TROPONIN I  HEPARIN LEVEL (UNFRACTIONATED)    EKG EKG Interpretation  Date/Time:  Sunday December 09 2018 20:03:45 EDT Ventricular Rate:  77 PR Interval:    QRS Duration: 98 QT Interval:  385 QTC Calculation: 436 R Axis:   41 Text  Interpretation:  Sinus rhythm Ventricular premature complex Borderline low voltage, extremity leads No significant change since last tracing Confirmed by ,  ([id number]) on 12/09/2018 8:27:37 PM   Radiology Dg Chest 2 View  Result Date: 12/09/2018 CLINICAL DATA:  Chest pain EXAM: CHEST - 2 VIEW COMPARISON:  03/21/2016 FINDINGS: The lungs are hyperinflated with diffuse interstitial prominence. No focal airspace consolidation or pulmonary edema. No pleural effusion or pneumothorax. Normal cardiomediastinal contours. IMPRESSION: COPD without acute airspace disease. Electronically Signed   By: Kevin  Herman M.D.   On: 12/09/2018 20:56    Procedures Procedures (including critical care time)  Medications Ordered in ED Medications  acetaminophen (TYLENOL) tablet 650 mg (has no administration in time range)  ondansetron (ZOFRAN) injection 4 mg (has no administration in time range)  heparin ADULT infusion 100 units/mL (25000 units/250mL sodium chloride 0.45%) (750 Units/hr Intravenous New Bag/Given 12/09/18 2322)  heparin bolus via infusion 4,000 Units (4,000 Units Intravenous Bolus from Bag 12/09/18 2322)     Initial Impression / Assessment and Plan / ED Course  I have reviewed the triage vital signs and the nursing notes.  Pertinent labs & imaging results that were available during my care of the patient were reviewed by me and considered in my medical decision making (see chart for details).        72  year old male with history of coronary artery disease and stent placement in 2010 who sees Dr. Claiborne Billings, hyperlipidemia, presents with concern for central chest pain.  Patient reports this pain reminds him of the pain that he had with his prior heart attack.  EKG without acute findings, and initial troponin is negative.  Labs show no other significant abnormalities.  Have low suspicion for pulmonary embolus, aortic dissection, pneumonia, pneumothorax, by history, physical exam, x-Clayson  findings.  Given his history of coronary artery disease and symptoms, will admit for further evaluation with hospitalist and cardiology.  Final Clinical Impressions(s) / ED  Diagnoses   Final diagnoses:  Chest pain with high risk for cardiac etiology    ED Discharge Orders    None       Gareth Morgan, MD 12/10/18 [phone]

## 2018-12-09 NOTE — Progress Notes (Signed)
ANTICOAGULATION CONSULT NOTE - Initial Consult  Pharmacy Consult for Heparin Indication: chest pain/ACS  No Known Allergies  Patient Measurements: Height: 5\' 8"  (172.7 cm) Weight: 140 lb (63.5 kg) IBW/kg (Calculated) : 68.4  Vital Signs: Temp: 98.1 F (36.7 C) (06/21 2008) Temp Source: Oral (06/21 2008) BP: 117/69 (06/21 2008) Pulse Rate: 73 (06/21 2008)  Labs: Recent Labs    12/09/18 2004  HGB 13.5  HCT 39.8  PLT 140*  CREATININE 0.96  TROPONINI <0.03    Estimated Creatinine Clearance: 63.4 mL/min (by C-G formula based on SCr of 0.96 mg/dL).   Medical History: Past Medical History:  Diagnosis Date  . CAD 01/29/2009   Qualifier: Diagnosis of  By: [name] MD, [name]    . GOUT 01/29/2009   Qualifier: Diagnosis of  By: [name] MD, [name]    . Gout   . Heart disease    cardiologist Dr Claiborne Billings  . History of MI (myocardial infarction) 09/13/2006   large inferolateral MI secondary to total occlusion of circumflex  . History of nuclear stress test 02/23/2012   exercise myoview; area of scar in inferolateral wall at mid-basal region  . Hyperlipidemia   . INGUINAL HERNIA 05/25/2009   Qualifier: Diagnosis of  By: [name] MD, [name]      Medications:  Current Facility-Administered Medications on File Prior to Encounter  Medication Dose Route Frequency Provider Last Rate Last Dose  . zoster vaccine live (PF) (ZOSTAVAX) injection 19,400 Units  0.65 mL Subcutaneous Once Eulas Post, MD       Current Outpatient Medications on File Prior to Encounter  Medication Sig Dispense Refill  . allopurinol (ZYLOPRIM) 300 MG tablet Take 1 tablet by mouth once daily (Patient taking differently: Take 300 mg by mouth daily. ) 90 tablet 0  . aspirin 81 MG tablet Take 81 mg by mouth at bedtime.     . carvedilol (COREG) 12.5 MG tablet Take 1 tablet (12.5 mg total) by mouth every morning AND 0.5 tablets (6.25 mg total) every evening. (Patient taking differently: Take 12.5 mg by mouth in  the morning and 12.5 mg in the evening) 135 tablet 3  . clopidogrel (PLAVIX) 75 MG tablet Take 1 tablet (75 mg total) by mouth daily. 90 tablet 2  . colchicine 0.6 MG tablet Take 0.6 mg by mouth as needed (for gout flares).     . ezetimibe (ZETIA) 10 MG tablet Take 1 tablet (10 mg total) by mouth daily. (Patient taking differently: Take 10 mg by mouth at bedtime. ) 90 tablet 2  . fluticasone (CUTIVATE) 0.05 % cream Apply 1 application topically daily as needed (to affected areas of face).     Marland Kitchen lisinopril (ZESTRIL) 5 MG tablet Take 1 tablet by mouth once daily (Patient taking differently: Take 5 mg by mouth daily. ) 90 tablet 1  . Multiple Vitamins-Minerals (ONE-A-DAY MENS 50+ ADVANTAGE) TABS Take 1 tablet by mouth daily.    . nitroGLYCERIN (NITROSTAT) 0.4 MG SL tablet DISSOLVE ONE TABLET UNDER THE TONGUE EVERY 5 MINUTES AS NEEDED FOR CHEST PAIN.  DO NOT EXCEED A TOTAL OF 3 DOSES IN 15 MINUTES (Patient taking differently: Place 0.4 mg under the tongue every 5 (five) minutes x 3 doses as needed for chest pain. ) 25 tablet 3  . simvastatin (ZOCOR) 40 MG tablet Take 1 tablet (40 mg total) by mouth at bedtime. 90 tablet 2  . isosorbide mononitrate (IMDUR) 30 MG 24 hr tablet TAKE ONE TABLET BY MOUTH ONCE DAILY (Patient not taking:  Reported on 12/09/2018) 90 tablet 0  . pantoprazole (PROTONIX) 40 MG tablet TAKE 1 TABLET BY MOUTH ONCE DAILY AS NEEDED (Patient not taking: No sig reported) 30 tablet 4  . [DISCONTINUED] allopurinol (ZYLOPRIM) 100 MG tablet TAKE 2 TABLETS BY MOUTH DAILY (Patient not taking: Reported on 12/09/2018) 180 tablet 0  . [DISCONTINUED] Azelaic Acid 15 % cream Apply 1 application topically daily. FACE       Assessment: 73 y.o. male with chest pain for heparin  Goal of Therapy:  Heparin level 0.3-0.7 units/ml Monitor platelets by anticoagulation protocol: Yes   Plan:  Heparin 4000 units IV bolus, then start heparin 750 units/hr Check heparin level in 8 hours.   Caryl Pina 12/09/2018,11:00 PM

## 2018-12-09 NOTE — H&P (Signed)
History and Physical   Lister Brizzi IDP:[id number] DOB: 30-Oct-1945 DOA: 12/09/2018  Referring MD/NP/PA: Dr. Billy Fischer  PCP: Eulas Post, MD   Outpatient Specialists: Dr. Shelva Majestic, cardiology  Patient coming from: Home  Chief Complaint: Chest pain  HPI: Jesus Jordan is a 73 y.o. male with medical history significant of coronary artery disease status post stent placement DES to circumflex and PTCA at multiple sites in March 2010, hypertension, hyperlipidemia, who presents to the ER with substernal chest pain and pressure today.  Patient panicked because he felt the same as what he felt during his last heart attack in 2010.  It is more pressure rated as 6 out of 10.  Currently relieved in the ER.  Denied any diaphoresis.  Denied any nausea vomiting or diarrhea.  He has been taking his medications and following up with his cardiologist.  He has an appointment coming up in August.  Patient had a stress test about 4 years ago and nothing since then.  He has been relatively stable.  So far in the ER no evidence of acute coronary syndrome.  Due to his risk factors he is being admitted for rule out MI.  ED Course: Temperature 98.1 blood pressure 117/69 pulse 73 respirate of 18 oxygen sats 100% room air.  White count 6.3 hemoglobin 13.5 and platelet 140.  Chemistry appears to be within normal troponin less than 0.03.  Chest x-Voeltz showed COPD with no acute findings.  EKG is within normal.  Patient has been admitted for observation and MI rule out.  Review of Systems: As per HPI otherwise 10 point review of systems negative.    Past Medical History:  Diagnosis Date  . CAD 01/29/2009   Qualifier: Diagnosis of  By: [name] MD, [name]    . GOUT 01/29/2009   Qualifier: Diagnosis of  By: [name] MD, [name]    . Gout   . Heart disease    cardiologist Dr Claiborne Billings  . History of MI (myocardial infarction) 09/13/2006   large inferolateral MI secondary to total occlusion of circumflex  . History of  nuclear stress test 02/23/2012   exercise myoview; area of scar in inferolateral wall at mid-basal region  . Hyperlipidemia   . INGUINAL HERNIA 05/25/2009   Qualifier: Diagnosis of  By: [name] MD, [name]      Past Surgical History:  Procedure Laterality Date  . CORONARY STENT PLACEMENT  09/12/2008   r/t MI; 3.5x71mm Xience DES to circumflex & PTCA at multiple sites in distal region of vessel; alos diffusely diseaase, narrowed, nondominant RCA  . FOOT SURGERY  1960  . HERNIA REPAIR  2011   left inguinal hernia  . TRANSTHORACIC ECHOCARDIOGRAM  03/02/2009   WE=31-54%; LV systolic function borderline reduced with mild inferior wall hypokinesis; mild-mod MR; mild TR; AV mildly sclerotic; mild aortic root dilatation     reports that he has been smoking cigarettes. He has smoked for the past 30.00 years. He has never used smokeless tobacco. He reports current alcohol use. He reports that he does not use drugs.  No Known Allergies  Family History  Problem Relation Age of Onset  . Colon cancer Father   . Hyperlipidemia Brother   . Heart disease Brother 59       massive MI  . Cancer Paternal Grandfather        colon     Prior to Admission medications   Medication Sig Start Date End Date Taking? Authorizing Provider  allopurinol (ZYLOPRIM) 100 MG tablet TAKE  2 TABLETS BY MOUTH DAILY Patient not taking: Reported on 05/30/2018 02/12/18   Eulas Post, MD  allopurinol (ZYLOPRIM) 300 MG tablet Take 1 tablet by mouth once daily 10/29/18   Burchette, Alinda Sierras, MD  aspirin 81 MG tablet Take 81 mg by mouth daily.    [provider]  carvedilol (COREG) 12.5 MG tablet Take 1 tablet (12.5 mg total) by mouth every morning AND 0.5 tablets (6.25 mg total) every evening. 04/20/18   Troy Sine, MD  clopidogrel (PLAVIX) 75 MG tablet Take 1 tablet (75 mg total) by mouth daily. 08/17/18   Troy Sine, MD  colchicine 0.6 MG tablet Take 0.6 mg by mouth as needed.    [provider]   ezetimibe (ZETIA) 10 MG tablet Take 1 tablet (10 mg total) by mouth daily. 08/17/18   Troy Sine, MD  fluticasone (CUTIVATE) 0.05 % cream  02/12/13   [provider]  isosorbide mononitrate (IMDUR) 30 MG 24 hr tablet TAKE ONE TABLET BY MOUTH ONCE DAILY 08/01/17   Troy Sine, MD  lisinopril (ZESTRIL) 5 MG tablet Take 1 tablet by mouth once daily 11/22/18   Troy Sine, MD  nitroGLYCERIN (NITROSTAT) 0.4 MG SL tablet DISSOLVE ONE TABLET UNDER THE TONGUE EVERY 5 MINUTES AS NEEDED FOR CHEST PAIN.  DO NOT EXCEED A TOTAL OF 3 DOSES IN 15 MINUTES 07/02/18   Troy Sine, MD  pantoprazole (PROTONIX) 40 MG tablet TAKE 1 TABLET BY MOUTH ONCE DAILY AS NEEDED 10/03/17   Troy Sine, MD  simvastatin (ZOCOR) 40 MG tablet Take 1 tablet (40 mg total) by mouth at bedtime. 08/17/18   Troy Sine, MD    Physical Exam: Vitals:   12/09/18 2008 12/09/18 2010  BP: 117/69   Pulse: 73   Resp: 18   Temp: 98.1 F (36.7 C)   TempSrc: Oral   SpO2: 100%   Weight:  63.5 kg  Height:  5\' 8"  (1.727 m)      Constitutional: NAD, very anxious Vitals:   12/09/18 2008 12/09/18 2010  BP: 117/69   Pulse: 73   Resp: 18   Temp: 98.1 F (36.7 C)   TempSrc: Oral   SpO2: 100%   Weight:  63.5 kg  Height:  5\' 8"  (1.727 m)   Eyes: PERRL, lids and conjunctivae normal ENMT: Mucous membranes are moist. Posterior pharynx clear of any exudate or lesions.Normal dentition.  Neck: normal, supple, no masses, no thyromegaly Respiratory: clear to auscultation bilaterally, no wheezing, no crackles. Normal respiratory effort. No accessory muscle use.  Cardiovascular: Regular rate and rhythm, no murmurs / rubs / gallops. No extremity edema. 2+ pedal pulses. No carotid bruits.  Abdomen: no tenderness, no masses palpated. No hepatosplenomegaly. Bowel sounds positive.  Musculoskeletal: no clubbing / cyanosis. No joint deformity upper and lower extremities. Good ROM, no contractures. Normal muscle tone.  Skin:  no rashes, lesions, ulcers. No induration Neurologic: CN 2-12 grossly intact. Sensation intact, DTR normal. Strength 5/5 in all 4.  Psychiatric: Normal judgment and insight. Alert and oriented x 3. Normal mood.     Labs on Admission: I have personally reviewed following labs and imaging studies  CBC: Recent Labs  Lab 12/09/18 2004  WBC 6.3  NEUTROABS 3.5  HGB 13.5  HCT 39.8  MCV 100.5*  PLT 301*   Basic Metabolic Panel: Recent Labs  Lab 12/09/18 2004  NA 134*  K 3.9  CL 103  CO2 22  GLUCOSE 155*  BUN 16  CREATININE 0.96  CALCIUM 9.2   GFR: Estimated Creatinine Clearance: 63.4 mL/min (by C-G formula based on SCr of 0.96 mg/dL). Liver Function Tests: Recent Labs  Lab 12/09/18 2004  AST 30  ALT 19  ALKPHOS 66  BILITOT 1.1  PROT 6.8  ALBUMIN 4.0   No results for input(s): LIPASE, AMYLASE in the last 168 hours. No results for input(s): AMMONIA in the last 168 hours. Coagulation Profile: No results for input(s): INR, PROTIME in the last 168 hours. Cardiac Enzymes: Recent Labs  Lab 12/09/18 2004  TROPONINI <0.03   BNP (last 3 results) No results for input(s): PROBNP in the last 8760 hours. HbA1C: No results for input(s): HGBA1C in the last 72 hours. CBG: No results for input(s): GLUCAP in the last 168 hours. Lipid Profile: No results for input(s): CHOL, HDL, LDLCALC, TRIG, CHOLHDL, LDLDIRECT in the last 72 hours. Thyroid Function Tests: No results for input(s): TSH, T4TOTAL, FREET4, T3FREE, THYROIDAB in the last 72 hours. Anemia Panel: No results for input(s): VITAMINB12, FOLATE, FERRITIN, TIBC, IRON, RETICCTPCT in the last 72 hours. Urine analysis: No results found for: COLORURINE, APPEARANCEUR, LABSPEC, PHURINE, GLUCOSEU, HGBUR, BILIRUBINUR, KETONESUR, PROTEINUR, UROBILINOGEN, NITRITE, LEUKOCYTESUR Sepsis Labs: @LABRCNTIP (procalcitonin:4,lacticidven:4) )No results found for this or any previous visit (from the past 240 hour(s)).   Radiological Exams  on Admission: Dg Chest 2 View  Result Date: 12/09/2018 CLINICAL DATA:  Chest pain EXAM: CHEST - 2 VIEW COMPARISON:  03/21/2016 FINDINGS: The lungs are hyperinflated with diffuse interstitial prominence. No focal airspace consolidation or pulmonary edema. No pleural effusion or pneumothorax. Normal cardiomediastinal contours. IMPRESSION: COPD without acute airspace disease. Electronically Signed   By: Ulyses Jarred M.D.   On: 12/09/2018 20:56    EKG: Independently reviewed.  Sinus rhythm with rate of 68, normal intervals, nonspecific ST changes  Assessment/Plan Principal Problem:   Chest pain Active Problems:   Hyperlipidemia with target LDL less than 70   CAD in native artery   GERD (gastroesophageal reflux disease)     #1 chest pain: Probably stable angina.  Patient has significant history of coronary artery disease with stent in place.  He is on relevant medications and has been compliant.  We will admit and cycle enzymes.  If enzymes are negative x3 I would recommend patient have either in or outpatient cardiac stress testing.  We will contact Dr. Shelva Majestic in the morning for guidance.  #2 hypertension: Continue with home regimen and monitor blood pressure.  #3 hyperlipidemia: Continue with statin.  #4 GERD: Continue with PPIs  #5 history of gout: No gouty attack.  Appears to be at baseline.   DVT prophylaxis: Lovenox Code Status: Full code Family Communication: Discussed care with the patient.  No family available Disposition Plan: Home Consults called: None but please contact Dr. Shelva Majestic in the morning Admission status: Observation  Severity of Illness: The appropriate patient status for this patient is OBSERVATION. Observation status is judged to be reasonable and necessary in order to provide the required intensity of service to ensure the patient's safety. The patient's presenting symptoms, physical exam findings, and initial radiographic and laboratory data in  the context of their medical condition is felt to place them at decreased risk for further clinical deterioration. Furthermore, it is anticipated that the patient will be medically stable for discharge from the hospital within 2 midnights of admission. The following factors support the patient status of observation.   " The patient's presenting symptoms include chest pain. " The  physical exam findings include no significant findings. " The initial radiographic and laboratory data are normal EKG and enzymes.     Barbette Merino MD [facility] Pager [phone]  If 7PM-7AM, please contact night-coverage [email or web address] Password [facility]  12/09/2018, 10:58 PM

## 2018-12-10 ENCOUNTER — Other Ambulatory Visit: Payer: Self-pay | Admitting: Medical

## 2018-12-10 ENCOUNTER — Encounter (HOSPITAL_COMMUNITY): Payer: Self-pay | Admitting: Internal Medicine

## 2018-12-10 DIAGNOSIS — J449 Chronic obstructive pulmonary disease, unspecified: Secondary | ICD-10-CM

## 2018-12-10 DIAGNOSIS — I10 Essential (primary) hypertension: Secondary | ICD-10-CM | POA: Diagnosis not present

## 2018-12-10 DIAGNOSIS — R0789 Other chest pain: Secondary | ICD-10-CM

## 2018-12-10 DIAGNOSIS — I340 Nonrheumatic mitral (valve) insufficiency: Secondary | ICD-10-CM

## 2018-12-10 DIAGNOSIS — K219 Gastro-esophageal reflux disease without esophagitis: Secondary | ICD-10-CM

## 2018-12-10 DIAGNOSIS — R079 Chest pain, unspecified: Secondary | ICD-10-CM

## 2018-12-10 DIAGNOSIS — E785 Hyperlipidemia, unspecified: Secondary | ICD-10-CM | POA: Diagnosis not present

## 2018-12-10 DIAGNOSIS — I2510 Atherosclerotic heart disease of native coronary artery without angina pectoris: Secondary | ICD-10-CM | POA: Diagnosis not present

## 2018-12-10 HISTORY — DX: Chronic obstructive pulmonary disease, unspecified: J44.9

## 2018-12-10 HISTORY — DX: Essential (primary) hypertension: I10

## 2018-12-10 LAB — HEPARIN LEVEL (UNFRACTIONATED): Heparin Unfractionated: 0.24 IU/mL — ABNORMAL LOW (ref 0.30–0.70)

## 2018-12-10 LAB — TROPONIN I
Troponin I: <0.03 ng/mL (ref <0.03)
Troponin I: <0.03 ng/mL (ref <0.03)

## 2018-12-10 LAB — LIPID PANEL
Cholesterol: 154 mg/dL (ref 0–200)
HDL: 87 mg/dL (ref >40)
LDL Cholesterol: 61 mg/dL (ref 0–99)
Total CHOL/HDL Ratio: 1.8 RATIO
Triglycerides: 30 mg/dL (ref <150)
VLDL: 6 mg/dL (ref 0–40)

## 2018-12-10 MED ORDER — ASPIRIN 81 MG PO TABS: 81.0000 mg | ORAL_TABLET | Freq: Every day | ORAL | Status: DC

## 2018-12-10 MED ORDER — EZETIMIBE 10 MG PO TABS: 10.0000 mg | ORAL_TABLET | Freq: Every day | ORAL | Status: DC

## 2018-12-10 MED ORDER — CLOPIDOGREL BISULFATE 75 MG PO TABS
75.0000 mg | ORAL_TABLET | Freq: Every day | ORAL | Status: DC
  Administered 2018-12-10: 75 mg via ORAL
  Filled 2018-12-10: qty 1

## 2018-12-10 MED ORDER — LISINOPRIL 2.5 MG PO TABS
5.0000 mg | ORAL_TABLET | Freq: Every day | ORAL | Status: DC
  Administered 2018-12-10: 5 mg via ORAL
  Filled 2018-12-10: qty 2

## 2018-12-10 MED ORDER — NITROGLYCERIN 0.4 MG SL SUBL: SUBLINGUAL_TABLET | SUBLINGUAL | 3 refills | Status: DC

## 2018-12-10 MED ORDER — CARVEDILOL 12.5 MG PO TABS: 12.5000 mg | ORAL_TABLET | Freq: Two times a day (BID) | ORAL | Status: DC

## 2018-12-10 MED ORDER — ALLOPURINOL 300 MG PO TABS
300.0000 mg | ORAL_TABLET | Freq: Every day | ORAL | Status: DC
  Administered 2018-12-10: 300 mg via ORAL
  Filled 2018-12-10: qty 1

## 2018-12-10 MED ORDER — CARVEDILOL 12.5 MG PO TABS: ORAL_TABLET | ORAL | 3 refills | Status: DC

## 2018-12-10 MED ORDER — PANTOPRAZOLE SODIUM 40 MG PO TBEC: 40.0000 mg | DELAYED_RELEASE_TABLET | Freq: Every day | ORAL | 4 refills | Status: DC | PRN

## 2018-12-10 MED ORDER — ASPIRIN 81 MG PO CHEW: 81.0000 mg | CHEWABLE_TABLET | Freq: Every day | ORAL | Status: DC

## 2018-12-10 MED ORDER — SIMVASTATIN 20 MG PO TABS: 40.0000 mg | ORAL_TABLET | Freq: Every day | ORAL | Status: DC

## 2018-12-10 NOTE — ED Notes (Signed)
ED secretary paging Hospitalist to this RN in regards to orders for serial troponins

## 2018-12-10 NOTE — ED Notes (Signed)
Pt daughter [name] [phone]

## 2018-12-10 NOTE — Discharge Instructions (Signed)
You have a Stress Test scheduled at [facility]. Your doctor has ordered this test to check the blood flow in your heart arteries.  Please arrive 15 minutes early for paperwork. The whole test will take several hours. You may want to bring reading material to remain occupied while undergoing different parts of the test.  Instructions:  No food/drink after midnight the night before.  It is OK to take your morning meds with a sip of water EXCEPT for those types of medicines listed below or otherwise instructed.  No caffeine/decaf products 24 hours before, including medicines such as Excedrin or Goody Powders. Call if there are any questions.   Wear comfortable clothes and shoes.   Special Medication Instructions:  Beta blockers such as metoprolol (Lopressor/Toprol XL), atenolol (Tenormin), carvedilol (Coreg), nebivolol (Bystolic), bisoprolol (Zebeta), propranolol (Inderal) should not be taken for 24 hours before the test.  Calcium channel blockers such as diltiazem (Cardizem) or verapmil (Calan) should not be taken for 24 hours before the test.  Remove nitroglycerin patches and do not take nitrate preparations such as Imdur/isosorbide the day of your test.  No Persantine/Theophylline or Aggrenox medicines should be used within 24 hours of the test.    What To Expect: When you arrive in the lab, the technician will inject a small amount of radioactive tracer into your arm through an IV while you are resting quietly. This helps [place] to form pictures of your heart. You will likely only feel a sting from the IV. After a waiting period, resting pictures will be obtained under a big camera. These are the "before" pictures.  Next, you will be prepped for the stress portion of the test. This may include either walking on a treadmill or receiving a medicine that helps to dilate blood vessels in your heart to simulate the effect of exercise on your heart. If you are walking  on a treadmill, you will walk at different paces to try to get your heart rate to a goal number that is based on your age. If your doctor has chosen the pharmacologic test, then you will receive a medicine through your IV that may cause temporary nausea, flushing, shortness of breath and sometimes chest discomfort or vomiting. This is typically short-lived and usually resolves quickly. If you experience symptoms, that does not automatically mean the test is abnormal. Some patients do not experience any symptoms at all. Your blood pressure and heart rate will be monitored, and we will be watching your EKG on a computer screen for any changes. During this portion of the test, the radiologist will inject another small amount of radioactive tracer into your IV. After a waiting period, you will undergo a second set of pictures. These are the "after" pictures.  The doctor reading the test will compare the before-and-after images to look for evidence of heart blockages or heart weakness. The test usually takes 1 day to complete, but in certain instances (for example, if a patient is over a certain weight limit), the test may be done over the span of 2 days.     Nonspecific Chest Pain, Adult Chest pain can be caused by many different conditions. It can be caused by a condition that is life-threatening and requires treatment right away. It can also be caused by something that is not life-threatening. If you have chest pain, it can be hard to know the difference, so it is important to get help right away to make sure that you  do not have a serious condition. Some life-threatening causes of chest pain include:  Heart attack.  A tear in the body's main blood vessel (aortic dissection).  Inflammation around your heart (pericarditis).  A problem in the lungs, such as a blood clot (pulmonary embolism) or a collapsed lung (pneumothorax). Some non life-threatening causes of chest pain  include:  Heartburn.  Anxiety or stress.  Damage to the bones, muscles, and cartilage that make up your chest wall.  Pneumonia or bronchitis.  Shingles infection (varicella-zoster virus). Chest pain can feel like:  Pain or discomfort on the surface of your chest or deep in your chest.  Crushing, pressure, aching, or squeezing pain.  Burning or tingling.  Dull or sharp pain that is worse when you move, cough, or take a deep breath.  Pain or discomfort that is also felt in your back, neck, jaw, shoulder, or arm, or pain that spreads to any of these areas. Your chest pain may come and go. It may also be constant. Your health care provider will do lab tests and other studies to find the cause of your pain. Treatment will depend on the cause of your chest pain. Follow these instructions at home: Medicines  Take over-the-counter and prescription medicines only as told by your health care provider.  If you were prescribed an antibiotic, take it as told by your health care provider. Do not stop taking the antibiotic even if you start to feel better. Lifestyle   Rest as directed by your health care provider.  Do not use any products that contain nicotine or tobacco, such as cigarettes and e-cigarettes. If you need help quitting, ask your health care provider.  Do not drink alcohol.  Make healthy lifestyle choices as recommended. These may include: ? Getting regular exercise. Ask your health care provider to suggest some activities that are safe for you. ? Eating a heart-healthy diet. This includes plenty of fresh fruits and vegetables, whole grains, low-fat (lean) protein, and low-fat dairy products. A dietitian can help you find healthy eating options. ? Maintaining a healthy weight. ? Managing any other health conditions you have, such as high blood pressure (hypertension) or diabetes. ? Reducing stress, such as with yoga or relaxation techniques. General instructions  Pay  attention to any changes in your symptoms. Tell your health care provider about them or any new symptoms.  Avoid any activities that cause chest pain.  Keep all follow-up visits as told by your health care provider. This is important. This includes visits for any further testing if your chest pain does not go away. Contact a health care provider if:  Your chest pain does not go away.  You feel depressed.  You have a fever. Get help right away if:  Your chest pain gets worse.  You have a cough that gets worse, or you cough up blood.  You have severe pain in your abdomen.  You faint.  You have sudden, unexplained chest discomfort.  You have sudden, unexplained discomfort in your arms, back, neck, or jaw.  You have shortness of breath at any time.  You suddenly start to sweat, or your skin gets clammy.  You feel nausea or you vomit.  You suddenly feel lightheaded or dizzy.  You have severe weakness, or unexplained weakness or fatigue.  Your heart begins to beat quickly, or it feels like it is skipping beats. These symptoms may represent a serious problem that is an emergency. Do not wait to see if the symptoms  will go away. Get medical help right away. Call your local emergency services (911 in the [place] Do not drive yourself to the hospital. Summary  Chest pain can be caused by a condition that is serious and requires urgent treatment. It may also be caused by something that is not life-threatening.  If you have chest pain, it is very important to see your health care provider. Your health care provider may do lab tests and other studies to find the cause of your pain.  Follow your health care provider's instructions on taking medicines, making lifestyle changes, and getting emergency treatment if symptoms become worse.  Keep all follow-up visits as told by your health care provider. This includes visits for any further testing if your chest pain does not go away. This  information is not intended to replace advice given to you by your health care provider. Make sure you discuss any questions you have with your health care provider. Document Released: 03/16/2005 Document Revised: 12/07/2017 Document Reviewed: 12/07/2017 Elsevier Interactive Patient Education  2019 [employer].

## 2018-12-10 NOTE — ED Notes (Signed)
Heart healthy lunch tray ordered 

## 2018-12-10 NOTE — Progress Notes (Signed)
ANTICOAGULATION CONSULT NOTE  Pharmacy Consult for Heparin Indication: chest pain/ACS  Patient Measurements: Height: 5\' 8"  (172.7 cm) Weight: 140 lb (63.5 kg) IBW/kg (Calculated) : 68.4  Vital Signs: BP: 127/81 (06/22 0406) Pulse Rate: 60 (06/22 0406)  Labs: Recent Labs    12/09/18 2004 12/10/18 0358 12/10/18 0850  HGB 13.5  --   --   HCT 39.8  --   --   PLT 140*  --   --   HEPARINUNFRC  --   --  0.24*  CREATININE 0.96  --   --   TROPONINI <0.03 <0.03  --     Estimated Creatinine Clearance: 63.4 mL/min (by C-G formula based on SCr of 0.96 mg/dL).  Assessment: 73 y.o. male presented to the ED with CP. He continues on IV heparin. Initial heparin level is subtherapeutic at 0.24. Baseline Hgb is WNL and platelets are slightly low. No bleeding noted.  Goal of Therapy:  Heparin level 0.3-0.7 units/ml Monitor platelets by anticoagulation protocol: Yes   Plan:  Increase heparin gtt to 900 units/hr Check an 8 hr heparin level Daily heparin level and CBC  Jesus Jordan, Jesus Jordan 12/10/2018,9:57 AM

## 2018-12-10 NOTE — Discharge Planning (Signed)
Physician Discharge Summary  Staci Carver IEP:[id number] DOB: 1946/06/16 DOA: 12/09/2018  PCP: Eulas Post, MD  Admit date: 12/09/2018 Discharge date: 12/10/2018  Admitted From: Home Discharge disposition: Home   Recommendations for Outpatient Follow-Up:   1. Outpatient stress test scheduled by cardiology. 2. Instructed to call cardiologist if taking Imdur causes problems with dizziness (had stopped it due to this).   Discharge Diagnosis:   Principal Problem:   Chest pain Active Problems:   Hyperlipidemia with target LDL less than 70   CAD in native artery   GERD (gastroesophageal reflux disease)   COPD (chronic obstructive pulmonary disease) (HCC)   Essential hypertension   Discharge Condition: Improved.  Diet recommendation: Low sodium, heart healthy.    Code status: Full.   History of Present Illness:   Jesus Jordan is an 73 y.o. male with a PMH of CAD status post DES to circumflex and PTCA of multiple sites March/2010, hypertension, hyperlipidemia who was admitted 12/09/2018 for evaluation of substernal chest pain associated with pressure and anxiety.  In the ED, initial troponin was negative and chest x-Myler showed findings consistent with COPD.  EKG was normal.  Hospital Course by Problem:   Principal Problem:   Chest pain in a patient with known CAD and native artery Troponin negative x2.  Chest x-Duerson personally reviewed and shows hyperexpanded lungs but is otherwise clear.  Given significant cardiac history, we requested cardiology consultation and an outpatient stress test was scheduled after ACS ruled out with negative enzymes.  Continue ASA/Plavix given h/o DES.  Resume Coreg, Imdur.     Active Problems:   Hypertension Resume lisinopril and Coreg.    Hyperlipidemia with target LDL less than 70 Resume Zocor. LDL 61.    GERD (gastroesophageal reflux disease) Currently not taking any PPI therapy. Script written for Protonix.   Medical  Consultants:    Cardiology   Discharge Exam:   Vitals:   12/10/18 1204 12/10/18 1400  BP:  114/71  Pulse: 86 64  Resp: 17 14  Temp:    SpO2: 93% 100%   Vitals:   12/10/18 1015 12/10/18 1202 12/10/18 1204 12/10/18 1400  BP:  114/75  114/71  Pulse: 63  86 64  Resp: (!) 21  17 14   Temp:      TempSrc:      SpO2: 99%  93% 100%  Weight:      Height:        General exam: Appears calm and comfortable.  Respiratory system: Clear to auscultation. Respiratory effort normal. Cardiovascular system: S1 & S2 heard, RRR. No JVD,  rubs, gallops or clicks. No murmurs. Gastrointestinal system: Abdomen is nondistended, soft and nontender. No organomegaly or masses felt. Normal bowel sounds heard. Central nervous system: Alert and oriented. No focal neurological deficits. Extremities: No clubbing,  or cyanosis. No edema. Skin: No rashes, lesions or ulcers. Psychiatry: Judgement and insight appear normal. Mood & affect appropriate.    The results of significant diagnostics from this hospitalization (including imaging, microbiology, ancillary and laboratory) are listed below for reference.     Procedures and Diagnostic Studies:   Dg Chest 2 View  Result Date: 12/09/2018 CLINICAL DATA:  Chest pain EXAM: CHEST - 2 VIEW COMPARISON:  03/21/2016 FINDINGS: The lungs are hyperinflated with diffuse interstitial prominence. No focal airspace consolidation or pulmonary edema. No pleural effusion or pneumothorax. Normal cardiomediastinal contours. IMPRESSION: COPD without acute airspace disease. Electronically Signed   By: Ulyses Jarred M.D.   On: 12/09/2018 20:56  Labs:   Basic Metabolic Panel: Recent Labs  Lab 12/09/18 2004  NA 134*  K 3.9  CL 103  CO2 22  GLUCOSE 155*  BUN 16  CREATININE 0.96  CALCIUM 9.2   GFR Estimated Creatinine Clearance: 63.4 mL/min (by C-G formula based on SCr of 0.96 mg/dL). Liver Function Tests: Recent Labs  Lab 12/09/18 2004  AST 30  ALT 19   ALKPHOS 66  BILITOT 1.1  PROT 6.8  ALBUMIN 4.0    CBC: Recent Labs  Lab 12/09/18 2004  WBC 6.3  NEUTROABS 3.5  HGB 13.5  HCT 39.8  MCV 100.5*  PLT 140*   Cardiac Enzymes: Recent Labs  Lab 12/09/18 2004 12/10/18 0358 12/10/18 1230  TROPONINI <0.03 <0.03 <0.03   Lipid Profile Recent Labs    12/10/18 1230  CHOL 154  HDL 87  LDLCALC 61  TRIG 30  CHOLHDL 1.8   Microbiology Recent Results (from the past 240 hour(s))  SARS Coronavirus 2 (CEPHEID - Performed in [facility] hospital lab), Hosp Order     Status: None   Collection Time: 12/09/18 10:37 PM   Specimen: Nasopharyngeal Swab  Result Value Ref Range Status   SARS Coronavirus 2 NEGATIVE NEGATIVE Final    Comment: (NOTE) If result is NEGATIVE SARS-CoV-2 target nucleic acids are NOT DETECTED. The SARS-CoV-2 RNA is generally detectable in upper and lower  respiratory specimens during the acute phase of infection. The lowest  concentration of SARS-CoV-2 viral copies this assay can detect is 250  copies / mL. A negative result does not preclude SARS-CoV-2 infection  and should not be used as the sole basis for treatment or other  patient management decisions.  A negative result may occur with  improper specimen collection / handling, submission of specimen other  than nasopharyngeal swab, presence of viral mutation(s) within the  areas targeted by this assay, and inadequate number of viral copies  (<250 copies / mL). A negative result must be combined with clinical  observations, patient history, and epidemiological information. If result is POSITIVE SARS-CoV-2 target nucleic acids are DETECTED. The SARS-CoV-2 RNA is generally detectable in upper and lower  respiratory specimens dur ing the acute phase of infection.  Positive  results are indicative of active infection with SARS-CoV-2.  Clinical  correlation with patient history and other diagnostic information is  necessary to determine patient infection  status.  Positive results do  not rule out bacterial infection or co-infection with other viruses. If result is PRESUMPTIVE POSTIVE SARS-CoV-2 nucleic acids MAY BE PRESENT.   A presumptive positive result was obtained on the submitted specimen  and confirmed on repeat testing.  While 2019 novel coronavirus  (SARS-CoV-2) nucleic acids may be present in the submitted sample  additional confirmatory testing may be necessary for epidemiological  and / or clinical management purposes  to differentiate between  SARS-CoV-2 and other Sarbecovirus currently known to infect humans.  If clinically indicated additional testing with an alternate test  methodology [phone]) is advised. The SARS-CoV-2 RNA is generally  detectable in upper and lower respiratory sp ecimens during the acute  phase of infection. The expected result is Negative. Fact Sheet for Patients:  StrictlyIdeas.no Fact Sheet for Healthcare Providers: BankingDealers.co.za This test is not yet approved or cleared by the [place] FDA and has been authorized for detection and/or diagnosis of SARS-CoV-2 by FDA under an Emergency Use Authorization (EUA).  This EUA will remain in effect (meaning this test can be used) for the duration of  the COVID-19 declaration under Section 564(b)(1) of the Act, 21 U.S.C. section 360bbb-3(b)(1), unless the authorization is terminated or revoked sooner. Performed at [facility], [place] [address], [place], [place] [address]      Discharge Instructions:   Discharge Instructions    Call MD for:  extreme fatigue   Complete by: As directed    Call MD for:  persistant dizziness or light-headedness   Complete by: As directed    Call MD for:  persistant nausea and vomiting   Complete by: As directed    Call MD for:  severe uncontrolled pain   Complete by: As directed    Diet - low sodium heart healthy   Complete by: As directed    Increase  activity slowly   Complete by: As directed      Allergies as of 12/10/2018      Reactions   Imdur [isosorbide Nitrate] Other (See Comments)   Caused vertigo      Medication List    TAKE these medications   allopurinol 300 MG tablet Commonly known as: ZYLOPRIM Take 1 tablet by mouth once daily   aspirin 81 MG tablet Take 81 mg by mouth at bedtime.   calcium carbonate 500 MG chewable tablet Commonly known as: TUMS - dosed in mg elemental calcium Chew 1-2 tablets by mouth as needed for indigestion or heartburn.   carvedilol 12.5 MG tablet Commonly known as: COREG Take 12.5 mg by mouth in the morning and 12.5 mg in the evening   clopidogrel 75 MG tablet Commonly known as: PLAVIX Take 1 tablet (75 mg total) by mouth daily.   colchicine 0.6 MG tablet Take 0.6 mg by mouth daily as needed (as directed- for gout flares).   ezetimibe 10 MG tablet Commonly known as: ZETIA Take 1 tablet (10 mg total) by mouth at bedtime.   fluticasone 0.05 % cream Commonly known as: CUTIVATE Apply 1 application topically daily as needed (to affected areas of face).   isosorbide mononitrate 30 MG 24 hr tablet Commonly known as: IMDUR TAKE ONE TABLET BY MOUTH ONCE DAILY   lisinopril 5 MG tablet Commonly known as: ZESTRIL Take 1 tablet by mouth once daily   nitroGLYCERIN 0.4 MG SL tablet Commonly known as: NITROSTAT DISSOLVE ONE TABLET UNDER THE TONGUE EVERY 5 MINUTES AS NEEDED FOR CHEST PAIN.  DO NOT EXCEED A TOTAL OF 3 DOSES IN 15 MINUTES What changed: See the new instructions.   One-A-Day Mens 50+ Advantage Tabs Take 1 tablet by mouth daily.   pantoprazole 40 MG tablet Commonly known as: PROTONIX Take 1 tablet (40 mg total) by mouth daily as needed.   simvastatin 40 MG tablet Commonly known as: ZOCOR Take 1 tablet (40 mg total) by mouth at bedtime.   tetrahydrozoline-zinc 0.05-0.25 % ophthalmic solution Commonly known as: VISINE-AC Place 2 drops into both eyes 3 (three) times  daily as needed (for itching ot redness).      Follow-up Information    Burchette, Alinda Sierras, MD. Schedule an appointment as soon as possible for a visit.   Specialty: Family Medicine Why: As needed. Contact information: [place] [address] [phone]        Troy Sine, MD .   Specialty: Cardiology Contact information: [address] [place] [address] [phone]            Time coordinating discharge: 30 minutes.  SignedMargreta Journey Rama  Pager [phone] [facility] 12/10/2018, 3:00  PM

## 2018-12-10 NOTE — ED Notes (Signed)
Patient already moved to ED holding area prior to orders for serial troponins being placed. Bri, RN made aware of orders for trop due at this time

## 2018-12-10 NOTE — Progress Notes (Signed)
Progress Note    Jesus Jordan  IOM:[id number] DOB: 11-27-1945  DOA: 12/09/2018 PCP: Eulas Post, MD    Brief Narrative:   Chief complaint: Follow-up chest pain.  Medical records reviewed and are as summarized below:  Jesus Jordan is an 73 y.o. male with a PMH of CAD status post DES to circumflex and PTCA of multiple sites March/2010, hypertension, hyperlipidemia who was admitted 12/09/2018 for evaluation of substernal chest pain associated with pressure and anxiety.  In the ED, initial troponin was negative and chest x-Re[place]ers showed findings consistent with COPD.  EKG was normal.  Assessment/Plan:   Principal Problem:   Chest pain in a patient with known CAD and native artery Troponin negative x2.  Chest x-Rhudy personally reviewed and shows hyperexpanded lungs but is otherwise clear.  Given significant cardiac history, will obtain cardiology consultation.  Currently on heparin. Resume ASA/Plavix given h/o DES.  Resume Coreg.   Active Problems:   Hypertension Resume lisinopril and Coreg.    Hyperlipidemia with target LDL less than 70 Resume Zocor. Check FLP to ensure he is under good control.    GERD (gastroesophageal reflux disease) Currently not taking any PPI therapy.  Body mass index is 21.29 kg/m.   Family Communication/Anticipated D/C date and plan/Code Status   DVT prophylaxis: Heparin ordered. Code Status: Full Code.  Family Communication: Declines my offer to call. Disposition Plan: Home this afternoon if no further cardiac work-up planned.   Medical Consultants:    Cardiology   Anti-Infectives:    None  Subjective:   Mr. Mahaffy says he feels a bit better and currently denies any chest pain, shortness of breath, nausea, vomiting, or diaphoresis.  Objective:    Vitals:   12/10/18 0100 12/10/18 0130 12/10/18 0300 12/10/18 0406  BP: 119/74 113/68 118/72 127/81  Pulse: 63 62 60 60  Resp: 15 14 14  (!) 24  Temp:      TempSrc:      SpO2: 98% 96%  96% 98%  Weight:      Height:        Intake/Output Summary (Last 24 hours) at 12/10/2018 0931 Last data filed at 12/10/2018 0844 Gross per 24 hour  Intake 109.66 ml  Output -  Net 109.66 ml   Filed Weights   12/09/18 2010  Weight: 63.5 kg    Exam: General: No acute distress. Cardiovascular: Heart sounds show a regular rate, and rhythm. No gallops or rubs. No murmurs. No JVD. Lungs: Clear to auscultation bilaterally with good air movement. No rales, rhonchi or wheezes. Abdomen: Soft, nontender, nondistended with normal active bowel sounds. No masses. No hepatosplenomegaly. Neurological: Alert and oriented 3. Moves all extremities 4 with equal strength. Cranial nerves II through XII grossly intact. Skin: Warm and dry. No rashes or lesions. Extremities: No clubbing or cyanosis. No edema. Pedal pulses 2+. Psychiatric: Mood and affect are normal. Insight and judgment are normal.   Data Reviewed:   I have personally reviewed following labs and imaging studies:  Labs: Labs show the following:   Basic Metabolic Panel: Recent Labs  Lab 12/09/18 2004  NA 134*  K 3.9  CL 103  CO2 22  GLUCOSE 155*  BUN 16  CREATININE 0.96  CALCIUM 9.2   GFR Estimated Creatinine Clearance: 63.4 mL/min (by C-G formula based on SCr of 0.96 mg/dL). Liver Function Tests: Recent Labs  Lab 12/09/18 2004  AST 30  ALT 19  ALKPHOS 66  BILITOT 1.1  PROT 6.8  ALBUMIN 4.0  CBC: Recent Labs  Lab 12/09/18 2004  WBC 6.3  NEUTROABS 3.5  HGB 13.5  HCT 39.8  MCV 100.5*  PLT 140*   Cardiac Enzymes: Recent Labs  Lab 12/09/18 2004 12/10/18 0358  TROPONINI <0.03 <0.03    Microbiology Recent Results (from the past 240 hour(s))  SARS Coronavirus 2 (CEPHEID - Performed in [place] hospital lab), Hosp Order     Status: None   Collection Time: 12/09/18 10:37 PM   Specimen: Nasopharyngeal Swab  Result Value Ref Range Status   SARS Coronavirus 2 NEGATIVE NEGATIVE Final    Comment:  (NOTE) If result is NEGATIVE SARS-CoV-2 target nucleic acids are NOT DETECTED. The SARS-CoV-2 RNA is generally detectable in upper and lower  respiratory specimens during the acute phase of infection. The lowest  concentration of SARS-CoV-2 viral copies this assay can detect is 250  copies / mL. A negative result does not preclude SARS-CoV-2 infection  and should not be used as the sole basis for treatment or other  patient management decisions.  A negative result may occur with  improper specimen collection / handling, submission of specimen other  than nasopharyngeal swab, presence of viral mutation(s) within the  areas targeted by this assay, and inadequate number of viral copies  (<250 copies / mL). A negative result must be combined with clinical  observations, patient history, and epidemiological information. If result is POSITIVE SARS-CoV-2 target nucleic acids are DETECTED. The SARS-CoV-2 RNA is generally detectable in upper and lower  respiratory specimens dur ing the acute phase of infection.  Positive  results are indicative of active infection with SARS-CoV-2.  Clinical  correlation with patient history and other diagnostic information is  necessary to determine patient infection status.  Positive results do  not rule out bacterial infection or co-infection with other viruses. If result is PRESUMPTIVE POSTIVE SARS-CoV-2 nucleic acids MAY BE PRESENT.   A presumptive positive result was obtained on the submitted specimen  and confirmed on repeat testing.  While 2019 novel coronavirus  (SARS-CoV-2) nucleic acids may be present in the submitted sample  additional confirmatory testing may be necessary for epidemiological  and / or clinical management purposes  to differentiate between  SARS-CoV-2 and other Sarbecovirus currently known to infect humans.  If clinically indicated additional testing with an alternate test  methodology [phone]) is advised. The SARS-CoV-2 RNA is  generally  detectable in upper and lower respiratory sp ecimens during the acute  phase of infection. The expected result is Negative. Fact Sheet for Patients:  StrictlyIdeas.no Fact Sheet for Healthcare Providers: BankingDealers.co.za This test is not yet approved or cleared by the [place] FDA and has been authorized for detection and/or diagnosis of SARS-CoV-2 by FDA under an Emergency Use Authorization (EUA).  This EUA will remain in effect (meaning this test can be used) for the duration of the COVID-19 declaration under Section 564(b)(1) of the Act, 21 U.S.C. section 360bbb-3(b)(1), unless the authorization is terminated or revoked sooner. Performed at [facility], [place] [address], [place], [place] [address]     Procedures and diagnostic studies:  Dg Chest 2 View  Result Date: 12/09/2018 CLINICAL DATA:  Chest pain EXAM: CHEST - 2 VIEW COMPARISON:  03/21/2016 FINDINGS: The lungs are hyperinflated with diffuse interstitial prominence. No focal airspace consolidation or pulmonary edema. No pleural effusion or pneumothorax. Normal cardiomediastinal contours. IMPRESSION: COPD without acute airspace disease. Electronically Signed   By: Ulyses Jarred M.D.   On: 12/09/2018 20:56  Medications:   . zoster vaccine live (PF)  0.65 mL Subcutaneous Once   Continuous Infusions: . heparin 750 Units/hr (12/09/18 2326)     LOS: 0 days   Jacquelynn Cree  [facility] Pager [phone].   *Please refer to [email or web address], password [email or web address] to get updated schedule on who will round on this patient, as hospitalists switch teams weekly. If 7PM-7AM, please contact night-coverage at [email or web address], password [email or web address] for any overnight needs.  12/10/2018, 9:31 AM

## 2018-12-10 NOTE — ED Notes (Addendum)
Dr. Kennon Holter with [facility] contacted this RN regarding troponin orders and advised they would place orders for serial trops.

## 2018-12-10 NOTE — ED Notes (Signed)
Tele   Breakfast ordered  

## 2018-12-10 NOTE — Consult Note (Addendum)
Cardiology Consultation:   Patient ID: Jesus Jordan; [id number]; 10/21/1945   Admit date: 12/09/2018 Date of Consult: 12/10/2018  Primary Care Provider: Eulas Post, MD Primary Cardiologist: Shelva Majestic, MD Primary Electrophysiologist:  None   Patient Profile:   Jesus Jordan is a 73 y.o. male with a PMH of VF arrest in 2010, CAD s/p PCI/DES to LCx and PTCA to multiple sites (2010), HTN, HLD, Gout, tobacco abuse who is being seen today for the evaluation of chest pain at the request of Dr. Rockne Menghini.  History of Present Illness:   Jesus Jordan was in his usual state of health until 12/09/2018 when he experienced intermittent substernal chest pain/pressure reminiscent of his prior MI though not as severe. He denied worsening of chest pain with exertion but felt generally weak when walking. He reported radiation of pain to bilateral shoulders and associated lightheadedness. No associated SOB, diaphoresis, nausea, vomiting, or syncope. Given similarities to prior MI he presented to the ED for further evaluation.   He was last evaluated by cardiology at an outpatient visit with Dr. Claiborne Billings 04/2018, at which time he reported occasional dizziness with position changes, for which he was recommended to reduce his carvedilol. His last ischemic evaluation was a NST in 2015 which showed fixed inferior defect and no reversible ischemia, with EF estimated to be 64%. His last echocardiogram was in 2010 which revealed EF 50-55%, mild inferior/lateral wall hypokinesis, and mild-moderate MR.   At the time of this evaluation he is chest pain free. He reports chest pain resolved spontaneously last night. Prior to this episode, he denied chest pain complaints since his MI in 2010. No complaints of SOB, DOE, orthopnea, PND, LE edema, or syncope.    Hospital course: intermittently tachypneic, otherwise VSS. Labs notable for Na 134, K 3.9, Cr 0.96, Hgb 13.5, PLT 140, trop negative x2, COVID-19 negative. EKG with sinus rhythm  with borderline 1st degree AV block, with no STE/D, no TWI. CXR with evidence of COPD but no acute findings. He was started on a heparin gtt given concerns for ACS. Admitted to medicine. Cardiology asked to evaluate for chest pain.   Past Medical History:  Diagnosis Date   CAD 01/29/2009   Qualifier: Diagnosis of  By: [name] MD, [name]     COPD (chronic obstructive pulmonary disease) ([place]) 12/10/2018   GOUT 01/29/2009   Qualifier: Diagnosis of  By: [name] MD, [name]     Gout    Heart disease    cardiologist Dr Claiborne Billings   History of MI (myocardial infarction) 09/13/2006   large inferolateral MI secondary to total occlusion of circumflex   History of nuclear stress test 02/23/2012   exercise myoview; area of scar in inferolateral wall at mid-basal region   Hyperlipidemia    INGUINAL HERNIA 05/25/2009   Qualifier: Diagnosis of  By: [name] MD, [name]      Past Surgical History:  Procedure Laterality Date   CORONARY STENT PLACEMENT  09/12/2008   r/t MI; 3.5x77mm Xience DES to circumflex & PTCA at multiple sites in distal region of vessel; alos diffusely diseaase, narrowed, nondominant RCA   [place]  2011   left inguinal hernia   TRANSTHORACIC ECHOCARDIOGRAM  03/02/2009   OX=73-53%; LV systolic function borderline reduced with mild inferior wall hypokinesis; mild-mod MR; mild TR; AV mildly sclerotic; mild aortic root dilatation     Home Medications:  Prior to Admission medications   Medication Sig Start Date End Date Taking? Authorizing  Provider  allopurinol (ZYLOPRIM) 300 MG tablet Take 1 tablet by mouth once daily Patient taking differently: Take 300 mg by mouth daily.  10/29/18  Yes Burchette, Alinda Sierras, MD  aspirin 81 MG tablet Take 81 mg by mouth at bedtime.    Yes [provider]  calcium carbonate (TUMS - DOSED IN MG ELEMENTAL CALCIUM) 500 MG chewable tablet Chew 1-2 tablets by mouth as needed for indigestion or heartburn.   Yes [provider]  carvedilol (COREG) 12.5 MG tablet Take 1 tablet (12.5 mg total) by mouth every morning AND 0.5 tablets (6.25 mg total) every evening. Patient taking differently: Take 12.5 mg by mouth in the morning and 12.5 mg in the evening 04/20/18  Yes Troy Sine, MD  clopidogrel (PLAVIX) 75 MG tablet Take 1 tablet (75 mg total) by mouth daily. 08/17/18  Yes Troy Sine, MD  colchicine 0.6 MG tablet Take 0.6 mg by mouth daily as needed (as directed- for gout flares).    Yes [provider]  ezetimibe (ZETIA) 10 MG tablet Take 1 tablet (10 mg total) by mouth daily. Patient taking differently: Take 10 mg by mouth at bedtime.  08/17/18  Yes Troy Sine, MD  fluticasone (CUTIVATE) 0.05 % cream Apply 1 application topically daily as needed (to affected areas of face).  02/12/13  Yes [provider]  lisinopril (ZESTRIL) 5 MG tablet Take 1 tablet by mouth once daily Patient taking differently: Take 5 mg by mouth daily.  11/22/18  Yes Troy Sine, MD  Multiple Vitamins-Minerals (ONE-A-DAY MENS 50+ ADVANTAGE) TABS Take 1 tablet by mouth daily.   Yes [provider]  nitroGLYCERIN (NITROSTAT) 0.4 MG SL tablet DISSOLVE ONE TABLET UNDER THE TONGUE EVERY 5 MINUTES AS NEEDED FOR CHEST PAIN.  DO NOT EXCEED A TOTAL OF 3 DOSES IN 15 MINUTES Patient taking differently: Place 0.4 mg under the tongue every 5 (five) minutes x 3 doses as needed for chest pain.  07/02/18  Yes Troy Sine, MD  simvastatin (ZOCOR) 40 MG tablet Take 1 tablet (40 mg total) by mouth at bedtime. 08/17/18  Yes Troy Sine, MD  tetrahydrozoline-zinc (VISINE-AC) 0.05-0.25 % ophthalmic solution Place 2 drops into both eyes 3 (three) times daily as needed (for itching ot redness).   Yes [provider]  isosorbide mononitrate (IMDUR) 30 MG 24 hr tablet TAKE ONE TABLET BY MOUTH ONCE DAILY Patient not taking: Reported on 12/09/2018 08/01/17   Troy Sine, MD  pantoprazole (PROTONIX) 40 MG  tablet TAKE 1 TABLET BY MOUTH ONCE DAILY AS NEEDED Patient not taking: No sig reported 10/03/17   Troy Sine, MD    Inpatient Medications: Scheduled Meds:  allopurinol  300 mg Oral Daily   aspirin  81 mg Oral QHS   carvedilol  12.5 mg Oral BID WC   clopidogrel  75 mg Oral Daily   lisinopril  5 mg Oral Daily   simvastatin  40 mg Oral QHS   zoster vaccine live (PF)  0.65 mL Subcutaneous Once   Continuous Infusions:  heparin 900 Units/hr (12/10/18 1024)   PRN Meds: acetaminophen, ondansetron (ZOFRAN) IV  Allergies:    Allergies  Allergen Reactions   Imdur [Isosorbide Nitrate] Other (See Comments)    Caused vertigo    Social History:   Social History   Socioeconomic History   Marital status: Single    Spouse name: Not on file   Number of children: 3   Years of education:  Not on file   Highest education level: Not on file  Occupational History   Not on file  Social Needs   Financial resource strain: Not on file   Food insecurity    Worry: Not on file    Inability: Not on file   Transportation needs    Medical: Not on file    Non-medical: Not on file  Tobacco Use   Smoking status: Current Some Day Smoker    Years: 30.00    Types: Cigarettes   Smokeless tobacco: Never Used   Tobacco comment: skip days some; pack last 3 days   Substance and Sexual Activity   Alcohol use: Yes    Comment: occasional    Drug use: No   Sexual activity: Not on file  Lifestyle   Physical activity    Days per week: Not on file    Minutes per session: Not on file   Stress: Not on file  Relationships   Social connections    Talks on phone: Not on file    Gets together: Not on file    Attends religious service: Not on file    Active member of club or organization: Not on file    Attends meetings of clubs or organizations: Not on file    Relationship status: Not on file   Intimate partner violence    Fear of current or ex partner: Not on file     Emotionally abused: Not on file    Physically abused: Not on file    Forced sexual activity: Not on file  Other Topics Concern   Not on file  Social History Narrative   Not on file    Family History:    Family History  Problem Relation Age of Onset   Colon cancer Father    Hyperlipidemia Brother    Heart disease Brother 41       massive MI   Cancer Paternal Grandfather        colon     ROS:  Please see the history of present illness.   All other ROS reviewed and negative.     Physical Exam/Data:   Vitals:   12/10/18 0100 12/10/18 0130 12/10/18 0300 12/10/18 0406  BP: 119/74 113/68 118/72 127/81  Pulse: 63 62 60 60  Resp: 15 14 14  (!) 24  Temp:      TempSrc:      SpO2: 98% 96% 96% 98%  Weight:      Height:        Intake/Output Summary (Last 24 hours) at 12/10/2018 1139 Last data filed at 12/10/2018 1019 Gross per 24 hour  Intake 121.54 ml  Output --  Net 121.54 ml   Filed Weights   12/09/18 2010  Weight: 63.5 kg   Body mass index is 21.29 kg/m.   Physical exam per MD below  EKG:  The EKG was personally reviewed and demonstrates:   sinus rhythm with borderline 1st degree AV block, with no STE/D, no TWI. No significant change from previous  Relevant CV Studies: NST 2015: Overall Impression:  Low risk stress nuclear study with fixed inferior defect - no reversible ischemia. Excellent exercise tolerance.  LV Wall Motion:  NL LV Function; NL Wall Motion; EF 64%  Cardiac catheterization 2010: IMPRESSION:  1. Acute high-risk inferolateral myocardial infarction secondary to total proximal occlusion of a large dominant left circumflex coronary artery.  2. Multiple episodes of ventricular fibrillation requiring successful defibrillation and one episode of ventricular tachycardia.  3. Hypotension requiring dopamine.  4. Two-vessel coronary artery disease with total occlusion of the proximal dominant circumflex coronary artery and subtotal occlusion  diffusely of a small nondominant right coronary artery.  5. Difficult but successful percutaneous coronary prevention of the 100% occluded circumflex system with percutaneous transluminal coronary angioplasty and ultimate stenting and insertion of a 3.5- x 23-mm XIENCE V stent postdilated to 3.88 mm, utilization of Fetch thrombectomy with removal of significant thrombus burden, and diffuse percutaneous transluminal coronary angioplasty of diffusely diseased distal posterolateral artery vessel done with an Angiomax, 60 mg of prasugrel, double-bolus Integrilin, and anticoagulation.   Laboratory Data:  Chemistry Recent Labs  Lab 12/09/18 2004  NA 134*  K 3.9  CL 103  CO2 22  GLUCOSE 155*  BUN 16  CREATININE 0.96  CALCIUM 9.2  GFRNONAA >60  GFRAA >60  ANIONGAP 9    Recent Labs  Lab 12/09/18 2004  PROT 6.8  ALBUMIN 4.0  AST 30  ALT 19  ALKPHOS 66  BILITOT 1.1   Hematology Recent Labs  Lab 12/09/18 2004  WBC 6.3  RBC 3.96*  HGB 13.5  HCT 39.8  MCV 100.5*  MCH 34.1*  MCHC 33.9  RDW 13.8  PLT 140*   Cardiac Enzymes Recent Labs  Lab 12/09/18 2004 12/10/18 0358  TROPONINI <0.03 <0.03   No results for input(s): TROPIPOC in the last 168 hours.  BNPNo results for input(s): BNP, PROBNP in the last 168 hours.  DDimer No results for input(s): DDIMER in the last 168 hours.  Radiology/Studies:  Dg Chest 2 View  Result Date: 12/09/2018 CLINICAL DATA:  Chest pain EXAM: CHEST - 2 VIEW COMPARISON:  03/21/2016 FINDINGS: The lungs are hyperinflated with diffuse interstitial prominence. No focal airspace consolidation or pulmonary edema. No pleural effusion or pneumothorax. Normal cardiomediastinal contours. IMPRESSION: COPD without acute airspace disease. Electronically Signed   By: Ulyses Jarred M.D.   On: 12/09/2018 20:56    Assessment and Plan:   1. Chest pain in patient with multivessel CAD s/p PCI/DES to LCx: Patient presented with chest pain reminiscent of prior MI  though not as severe. Trops negative x3. EKG non-ischemic. Last ischemic evaluation was a NST in 2015 which revealed inferior scaring but no reversible ischemia; EF 64%. BP and cholesterol appear to be well controlled, though he does still have some tobacco use.  - Anticipate an outpatient ischemic evaluation with a NST - Continue aspirin and plavix - Continue statin - Continue carvedilol - Will renew SL nitro prescription - administration instructions reviewed.   2. HTN: BP well controlled - Continue home lisinopril and carvedilol  3. HLD: LDL 61 - Continue statin and zetia  4. Mitral regurgitation: noted to be mild-moderate on last echo in 2013 - Will arrange an outpatient echo for routine monitoring.   5. Tobacco abuse: still with occasional tobacco use - Continue to encourage cessation   For questions or updates, please contact [place] Please consult [email or web address] for contact info under Cardiology/STEMI.   Signed, Abigail Butts, PA-C  12/10/2018 11:39 AM [phone]  I have examined the patient and reviewed assessment and plan and discussed with patient.  Agree with above as stated.    Patient had been active physically prior to admission.  No problems walking up stairs. Currently pain free..  OK for discharge.  Will schedule outpatient stress test and echo to eval for ischemia and degree of his mitral regurgitation.  He is comfortable with this plan.  He thinks he was  just very anxious and this was the cause of his sx.   Larae Grooms

## 2018-12-12 ENCOUNTER — Encounter: Payer: Self-pay | Admitting: Family Medicine

## 2018-12-12 ENCOUNTER — Other Ambulatory Visit: Payer: Self-pay

## 2018-12-12 ENCOUNTER — Ambulatory Visit (INDEPENDENT_AMBULATORY_CARE_PROVIDER_SITE_OTHER): Payer: Medicare Other | Admitting: Family Medicine

## 2018-12-12 DIAGNOSIS — I2510 Atherosclerotic heart disease of native coronary artery without angina pectoris: Secondary | ICD-10-CM | POA: Diagnosis not present

## 2018-12-12 DIAGNOSIS — I10 Essential (primary) hypertension: Secondary | ICD-10-CM | POA: Diagnosis not present

## 2018-12-12 DIAGNOSIS — E785 Hyperlipidemia, unspecified: Secondary | ICD-10-CM

## 2018-12-12 DIAGNOSIS — K219 Gastro-esophageal reflux disease without esophagitis: Secondary | ICD-10-CM | POA: Diagnosis not present

## 2018-12-12 DIAGNOSIS — R0789 Other chest pain: Secondary | ICD-10-CM | POA: Diagnosis not present
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
## 2018-12-14 ENCOUNTER — Telehealth (HOSPITAL_COMMUNITY): Payer: Self-pay | Admitting: *Deleted

## 2018-12-14 NOTE — Telephone Encounter (Signed)
Patient given detailed instructions per Myocardial Perfusion Study Information Sheet for the test on 12/18/18. Patient notified to arrive 15 minutes early and that it is imperative to arrive on time for appointment to keep from having the test rescheduled.  If you need to cancel or reschedule your appointment, please call the office within 24 hours of your appointment. . Patient verbalized understanding. Jesus Jordan Jacqueline    

## 2018-12-17 ENCOUNTER — Other Ambulatory Visit (HOSPITAL_COMMUNITY): Payer: Medicare Other

## 2018-12-17 ENCOUNTER — Telehealth (HOSPITAL_COMMUNITY): Payer: Self-pay

## 2018-12-17 NOTE — Telephone Encounter (Signed)

## 2018-12-18 ENCOUNTER — Ambulatory Visit (HOSPITAL_COMMUNITY): Payer: Medicare Other | Attending: Cardiovascular Disease

## 2018-12-18 ENCOUNTER — Other Ambulatory Visit: Payer: Self-pay

## 2018-12-18 ENCOUNTER — Ambulatory Visit (HOSPITAL_BASED_OUTPATIENT_CLINIC_OR_DEPARTMENT_OTHER): Payer: Medicare Other

## 2018-12-18 DIAGNOSIS — I2510 Atherosclerotic heart disease of native coronary artery without angina pectoris: Secondary | ICD-10-CM | POA: Diagnosis not present

## 2018-12-18 DIAGNOSIS — I340 Nonrheumatic mitral (valve) insufficiency: Secondary | ICD-10-CM | POA: Diagnosis not present

## 2018-12-18 LAB — MYOCARDIAL PERFUSION IMAGING
LV dias vol: 71 mL (ref 62–150)
LV sys vol: 23 mL
Peak HR: 72 {beats}/min
Rest HR: 55 {beats}/min
SDS: 2
SRS: 0
SSS: 2
TID: 1.05

## 2018-12-18 LAB — ECHOCARDIOGRAM COMPLETE
Height: 68 in
Weight: 2240 oz

## 2018-12-18 MED ORDER — TECHNETIUM TC 99M TETROFOSMIN IV KIT
11.0000 | PACK | Freq: Once | INTRAVENOUS | Status: AC | PRN
  Administered 2018-12-18: 11 via INTRAVENOUS
  Filled 2018-12-18: qty 11

## 2018-12-18 MED ORDER — REGADENOSON 0.4 MG/5ML IV SOLN
0.4000 mg | Freq: Once | INTRAVENOUS | Status: AC
  Administered 2018-12-18: 0.4 mg via INTRAVENOUS

## 2018-12-18 MED ORDER — TECHNETIUM TC 99M TETROFOSMIN IV KIT
31.9000 | PACK | Freq: Once | INTRAVENOUS | Status: AC | PRN
  Administered 2018-12-18: 31.9 via INTRAVENOUS
  Filled 2018-12-18: qty 32

## 2018-12-19 ENCOUNTER — Telehealth: Payer: Self-pay | Admitting: Physician Assistant

## 2018-12-19 NOTE — Telephone Encounter (Signed)
I called pt to confirm his appt for 12-20-18 with Almyra Deforest.

## 2018-12-20 ENCOUNTER — Ambulatory Visit (INDEPENDENT_AMBULATORY_CARE_PROVIDER_SITE_OTHER): Payer: Medicare Other | Admitting: Physician Assistant

## 2018-12-20 ENCOUNTER — Other Ambulatory Visit: Payer: Self-pay

## 2018-12-20 ENCOUNTER — Encounter: Payer: Self-pay | Admitting: Physician Assistant

## 2018-12-20 VITALS — BP 92/56 | HR 61 | Ht 68.5 in | Wt 141.2 lb

## 2018-12-20 DIAGNOSIS — I25118 Atherosclerotic heart disease of native coronary artery with other forms of angina pectoris: Secondary | ICD-10-CM | POA: Diagnosis not present

## 2018-12-20 DIAGNOSIS — I2510 Atherosclerotic heart disease of native coronary artery without angina pectoris: Secondary | ICD-10-CM | POA: Diagnosis not present

## 2018-12-20 DIAGNOSIS — I959 Hypotension, unspecified: Secondary | ICD-10-CM

## 2018-12-20 DIAGNOSIS — E785 Hyperlipidemia, unspecified: Secondary | ICD-10-CM

## 2018-12-20 MED ORDER — LISINOPRIL 2.5 MG PO TABS: 2.5000 mg | ORAL_TABLET | Freq: Every day | ORAL | 2 refills | Status: DC

## 2018-12-20 NOTE — Progress Notes (Signed)
Cardiology Office Note    Date:  12/22/2018   ID:  Jesus Jordan, DOB 1945-07-08, MRN [id number]  PCP:  Eulas Post, MD  Cardiologist:  Dr. Claiborne Billings  Chief Complaint  Patient presents with  . Follow-up    seen for Dr. Claiborne Billings.     History of Present Illness:  Jesus Jordan is a 73 y.o. male with PMH of CAD and hyperlipidemia.  Patient initially presented to [facility] in [facility] 2010 with large inferolateral MI with recurrent episode of V. fib requiring multiple defibrillator shocks and CPR.  He eventually underwent successful intervention to totally occluded proximal left circumflex artery, he also had PTCA of the distal left circumflex system.  RCA was diffusely diseased with stenosis up to 90% however was nondominant.  LAD was normal.  Stress test in 2011 showed area of scar in the inferolateral wall.  He was previously on Effient but this was later switched to Plavix.  Myoview in September 2015 showed low risk and a fixed small inferior scar without reversible ischemia, EF 64%.  Patient was last seen by Dr. Claiborne Billings in November 2019, at which time she was doing well.  More recently, patient was seen on 12/08/2018 in the ED for chest pain.  Troponin was negative.  Patient was set up to have outpatient ischemic evaluation with stress test and echocardiogram.  Myoview performed on 12/18/2018 showed EF 67%, small inferior wall infarct from apex to the base with mild peri-infarct ischemia, overall low risk study.  Echocardiogram obtained in 12/18/2018 showed EF 55 to 60%, mild to moderate TR.  Patient presented to office for cardiology follow-up today.  We have reviewed his echocardiogram and stress test results.  Given the low risk nature of the stress test, I recommend a medical therapy.  I did review with Dr. Claiborne Billings that the stress test had very mild peri-infarct ischemia.  Talking with the patient, he did have 2-4 episode of chest pain since he left the hospital.  He never took any nitroglycerin  for them.  Interestingly, he was able to play double tennis in the past 2 weeks without generating any exertional symptoms.  I checked his blood pressure today, his blood pressure is low.  Left arm 94/56, right arm 92/56.  He is also not taking any Imdur at home as well because previous dizziness.  I suspect the dizziness was caused by drop in the blood pressure.  I will hold off on restarting Imdur at this time.  I also decrease his lisinopril to 2.5 mg daily.  I plan to see the patient back in 3 weeks for virtual visit.  If his blood pressure is better by then, I plan to restart him on low-dose isosorbide mononitrate.  He can follow-up with Dr. Claiborne Billings in 3 months.  Past Medical History:  Diagnosis Date  . CAD 01/29/2009   Qualifier: Diagnosis of  By: [name] MD, [name]    . COPD (chronic obstructive pulmonary disease) ([place]) 12/10/2018  . GOUT 01/29/2009   Qualifier: Diagnosis of  By: [name] MD, [name]    . Gout   . Heart disease    cardiologist Dr Claiborne Billings  . History of MI (myocardial infarction) 09/13/2006   large inferolateral MI secondary to total occlusion of circumflex  . History of nuclear stress test 02/23/2012   exercise myoview; area of scar in inferolateral wall at mid-basal region  . Hyperlipidemia   . INGUINAL HERNIA 05/25/2009   Qualifier: Diagnosis of  By: [name] MD, Darnell Level  Past Surgical History:  Procedure Laterality Date  . CORONARY STENT PLACEMENT  09/12/2008   r/t MI; 3.5x71mm Xience DES to circumflex & PTCA at multiple sites in distal region of vessel; alos diffusely diseaase, narrowed, nondominant RCA  . FOOT SURGERY  1960  . HERNIA REPAIR  2011   left inguinal hernia  . TRANSTHORACIC ECHOCARDIOGRAM  03/02/2009   JG=28-36%; LV systolic function borderline reduced with mild inferior wall hypokinesis; mild-mod MR; mild TR; AV mildly sclerotic; mild aortic root dilatation    Current Medications: Outpatient Medications Prior to Visit  Medication Sig Dispense Refill   . allopurinol (ZYLOPRIM) 300 MG tablet Take 1 tablet by mouth once daily (Patient taking differently: Take 300 mg by mouth daily. ) 90 tablet 0  . aspirin 81 MG tablet Take 81 mg by mouth at bedtime.     . calcium carbonate (TUMS - DOSED IN MG ELEMENTAL CALCIUM) 500 MG chewable tablet Chew 1-2 tablets by mouth as needed for indigestion or heartburn.    . carvedilol (COREG) 12.5 MG tablet Take 12.5 mg by mouth in the morning and 12.5 mg in the evening 135 tablet 3  . clopidogrel (PLAVIX) 75 MG tablet Take 1 tablet (75 mg total) by mouth daily. 90 tablet 2  . colchicine 0.6 MG tablet Take 0.6 mg by mouth daily as needed (as directed- for gout flares).     . ezetimibe (ZETIA) 10 MG tablet Take 1 tablet (10 mg total) by mouth at bedtime.    . fluticasone (CUTIVATE) 0.05 % cream Apply 1 application topically daily as needed (to affected areas of face).     . Multiple Vitamins-Minerals (ONE-A-DAY MENS 50+ ADVANTAGE) TABS Take 1 tablet by mouth daily.    . nitroGLYCERIN (NITROSTAT) 0.4 MG SL tablet DISSOLVE ONE TABLET UNDER THE TONGUE EVERY 5 MINUTES AS NEEDED FOR CHEST PAIN.  DO NOT EXCEED A TOTAL OF 3 DOSES IN 15 MINUTES 25 tablet 3  . pantoprazole (PROTONIX) 40 MG tablet Take 1 tablet (40 mg total) by mouth daily as needed. 30 tablet 4  . simvastatin (ZOCOR) 40 MG tablet Take 1 tablet (40 mg total) by mouth at bedtime. 90 tablet 2  . tetrahydrozoline-zinc (VISINE-AC) 0.05-0.25 % ophthalmic solution Place 2 drops into both eyes 3 (three) times daily as needed (for itching ot redness).    . isosorbide mononitrate (IMDUR) 30 MG 24 hr tablet TAKE ONE TABLET BY MOUTH ONCE DAILY 90 tablet 0  . lisinopril (ZESTRIL) 5 MG tablet Take 1 tablet by mouth once daily (Patient taking differently: Take 5 mg by mouth daily. ) 90 tablet 1  . lisinopril (ZESTRIL) 2.5 MG tablet Take 2.5 mg by mouth daily. Take 1 tablet by mouth daily      No facility-administered medications prior to visit.      Allergies:   Imdur  [isosorbide nitrate]   Social History   Socioeconomic History  . Marital status: Single    Spouse name: Not on file  . Number of children: 3  . Years of education: Not on file  . Highest education level: Not on file  Occupational History  . Not on file  Social Needs  . Financial resource strain: Not on file  . Food insecurity    Worry: Not on file    Inability: Not on file  . Transportation needs    Medical: Not on file    Non-medical: Not on file  Tobacco Use  . Smoking status: Current Some Day Smoker  Years: 30.00    Types: Cigarettes  . Smokeless tobacco: Never Used  . Tobacco comment: skip days some; pack last 3 days   Substance and Sexual Activity  . Alcohol use: Yes    Comment: occasional   . Drug use: No  . Sexual activity: Not on file  Lifestyle  . Physical activity    Days per week: Not on file    Minutes per session: Not on file  . Stress: Not on file  Relationships  . Social Herbalist on phone: Not on file    Gets together: Not on file    Attends religious service: Not on file    Active member of club or organization: Not on file    Attends meetings of clubs or organizations: Not on file    Relationship status: Not on file  Other Topics Concern  . Not on file  Social History Narrative  . Not on file     Family History:  The patient's family history includes Cancer in his paternal grandfather; Colon cancer in his father; Heart disease (age of onset: 30) in his brother; Hyperlipidemia in his brother.   ROS:   Please see the history of present illness.    ROS All other systems reviewed and are negative.   PHYSICAL EXAM:   VS:  BP (!) 92/56   Pulse 61   Ht 5' 8.5" (1.74 m)   Wt 141 lb 3.2 oz (64 kg)   BMI 21.16 kg/m    GEN: Well nourished, well developed, in no acute distress  HEENT: normal  Neck: no JVD, carotid bruits, or masses Cardiac: RRR; no murmurs, rubs, or gallops,no edema  Respiratory:  clear to auscultation  bilaterally, normal work of breathing GI: soft, nontender, nondistended, + BS MS: no deformity or atrophy  Skin: warm and dry, no rash Neuro:  Alert and Oriented x 3, Strength and sensation are intact Psych: euthymic mood, full affect  Wt Readings from Last 3 Encounters:  12/20/18 141 lb 3.2 oz (64 kg)  12/18/18 140 lb (63.5 kg)  12/09/18 140 lb (63.5 kg)      Studies/Labs Reviewed:   EKG:  EKG is not ordered today.    Recent Labs: 12/09/2018: ALT 19; BUN 16; Creatinine, Ser 0.96; Hemoglobin 13.5; Platelets 140; Potassium 3.9; Sodium 134   Lipid Panel    Component Value Date/Time   CHOL 154 12/10/2018 1230   CHOL 142 03/06/2013 1039   TRIG 30 12/10/2018 1230   TRIG 74 03/06/2013 1039   HDL 87 12/10/2018 1230   HDL 73 03/06/2013 1039   CHOLHDL 1.8 12/10/2018 1230   VLDL 6 12/10/2018 1230   LDLCALC 61 12/10/2018 1230   LDLCALC 54 03/06/2013 1039    Additional studies/ records that were reviewed today include:   Myoview 12/18/2018 Study Highlights    Nuclear stress EF: 67%.  Findings consistent with prior myocardial infarction with peri-infarct ischemia.  This is a low risk study.  The left ventricular ejection fraction is hyperdynamic (>65%).   Small inferior wall infarct from apex to base with mild peri infarct ischemia EF preserved 67%    Echo 12/18/2018 IMPRESSIONS    1. The left ventricle has normal systolic function, with an ejection fraction of 55-60%. The cavity size was normal. Left ventricular diastolic parameters were normal.  2. The right ventricle has normal systolic function. The cavity was normal. There is no increase in right ventricular wall thickness.  3. Tricuspid valve regurgitation  is mild-moderate.  4. The aortic valve is tricuspid. Moderate thickening of the aortic valve. Moderate calcification of the aortic valve.  5. There is dilatation at the level of the sinuses of Valsalva measuring 39 mm.   ASSESSMENT:    1. Hypotension,  unspecified hypotension type   2. Coronary artery disease involving native coronary artery of native heart with other form of angina pectoris ([place])   3. Hyperlipidemia LDL goal <70      PLAN:  In order of problems listed above:  1. Hypotension: Decrease lisinopril to 2.5 mg daily  2. CAD: Continue to have intermittent chest discomfort despite reassuring echocardiogram and stress test.  I reviewed the stress test result with Dr. Claiborne Billings because of mild peri-infarct ischemia.  We recommended medical therapy at this time.  He took himself off of isosorbide mononitrate due to dizziness which I suspect is related to drop in the blood pressure.  Once his blood pressure improves, I think we can rechallenge him with Imdur  3. Hyperlipidemia: Continue Zocor 40 mg daily    Medication Adjustments/Labs and Tests Ordered: Current medicines are reviewed at length with the patient today.  Concerns regarding medicines are outlined above.  Medication changes, Labs and Tests ordered today are listed in the Patient Instructions below. Patient Instructions  Medication Instructions:   Decrease Lisinopril to 2.5 Mg daily  If you need a refill on your cardiac medications before your next appointment, please call your pharmacy.   Lab work: NONE ordered at this time of appointment   If you have labs (blood work) drawn today and your tests are completely normal, you will receive your results only by: Marland Kitchen MyChart Message (if you have MyChart) OR . A paper copy in the mail If you have any lab test that is abnormal or we need to change your treatment, we will call you to review the results.  Testing/Procedures: NONE ordered at this time of appointment   Follow-Up: At [facility], you and your health needs are our priority.  As part of our continuing mission to provide you with exceptional heart care, we have created designated Provider Care Teams.  These Care Teams include your primary Cardiologist  (physician) and Advanced Practice Providers (APPs -  Physician Assistants and Nurse Practitioners) who all work together to provide you with the care you need, when you need it. You will need to keep your scheduled appointment with Shelva Majestic, MD.   Any Other Special Instructions Will Be Listed Below (If Applicable).  Monitor/check your Blood pressure daily   Use the sublingual Nirto tablets as needed for Chest Pains DO NOT USE MORE THAN 3 TABLETS    Signed, Almyra Deforest, [place]  12/22/2018 12:22 AM    [facility] [address], [place], [place]  [address] Phone: [phone]; Fax: [phone]

## 2018-12-20 NOTE — Patient Instructions (Addendum)
Medication Instructions:   Decrease Lisinopril to 2.5 Mg daily  If you need a refill on your cardiac medications before your next appointment, please call your pharmacy.   Lab work: NONE ordered at this time of appointment   If you have labs (blood work) drawn today and your tests are completely normal, you will receive your results only by: Marland Kitchen MyChart Message (if you have MyChart) OR . A paper copy in the mail If you have any lab test that is abnormal or we need to change your treatment, we will call you to review the results.  Testing/Procedures: NONE ordered at this time of appointment   Follow-Up: At [facility], you and your health needs are our priority.  As part of our continuing mission to provide you with exceptional heart care, we have created designated Provider Care Teams.  These Care Teams include your primary Cardiologist (physician) and Advanced Practice Providers (APPs -  Physician Assistants and Nurse Practitioners) who all work together to provide you with the care you need, when you need it. You will need to keep your scheduled appointment with Shelva Majestic, MD.   Any Other Special Instructions Will Be Listed Below (If Applicable).  Monitor/check your Blood pressure daily   Use the sublingual Nirto tablets as needed for Chest Pains DO NOT USE MORE THAN 3 TABLETS

## 2018-12-22 ENCOUNTER — Encounter: Payer: Self-pay | Admitting: Physician Assistant

## 2019-02-08 DIAGNOSIS — Z23 Encounter for immunization: Secondary | ICD-10-CM | POA: Diagnosis not present

## 2019-02-15 ENCOUNTER — Other Ambulatory Visit: Payer: Self-pay

## 2019-02-15 ENCOUNTER — Encounter: Payer: Self-pay | Admitting: Cardiovascular Disease

## 2019-02-15 ENCOUNTER — Ambulatory Visit (INDEPENDENT_AMBULATORY_CARE_PROVIDER_SITE_OTHER): Payer: Medicare Other | Admitting: Cardiovascular Disease

## 2019-02-15 VITALS — BP 95/60 | HR 62 | Temp 97.7°F | Ht 68.5 in | Wt 138.4 lb

## 2019-02-15 DIAGNOSIS — I951 Orthostatic hypotension: Secondary | ICD-10-CM

## 2019-02-15 DIAGNOSIS — E785 Hyperlipidemia, unspecified: Secondary | ICD-10-CM | POA: Diagnosis not present

## 2019-02-15 DIAGNOSIS — I2510 Atherosclerotic heart disease of native coronary artery without angina pectoris: Secondary | ICD-10-CM

## 2019-02-15 DIAGNOSIS — R42 Dizziness and giddiness: Secondary | ICD-10-CM | POA: Diagnosis not present

## 2019-02-15 DIAGNOSIS — I25118 Atherosclerotic heart disease of native coronary artery with other forms of angina pectoris: Secondary | ICD-10-CM

## 2019-02-15 DIAGNOSIS — I252 Old myocardial infarction: Secondary | ICD-10-CM

## 2019-02-15 NOTE — Patient Instructions (Signed)
Medication Instructions:  STOP LISINOPRIL If you need a refill on your cardiac medications before your next appointment, please call your pharmacy.  Follow-Up:  You will need a follow up appointment in 2 [place], PA-C  You will need a follow up appointment in 6 months.  Please call our office 3 months in advance, November 2020 to schedule this, February 2021 appointment.  You may see Shelva Majestic, MD or one of the following Advanced Practice Providers on your designated Care Team: [place], [place] . Fabian Sharp, PA-C     At [facility], you and your health needs are our priority.  As part of our continuing mission to provide you with exceptional heart care, we have created designated Provider Care Teams.  These Care Teams include your primary Cardiologist (physician) and Advanced Practice Providers (APPs -  Physician Assistants and Nurse Practitioners) who all work together to provide you with the care you need, when you need it.  Thank you for choosing [facility] at [facility]!!

## 2019-02-15 NOTE — Progress Notes (Signed)
Patient ID: Jesus Jordan, male   DOB: January 24, 1946, 73 y.o.   MRN: [id number]    PCP: Dr. [name]   HPI: Jesus Jordan is a 73 y.o. male  who presents to the office today for a 9 month cardiology evaluation.  Jesus Jordan suffered a large inferolateral myocardial infarction on 09/12/2008 and presented to [facility] with recurrent episodes of ventricular fibrillation and required numerous defibrillator shocks along with CPR.  I was able to successfully perform intervention to a totally occluded proximal left circumflex coronary artery. This was stented and more distally he had diffuse disease with joint and underwent PTCA at multiple sites in the very distal aspect of this dominant circumflex system. His RCA  was diffusely diseased with stenoses of 90% and was nondominant. His LAD was normal. He had undergone an initial stress test in one year showed an area of scar and inferolateral wall at the mid to basal region. He subsequently was taken off his Effient and then switched to Plavix. A nuclear perfusion study in September 2012 was unchanged from previously and showed the small moderate region of the mid to basal inferior inferolateral scar without associated ischemia.  On 03/04/2014 a three-year followup nuclear perfusion study continued to be low risk and only showed a small fixed inferior defect without reversible ischemia.  He had normal wall motion.  Ejection fraction was 64%.  I saw him in March 2019 at which time he had developed a right inguinal hernia and was in need for surgical repair.  He had previously undergone successful repair of a left inguinal hernia by Dr. Lucia Gaskins.  When I saw him, he was stable without anginal symptomatology or palpitations but had noticed some mild episodes of dizziness.  I recommended he reduce lisinopril from 10 mg to 5 mg.  He underwent right inguinal hernia surgery successfully without cardiovascular compromise.  His dizziness has improved but at times he still  notes rare episodes.  Of note, since suffering his myocardial infarction in March 2010 he has lost 75 pounds.    I last saw him in November 2019 and at that time he was slightly bradycardic.  I suggested slight reduction of carvedilol to 12.5 mg in the morning and 6.2 5 at night.  Dr. [name] has been checking laboratory and he was on Zetia 10 mg and simvastatin 40 mg.  I suggested that if his LDL cholesterol was greater than 70 it would be advisable to switch to either atorvastatin or rosuvastatin rather than simvastatin.  He apparently had been doing fairly well but in June re-presented to the emergency room with complaints of chest pain.  Troponin was negative.  He subsequently was referred for an outpatient Myoview study on December 18, 2018 which showed an EF of 67% and small inferior wall infarct from apex to base with minimal peri-infarction ischemia but was felt to be a low risk study.  His echo Doppler study showed an EF of 55 to 60% with mild to moderate TR.  He was evaluated by Almyra Deforest, PAC on December 20, 2018 his blood pressure was low.  He has continued to play doubles tennis without exertional chest pain.  He was off isosorbide due to his low blood pressure and this was not restarted at his follow-up visit with [facility].  Presently, he feels improved.  Blood pressure has remained low.  He denies recurrent chest pain symptomatology or shortness of breath.  If he stands up quickly he does admit to some lightheadedness.  He presents for evaluation.  Past Medical History:  Diagnosis Date   CAD 01/29/2009   Qualifier: Diagnosis of  By: [name] MD, [name]     COPD (chronic obstructive pulmonary disease) ([place]) 12/10/2018   GOUT 01/29/2009   Qualifier: Diagnosis of  By: [name] MD, [name]     Gout    Heart disease    cardiologist Dr Claiborne Billings   History of MI (myocardial infarction) 09/13/2006   large inferolateral MI secondary to total occlusion of circumflex   History of nuclear stress test 02/23/2012     exercise myoview; area of scar in inferolateral wall at mid-basal region   Hyperlipidemia    INGUINAL HERNIA 05/25/2009   Qualifier: Diagnosis of  By: [name] MD, [name]      Past Surgical History:  Procedure Laterality Date   CORONARY STENT PLACEMENT  09/12/2008   r/t MI; 3.5x70m Xience DES to circumflex & PTCA at multiple sites in distal region of vessel; alos diffusely diseaase, narrowed, nondominant RCA   [place] 2011   left inguinal hernia   TRANSTHORACIC ECHOCARDIOGRAM  03/02/2009   EOZ=22-48% LV systolic function borderline reduced with mild inferior wall hypokinesis; mild-mod MR; mild TR; AV mildly sclerotic; mild aortic root dilatation    Allergies  Allergen Reactions   Imdur [Isosorbide Nitrate] Other (See Comments)    Caused vertigo    Current Outpatient Medications  Medication Sig Dispense Refill   allopurinol (ZYLOPRIM) 300 MG tablet Take 1 tablet by mouth once daily (Patient taking differently: Take 300 mg by mouth daily. ) 90 tablet 0   aspirin 81 MG tablet Take 81 mg by mouth at bedtime.      calcium carbonate (TUMS - DOSED IN MG ELEMENTAL CALCIUM) 500 MG chewable tablet Chew 1-2 tablets by mouth as needed for indigestion or heartburn.     carvedilol (COREG) 12.5 MG tablet Take 12.5 mg by mouth in the morning and 12.5 mg in the evening 135 tablet 3   clopidogrel (PLAVIX) 75 MG tablet Take 1 tablet (75 mg total) by mouth daily. 90 tablet 2   colchicine 0.6 MG tablet Take 0.6 mg by mouth daily as needed (as directed- for gout flares).      ezetimibe (ZETIA) 10 MG tablet Take 1 tablet (10 mg total) by mouth at bedtime.     fluticasone (CUTIVATE) 0.05 % cream Apply 1 application topically daily as needed (to affected areas of face).      Multiple Vitamins-Minerals (ONE-A-DAY MENS 50+ ADVANTAGE) TABS Take 1 tablet by mouth daily.     nitroGLYCERIN (NITROSTAT) 0.4 MG SL tablet DISSOLVE ONE TABLET UNDER THE TONGUE EVERY 5  MINUTES AS NEEDED FOR CHEST PAIN.  DO NOT EXCEED A TOTAL OF 3 DOSES IN 15 MINUTES 25 tablet 3   pantoprazole (PROTONIX) 40 MG tablet Take 1 tablet (40 mg total) by mouth daily as needed. 30 tablet 4   simvastatin (ZOCOR) 40 MG tablet Take 1 tablet (40 mg total) by mouth at bedtime. 90 tablet 2   tetrahydrozoline-zinc (VISINE-AC) 0.05-0.25 % ophthalmic solution Place 2 drops into both eyes 3 (three) times daily as needed (for itching ot redness).     No current facility-administered medications for this visit.     Social History   Socioeconomic History   Marital status: Single    Spouse name: Not on file   Number of children: 3   Years of education: Not on file   Highest education level: Not  on file  Occupational History   Not on file  Social Needs   Financial resource strain: Not on file   Food insecurity    Worry: Not on file    Inability: Not on file   Transportation needs    Medical: Not on file    Non-medical: Not on file  Tobacco Use   Smoking status: Current Some Day Smoker    Years: 30.00    Types: Cigarettes   Smokeless tobacco: Never Used   Tobacco comment: skip days some; pack last 3 days   Substance and Sexual Activity   Alcohol use: Yes    Comment: occasional    Drug use: No   Sexual activity: Not on file  Lifestyle   Physical activity    Days per week: Not on file    Minutes per session: Not on file   Stress: Not on file  Relationships   Social connections    Talks on phone: Not on file    Gets together: Not on file    Attends religious service: Not on file    Active member of club or organization: Not on file    Attends meetings of clubs or organizations: Not on file    Relationship status: Not on file   Intimate partner violence    Fear of current or ex partner: Not on file    Emotionally abused: Not on file    Physically abused: Not on file    Forced sexual activity: Not on file  Other Topics Concern   Not on file  Social  History Narrative   Not on file   Social history is notable that he is divorced.  His daughter is now working in [employer].  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  There is no present tobacco use.  Family History  Problem Relation Age of Onset   Colon cancer Father    Hyperlipidemia Brother    Heart disease Brother 27       massive MI   Cancer Paternal Grandfather        colon   Socially he is divorced. Has 3 children and 2 grandchildren. No tobacco use. He does drink occasional alcohol. He does play tennis.  His daughter, [name] is working at [employer] in [employer] in [employer].  ROS General: Negative; No fevers, chills, or night sweats;  HEENT: Negative; No changes in vision or hearing, sinus congestion, difficulty swallowing Pulmonary: Negative; No cough, wheezing, shortness of breath, hemoptysis Cardiovascular: Negative; No chest pain, presyncope, syncope, palpitations GI: Negative; No nausea, vomiting, diarrhea, or abdominal pain GU: Positive for  Bilateral inguinal hernia surgeries;  No dysuria, hematuria, or difficulty voiding Musculoskeletal: Negative; no myalgias, joint pain, or weakness He has a history of gout, but denies any recurrence. Hematologic/Oncology: Negative; no easy bruising, bleeding Endocrine: Negative; no heat/cold intolerance; no diabetes Neuro: Negative; no changes in balance, headaches Skin: Negative; No rashes or skin lesions Psychiatric: Negative; No behavioral problems, depression Sleep: Negative; No snoring, daytime sleepiness, hypersomnolence, bruxism, restless legs, hypnogognic hallucinations, no cataplexy Other comprehensive 14 point system review is negative.  PE BP 95/60    Pulse 62    Temp 97.7 F (36.5 C)    Ht 5' 8.5" (1.74 m)    Wt 138 lb 6.4 oz (62.8 kg)    SpO2 100%    BMI 20.74 kg/m    Repeat blood pressure by me was 100/60 supine and 90/58 standing  Wt Readings  from Last 3 Encounters:    02/15/19 138 lb 6.4 oz (62.8 kg)  12/20/18 141 lb 3.2 oz (64 kg)  12/18/18 140 lb (63.5 kg)   General: Alert, oriented, no distress.  Skin: normal turgor, no rashes, warm and dry HEENT: Normocephalic, atraumatic. Pupils equal round and reactive to light; sclera anicteric; extraocular muscles intact;  Nose without nasal septal hypertrophy Mouth/Parynx benign; Mallinpatti scale 2 Neck: No JVD, no carotid bruits; normal carotid upstroke Lungs: clear to ausculatation and percussion; no wheezing or rales Chest wall: without tenderness to palpitation Heart: PMI not displaced, RRR, s1 s2 normal, 1/6 systolic murmur, no diastolic murmur, no rubs, gallops, thrills, or heaves Abdomen: soft, nontender; no hepatosplenomehaly, BS+; abdominal aorta nontender and not dilated by palpation. Back: no CVA tenderness Pulses 2+ Musculoskeletal: full range of motion, normal strength, no joint deformities Extremities: no clubbing cyanosis or edema, Homan's sign negative  Neurologic: grossly nonfocal; Cranial nerves grossly wnl Psychologic: Normal mood and affect   ECG (independently read by me): Normal sinus rhythm at 62 bpm; first-degree AV block with PR interval 222 ms.  No ectopy.  QTc interval 414 ms  November 2019 ECG (independently read by me): Sinus bradycardia 57 bpm.  Borderline first-degree AV block with appeared normal at 206 ms.  March 2019 ECG (independently read by me): sinus bradycardia 58 bpm.  Borderline first-degree AV blo  No ST segment abnormalities.  November 2017 ECG (independently read by me): Normal sinus rhythm at 61 bpm.  Borderline first-degree AV block with a PR interval of 210 ms.  QTc interval normal at 390 ms.  November 2016 ECG (independently read by me): Normal sinus rhythm at 61 bpm.  No ectopy.  Normal intervals.  No ECG evidence of prior myocardial infarction.  September 2015 ECG (independently read by me): Sinus bradycardia 57 beats per minute.  Borderline first-degree  AV block with PR interval of 204 ms.  Prior ECG: Sinus rhythm at 63 beats per minute. Normal intervals.  LABS:  BMP Latest Ref Rng & Units 12/09/2018 07/10/2017 03/21/2016  Glucose 70 - 99 mg/dL 155(H) 99 91  BUN 8 - 23 mg/dL '16 14 10  ' Creatinine 0.61 - 1.24 mg/dL 0.96 0.84 0.81  Sodium 135 - 145 mmol/L 134(L) 138 139  Potassium 3.5 - 5.1 mmol/L 3.9 4.5 4.3  Chloride 98 - 111 mmol/L 103 103 104  CO2 22 - 32 mmol/L '22 29 27  ' Calcium 8.9 - 10.3 mg/dL 9.2 9.8 9.4   Hepatic Function Latest Ref Rng & Units 12/09/2018 07/10/2017 03/21/2016  Total Protein 6.5 - 8.1 g/dL 6.8 6.9 7.3  Albumin 3.5 - 5.0 g/dL 4.0 4.4 4.1  AST 15 - 41 U/L '30 23 27  ' ALT 0 - 44 U/L '19 18 20  ' Alk Phosphatase 38 - 126 U/L 66 65 73  Total Bilirubin 0.3 - 1.2 mg/dL 1.1 0.8 0.8  Bilirubin, Direct 0.0 - 0.3 mg/dL - 0.1 0.2   CBC Latest Ref Rng & Units 12/09/2018 03/21/2016 03/18/2014  WBC 4.0 - 10.5 K/uL 6.3 5.7 6.2  Hemoglobin 13.0 - 17.0 g/dL 13.5 14.9 14.9  Hematocrit 39.0 - 52.0 % 39.8 43.5 44.3  Platelets 150 - 400 K/uL 140(L) 157.0 155   Lab Results  Component Value Date   MCV 100.5 (H) 12/09/2018   MCV 98.6 03/21/2016   MCV 98.0 03/18/2014   Lab Results  Component Value Date   TSH 0.68 03/21/2016   Lipid Panel     Component Value Date/Time  CHOL 154 12/10/2018 1230   CHOL 142 03/06/2013 1039   TRIG 30 12/10/2018 1230   TRIG 74 03/06/2013 1039   HDL 87 12/10/2018 1230   HDL 73 03/06/2013 1039   CHOLHDL 1.8 12/10/2018 1230   VLDL 6 12/10/2018 1230   LDLCALC 61 12/10/2018 1230   LDLCALC 54 03/06/2013 1039   RADIOLOGY: No results found.   IMPRESSION: 1. Coronary artery disease involving native coronary artery of native heart with other form of angina pectoris ([place])   2. Hyperlipidemia LDL goal <70   3. Dizziness   4. Orthostatic hypotension   5. Old MI (myocardial infarction)     ASSESSMENT AND PLAN: Mr. Haff is a 73 year-old Caucasian male who suffered a VF cardiac arrest secondary to total  proximal occlusion of his of a dominant left circumflex artery artery on 09/12/2008.  He has been demonstrated to have significant myocardial salvage in the lateral wall but does have mid to basal inferior to inferolateral scar. His  nuclear perfusion study in 2015  did not reveal any wall motion abnormality and his ejection fraction was 64% without evidence for ischemia.  He recently was evaluated in the emergency room with chest pain symptomatology as well as some anxiety.  Troponins were negative.  Subsequent nuclear study was unchanged from previously and was low risk with previous demonstration of inferior scar.  His echo Doppler study reveals an EF of 55 to 60% and he had normal diastolic parameters.  There was mild to moderate TR.  He has had issues with orthostatic hypotension resulting in reduction in his medical regimen.  His blood pressure today remains low on his regimen consisting of lisinopril 2.5 mg, carvedilol which may still be at 12.5 mg twice a day.  With his normal systolic and diastolic function and some orthostatic dizziness I have suggested discontinuance of lisinopril.  He is no longer on nitrate therapy.  His ECG today remained stable and shows sinus rhythm at 62 with mild first-degree AV block.  Lipid studies on December 10, 2018 showed a cholesterol of 154, HDL 87 LDL 61 and he continues to be on Zetia 10 mg in addition to simvastatin 40 mg.  He continues to be on DAPT with aspirin/Plavix.  I have recommended he have a follow-up appointment to see Almyra Deforest, [facility] in 2 months and I will see him in 6 months for cardiology reevaluation   Time spent: 25 minutes Troy Sine, MD, [facility]  02/17/2019 3:15 PM

## 2019-02-17 ENCOUNTER — Encounter: Payer: Self-pay | Admitting: Cardiovascular Disease

## 2019-03-14 ENCOUNTER — Other Ambulatory Visit: Payer: Self-pay | Admitting: Family Medicine

## 2019-03-25 ENCOUNTER — Telehealth: Payer: Self-pay | Admitting: *Deleted

## 2019-03-25 NOTE — Telephone Encounter (Signed)
A message was left, re: schedule change and his appointment needs to be rescheduled.

## 2019-03-26 ENCOUNTER — Telehealth: Payer: Self-pay | Admitting: *Deleted

## 2019-03-26 DIAGNOSIS — L718 Other rosacea: Secondary | ICD-10-CM | POA: Diagnosis not present

## 2019-03-26 DIAGNOSIS — C4442 Squamous cell carcinoma of skin of scalp and neck: Secondary | ICD-10-CM | POA: Diagnosis not present

## 2019-03-26 DIAGNOSIS — L570 Actinic keratosis: Secondary | ICD-10-CM | POA: Diagnosis not present

## 2019-03-26 DIAGNOSIS — Z85828 Personal history of other malignant neoplasm of skin: Secondary | ICD-10-CM | POA: Diagnosis not present

## 2019-03-26 DIAGNOSIS — D485 Neoplasm of uncertain behavior of skin: Secondary | ICD-10-CM | POA: Diagnosis not present

## 2019-03-26 DIAGNOSIS — L821 Other seborrheic keratosis: Secondary | ICD-10-CM | POA: Diagnosis not present

## 2019-03-26 NOTE — Telephone Encounter (Signed)
A message was left today,re: schedule change.

## 2019-04-17 ENCOUNTER — Ambulatory Visit: Payer: Medicare Other | Admitting: Physician Assistant

## 2019-04-29 ENCOUNTER — Encounter: Payer: Self-pay | Admitting: Family Medicine

## 2019-04-29 ENCOUNTER — Other Ambulatory Visit: Payer: Self-pay

## 2019-04-29 ENCOUNTER — Ambulatory Visit (INDEPENDENT_AMBULATORY_CARE_PROVIDER_SITE_OTHER): Payer: Medicare Other | Admitting: Family Medicine

## 2019-04-29 VITALS — BP 118/84 | HR 60 | Temp 98.1°F | Resp 18 | Ht 68.5 in | Wt 140.0 lb

## 2019-04-29 DIAGNOSIS — M1A9XX0 Chronic gout, unspecified, without tophus (tophi): Secondary | ICD-10-CM

## 2019-04-29 DIAGNOSIS — I2510 Atherosclerotic heart disease of native coronary artery without angina pectoris: Secondary | ICD-10-CM

## 2019-04-29 DIAGNOSIS — Z0000 Encounter for general adult medical examination without abnormal findings: Secondary | ICD-10-CM | POA: Diagnosis not present

## 2019-04-29 DIAGNOSIS — I10 Essential (primary) hypertension: Secondary | ICD-10-CM | POA: Diagnosis not present

## 2019-04-29 DIAGNOSIS — E785 Hyperlipidemia, unspecified: Secondary | ICD-10-CM

## 2019-04-29 NOTE — Patient Instructions (Signed)

## 2019-04-29 NOTE — Progress Notes (Signed)
Subjective:     Patient ID: Jesus Jordan, male   DOB: March 28, 1946, 73 y.o.   MRN: [id number]  HPI   Jesus Jordan is here for Medicare wellness exam and medical follow-up. His chronic problems include hypertension, COPD, hyperlipidemia, history of CAD, gout, ongoing nicotine use.  Smokes about 1 pack cigarettes per week.  Has smoked much heavier in the past.  Current medications reviewed.  No recent gout flares.  Denies any recent chest pains.  His current medication include allopurinol, carvedilol, Plavix, Zetia, Protonix, simvastatin.  Recent lipids and chemistries from June reviewed with patient.  1.  Risk factors based on Past Medical , Social, and Family history reviewed and as indicated below with no changes  2.  Limitations in physical activities None.  No recent falls.  3.  Depression/mood No active depression or anxiety issues PHQ 2 equals 1  4.  Hearing -he has some bilateral hearing loss.  He has had hearing aids but lost his hearing aids about a year ago and has never pursued getting these replaced.  5.  ADLs independent in all.  6.  Cognitive function (orientation to time and place, language, writing, speech,memory) no short or long term memory issues.  Language and judgement intact.  7.  Home Safety no issues  8.  Height, weight, and visual acuity.all stable.  9.  Counseling discussed -Counseled regarding age and gender appropriate preventative screenings and immunizations.  10. Recommendation of preventive services.  We discussed abdominal aortic aneurysm screening as well as low-dose CT lung cancer screening but he declines both.  We discussed shingles vaccine but he declines at this time  11. Labs based on risk factors-none indicated at this time  12. Care Plan-as below.   13. Other Providers-Dr. Shelva Majestic, cardiology   Dr. Danella Sensing, dermatology  14. Written schedule of screening/prevention services given to patient. Health Maintenance  Topic Date Due  .  TETANUS/TDAP  01/08/2020  . COLONOSCOPY  05/06/2020  . INFLUENZA VACCINE  Completed  . Hepatitis C Screening  Completed  . PNA vac Low Risk Adult  Completed   Wt Readings from Last 3 Encounters:  04/29/19 140 lb (63.5 kg)  02/15/19 138 lb 6.4 oz (62.8 kg)  12/20/18 141 lb 3.2 oz (64 kg)    Review of Systems  Constitutional: Negative for appetite change, fatigue and unexpected weight change.  Eyes: Negative for visual disturbance.  Respiratory: Negative for cough, chest tightness, shortness of breath and wheezing.   Cardiovascular: Negative for chest pain, palpitations and leg swelling.  Endocrine: Negative for polydipsia and polyuria.  Genitourinary: Negative for dysuria.  Neurological: Negative for dizziness, syncope, weakness, light-headedness and headaches.       Objective:   Physical Exam Constitutional:      Appearance: He is well-developed.  HENT:     Right Ear: External ear normal.     Left Ear: External ear normal.  Eyes:     Pupils: Pupils are equal, round, and reactive to light.  Neck:     Musculoskeletal: Neck supple.     Thyroid: No thyromegaly.  Cardiovascular:     Rate and Rhythm: Normal rate and regular rhythm.  Pulmonary:     Effort: Pulmonary effort is normal. No respiratory distress.     Breath sounds: Normal breath sounds. No wheezing or rales.  Neurological:     Mental Status: He is alert and oriented to person, place, and time.        Assessment:     #1  Medicare subsequent annual wellness visit.  We addressed several health maintenance issues as below  #2 history of CAD  #3 ongoing nicotine use  #4 hypertension stable  #5 hyperlipidemia with goal LDL less than 70  #6 history of gout currently stable on allopurinol    Plan:     -Discussed Shingrix vaccine and he will check on coverage -Discussed pros and cons of low-dose CT lung cancer screening he declines at this time but will consider -strongly advice smoking cessation.   -Discussed one-time Medicare abdominal aortic aneurysm screening and he declines at this time but will consider -Flu vaccine already given -Recommend routine follow-up in 6 months and sooner as needed  Eulas Post MD [facility] at [facility]

## 2019-04-30 ENCOUNTER — Other Ambulatory Visit: Payer: Self-pay | Admitting: Cardiovascular Disease

## 2019-05-02 ENCOUNTER — Encounter: Payer: Self-pay | Admitting: Physician Assistant

## 2019-05-02 ENCOUNTER — Other Ambulatory Visit: Payer: Self-pay

## 2019-05-02 ENCOUNTER — Ambulatory Visit (INDEPENDENT_AMBULATORY_CARE_PROVIDER_SITE_OTHER): Payer: Medicare Other | Admitting: Physician Assistant

## 2019-05-02 VITALS — BP 118/66 | HR 62 | Ht 68.0 in | Wt 140.0 lb

## 2019-05-02 DIAGNOSIS — I951 Orthostatic hypotension: Secondary | ICD-10-CM

## 2019-05-02 DIAGNOSIS — I2510 Atherosclerotic heart disease of native coronary artery without angina pectoris: Secondary | ICD-10-CM

## 2019-05-02 DIAGNOSIS — E785 Hyperlipidemia, unspecified: Secondary | ICD-10-CM

## 2019-05-02 NOTE — Progress Notes (Signed)
Cardiology Office Note:    Date:  05/02/2019   ID:  Jesus Jordan, DOB 1946/03/07, MRN [id number]  PCP:  Eulas Post, MD  Cardiologist:  Shelva Majestic, MD  Electrophysiologist:  None   Referring MD: Eulas Post, MD   Chief Complaint  Patient presents with  . Follow-up    seen for Dr. Claiborne Billings, 2 month followup    History of Present Illness:    Jesus Jordan is a 73 y.o. male with a hx of CAD and hyperlipidemia.  Patient initially presented to [facility] in March 2010 with large inferolateral MI with recurrent episode of V. fib requiring multiple defibrillator shocks and CPR.  He eventually underwent successful intervention to totally occluded proximal left circumflex artery, he also had PTCA of the distal left circumflex system.  RCA was diffusely diseased with stenosis up to 90% however was nondominant.  LAD was normal.  Stress test in 2011 showed area of scar in the inferolateral wall.  He was previously on Effient but this was later switched to Plavix.  Myoview in September 2015 showed low risk and a fixed small inferior scar without reversible ischemia, EF 64%.  More recently, patient was seen on 12/08/2018 in the ED for chest pain.  Troponin was negative.  Patient was set up to have outpatient ischemic evaluation with stress test and echocardiogram. Myoview performed on 12/18/2018 showed EF 67%, small inferior wall infarct from apex to the base with mild peri-infarct ischemia, overall low risk study.  Echocardiogram obtained in 12/18/2018 showed EF 55 to 60%, mild to moderate TR.  I last saw the patient on 12/20/2018, given the low risk nature of the stress test, I recommended medical therapy.  I did review the stress test with Dr. Claiborne Billings as well as patient had very mild peri-infarct ischemia.  His blood pressure was low that day was systolic blood pressure in the 90s.  He was having some dizziness which was likely related to the low blood pressure.  I reduced his lisinopril to 2.5  mg daily.  I also hold off on restarting his Imdur.  Lisinopril was discontinued by Dr. Claiborne Billings on 02/15/2019.  By the time he returned to follow-up with Dr. [name], his PCP, his blood pressure has normalized.  He presents today for cardiology office visit, his blood pressure continue to be very normal at 118/66.  He is no longer having significant dizzy spell.  He still occasionally has mild dizziness if he gets up too quickly.  Otherwise he denies any recent chest pain or shortness of breath.  He continues to be fairly active at home however has not been able to play table tennis for a long time.  He is doing well from cardiology perspective, he can follow-up with Dr. Claiborne Billings in 4 months.   Past Medical History:  Diagnosis Date  . CAD 01/29/2009   Qualifier: Diagnosis of  By: [name] MD, [name]    . COPD (chronic obstructive pulmonary disease) ([place]) 12/10/2018  . GOUT 01/29/2009   Qualifier: Diagnosis of  By: [name] MD, [name]    . Gout   . Heart disease    cardiologist Dr Claiborne Billings  . History of MI (myocardial infarction) 09/13/2006   large inferolateral MI secondary to total occlusion of circumflex  . History of nuclear stress test 02/23/2012   exercise myoview; area of scar in inferolateral wall at mid-basal region  . Hyperlipidemia   . INGUINAL HERNIA 05/25/2009   Qualifier: Diagnosis of  By: [name] MD,  [name]      Past Surgical History:  Procedure Laterality Date  . CORONARY STENT PLACEMENT  09/12/2008   r/t MI; 3.5x38mm Xience DES to circumflex & PTCA at multiple sites in distal region of vessel; alos diffusely diseaase, narrowed, nondominant RCA  . FOOT SURGERY  1960  . HERNIA REPAIR  2011   left inguinal hernia  . TRANSTHORACIC ECHOCARDIOGRAM  03/02/2009   [id number]; LV systolic function borderline reduced with mild inferior wall hypokinesis; mild-mod MR; mild TR; AV mildly sclerotic; mild aortic root dilatation    Current Medications: Current Meds  Medication Sig  . allopurinol  (ZYLOPRIM) 300 MG tablet Take 1 tablet by mouth once daily (Patient taking differently: Take 300 mg by mouth daily. )  . aspirin 81 MG tablet Take 81 mg by mouth at bedtime.   . calcium carbonate (TUMS - DOSED IN MG ELEMENTAL CALCIUM) 500 MG chewable tablet Chew 1-2 tablets by mouth as needed for indigestion or heartburn.  . carvedilol (COREG) 12.5 MG tablet Take 12.5 mg by mouth in the morning and 12.5 mg in the evening  . clopidogrel (PLAVIX) 75 MG tablet Take 1 tablet (75 mg total) by mouth daily.  . colchicine 0.6 MG tablet Take 0.6 mg by mouth daily as needed (as directed- for gout flares).   Marland Kitchen desoximetasone (TOPICORT) 0.25 % cream APPLY TWICE A DAY AS DIRECTED  . ezetimibe (ZETIA) 10 MG tablet Take 1 tablet by mouth once daily  . fluticasone (CUTIVATE) 0.05 % cream Apply 1 application topically daily as needed (to affected areas of face).   . Multiple Vitamins-Minerals (ONE-A-DAY MENS 50+ ADVANTAGE) TABS Take 1 tablet by mouth daily.  . nitroGLYCERIN (NITROSTAT) 0.4 MG SL tablet DISSOLVE ONE TABLET UNDER THE TONGUE EVERY 5 MINUTES AS NEEDED FOR CHEST PAIN.  DO NOT EXCEED A TOTAL OF 3 DOSES IN 15 MINUTES  . pantoprazole (PROTONIX) 40 MG tablet Take 1 tablet (40 mg total) by mouth daily as needed.  . simvastatin (ZOCOR) 40 MG tablet Take 1 tablet (40 mg total) by mouth at bedtime.  Marland Kitchen tetrahydrozoline-zinc (VISINE-AC) 0.05-0.25 % ophthalmic solution Place 2 drops into both eyes 3 (three) times daily as needed (for itching ot redness).     Allergies:   Imdur [isosorbide nitrate]   Social History   Socioeconomic History  . Marital status: Single    Spouse name: Not on file  . Number of children: 3  . Years of education: Not on file  . Highest education level: Not on file  Occupational History  . Not on file  Social Needs  . Financial resource strain: Not on file  . Food insecurity    Worry: Not on file    Inability: Not on file  . Transportation needs    Medical: Not on file     Non-medical: Not on file  Tobacco Use  . Smoking status: Current Some Day Smoker    Years: 30.00    Types: Cigarettes  . Smokeless tobacco: Never Used  . Tobacco comment: skip days some; pack last 3 days   Substance and Sexual Activity  . Alcohol use: Yes    Comment: occasional   . Drug use: No  . Sexual activity: Not on file  Lifestyle  . Physical activity    Days per week: Not on file    Minutes per session: Not on file  . Stress: Not on file  Relationships  . Social connections    Talks on phone: Not on file  Gets together: Not on file    Attends religious service: Not on file    Active member of club or organization: Not on file    Attends meetings of clubs or organizations: Not on file    Relationship status: Not on file  Other Topics Concern  . Not on file  Social History Narrative  . Not on file     Family History: The patient's family history includes Cancer in his paternal grandfather; Colon cancer in his father; Heart disease (age of onset: 31) in his brother; Hyperlipidemia in his brother.  ROS:   Please see the history of present illness.     All other systems reviewed and are negative.  EKGs/Labs/Other Studies Reviewed:    The following studies were reviewed today:  Myoview 12/18/2018  Nuclear stress EF: 67%.  Findings consistent with prior myocardial infarction with peri-infarct ischemia.  This is a low risk study.  The left ventricular ejection fraction is hyperdynamic (>65%).   Small inferior wall infarct from apex to base with mild peri infarct ischemia EF preserved 67%    Echo 12/18/2018 IMPRESSIONS  1. The left ventricle has normal systolic function, with an ejection fraction of 55-60%. The cavity size was normal. Left ventricular diastolic parameters were normal.  2. The right ventricle has normal systolic function. The cavity was normal. There is no increase in right ventricular wall thickness.  3. Tricuspid valve regurgitation is  mild-moderate.  4. The aortic valve is tricuspid. Moderate thickening of the aortic valve. Moderate calcification of the aortic valve.  5. There is dilatation at the level of the sinuses of Valsalva measuring 39 mm.   EKG:  EKG is not ordered today.   Recent Labs: 12/09/2018: ALT 19; BUN 16; Creatinine, Ser 0.96; Hemoglobin 13.5; Platelets 140; Potassium 3.9; Sodium 134  Recent Lipid Panel    Component Value Date/Time   CHOL 154 12/10/2018 1230   CHOL 142 03/06/2013 1039   TRIG 30 12/10/2018 1230   TRIG 74 03/06/2013 1039   HDL 87 12/10/2018 1230   HDL 73 03/06/2013 1039   CHOLHDL 1.8 12/10/2018 1230   VLDL 6 12/10/2018 1230   LDLCALC 61 12/10/2018 1230   LDLCALC 54 03/06/2013 1039    Physical Exam:    VS:  BP 118/66   Pulse 62   Ht 5\' 8"  (1.727 m)   Wt 140 lb (63.5 kg)   SpO2 96%   BMI 21.29 kg/m     Wt Readings from Last 3 Encounters:  05/02/19 140 lb (63.5 kg)  04/29/19 140 lb (63.5 kg)  02/15/19 138 lb 6.4 oz (62.8 kg)     GEN:  Well nourished, well developed in no acute distress HEENT: Normal NECK: No JVD; No carotid bruits LYMPHATICS: No lymphadenopathy CARDIAC: RRR, no murmurs, rubs, gallops RESPIRATORY:  Clear to auscultation without rales, wheezing or rhonchi  ABDOMEN: Soft, non-tender, non-distended MUSCULOSKELETAL:  No edema; No deformity  SKIN: Warm and dry NEUROLOGIC:  Alert and oriented x 3 PSYCHIATRIC:  Normal affect   ASSESSMENT:    1. Orthostatic hypotension   2. Coronary artery disease involving native coronary artery of native heart without angina pectoris   3. Hyperlipidemia LDL goal <70    PLAN:    In order of problems listed above:  1. Orthostatic hypotension: Although he still have signs and symptom of occasional orthostatic hypotension, this is greatly improved after his systolic blood pressure improved from the 90s to 110s.  I recommended continue on the current  therapy.  His lisinopril and Imdur were discontinued during previous  office visits.  2. CAD: Last Myoview was obtained in June 2020 that showed scar with peri-infarct ischemia.  Overall low risk study.  He was recommended to manage this symptomatically.  He has not had any further chest pain recently.  Continue aspirin and Plavix  3. Hyperlipidemia: Continue on the current statin therapy include Zetia and Zocor.   Medication Adjustments/Labs and Tests Ordered: Current medicines are reviewed at length with the patient today.  Concerns regarding medicines are outlined above.  No orders of the defined types were placed in this encounter.  No orders of the defined types were placed in this encounter.   Patient Instructions  Medication Instructions:   Your physician recommends that you continue on your current medications as directed. Please refer to the Current Medication list given to you today.  *If you need a refill on your cardiac medications before your next appointment, please call your pharmacy*  Lab Work:  NONE ordered at this time of appointment   If you have labs (blood work) drawn today and your tests are completely normal, you will receive your results only by: Marland Kitchen MyChart Message (if you have MyChart) OR . A paper copy in the mail If you have any lab test that is abnormal or we need to change your treatment, we will call you to review the results.  Testing/Procedures:  NONE ordered at this time of appointment   Follow-Up: At [facility], you and your health needs are our priority.  As part of our continuing mission to provide you with exceptional heart care, we have created designated Provider Care Teams.  These Care Teams include your primary Cardiologist (physician) and Advanced Practice Providers (APPs -  Physician Assistants and Nurse Practitioners) who all work together to provide you with the care you need, when you need it.  Your next appointment:   4 months  The format for your next appointment:   In Person  Provider:    Shelva Majestic, MD  Other Instructions      Signed, Almyra Deforest, [place]  05/02/2019 11:29 AM    [place]

## 2019-05-02 NOTE — Patient Instructions (Addendum)
Medication Instructions:  Your physician recommends that you continue on your current medications as directed. Please refer to the Current Medication list given to you today.  *If you need a refill on your cardiac medications before your next appointment, please call your pharmacy*  Lab Work: NONE ordered at this time of appointment   If you have labs (blood work) drawn today and your tests are completely normal, you will receive your results only by: MyChart Message (if you have MyChart) OR A paper copy in the mail If you have any lab test that is abnormal or we need to change your treatment, we will call you to review the results.  Testing/Procedures: NONE ordered at this time of appointment   Follow-Up: At [facility], you and your health needs are our priority.  As part of our continuing mission to provide you with exceptional heart care, we have created designated Provider Care Teams.  These Care Teams include your primary Cardiologist (physician) and Advanced Practice Providers (APPs -  Physician Assistants and Nurse Practitioners) who all work together to provide you with the care you need, when you need it.  Your next appointment:   4 month(s)  The format for your next appointment:   In Person  Provider:   Thomas Kelly, MD  Other Instructions   

## 2019-05-22 ENCOUNTER — Other Ambulatory Visit: Payer: Self-pay | Admitting: Family Medicine

## 2019-05-24 DIAGNOSIS — Z23 Encounter for immunization: Secondary | ICD-10-CM | POA: Diagnosis not present

## 2019-05-24 NOTE — Telephone Encounter (Signed)
Last OV 04/29/19 Last refill 10/29/18 #90/0 Next OV not scheduled

## 2019-07-01 ENCOUNTER — Telehealth: Payer: Self-pay | Admitting: Family Medicine

## 2019-07-01 NOTE — Telephone Encounter (Signed)
Called patient and gave him the message from Dr. [name]. Patient verbalized an understanding.

## 2019-07-01 NOTE — Telephone Encounter (Signed)
Pt came in the office wanted to see if he if eligible and should get the COVID vaccine due to his age and having a heart condition in the past.  Pt would like to have a call back to the mobile number on file.

## 2019-07-01 NOTE — Telephone Encounter (Signed)
He should definitely consider getting this.

## 2019-07-01 NOTE — Telephone Encounter (Signed)
Please see message. °

## 2019-07-10 ENCOUNTER — Telehealth: Payer: Self-pay | Admitting: Cardiovascular Disease

## 2019-07-10 NOTE — Telephone Encounter (Signed)
We are recommending the COVID-19 vaccine to all of our patients. Cardiac medications (including blood thinners) should not deter anyone from being vaccinated and there is no need to hold any of those medications prior to vaccine administration.     Currently, there is a hotline to call (active 06/28/19) to schedule vaccination appointments as no walk-ins will be accepted.   Number: [phone].    If an appointment is not available please go to [email or web address] to sign up for notification when additional vaccine appointments are available.   If you have further questions or concerns about the vaccine process, please visit [email or web address] or contact your primary care physician.   

## 2019-08-16 ENCOUNTER — Other Ambulatory Visit: Payer: Self-pay | Admitting: Family Medicine

## 2019-08-16 ENCOUNTER — Other Ambulatory Visit: Payer: Self-pay | Admitting: Physician Assistant

## 2019-08-16 ENCOUNTER — Other Ambulatory Visit: Payer: Self-pay | Admitting: Cardiovascular Disease

## 2019-08-19 NOTE — Telephone Encounter (Signed)
Refill okay?  

## 2019-08-27 ENCOUNTER — Other Ambulatory Visit: Payer: Self-pay | Admitting: Family Medicine

## 2019-08-27 ENCOUNTER — Telehealth: Payer: Self-pay

## 2019-08-27 NOTE — Telephone Encounter (Signed)
Pt walked into office without appt. Complaining of having chest pain. Pt states that it was very mild and he took an aspirin earlier and that seemed to help but that " it was starting to come back but it is very light." advised pt that we could call 911 and he could be evaluated in ED, pt stated that he had nitroglycerine at home and opted to try this first. Instructed pt on use of nitro and that it could be taken once every 5 minutes for 15 minutes and that if he continues to have chest pain to call 911 immediately to be evaluated because time is muscle. Pt verbalized understanding and stated that if the nitro did not work he would call 911.  Able to make an OV with Dr.O'Neal tomorrow (08/28/19) at 2:00pm.

## 2019-08-28 ENCOUNTER — Emergency Department (HOSPITAL_COMMUNITY)
Admission: EM | Admit: 2019-08-28 | Discharge: 2019-08-28 | Disposition: A | Discharge status: Home / Self Care | Payer: Medicare Other | Attending: Emergency Medicine | Admitting: Emergency Medicine

## 2019-08-28 ENCOUNTER — Ambulatory Visit (INDEPENDENT_AMBULATORY_CARE_PROVIDER_SITE_OTHER): Payer: Medicare Other | Admitting: Cardiovascular Disease

## 2019-08-28 ENCOUNTER — Encounter (HOSPITAL_COMMUNITY): Payer: Self-pay | Admitting: Emergency Medicine

## 2019-08-28 ENCOUNTER — Emergency Department (HOSPITAL_COMMUNITY): Payer: Medicare Other

## 2019-08-28 ENCOUNTER — Encounter: Payer: Self-pay | Admitting: Cardiovascular Disease

## 2019-08-28 ENCOUNTER — Other Ambulatory Visit: Payer: Self-pay

## 2019-08-28 VITALS — BP 112/74 | HR 66 | Ht 68.0 in | Wt 148.0 lb

## 2019-08-28 DIAGNOSIS — Z7901 Long term (current) use of anticoagulants: Secondary | ICD-10-CM | POA: Diagnosis not present

## 2019-08-28 DIAGNOSIS — I2510 Atherosclerotic heart disease of native coronary artery without angina pectoris: Secondary | ICD-10-CM | POA: Insufficient documentation

## 2019-08-28 DIAGNOSIS — E782 Mixed hyperlipidemia: Secondary | ICD-10-CM | POA: Diagnosis not present

## 2019-08-28 DIAGNOSIS — J449 Chronic obstructive pulmonary disease, unspecified: Secondary | ICD-10-CM | POA: Insufficient documentation

## 2019-08-28 DIAGNOSIS — I252 Old myocardial infarction: Secondary | ICD-10-CM | POA: Insufficient documentation

## 2019-08-28 DIAGNOSIS — R0789 Other chest pain: Secondary | ICD-10-CM | POA: Diagnosis not present

## 2019-08-28 DIAGNOSIS — Z79899 Other long term (current) drug therapy: Secondary | ICD-10-CM | POA: Insufficient documentation

## 2019-08-28 DIAGNOSIS — R079 Chest pain, unspecified: Secondary | ICD-10-CM | POA: Diagnosis not present

## 2019-08-28 DIAGNOSIS — Z7982 Long term (current) use of aspirin: Secondary | ICD-10-CM | POA: Insufficient documentation

## 2019-08-28 DIAGNOSIS — F17210 Nicotine dependence, cigarettes, uncomplicated: Secondary | ICD-10-CM | POA: Insufficient documentation

## 2019-08-28 LAB — BASIC METABOLIC PANEL
Anion gap: 10 (ref 5–15)
BUN: 15 mg/dL (ref 8–23)
CO2: 26 mmol/L (ref 22–32)
Calcium: 9.4 mg/dL (ref 8.9–10.3)
Chloride: 101 mmol/L (ref 98–111)
Creatinine, Ser: 0.97 mg/dL (ref 0.61–1.24)
GFR calc Af Amer: >60 mL/min (ref >60)
GFR calc non Af Amer: >60 mL/min (ref >60)
Glucose, Bld: 112 mg/dL — ABNORMAL HIGH (ref 70–99)
Potassium: 4 mmol/L (ref 3.5–5.1)
Sodium: 137 mmol/L (ref 135–145)

## 2019-08-28 LAB — TROPONIN I (HIGH SENSITIVITY)
Troponin I (High Sensitivity): <2 ng/L (ref <18)
Troponin I (High Sensitivity): <2 ng/L (ref <18)

## 2019-08-28 LAB — CBC
HCT: 42.5 % (ref 39.0–52.0)
Hemoglobin: 14.1 g/dL (ref 13.0–17.0)
MCH: 33.6 pg (ref 26.0–34.0)
MCHC: 33.2 g/dL (ref 30.0–36.0)
MCV: 101.2 fL — ABNORMAL HIGH (ref 80.0–100.0)
Platelets: 143 10*3/uL — ABNORMAL LOW (ref 150–400)
RBC: 4.2 MIL/uL — ABNORMAL LOW (ref 4.22–5.81)
RDW: 13.5 % (ref 11.5–15.5)
WBC: 6.2 10*3/uL (ref 4.0–10.5)
nRBC: 0 % (ref 0.0–0.2)

## 2019-08-28 IMAGING — DX DG CHEST 2V
2 series · 2 of 2 positions shown · non-contrast
Comparison: [DATE].  [DATE].

CLINICAL DATA: Chest pain.

EXAM:
CHEST - 2 VIEW

[chest pa]
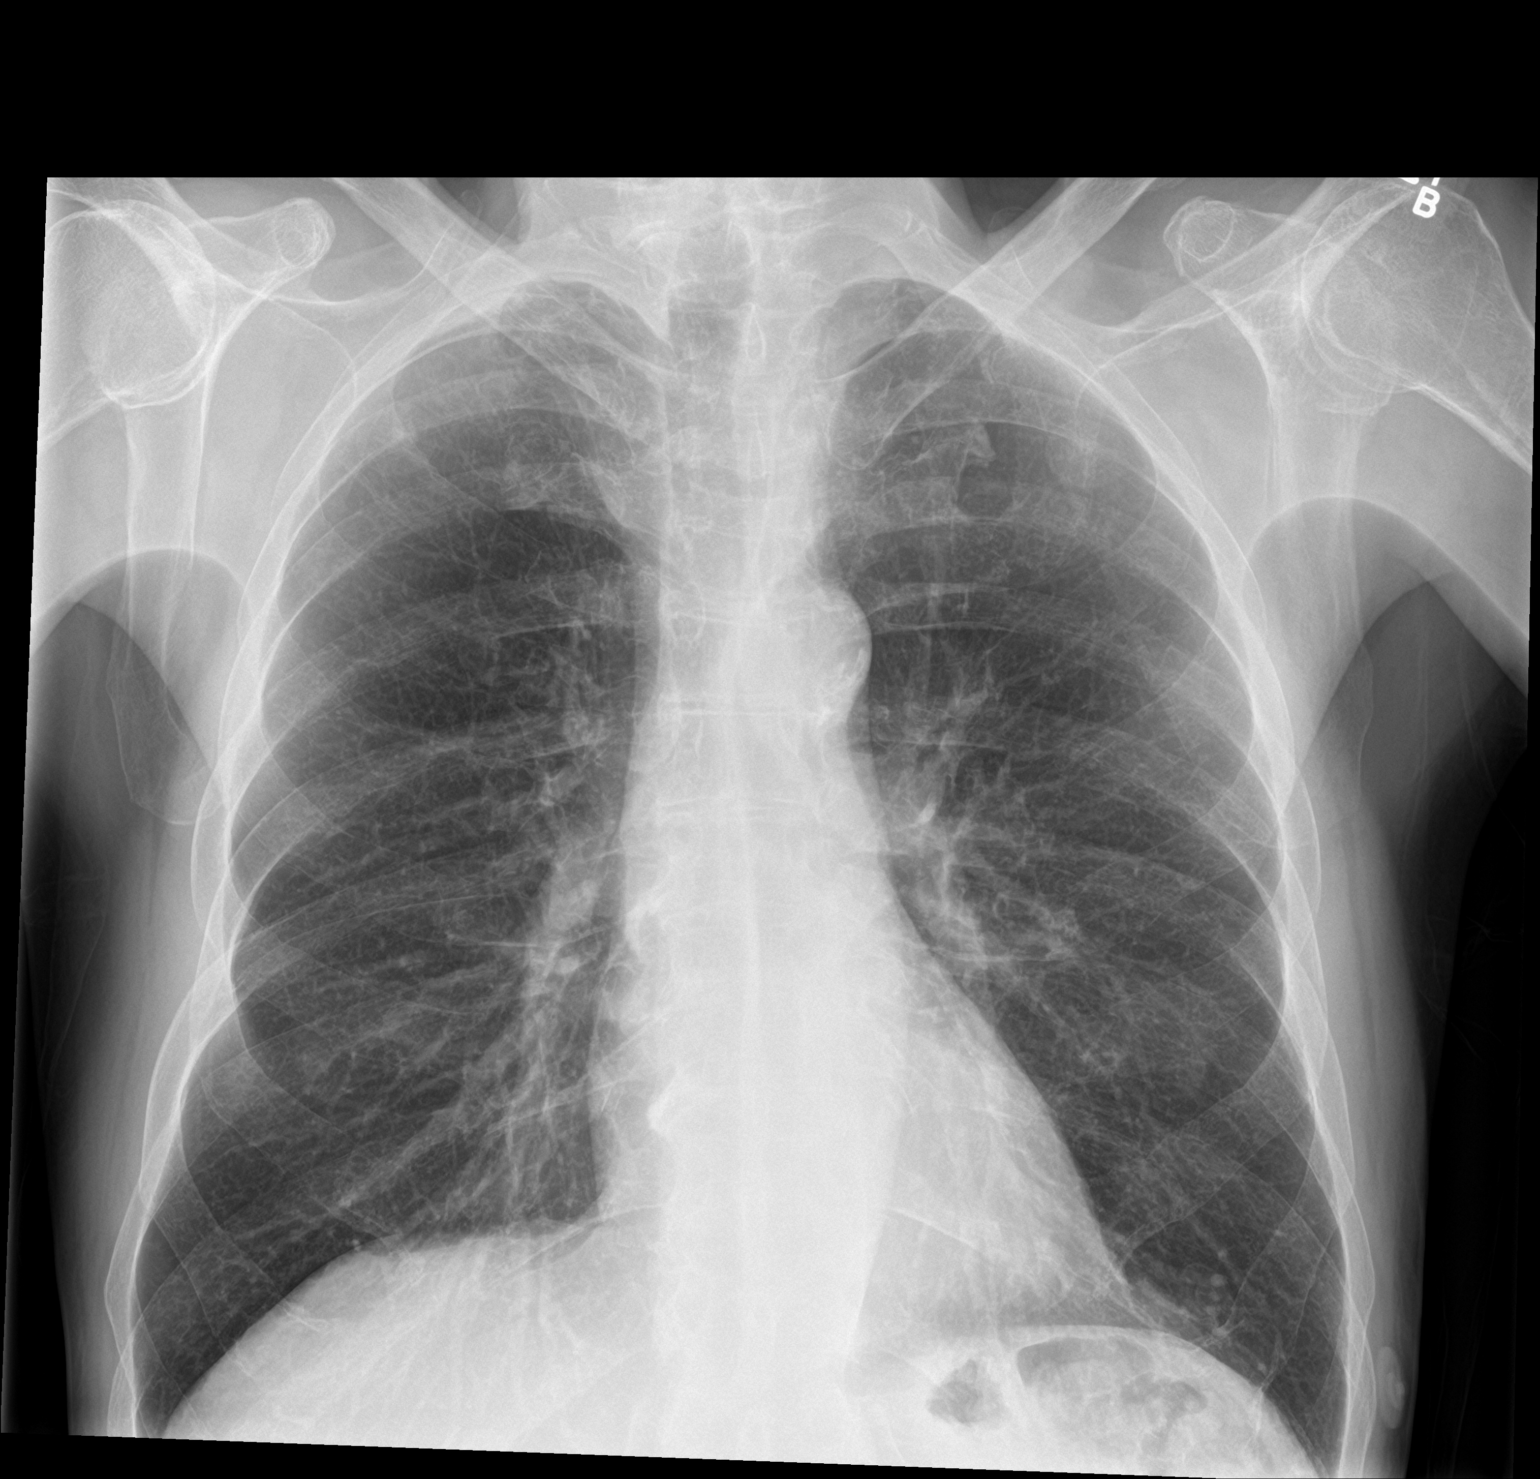

[chest lat]
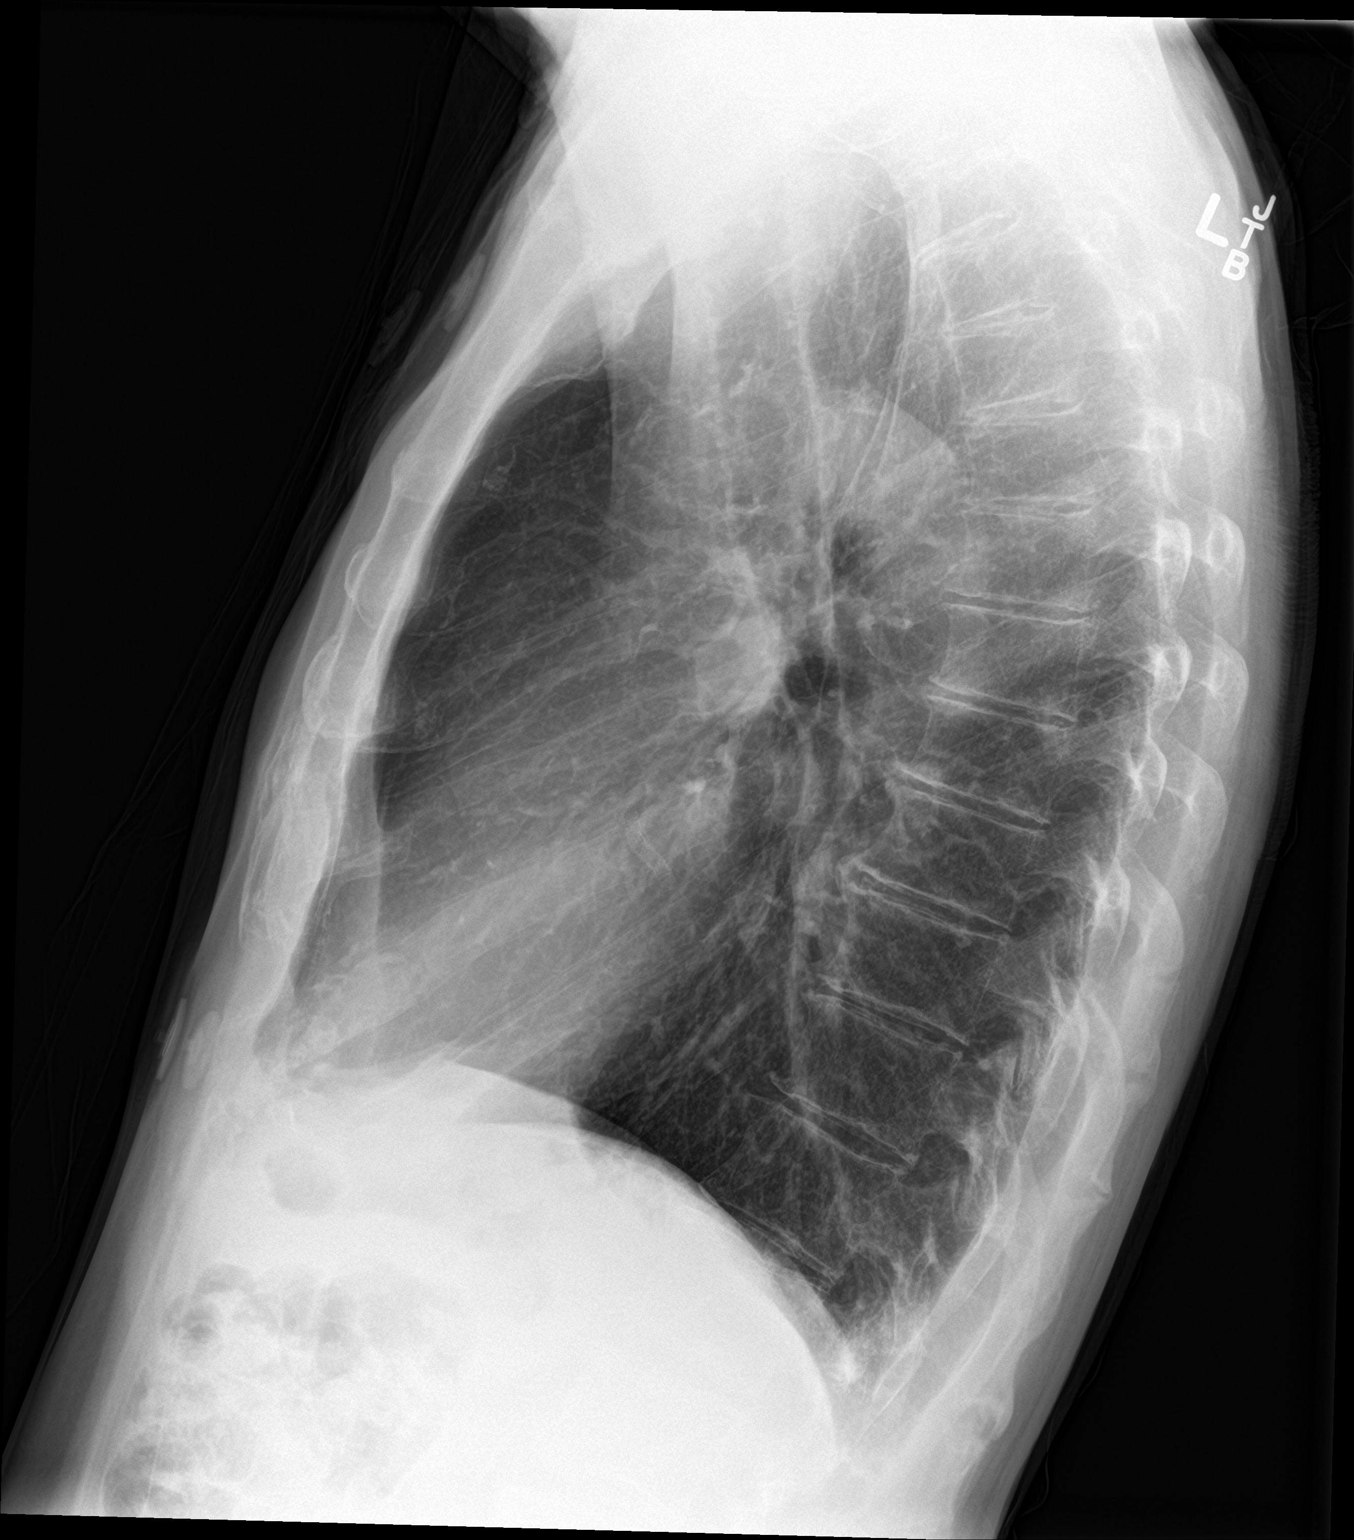

[2 of 2 positions shown; findings below may reference images not displayed]

FINDINGS: Mediastinum and hilar structures normal. Heart size normal. Nipple
shadows again noted. No focal infiltrate. No pleural effusion or
pneumothorax. Diffuse osteopenia. Degenerative change thoracic
spine. Thoracolumbar spine scoliosis and degenerative change.
IMPRESSION: No acute cardiopulmonary disease.

## 2019-08-28 MED ORDER — SODIUM CHLORIDE 0.9% FLUSH: 3.0000 mL | Freq: Once | INTRAVENOUS | Status: DC

## 2019-08-28 NOTE — ED Triage Notes (Signed)
C/o dull pain to center of chest x 2 weeks.  Seen by cardiologist and advised to come to ED for labs.  Denies SOB.

## 2019-08-28 NOTE — ED Provider Notes (Signed)
[facility] EMERGENCY DEPARTMENT Provider Note   CSN: [id number] Arrival date & time: 08/28/19  1524     History Chief Complaint  Patient presents with  . Chest Pain    Jesus Jordan is a 74 y.o. male.  The history is provided by the patient and medical records. No language interpreter was used.  Chest Pain  Jesus Jordan is a 74 y.o. male who presents to the Emergency Department complaining of chest pain.  He presents to the ED complaining of central chest pain described as a tightness/pain.  Sxs are waxing and waning.  No clear alleviating or worsening factors.  Sxs have been ongoing for ten years since he had a stent placed for acute MI but he feels like they been worsening recently. He did see his cardiologist today, who recommended further evaluation in the emergency department.     Denies fevers, cough, sob, leg swelling.  He does feel like he needs to take a deep breath at times.  No known Covid19 exposures.  Lives alone. He is compliant with his home medications.     Past Medical History:  Diagnosis Date  . CAD 01/29/2009   Qualifier: Diagnosis of  By: [name] MD, [name]    . COPD (chronic obstructive pulmonary disease) ([place]) 12/10/2018  . GOUT 01/29/2009   Qualifier: Diagnosis of  By: [name] MD, [name]    . Gout   . Heart disease    cardiologist Dr Claiborne Billings  . History of MI (myocardial infarction) 09/13/2006   large inferolateral MI secondary to total occlusion of circumflex  . History of nuclear stress test 02/23/2012   exercise myoview; area of scar in inferolateral wall at mid-basal region  . Hyperlipidemia   . INGUINAL HERNIA 05/25/2009   Qualifier: Diagnosis of  By: [name] MD, [name]      Patient Active Problem List   Diagnosis Date Noted  . COPD (chronic obstructive pulmonary disease) ([place]) 12/10/2018  . Essential hypertension 12/10/2018  . Chest pain 12/09/2018  . GERD (gastroesophageal reflux disease) 04/18/2018  . CAD in native artery  05/03/2016  . Old MI (myocardial infarction) 03/06/2013  . Hyperlipidemia with target LDL less than 70 03/06/2013  . Inguinal hernia 05/25/2009  . Gout 01/29/2009  . Coronary atherosclerosis 01/29/2009    Past Surgical History:  Procedure Laterality Date  . CORONARY STENT PLACEMENT  09/12/2008   r/t MI; 3.5x4mm Xience DES to circumflex & PTCA at multiple sites in distal region of vessel; alos diffusely diseaase, narrowed, nondominant RCA  . FOOT SURGERY  1960  . HERNIA REPAIR  2011   left inguinal hernia  . TRANSTHORACIC ECHOCARDIOGRAM  03/02/2009   [id number]; LV systolic function borderline reduced with mild inferior wall hypokinesis; mild-mod MR; mild TR; AV mildly sclerotic; mild aortic root dilatation       Family History  Problem Relation Age of Onset  . Colon cancer Father   . Hyperlipidemia Brother   . Heart disease Brother 93       massive MI  . Cancer Paternal Grandfather        colon    Social History   Tobacco Use  . Smoking status: Current Some Day Smoker    Years: 30.00    Types: Cigarettes  . Smokeless tobacco: Never Used  . Tobacco comment: skip days some; pack last 3 days   Substance Use Topics  . Alcohol use: Yes    Comment: occasional   . Drug use: No    Home  Medications Prior to Admission medications   Medication Sig Start Date End Date Taking? Authorizing Provider  allopurinol (ZYLOPRIM) 300 MG tablet Take 1 tablet by mouth once daily Patient taking differently: Take 300 mg by mouth daily.  08/27/19  Yes Burchette, Alinda Sierras, MD  aspirin 81 MG tablet Take 81 mg by mouth at bedtime.    Yes [provider]  calcium carbonate (TUMS - DOSED IN MG ELEMENTAL CALCIUM) 500 MG chewable tablet Chew 1-2 tablets by mouth as needed for indigestion or heartburn.   Yes [provider]  carvedilol (COREG) 12.5 MG tablet Take 12.5 mg by mouth in the morning and 12.5 mg in the evening 12/10/18  Yes Rama, Venetia Maxon, MD  clopidogrel (PLAVIX) 75 MG  tablet Take 1 tablet by mouth once daily Patient taking differently: Take 75 mg by mouth daily.  08/19/19  Yes Troy Sine, MD  desoximetasone (TOPICORT) 0.25 % cream APPLY TWICE A DAY AS DIRECTED Patient taking differently: Apply 1 application topically See admin instructions. Apply to affected areas of face 2 times a day as directed 08/19/19  Yes Burchette, Alinda Sierras, MD  ezetimibe (ZETIA) 10 MG tablet Take 1 tablet by mouth once daily Patient taking differently: Take 10 mg by mouth at bedtime.  08/19/19  Yes Almyra Deforest, PA  fluticasone (CUTIVATE) 0.05 % cream Apply 1 application topically daily as needed (to affected areas of face).  02/12/13  Yes [provider]  Multiple Vitamins-Minerals (ONE-A-DAY MENS 50+ ADVANTAGE) TABS Take 1 tablet by mouth 3 (three) times a week.    Yes [provider]  nitroGLYCERIN (NITROSTAT) 0.4 MG SL tablet DISSOLVE ONE TABLET UNDER THE TONGUE EVERY 5 MINUTES AS NEEDED FOR CHEST PAIN.  DO NOT EXCEED A TOTAL OF 3 DOSES IN 15 MINUTES Patient taking differently: Place 0.4 mg under the tongue every 5 (five) minutes x 3 doses as needed for chest pain.  12/10/18  Yes Rama, Venetia Maxon, MD  pantoprazole (PROTONIX) 40 MG tablet Take 1 tablet (40 mg total) by mouth daily as needed. Patient taking differently: Take 40 mg by mouth daily as needed (for GERD).  12/10/18  Yes Rama, Venetia Maxon, MD  simvastatin (ZOCOR) 40 MG tablet TAKE 1 TABLET BY MOUTH AT BEDTIME Patient taking differently: Take 40 mg by mouth at bedtime.  08/19/19  Yes Troy Sine, MD  tetrahydrozoline-zinc (VISINE-AC) 0.05-0.25 % ophthalmic solution Place 2 drops into both eyes 3 (three) times daily as needed (for itching ot redness).   Yes [provider]    Allergies    Imdur [isosorbide nitrate] and Lisinopril  Review of Systems   Review of Systems  Cardiovascular: Positive for chest pain.  All other systems reviewed and are negative.   Physical Exam Updated Vital Signs BP  127/83   Pulse 70   Temp 98.5 F (36.9 C) (Oral)   Resp 20   SpO2 100%   Physical Exam Vitals and nursing note reviewed.  Constitutional:      Appearance: He is well-developed.  HENT:     Head: Normocephalic and atraumatic.  Cardiovascular:     Rate and Rhythm: Normal rate and regular rhythm.     Heart sounds: No murmur.  Pulmonary:     Effort: Pulmonary effort is normal. No respiratory distress.     Breath sounds: Normal breath sounds.  Abdominal:     Palpations: Abdomen is soft.     Tenderness: There is no abdominal tenderness. There is no guarding or  rebound.  Musculoskeletal:        General: No swelling or tenderness.  Skin:    General: Skin is warm and dry.  Neurological:     Mental Status: He is alert and oriented to person, place, and time.  Psychiatric:        Behavior: Behavior normal.     ED Results / Procedures / Treatments   Labs (all labs ordered are listed, but only abnormal results are displayed) Labs Reviewed  BASIC METABOLIC PANEL - Abnormal; Notable for the following components:      Result Value   Glucose, Bld 112 (*)    All other components within normal limits  CBC - Abnormal; Notable for the following components:   RBC 4.20 (*)    MCV 101.2 (*)    Platelets 143 (*)    All other components within normal limits  TROPONIN I (HIGH SENSITIVITY)  TROPONIN I (HIGH SENSITIVITY)    EKG EKG Interpretation  Date/Time:  Wednesday August 28 2019 15:34:14 EST Ventricular Rate:  64 PR Interval:  202 QRS Duration: 74 QT Interval:  406 QTC Calculation: 418 R Axis:   73 Text Interpretation: Normal sinus rhythm Normal ECG Confirmed by Quintella Reichert [id number]) on 08/28/2019 5:35:52 PM   Radiology DG Chest 2 View  Result Date: 08/28/2019 CLINICAL DATA:  Chest pain. EXAM: CHEST - 2 VIEW COMPARISON:  12/09/2018.  03/21/2016. FINDINGS: Mediastinum and hilar structures normal. Heart size normal. Nipple shadows again noted. No focal infiltrate. No pleural  effusion or pneumothorax. Diffuse osteopenia. Degenerative change thoracic spine. Thoracolumbar spine scoliosis and degenerative change. IMPRESSION: No acute cardiopulmonary disease. Electronically Signed   By: Marcello Moores  Register   On: 08/28/2019 16:35    Procedures Procedures (including critical care time)  Medications Ordered in ED Medications  sodium chloride flush (NS) 0.9 % injection 3 mL (has no administration in time range)    ED Course  I have reviewed the triage vital signs and the nursing notes.  Pertinent labs & imaging results that were available during my care of the patient were reviewed by me and considered in my medical decision making (see chart for details).    MDM Rules/Calculators/A&P                     Patient here for evaluation of intermittent chest pain. He does have a history of prior coronary artery disease. Pain is not consistently reproducible with activity. EKG is without acute ischemic changes. Troponin is negative times two. Current presentation is not consistent with ACS or unstable angina, PE, dissection. Discussed with patient home care for chest pain. Discussed outpatient cardiology follow-up and return precautions.  Final Clinical Impression(s) / ED Diagnoses Final diagnoses:  Nonspecific chest pain    Rx / DC Orders ED Discharge Orders    None       Quintella Reichert, MD 08/28/19 2132

## 2019-08-28 NOTE — Progress Notes (Signed)
Cardiology Office Note:   Date:  08/28/2019  NAME:  Jesus Jordan    MRN: [id number] DOB:  08/17/1945   PCP:  Eulas Post, MD  Cardiologist:  Shelva Majestic, MD   Referring MD: Eulas Post, MD   Chief Complaint  Patient presents with  . Chest Pain   History of Present Illness:   Jesus Jordan is a 74 y.o. male with a hx of CAD s/p Inferolateral MI c/b VF arrest, HLD who presents for the evaluation of chest pain.  He presents for evaluation of intermittent left-sided chest pain for the past 10 days.  The pain occurs several times per day.  It can occur at rest or with exertion.  He reports has had intermittent sharp and dull pain in the left chest.  He reports that he has taken nitroglycerin intermittently and it has helped.  He reports he has 1 out of 10 chest pain in the office today.  His EKG today shows normal sinus rhythm without acute ischemic changes.  He reports no associated shortness of breath and no limitations with physical activity.  The pain does not appear to be worsened by activity or alleviated by rest.  He does report he feels quite depressed and stressed lately.  His daughter is on the phone during our examination and reports that he is extremely stressed out as of lately.  It appears the activities of the news and the coronavirus pandemic have him severely stressed.  Problem List 1. Inferolateral STEMI c/b VF arrest  -PCI LCX, normal LAD, 90% non-dominant RCA 2. HLD -T chol 154, HDL 87, LDL 61  Past Medical History: Past Medical History:  Diagnosis Date  . CAD 01/29/2009   Qualifier: Diagnosis of  By: [name] MD, [name]    . COPD (chronic obstructive pulmonary disease) ([place]) 12/10/2018  . GOUT 01/29/2009   Qualifier: Diagnosis of  By: [name] MD, [name]    . Gout   . Heart disease    cardiologist Dr Claiborne Billings  . History of MI (myocardial infarction) 09/13/2006   large inferolateral MI secondary to total occlusion of circumflex  . History of nuclear stress test  02/23/2012   exercise myoview; area of scar in inferolateral wall at mid-basal region  . Hyperlipidemia   . INGUINAL HERNIA 05/25/2009   Qualifier: Diagnosis of  By: [name] MD, [name]      Past Surgical History: Past Surgical History:  Procedure Laterality Date  . CORONARY STENT PLACEMENT  09/12/2008   r/t MI; 3.5x2mm Xience DES to circumflex & PTCA at multiple sites in distal region of vessel; alos diffusely diseaase, narrowed, nondominant RCA  . FOOT SURGERY  1960  . HERNIA REPAIR  2011   left inguinal hernia  . TRANSTHORACIC ECHOCARDIOGRAM  03/02/2009   [id number]; LV systolic function borderline reduced with mild inferior wall hypokinesis; mild-mod MR; mild TR; AV mildly sclerotic; mild aortic root dilatation    Current Medications: Current Meds  Medication Sig  . allopurinol (ZYLOPRIM) 300 MG tablet Take 1 tablet by mouth once daily  . aspirin 81 MG tablet Take 81 mg by mouth at bedtime.   . calcium carbonate (TUMS - DOSED IN MG ELEMENTAL CALCIUM) 500 MG chewable tablet Chew 1-2 tablets by mouth as needed for indigestion or heartburn.  . carvedilol (COREG) 12.5 MG tablet Take 12.5 mg by mouth in the morning and 12.5 mg in the evening  . clopidogrel (PLAVIX) 75 MG tablet Take 1 tablet by mouth once daily  . colchicine  0.6 MG tablet Take 0.6 mg by mouth daily as needed (as directed- for gout flares).   Marland Kitchen desoximetasone (TOPICORT) 0.25 % cream APPLY TWICE A DAY AS DIRECTED  . ezetimibe (ZETIA) 10 MG tablet Take 1 tablet by mouth once daily  . fluticasone (CUTIVATE) 0.05 % cream Apply 1 application topically daily as needed (to affected areas of face).   . Multiple Vitamins-Minerals (ONE-A-DAY MENS 50+ ADVANTAGE) TABS Take 1 tablet by mouth daily.  . nitroGLYCERIN (NITROSTAT) 0.4 MG SL tablet DISSOLVE ONE TABLET UNDER THE TONGUE EVERY 5 MINUTES AS NEEDED FOR CHEST PAIN.  DO NOT EXCEED A TOTAL OF 3 DOSES IN 15 MINUTES  . pantoprazole (PROTONIX) 40 MG tablet Take 1 tablet (40 mg total)  by mouth daily as needed.  . simvastatin (ZOCOR) 40 MG tablet TAKE 1 TABLET BY MOUTH AT BEDTIME  . tetrahydrozoline-zinc (VISINE-AC) 0.05-0.25 % ophthalmic solution Place 2 drops into both eyes 3 (three) times daily as needed (for itching ot redness).     Allergies:    Imdur [isosorbide nitrate]   Social History: Social History   Socioeconomic History  . Marital status: Single    Spouse name: Not on file  . Number of children: 3  . Years of education: Not on file  . Highest education level: Not on file  Occupational History  . Not on file  Tobacco Use  . Smoking status: Current Some Day Smoker    Years: 30.00    Types: Cigarettes  . Smokeless tobacco: Never Used  . Tobacco comment: skip days some; pack last 3 days   Substance and Sexual Activity  . Alcohol use: Yes    Comment: occasional   . Drug use: No  . Sexual activity: Not on file  Other Topics Concern  . Not on file  Social History Narrative  . Not on file   Social Determinants of Health   Financial Resource Strain:   . Difficulty of Paying Living Expenses: Not on file  Food Insecurity:   . Worried About Charity fundraiser in the Last Year: Not on file  . Ran Out of Food in the Last Year: Not on file  Transportation Needs:   . Lack of Transportation (Medical): Not on file  . Lack of Transportation (Non-Medical): Not on file  Physical Activity:   . Days of Exercise per Week: Not on file  . Minutes of Exercise per Session: Not on file  Stress:   . Feeling of Stress : Not on file  Social Connections:   . Frequency of Communication with Friends and Family: Not on file  . Frequency of Social Gatherings with Friends and Family: Not on file  . Attends Religious Services: Not on file  . Active Member of Clubs or Organizations: Not on file  . Attends Archivist Meetings: Not on file  . Marital Status: Not on file     Family History: The patient's family history includes Cancer in his paternal  grandfather; Colon cancer in his father; Heart disease (age of onset: 56) in his brother; Hyperlipidemia in his brother.  ROS:   All other ROS reviewed and negative. Pertinent positives noted in the HPI.     EKGs/Labs/Other Studies Reviewed:   The following studies were personally reviewed by me today:  EKG:  EKG is ordered today.  The ekg ordered today demonstrates normal sinus rhythm, heart rate 66, no acute ST-T changes, no evidence of prior infarction, and was personally reviewed by me.  NM Stress 12/18/2018  Nuclear stress EF: 67%.  Findings consistent with prior myocardial infarction with peri-infarct ischemia.  This is a low risk study.  The left ventricular ejection fraction is hyperdynamic (>65%).   Small inferior wall infarct from apex to base with mild peri infarct ischemia EF preserved 67%  Echo 12/18/2018 1. The left ventricle has normal systolic function, with an ejection  fraction of 55-60%. The cavity size was normal. Left ventricular diastolic  parameters were normal.  2. The right ventricle has normal systolic function. The cavity was  normal. There is no increase in right ventricular wall thickness.  3. Tricuspid valve regurgitation is mild-moderate.  4. The aortic valve is tricuspid. Moderate thickening of the aortic  valve. Moderate calcification of the aortic valve.  5. There is dilatation at the level of the sinuses of Valsalva measuring  39 mm.   Recent Labs: 12/09/2018: ALT 19; BUN 16; Creatinine, Ser 0.96; Hemoglobin 13.5; Platelets 140; Potassium 3.9; Sodium 134   Recent Lipid Panel    Component Value Date/Time   CHOL 154 12/10/2018 1230   CHOL 142 03/06/2013 1039   TRIG 30 12/10/2018 1230   TRIG 74 03/06/2013 1039   HDL 87 12/10/2018 1230   HDL 73 03/06/2013 1039   CHOLHDL 1.8 12/10/2018 1230   VLDL 6 12/10/2018 1230   LDLCALC 61 12/10/2018 1230   LDLCALC 54 03/06/2013 1039    Physical Exam:   VS:  BP 112/74   Pulse 66   Ht 5\' 8"   (1.727 m)   Wt 148 lb (67.1 kg)   SpO2 99%   BMI 22.50 kg/m    Wt Readings from Last 3 Encounters:  08/28/19 148 lb (67.1 kg)  05/02/19 140 lb (63.5 kg)  04/29/19 140 lb (63.5 kg)    General: Well nourished, well developed, in no acute distress Heart: Atraumatic, normal size  Eyes: PEERLA, EOMI  Neck: Supple, no JVD Endocrine: No thryomegaly Cardiac: Normal S1, S2; RRR; no murmurs, rubs, or gallops Lungs: Clear to auscultation bilaterally, no wheezing, rhonchi or rales  Abd: Soft, nontender, no hepatomegaly  Ext: No edema, pulses 2+ Musculoskeletal: No deformities, BUE and BLE strength normal and equal Skin: Warm and dry, no rashes   Neuro: Alert and oriented to person, place, time, and situation, CNII-XII grossly intact, no focal deficits  Psych: Normal mood and affect   ASSESSMENT:   Shaymus Pepple is a 74 y.o. male who presents for the following: 1. Other chest pain   2. Coronary artery disease involving native coronary artery of native heart without angina pectoris   3. Mixed hyperlipidemia     PLAN:   1. Other chest pain -He presents with 10 days of atypical left-sided chest pain.  The pain is reported as sharp and dull occurring intermittently without identifiable trigger.  He does report that nitroglycerin has helped.  His EKG today shows normal sinus rhythm without acute ischemic changes.  I discussed with him and his daughter that this is likely noncardiac chest pain.  Given that he still having chest pain and his prior history of CAD and VF arrest she would like to definitively know that he is not having a heart attack.  I informed her that the only way to do this would be to go to the emergency room and obtain cardiac enzymes.  Due to the persistent pain that seems to not gotten better, he will go to the emergency room to have troponin values drawn.  This is only  way to definitively exclude an acute coronary syndrome.  If his cardiac enzymes are negative he will then be able  to follow-up routinely with Dr. Ellouise Newer.  She reports that since the pain has gone on for 10 days she would feel better if he would do this.  He almost went yesterday but decided to wait until he was seen by cardiologist today.  I think the only thing we can do is to have him present to the emergency room for serial cardiac enzymes. -Of note, he had a similar visit in June where he presented to the emergency room.  Cardiac enzymes were negative and he did follow-up with a stress test which showed old inferolateral infarction with peri-infarct ischemia that was unchanged from prior study.  2. Coronary artery disease involving native coronary artery of native heart without angina pectoris -Status post VF arrest in 2010 with PCI to left circumflex -He will remain on aspirin, statin, Plavix.  Blood pressure capital today.  No changes in medications.  3. Mixed hyperlipidemia -He will continue his Zocor.  Disposition: Return if symptoms worsen or fail to improve.  Medication Adjustments/Labs and Tests Ordered: Current medicines are reviewed at length with the patient today.  Concerns regarding medicines are outlined above.  Orders Placed This Encounter  Procedures  . EKG 12-Lead   No orders of the defined types were placed in this encounter.   Patient Instructions  Medication Instructions:  The current medical regimen is effective;  continue present plan and medications.  *If you need a refill on your cardiac medications before your next appointment, please call your pharmacy*    Follow-Up: At [facility], you and your health needs are our priority.  As part of our continuing mission to provide you with exceptional heart care, we have created designated Provider Care Teams.  These Care Teams include your primary Cardiologist (physician) and Advanced Practice Providers (APPs -  Physician Assistants and Nurse Practitioners) who all work together to provide you with the care you need, when  you need it.  We recommend signing up for the patient portal called "MyChart".  Sign up information is provided on this After Visit Summary.  MyChart is used to connect with patients for Virtual Visits (Telemedicine).  Patients are able to view lab/test results, encounter notes, upcoming appointments, etc.  Non-urgent messages can be sent to your provider as well.   To learn more about what you can do with MyChart, go to [email or web address].    Your next appointment:   09/10/19  Provider:   Shelva Majestic, MD        Time Spent with Patient: I have spent a total of 25 minutes with patient reviewing hospital notes, telemetry, EKGs, labs and examining the patient as well as establishing an assessment and plan that was discussed with the patient.  > 50% of time was spent in direct patient care.  Signed, Addison Naegeli. Audie Box, [place]  [address], [place], [place] [address] [phone]  08/28/2019 2:43 PM

## 2019-08-28 NOTE — Patient Instructions (Signed)
Medication Instructions:  The current medical regimen is effective;  continue present plan and medications.  *If you need a refill on your cardiac medications before your next appointment, please call your pharmacy*    Follow-Up: At [facility], you and your health needs are our priority.  As part of our continuing mission to provide you with exceptional heart care, we have created designated Provider Care Teams.  These Care Teams include your primary Cardiologist (physician) and Advanced Practice Providers (APPs -  Physician Assistants and Nurse Practitioners) who all work together to provide you with the care you need, when you need it.  We recommend signing up for the patient portal called "MyChart".  Sign up information is provided on this After Visit Summary.  MyChart is used to connect with patients for Virtual Visits (Telemedicine).  Patients are able to view lab/test results, encounter notes, upcoming appointments, etc.  Non-urgent messages can be sent to your provider as well.   To learn more about what you can do with MyChart, go to [email or web address].    Your next appointment:   09/10/19  Provider:   Shelva Majestic, MD

## 2019-08-30 ENCOUNTER — Other Ambulatory Visit: Payer: Self-pay | Admitting: Cardiovascular Disease

## 2019-09-03 ENCOUNTER — Ambulatory Visit (INDEPENDENT_AMBULATORY_CARE_PROVIDER_SITE_OTHER): Payer: Medicare Other | Admitting: Family Medicine

## 2019-09-03 ENCOUNTER — Encounter: Payer: Self-pay | Admitting: Family Medicine

## 2019-09-03 VITALS — BP 122/78 | HR 72 | Temp 98.3°F | Wt 145.0 lb

## 2019-09-03 DIAGNOSIS — I2510 Atherosclerotic heart disease of native coronary artery without angina pectoris: Secondary | ICD-10-CM | POA: Diagnosis not present

## 2019-09-03 DIAGNOSIS — R079 Chest pain, unspecified: Secondary | ICD-10-CM

## 2019-09-03 DIAGNOSIS — F321 Major depressive disorder, single episode, moderate: Secondary | ICD-10-CM | POA: Diagnosis not present

## 2019-09-03 MED ORDER — MIRTAZAPINE 15 MG PO TABS: 15.0000 mg | ORAL_TABLET | Freq: Every day | ORAL | 5 refills | Status: DC

## 2019-09-03 NOTE — Progress Notes (Signed)
Subjective:     Patient ID: Jesus Jordan, male   DOB: 1945-08-30, 74 y.o.   MRN: [id number]  HPI   Jesus Jordan has history of CAD, hypertension, GERD, gout, hyperlipidemia.  He is followed by cardiology.  He was seen by cardiology on 10 March with chest pain.  His EKG was unremarkable.  He had 10 days of intermittent chest pain.  He was referred to the ER for further evaluation.  He had taken a nitroglycerin he thinks prior to presentation to the ER with possibly some mild relief in symptoms.  He described a dull substernal chest pain.  Troponins were negative.  He states he has not had any chest pain since then.  He had nuclear stress test back in June 2020 which showed no ischemia.  He has follow-up with cardiology next week.  Currently pain-free.  He had a couple brothers who have died of MI and basically all of his siblings have had coronary artery disease.  This weighs on him heavily.  We had discussed stress and anxiety issues and depression issues previously.  He had some poor appetite for really a few years now along with difficulty sleeping.  At 1 point we discussed possible trial of mirtazapine and never got prescription filled.  He would like to reconsider that now.  He does have some increased depression symptoms but denies any suicidal ideation  Past Medical History:  Diagnosis Date  . CAD 01/29/2009   Qualifier: Diagnosis of  By: Jesus Hashimoto MD, Jesus Jordan    . COPD (chronic obstructive pulmonary disease) ([place]) 12/10/2018  . GOUT 01/29/2009   Qualifier: Diagnosis of  By: Jesus Hashimoto MD, Jesus Jordan    . Gout   . Heart disease    cardiologist Dr Jesus Jordan  . History of MI (myocardial infarction) 09/13/2006   large inferolateral MI secondary to total occlusion of circumflex  . History of nuclear stress test 02/23/2012   exercise myoview; area of scar in inferolateral wall at mid-basal region  . Hyperlipidemia   . INGUINAL HERNIA 05/25/2009   Qualifier: Diagnosis of  By: Jesus Hashimoto MD, Jesus Jordan     Past Surgical  History:  Procedure Laterality Date  . CORONARY STENT PLACEMENT  09/12/2008   r/t MI; 3.5x29mm Xience DES to circumflex & PTCA at multiple sites in distal region of vessel; alos diffusely diseaase, narrowed, nondominant RCA  . FOOT SURGERY  1960  . HERNIA REPAIR  2011   left inguinal hernia  . TRANSTHORACIC ECHOCARDIOGRAM  03/02/2009   [id number]; LV systolic function borderline reduced with mild inferior wall hypokinesis; mild-mod MR; mild TR; AV mildly sclerotic; mild aortic root dilatation    reports that he has been smoking cigarettes. He has smoked for the past 30.00 years. He has never used smokeless tobacco. He reports current alcohol use. He reports that he does not use drugs. family history includes Cancer in his paternal grandfather; Colon cancer in his father; Heart disease (age of onset: 53) in his brother; Hyperlipidemia in his brother. Allergies  Allergen Reactions  . Imdur [Isosorbide Nitrate] Other (See Comments)    Caused vertigo  . Lisinopril Other (See Comments)    Possible vertigo??     Review of Systems  Respiratory: Negative for shortness of breath.   Cardiovascular: Negative for chest pain.  Gastrointestinal: Negative for abdominal pain.  Neurological: Negative for dizziness.       Objective:   Physical Exam Vitals reviewed.  Constitutional:      Appearance: Normal appearance.  Cardiovascular:  Rate and Rhythm: Normal rate and regular rhythm.  Pulmonary:     Effort: Pulmonary effort is normal.     Breath sounds: Normal breath sounds.  Neurological:     Mental Status: He is alert.  Psychiatric:     Comments: PHQ-9 score equals 20        Assessment:     #1 history of CAD.  He has had some recent chest pain at rest and had ER evaluation as above with no evidence for acute coronary syndrome.  #2 depression symptoms.  Has been battling this really for a few years and has very high PHQ-9 score of 20.  No suicidal ideation.  He has struggled with  poor appetite and sleep disturbance for some time which may be related     Plan:     -We discussed trial of mirtazapine 15 mg nightly -Recommend office follow-up in 3 to 4 weeks to reassess -Continue  close follow-up with cardiology  Jesus Post MD [facility]

## 2019-09-03 NOTE — Patient Instructions (Signed)
Living With Depression Everyone experiences occasional disappointment, sadness, and loss in their lives. When you are feeling down, blue, or sad for at least 2 weeks in a row, it may mean that you have depression. Depression can affect your thoughts and feelings, relationships, daily activities, and physical health. It is caused by changes in the way your brain functions. If you receive a diagnosis of depression, your health care provider will tell you which type of depression you have and what treatment options are available to you. If you are living with depression, there are ways to help you recover from it and also ways to prevent it from coming back. How to cope with lifestyle changes Coping with stress     Stress is your body's reaction to life changes and events, both good and bad. Stressful situations may include:  Getting married.  The death of a spouse.  Losing a job.  Retiring.  Having a baby. Stress can last just a few hours or it can be ongoing. Stress can play a major role in depression, so it is important to learn both how to cope with stress and how to think about it differently. Talk with your health care provider or a counselor if you would like to learn more about stress reduction. He or she may suggest some stress reduction techniques, such as:  Music therapy. This can include creating music or listening to music. Choose music that you enjoy and that inspires you.  Mindfulness-based meditation. This kind of meditation can be done while sitting or walking. It involves being aware of your normal breaths, rather than trying to control your breathing.  Centering prayer. This is a kind of meditation that involves focusing on a spiritual word or phrase. Choose a word, phrase, or sacred image that is meaningful to you and that brings you peace.  Deep breathing. To do this, expand your stomach and inhale slowly through your nose. Hold your breath for 3-5 seconds, then exhale  slowly, allowing your stomach muscles to relax.  Muscle relaxation. This involves intentionally tensing muscles then relaxing them. Choose a stress reduction technique that fits your lifestyle and personality. Stress reduction techniques take time and practice to develop. Set aside 5-15 minutes a day to do them. Therapists can offer training in these techniques. The training may be covered by some insurance plans. Other things you can do to manage stress include:  Keeping a stress diary. This can help you learn what triggers your stress and ways to control your response.  Understanding what your limits are and saying no to requests or events that lead to a schedule that is too full.  Thinking about how you respond to certain situations. You may not be able to control everything, but you can control how you react.  Adding humor to your life by watching funny films or TV shows.  Making time for activities that help you relax and not feeling guilty about spending your time this way.  Medicines Your health care provider may suggest certain medicines if he or she feels that they will help improve your condition. Avoid using alcohol and other substances that may prevent your medicines from working properly (may interact). It is also important to:  Talk with your pharmacist or health care provider about all the medicines that you take, their possible side effects, and what medicines are safe to take together.  Make it your goal to take part in all treatment decisions (shared decision-making). This includes giving input on   the side effects of medicines. It is best if shared decision-making with your health care provider is part of your total treatment plan. If your health care provider prescribes a medicine, you may not notice the full benefits of it for 4-8 weeks. Most people who are treated for depression need to be on medicine for at least 6-12 months after they feel better. If you are taking  medicines as part of your treatment, do not stop taking medicines without first talking to your health care provider. You may need to have the medicine slowly decreased (tapered) over time to decrease the risk of harmful side effects. Relationships Your health care provider may suggest family therapy along with individual therapy and drug therapy. While there may not be family problems that are causing you to feel depressed, it is still important to make sure your family learns as much as they can about your mental health. Having your family's support can help make your treatment successful. How to recognize changes in your condition Everyone has a different response to treatment for depression. Recovery from major depression happens when you have not had signs of major depression for two months. This may mean that you will start to:  Have more interest in doing activities.  Feel less hopeless than you did 2 months ago.  Have more energy.  Overeat less often, or have better or improving appetite.  Have better concentration. Your health care provider will work with you to decide the next steps in your recovery. It is also important to recognize when your condition is getting worse. Watch for these signs:  Having fatigue or low energy.  Eating too much or too little.  Sleeping too much or too little.  Feeling restless, agitated, or hopeless.  Having trouble concentrating or making decisions.  Having unexplained physical complaints.  Feeling irritable, angry, or aggressive. Get help as soon as you or your family members notice these symptoms coming back. How to get support and help from others How to talk with friends and family members about your condition  Talking to friends and family members about your condition can provide you with one way to get support and guidance. Reach out to trusted friends or family members, explain your symptoms to them, and let them know that you are  working with a health care provider to treat your depression. Financial resources Not all insurance plans cover mental health care, so it is important to check with your insurance carrier. If paying for co-pays or counseling services is a problem, search for a local or county mental health care center. They may be able to offer public mental health care services at low or no cost when you are not able to see a private health care provider. If you are taking medicine for depression, you may be able to get the generic form, which may be less expensive. Some makers of prescription medicines also offer help to patients who cannot afford the medicines they need. Follow these instructions at home:   Get the right amount and quality of sleep.  Cut down on using caffeine, tobacco, alcohol, and other potentially harmful substances.  Try to exercise, such as walking or lifting small weights.  Take over-the-counter and prescription medicines only as told by your health care provider.  Eat a healthy diet that includes plenty of vegetables, fruits, whole grains, low-fat dairy products, and lean protein. Do not eat a lot of foods that are high in solid fats, added sugars, or salt.    Keep all follow-up visits as told by your health care provider. This is important. Contact a health care provider if:  You stop taking your antidepressant medicines, and you have any of these symptoms: ? Nausea. ? Headache. ? Feeling lightheaded. ? Chills and body aches. ? Not being able to sleep (insomnia).  You or your friends and family think your depression is getting worse. Get help right away if:  You have thoughts of hurting yourself or others. If you ever feel like you may hurt yourself or others, or have thoughts about taking your own life, get help right away. You can go to your nearest emergency department or call:  Your local emergency services (911 in the [place]  A suicide crisis helpline, such as the  National Suicide Prevention Lifeline at 1-800-273-8255. This is open 24-hours a day. Summary  If you are living with depression, there are ways to help you recover from it and also ways to prevent it from coming back.  Work with your health care team to create a management plan that includes counseling, stress management techniques, and healthy lifestyle habits. This information is not intended to replace advice given to you by your health care provider. Make sure you discuss any questions you have with your health care provider. Document Revised: 09/28/2018 Document Reviewed: 05/09/2016 [employer] Patient Education  2020 [employer]  

## 2019-09-10 ENCOUNTER — Other Ambulatory Visit: Payer: Self-pay

## 2019-09-10 ENCOUNTER — Encounter: Payer: Self-pay | Admitting: Cardiovascular Disease

## 2019-09-10 ENCOUNTER — Ambulatory Visit (INDEPENDENT_AMBULATORY_CARE_PROVIDER_SITE_OTHER): Payer: Medicare Other | Admitting: Cardiovascular Disease

## 2019-09-10 VITALS — BP 110/66 | HR 58 | Temp 95.2°F | Ht 68.0 in | Wt 140.0 lb

## 2019-09-10 DIAGNOSIS — I2510 Atherosclerotic heart disease of native coronary artery without angina pectoris: Secondary | ICD-10-CM | POA: Diagnosis not present

## 2019-09-10 DIAGNOSIS — F329 Major depressive disorder, single episode, unspecified: Secondary | ICD-10-CM

## 2019-09-10 DIAGNOSIS — K219 Gastro-esophageal reflux disease without esophagitis: Secondary | ICD-10-CM

## 2019-09-10 DIAGNOSIS — E785 Hyperlipidemia, unspecified: Secondary | ICD-10-CM

## 2019-09-10 DIAGNOSIS — R0789 Other chest pain: Secondary | ICD-10-CM

## 2019-09-10 DIAGNOSIS — F32A Depression, unspecified: Secondary | ICD-10-CM

## 2019-09-10 NOTE — Patient Instructions (Signed)

## 2019-09-10 NOTE — Progress Notes (Signed)
Patient ID: Jesus Jordan, male   DOB: Nov 24, 1945, 74 y.o.   MRN: [id number]    PCP: Dr. [name]   HPI: Jesus Jordan is a 74 y.o. male  who presents to the office today for a 7 month cardiology evaluation.  Jesus Jordan suffered a large inferolateral myocardial infarction on 09/12/2008 and presented to [facility] with recurrent episodes of ventricular fibrillation and required numerous defibrillator shocks along with CPR.  I was able to successfully perform intervention to a totally occluded proximal left circumflex coronary artery. This was stented and more distally he had diffuse disease with joint and underwent PTCA at multiple sites in the very distal aspect of this dominant circumflex system. His RCA  was diffusely diseased with stenoses of 90% and was nondominant. His LAD was normal. He had undergone an initial stress test in one year showed an area of scar and inferolateral wall at the mid to basal region. He subsequently was taken off his Effient and then switched to Plavix. A nuclear perfusion study in September 2012 was unchanged from previously and showed the small moderate region of the mid to basal inferior inferolateral scar without associated ischemia.  On 03/04/2014 a three-year followup nuclear perfusion study continued to be low risk and only showed a small fixed inferior defect without reversible ischemia.  He had normal wall motion.  Ejection fraction was 64%.  I saw him in March 2019 at which time he had developed a right inguinal hernia and was in need for surgical repair.  He had previously undergone successful repair of a left inguinal hernia by Dr. Lucia Gaskins.  When I saw him, he was stable without anginal symptomatology or palpitations but had noticed some mild episodes of dizziness.  I recommended he reduce lisinopril from 10 mg to 5 mg.  He underwent right inguinal hernia surgery successfully without cardiovascular compromise.  His dizziness has improved but at times he still  notes rare episodes.  Of note, since suffering his myocardial infarction in March 2010 he has lost 75 pounds.    I last saw him in November 2019 and at that time he was slightly bradycardic.  I suggested slight reduction of carvedilol to 12.5 mg in the morning and 6.2 5 at night.  Dr. [name] has been checking laboratory and he was on Zetia 10 mg and simvastatin 40 mg.  I suggested that if his LDL cholesterol was greater than 70 it would be advisable to switch to either atorvastatin or rosuvastatin rather than simvastatin.  He  had been doing fairly well but in June 2020 re-presented to the emergency room with complaints of chest pain.  Troponin was negative.  He subsequently was referred for an outpatient Myoview study on December 18, 2018 which showed an EF of 67% and small inferior wall infarct from apex to base with minimal peri-infarction ischemia but was felt to be a low risk study.  His echo Doppler study showed an EF of 55 to 60% with mild to moderate TR.  He was evaluated by Almyra Deforest, PAC on December 20, 2018 his blood pressure was low.  He has continued to play doubles tennis without exertional chest pain.  He was off isosorbide due to his low blood pressure and this was not restarted at his follow-up visit with Almyra Deforest, PA.    I last saw him in November 2020 at which time he felt improved.  His blood pressure remained low.  He denied recurrent chest pain symptomatology or shortness of  breath but if he stood up very quickly he did note some very transient lightheadedness.   Jesus Jordan was recently evaluated by Dr. Davina Poke on August 28, 2019 after he presented complaining of intermittent left-sided chest pain over the past 10 days.  His chest pain did not appear to be ischemic in etiology, but the patient was extremely concerned.  His ECG was normal.  He was very stressed and felt depressed.  Ultimately it was advised that he go to the emergency room for laboratory.  Troponins were negative.  It was felt to  be noncardiac etiology.  He presents to the office today for reevaluation.  Past Medical History:  Diagnosis Date  . CAD 01/29/2009   Qualifier: Diagnosis of  By: [name] MD, [name]    . COPD (chronic obstructive pulmonary disease) ([place]) 12/10/2018  . GOUT 01/29/2009   Qualifier: Diagnosis of  By: [name] MD, [name]    . Gout   . Heart disease    cardiologist Dr Claiborne Billings  . History of MI (myocardial infarction) 09/13/2006   large inferolateral MI secondary to total occlusion of circumflex  . History of nuclear stress test 02/23/2012   exercise myoview; area of scar in inferolateral wall at mid-basal region  . Hyperlipidemia   . INGUINAL HERNIA 05/25/2009   Qualifier: Diagnosis of  By: [name] MD, [name]      Past Surgical History:  Procedure Laterality Date  . CORONARY STENT PLACEMENT  09/12/2008   r/t MI; 3.5x61m Xience DES to circumflex & PTCA at multiple sites in distal region of vessel; alos diffusely diseaase, narrowed, nondominant RCA  . FOOT SURGERY  1960  . HERNIA REPAIR  2011   left inguinal hernia  . TRANSTHORACIC ECHOCARDIOGRAM  03/02/2009   EWG=95-62% LV systolic function borderline reduced with mild inferior wall hypokinesis; mild-mod MR; mild TR; AV mildly sclerotic; mild aortic root dilatation    Allergies  Allergen Reactions  . Imdur [Isosorbide Nitrate] Other (See Comments)    Caused vertigo  . Lisinopril Other (See Comments)    Possible vertigo??    Current Outpatient Medications  Medication Sig Dispense Refill  . allopurinol (ZYLOPRIM) 300 MG tablet Take 1 tablet by mouth once daily (Patient taking differently: Take 300 mg by mouth daily. ) 90 tablet 0  . aspirin 81 MG tablet Take 81 mg by mouth at bedtime.     . calcium carbonate (TUMS - DOSED IN MG ELEMENTAL CALCIUM) 500 MG chewable tablet Chew 1-2 tablets by mouth as needed for indigestion or heartburn.    . carvedilol (COREG) 12.5 MG tablet Take 12.5 mg by mouth in the morning and 12.5 mg in the evening 135  tablet 3  . clopidogrel (PLAVIX) 75 MG tablet Take 1 tablet by mouth once daily (Patient taking differently: Take 75 mg by mouth daily. ) 90 tablet 0  . desoximetasone (TOPICORT) 0.25 % cream APPLY TWICE A DAY AS DIRECTED (Patient taking differently: Apply 1 application topically See admin instructions. Apply to affected areas of face 2 times a day as directed) 15 g 0  . ezetimibe (ZETIA) 10 MG tablet Take 1 tablet by mouth once daily (Patient taking differently: Take 10 mg by mouth at bedtime. ) 90 tablet 3  . fluticasone (CUTIVATE) 0.05 % cream Apply 1 application topically daily as needed (to affected areas of face).     . mirtazapine (REMERON) 15 MG tablet Take 1 tablet (15 mg total) by mouth at bedtime. 30 tablet 5  . Multiple Vitamins-Minerals (ONE-A-DAY  MENS 50+ ADVANTAGE) TABS Take 1 tablet by mouth 3 (three) times a week.     . nitroGLYCERIN (NITROSTAT) 0.4 MG SL tablet DISSOLVE ONE TABLET UNDER THE TONGUE EVERY 5 MINUTES AS NEEDED FOR CHEST PAIN.  DO NOT EXCEED A TOTAL OF 3 DOSES IN 15 MINUTES...CALL 911 25 tablet 3  . pantoprazole (PROTONIX) 40 MG tablet Take 1 tablet (40 mg total) by mouth daily as needed. (Patient taking differently: Take 40 mg by mouth daily as needed (for GERD). ) 30 tablet 4  . simvastatin (ZOCOR) 40 MG tablet TAKE 1 TABLET BY MOUTH AT BEDTIME (Patient taking differently: Take 40 mg by mouth at bedtime. ) 90 tablet 0  . tetrahydrozoline-zinc (VISINE-AC) 0.05-0.25 % ophthalmic solution Place 2 drops into both eyes 3 (three) times daily as needed (for itching ot redness).     No current facility-administered medications for this visit.    Social History   Socioeconomic History  . Marital status: Single    Spouse name: Not on file  . Number of children: 3  . Years of education: Not on file  . Highest education level: Not on file  Occupational History  . Not on file  Tobacco Use  . Smoking status: Current Some Day Smoker    Years: 30.00    Types: Cigarettes    . Smokeless tobacco: Never Used  . Tobacco comment: skip days some; pack last 3 days   Substance and Sexual Activity  . Alcohol use: Yes    Comment: occasional   . Drug use: No  . Sexual activity: Not on file  Other Topics Concern  . Not on file  Social History Narrative  . Not on file   Social Determinants of Health   Financial Resource Strain:   . Difficulty of Paying Living Expenses:   Food Insecurity:   . Worried About Charity fundraiser in the Last Year:   . Arboriculturist in the Last Year:   Transportation Needs:   . Film/video editor (Medical):   Marland Kitchen Lack of Transportation (Non-Medical):   Physical Activity:   . Days of Exercise per Week:   . Minutes of Exercise per Session:   Stress:   . Feeling of Stress :   Social Connections:   . Frequency of Communication with Friends and Family:   . Frequency of Social Gatherings with Friends and Family:   . Attends Religious Services:   . Active Member of Clubs or Organizations:   . Attends Archivist Meetings:   Marland Kitchen Marital Status:   Intimate Partner Violence:   . Fear of Current or Ex-Partner:   . Emotionally Abused:   Marland Kitchen Physically Abused:   . Sexually Abused:    Social history is notable that he is divorced.  His daughter is now working in [employer].  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  There is no present tobacco use.  Family History  Problem Relation Age of Onset  . Colon cancer Father   . Hyperlipidemia Brother   . Heart disease Brother 36       massive MI  . Cancer Paternal Grandfather        colon   Socially he is divorced. Has 3 children and 2 grandchildren. No tobacco use. He does drink occasional alcohol. He does play tennis.  His daughter, [name] is working at [employer] in [employer] in [employer].  ROS General: Negative; No fevers, chills, or night sweats;  HEENT: Negative; No changes in vision or hearing, sinus congestion, difficulty  swallowing Pulmonary: Negative; No cough, wheezing, shortness of breath, hemoptysis Cardiovascular: Negative; No chest pain, presyncope, syncope, palpitations GI: Negative; No nausea, vomiting, diarrhea, or abdominal pain GU: Positive for  Bilateral inguinal hernia surgeries;  No dysuria, hematuria, or difficulty voiding Musculoskeletal: Negative; no myalgias, joint pain, or weakness He has a history of gout, but denies any recurrence. Hematologic/Oncology: Negative; no easy bruising, bleeding Endocrine: Negative; no heat/cold intolerance; no diabetes Neuro: Negative; no changes in balance, headaches Skin: Negative; No rashes or skin lesions Psychiatric: Negative; No behavioral problems, depression Sleep: Negative; No snoring, daytime sleepiness, hypersomnolence, bruxism, restless legs, hypnogognic hallucinations, no cataplexy Other comprehensive 14 point system review is negative.  PE BP 110/66 (BP Location: Left Arm, Patient Position: Sitting, Cuff Size: Normal)   Pulse (!) 58   Temp (!) 95.2 F (35.1 C)   Ht _0  (1.727 m)   Wt 140 lb (63.5 kg)   BMI 21.29 kg/m    Repeat blood pressure by me was 124/72.  Wt Readings from Last 3 Encounters:  09/10/19 140 lb (63.5 kg)  09/03/19 145 lb (65.8 kg)  08/28/19 148 lb (67.1 kg)   General: Alert, oriented, no distress.  Skin: normal turgor, no rashes, warm and dry HEENT: Normocephalic, atraumatic. Pupils equal round and reactive to light; sclera anicteric; extraocular muscles intact;  Nose without nasal septal hypertrophy Mouth/Parynx benign; Mallinpatti scale 2 Neck: No JVD, no carotid bruits; normal carotid upstroke Lungs: clear to ausculatation and percussion; no wheezing or rales Chest wall: without tenderness to palpitation Heart: PMI not displaced, RRR, s1 s2 normal, 1/6 systolic murmur, no diastolic murmur, no rubs, gallops, thrills, or heaves Abdomen: soft, nontender; no hepatosplenomehaly, BS+; abdominal aorta nontender  and not dilated by palpation. Back: no CVA tenderness Pulses 2+ Musculoskeletal: full range of motion, normal strength, no joint deformities Extremities: no clubbing cyanosis or edema, Homan's sign negative  Neurologic: grossly nonfocal; Cranial nerves grossly wnl Psychologic: Normal mood and affect   ECG (independently read by me): Sinus bradycardia at 58 bpm, first-degree AV block with a parable of 230 ms.  No ectopy.  August 2020 ECG (independently read by me): Normal sinus rhythm at 62 bpm; first-degree AV block with PR interval 222 ms.  No ectopy.  QTc interval 414 ms  November 2019 ECG (independently read by me): Sinus bradycardia 57 bpm.  Borderline first-degree AV block with appeared normal at 206 ms.  March 2019 ECG (independently read by me): sinus bradycardia 58 bpm.  Borderline first-degree AV blo  No ST segment abnormalities.  November 2017 ECG (independently read by me): Normal sinus rhythm at 61 bpm.  Borderline first-degree AV block with a PR interval of 210 ms.  QTc interval normal at 390 ms.  November 2016 ECG (independently read by me): Normal sinus rhythm at 61 bpm.  No ectopy.  Normal intervals.  No ECG evidence of prior myocardial infarction.  September 2015 ECG (independently read by me): Sinus bradycardia 57 beats per minute.  Borderline first-degree AV block with PR interval of 204 ms.  Prior ECG: Sinus rhythm at 63 beats per minute. Normal intervals.  LABS:  BMP Latest Ref Rng & Units 08/28/2019 12/09/2018 07/10/2017  Glucose 70 - 99 mg/dL 112(H) 155(H) 99  BUN 8 - 23 mg/dL _1 Creatinine 0.61 - 1.24 mg/dL 0.97 0.96 0.84  Sodium 135 - 145 mmol/L 137 134(L) 138  Potassium 3.5 - 5.1 mmol/L 4.0  3.9 4.5  Chloride 98 - 111 mmol/L 101 103 103  CO2 22 - 32 mmol/L _0 Calcium 8.9 - 10.3 mg/dL 9.4 9.2 9.8   Hepatic Function Latest Ref Rng & Units 12/09/2018 07/10/2017 03/21/2016  Total Protein 6.5 - 8.1 g/dL 6.8 6.9 7.3  Albumin 3.5 - 5.0 g/dL 4.0 4.4 4.1   AST 15 - 41 U/L _1 ALT 0 - 44 U/L _2 Alk Phosphatase 38 - 126 U/L 66 65 73  Total Bilirubin 0.3 - 1.2 mg/dL 1.1 0.8 0.8  Bilirubin, Direct 0.0 - 0.3 mg/dL - 0.1 0.2   CBC Latest Ref Rng & Units 08/28/2019 12/09/2018 03/21/2016  WBC 4.0 - 10.5 K/uL 6.2 6.3 5.7  Hemoglobin 13.0 - 17.0 g/dL 14.1 13.5 14.9  Hematocrit 39.0 - 52.0 % 42.5 39.8 43.5  Platelets 150 - 400 K/uL 143(L) 140(L) 157.0   Lab Results  Component Value Date   MCV 101.2 (H) 08/28/2019   MCV 100.5 (H) 12/09/2018   MCV 98.6 03/21/2016   Lab Results  Component Value Date   TSH 0.68 03/21/2016   Lipid Panel     Component Value Date/Time   CHOL 154 12/10/2018 1230   CHOL 142 03/06/2013 1039   TRIG 30 12/10/2018 1230   TRIG 74 03/06/2013 1039   HDL 87 12/10/2018 1230   HDL 73 03/06/2013 1039   CHOLHDL 1.8 12/10/2018 1230   VLDL 6 12/10/2018 1230   LDLCALC 61 12/10/2018 1230   LDLCALC 54 03/06/2013 1039   RADIOLOGY: No results found.   IMPRESSION: 1. Atypical chest pain   2. Coronary artery disease involving native coronary artery of native heart without angina pectoris   3. Hyperlipidemia LDL goal <70   4. Depression, unspecified depression type   5. Gastroesophageal reflux disease without esophagitis    ASSESSMENT AND PLAN: Jesus Jordan is a 74 year-old Caucasian male who suffered a VF cardiac arrest secondary to total proximal occlusion of his of a dominant left circumflex artery artery on 09/12/2008.  He has been demonstrated to have significant myocardial salvage in the lateral wall but does have mid to basal inferior to inferolateral scar. His  nuclear perfusion study in 2015  did not reveal any wall motion abnormality and his ejection fraction was 64% without evidence for ischemia.  He was evaluated in the emergency room in June 2020 pain was not felt to be ischemic and possibly anxiety mediated.    Troponins were negative.  Subsequent nuclear study was unchanged from previously and was low risk  with previous demonstration of inferior scar.  A subsequent echo Doppler study reveals an EF of 55 to 60% and he had normal diastolic parameters.  There was mild to moderate TR.  He has had issues with orthostatic hypotension resulting in reduction in his medical regimen.  When last seen by me in November 2020 his blood pressure was low and with his normal systolic and diastolic function and some orthostatic dizziness I recommended discontinuance of lisinopril.  He had recently developed a 10-day history of recurrent chest pain which again was nonischemic in etiology.  This prompted an ER evaluation, troponins were again negative.  His blood pressure today is stable without orthostatic change on his regimen now consisting of carvedilol 12.5 mg twice a day.  His resting pulse is sinus bradycardia at 58 bpm.  He continues to be on DAPT with aspirin/Plavix and is not having bleeding.  He is on Zetia 26m  and simvastatin 40 mg for hyperlipidemia with target LDL less than 70.  He continues to be on mirtazapine 15 mg for depression.  GERD is controlled with pantoprazole.  On laboratory in June 2020 LDL was 61.     Troy Sine, MD, [facility]  09/12/2019 5:37 PM

## 2019-09-12 ENCOUNTER — Encounter: Payer: Self-pay | Admitting: Cardiovascular Disease

## 2019-09-16 ENCOUNTER — Telehealth: Payer: Self-pay | Admitting: Family Medicine

## 2019-09-16 NOTE — Telephone Encounter (Signed)
Pt canceled 3 week follow up for 09/24/19 he advised he only took depression medication for 1 day and did not like the way it made him feel. Pt did schedule a 6 month follow up.

## 2019-09-16 NOTE — Telephone Encounter (Signed)
fyi

## 2019-09-24 ENCOUNTER — Ambulatory Visit: Payer: Medicare Other | Admitting: Family Medicine

## 2019-09-24 DIAGNOSIS — L718 Other rosacea: Secondary | ICD-10-CM | POA: Diagnosis not present

## 2019-09-24 DIAGNOSIS — D692 Other nonthrombocytopenic purpura: Secondary | ICD-10-CM | POA: Diagnosis not present

## 2019-09-24 DIAGNOSIS — L570 Actinic keratosis: Secondary | ICD-10-CM | POA: Diagnosis not present

## 2019-09-24 DIAGNOSIS — L821 Other seborrheic keratosis: Secondary | ICD-10-CM | POA: Diagnosis not present

## 2019-09-24 DIAGNOSIS — Z85828 Personal history of other malignant neoplasm of skin: Secondary | ICD-10-CM | POA: Diagnosis not present

## 2019-11-23 ENCOUNTER — Other Ambulatory Visit: Payer: Self-pay | Admitting: Cardiovascular Disease

## 2019-11-23 ENCOUNTER — Other Ambulatory Visit: Payer: Self-pay | Admitting: Family Medicine

## 2019-11-28 ENCOUNTER — Other Ambulatory Visit: Payer: Self-pay

## 2019-11-28 MED ORDER — CARVEDILOL 12.5 MG PO TABS: ORAL_TABLET | ORAL | 3 refills | Status: DC

## 2020-01-31 ENCOUNTER — Ambulatory Visit: Payer: Medicare Other | Admitting: Family Medicine

## 2020-02-01 ENCOUNTER — Other Ambulatory Visit: Payer: Self-pay | Admitting: Family Medicine

## 2020-02-05 ENCOUNTER — Telehealth: Payer: Self-pay | Admitting: Family Medicine

## 2020-02-05 NOTE — Telephone Encounter (Signed)
Appt moved up to 11 on 02/10/20

## 2020-02-05 NOTE — Telephone Encounter (Signed)
Pt daughter called concerned about him. Dementia runs in the family and she thinks he is showing signs.   She wants an appointment sooner than the 27th also a referral to a neurologist for pt.  Please call [name] at [phone]  Please advise

## 2020-02-10 ENCOUNTER — Other Ambulatory Visit: Payer: Self-pay

## 2020-02-10 ENCOUNTER — Ambulatory Visit (INDEPENDENT_AMBULATORY_CARE_PROVIDER_SITE_OTHER): Payer: Medicare Other | Admitting: Family Medicine

## 2020-02-10 ENCOUNTER — Encounter: Payer: Self-pay | Admitting: Family Medicine

## 2020-02-10 VITALS — BP 110/62 | HR 65 | Temp 97.9°F | Ht 68.0 in | Wt 136.0 lb

## 2020-02-10 DIAGNOSIS — I10 Essential (primary) hypertension: Secondary | ICD-10-CM | POA: Diagnosis not present

## 2020-02-10 DIAGNOSIS — R634 Abnormal weight loss: Secondary | ICD-10-CM

## 2020-02-10 DIAGNOSIS — I2510 Atherosclerotic heart disease of native coronary artery without angina pectoris: Secondary | ICD-10-CM

## 2020-02-10 DIAGNOSIS — R413 Other amnesia: Secondary | ICD-10-CM

## 2020-02-10 DIAGNOSIS — E785 Hyperlipidemia, unspecified: Secondary | ICD-10-CM | POA: Diagnosis not present

## 2020-02-10 NOTE — Patient Instructions (Signed)
Cognitive Tips  Keep a journal/notebook with sections for the following (or use sections separately as needed):  Calendar and appointment sheet, schedule for each day, lists of reminders (such as grocery lists or "to do" list), homework assignments for therapy, important information such as family and friends names / addresses / phone numbers, medications, medical history and doctors name / phone numbers.  Avoid / remove clutter and unnecessary items from areas such as countertops / cabinets in kitchen and bathroom, closets, etc.  Organize items by purpose.  Baskets and bins help with this.  Leave notes for reminders above task to be completed.  For example: Note to turn off stove over the the stove; note to lock door beside the door, not to brush teeth then wash face by sink, note to take medication on table etc.  To help recall names of people of people or things, mentally or verbally go through the alphabet to try to determine the 1st letter of the word as this may trigger the name or word you are looking for.  If this is too difficult and someone else knows the word, have them give you the first letter by asking, "does it start with an "A", "B", "C" etc. (or have them give you the first sound of the word or some other clue).  Review family events, occasions, names, etc.  Pictures are a good way to trigger memory.  Have others correct you if you answer something incorrectly.  Have them speak slowly with a few words to give you time to process and respond.  Don't let others automatically problem-solve. (For example: don't let them automatically lay out clothes in the correct position, but hand it to you folded so that you can figure it out for yourself.)  However, if you need help with tasks, they should give you as little as they can so that you can be successful.  If appropriate and safe, they may allow you to make mistakes so that you can figure out how to correct the error.  (For example, they  may allow you to put your shoes on the wrong foot to see if you notice that is wrong).  If you struggle, they should give you a cue.  (Example: "Do your shoes feel right?"  "Do they look right?") 

## 2020-02-10 NOTE — Progress Notes (Signed)
Established Patient Office Visit  Subjective:  Patient ID: Jesus Jordan, male    DOB: 1946/03/17  Age: 74 y.o. MRN: [id number]  CC:  Chief Complaint  Patient presents with  . Follow-up    6 months follow-up  . Memory Loss    notice more pass 2 months. Family hx of dementia    HPI Dewan Emond presents for family concerned about possible cognitive deficits.  His past medical history significant for CAD, hypertension, COPD, gout, hyperlipidemia.  His daughter who lives in [place] accompanies him today.  She has noted recently in speaking with him on the phone that he has been more forgetful and repetitious.  They are concerned because patient has an older sister that recently died of dementia complications and there concerned because of the family history.  He has also lost substantial weight from and is here back in the spring.  His weight was 145 pounds then 136 now.  He does not feel he has had any major change in appetite.  He thinks he is simply not taking time to prepare meals.  He is not always eating consistently 3 times daily.  He denies any headaches, abdominal pain, change in stool habits, dysuria, adenopathy, night sweats.  He will be due for repeat colonoscopy later this year.  Past Medical History:  Diagnosis Date  . CAD 01/29/2009   Qualifier: Diagnosis of  By: [name] MD, Lorece Keach    . COPD (chronic obstructive pulmonary disease) ([place]) 12/10/2018  . GOUT 01/29/2009   Qualifier: Diagnosis of  By: [name] MD, Foster Frericks    . Gout   . Heart disease    cardiologist Dr Claiborne Billings  . History of MI (myocardial infarction) 09/13/2006   large inferolateral MI secondary to total occlusion of circumflex  . History of nuclear stress test 02/23/2012   exercise myoview; area of scar in inferolateral wall at mid-basal region  . Hyperlipidemia   . INGUINAL HERNIA 05/25/2009   Qualifier: Diagnosis of  By: [name] MD, Kemari Mares      Past Surgical History:  Procedure Laterality Date  . CORONARY  STENT PLACEMENT  09/12/2008   r/t MI; 3.5x55mm Xience DES to circumflex & PTCA at multiple sites in distal region of vessel; alos diffusely diseaase, narrowed, nondominant RCA  . FOOT SURGERY  1960  . HERNIA REPAIR  2011   left inguinal hernia  . TRANSTHORACIC ECHOCARDIOGRAM  03/02/2009   OQ=94-76%; LV systolic function borderline reduced with mild inferior wall hypokinesis; mild-mod MR; mild TR; AV mildly sclerotic; mild aortic root dilatation    Family History  Problem Relation Age of Onset  . Colon cancer Father   . Hyperlipidemia Brother   . Heart disease Brother 12       massive MI  . Cancer Paternal Grandfather        colon    Social History   Socioeconomic History  . Marital status: Single    Spouse name: Not on file  . Number of children: 3  . Years of education: Not on file  . Highest education level: Not on file  Occupational History  . Not on file  Tobacco Use  . Smoking status: Current Some Day Smoker    Years: 30.00    Types: Cigarettes  . Smokeless tobacco: Never Used  . Tobacco comment: skip days some; pack last 3 days   Vaping Use  . Vaping Use: Never used  Substance and Sexual Activity  . Alcohol use: Yes    Comment: occasional   .  Drug use: No  . Sexual activity: Not on file  Other Topics Concern  . Not on file  Social History Narrative  . Not on file   Social Determinants of Health   Financial Resource Strain:   . Difficulty of Paying Living Expenses: Not on file  Food Insecurity:   . Worried About Charity fundraiser in the Last Year: Not on file  . Ran Out of Food in the Last Year: Not on file  Transportation Needs:   . Lack of Transportation (Medical): Not on file  . Lack of Transportation (Non-Medical): Not on file  Physical Activity:   . Days of Exercise per Week: Not on file  . Minutes of Exercise per Session: Not on file  Stress:   . Feeling of Stress : Not on file  Social Connections:   . Frequency of Communication with Friends  and Family: Not on file  . Frequency of Social Gatherings with Friends and Family: Not on file  . Attends Religious Services: Not on file  . Active Member of Clubs or Organizations: Not on file  . Attends Archivist Meetings: Not on file  . Marital Status: Not on file  Intimate Partner Violence:   . Fear of Current or Ex-Partner: Not on file  . Emotionally Abused: Not on file  . Physically Abused: Not on file  . Sexually Abused: Not on file    Outpatient Medications Prior to Visit  Medication Sig Dispense Refill  . allopurinol (ZYLOPRIM) 300 MG tablet Take 1 tablet by mouth once daily 90 tablet 0  . aspirin 81 MG tablet Take 81 mg by mouth at bedtime.     . calcium carbonate (TUMS - DOSED IN MG ELEMENTAL CALCIUM) 500 MG chewable tablet Chew 1-2 tablets by mouth as needed for indigestion or heartburn.    . carvedilol (COREG) 12.5 MG tablet Take 12.5 mg by mouth in the morning and 12.5 mg in the evening 135 tablet 3  . clopidogrel (PLAVIX) 75 MG tablet Take 1 tablet by mouth once daily 90 tablet 3  . desoximetasone (TOPICORT) 0.25 % cream APPLY TWICE A DAY AS DIRECTED (Patient taking differently: Apply 1 application topically See admin instructions. Apply to affected areas of face 2 times a day as directed) 15 g 0  . ezetimibe (ZETIA) 10 MG tablet Take 1 tablet by mouth once daily (Patient taking differently: Take 10 mg by mouth at bedtime. ) 90 tablet 3  . fluticasone (CUTIVATE) 0.05 % cream Apply 1 application topically daily as needed (to affected areas of face).     . mirtazapine (REMERON) 15 MG tablet Take 1 tablet (15 mg total) by mouth at bedtime. 30 tablet 5  . Multiple Vitamins-Minerals (ONE-A-DAY MENS 50+ ADVANTAGE) TABS Take 1 tablet by mouth 3 (three) times a week.     . nitroGLYCERIN (NITROSTAT) 0.4 MG SL tablet DISSOLVE ONE TABLET UNDER THE TONGUE EVERY 5 MINUTES AS NEEDED FOR CHEST PAIN.  DO NOT EXCEED A TOTAL OF 3 DOSES IN 15 MINUTES...CALL 911 25 tablet 3  .  pantoprazole (PROTONIX) 40 MG tablet Take 1 tablet (40 mg total) by mouth daily as needed. (Patient taking differently: Take 40 mg by mouth daily as needed (for GERD). ) 30 tablet 4  . simvastatin (ZOCOR) 40 MG tablet TAKE 1 TABLET BY MOUTH AT BEDTIME 90 tablet 3  . tetrahydrozoline-zinc (VISINE-AC) 0.05-0.25 % ophthalmic solution Place 2 drops into both eyes 3 (three) times daily as needed (for itching  ot redness).     No facility-administered medications prior to visit.    Allergies  Allergen Reactions  . Imdur [Isosorbide Nitrate] Other (See Comments)    Caused vertigo  . Lisinopril Other (See Comments)    Possible vertigo??    ROS Review of Systems  Constitutional: Positive for unexpected weight change. Negative for appetite change, chills and fever.  Respiratory: Negative for cough, chest tightness and shortness of breath.   Cardiovascular: Negative for chest pain.  Gastrointestinal: Negative for abdominal pain, blood in stool, diarrhea, nausea and vomiting.  Genitourinary: Negative for dysuria and hematuria.  Neurological: Negative for headaches.  Hematological: Negative for adenopathy.  Psychiatric/Behavioral: Negative for agitation and dysphoric mood. The patient is not nervous/anxious.       Objective:    Physical Exam Vitals reviewed.  Cardiovascular:     Rate and Rhythm: Normal rate and regular rhythm.  Pulmonary:     Effort: Pulmonary effort is normal.     Breath sounds: Normal breath sounds.  Abdominal:     Palpations: Abdomen is soft. There is no mass.     Tenderness: There is no abdominal tenderness. There is no guarding or rebound.  Musculoskeletal:     Cervical back: Neck supple.     Right lower leg: No edema.     Left lower leg: No edema.  Lymphadenopathy:     Cervical: No cervical adenopathy.  Skin:    Findings: No rash.  Neurological:     General: No focal deficit present.     Mental Status: He is alert.     Cranial Nerves: No cranial nerve  deficit.  Psychiatric:     Comments: MMSE 24/30     BP 110/62   Pulse 65   Temp 97.9 F (36.6 C) (Oral)   Ht 5\' 8"  (1.727 m)   Wt 136 lb (61.7 kg)   SpO2 98%   BMI 20.68 kg/m  Wt Readings from Last 3 Encounters:  02/10/20 136 lb (61.7 kg)  09/10/19 140 lb (63.5 kg)  09/03/19 145 lb (65.8 kg)     Health Maintenance Due  Topic Date Due  . COVID-19 Vaccine (1) Never done  . TETANUS/TDAP  01/08/2020  . INFLUENZA VACCINE  01/19/2020    There are no preventive care reminders to display for this patient.  Lab Results  Component Value Date   TSH 0.68 03/21/2016   Lab Results  Component Value Date   WBC 6.2 08/28/2019   HGB 14.1 08/28/2019   HCT 42.5 08/28/2019   MCV 101.2 (H) 08/28/2019   PLT 143 (L) 08/28/2019   Lab Results  Component Value Date   NA 137 08/28/2019   K 4.0 08/28/2019   CO2 26 08/28/2019   GLUCOSE 112 (H) 08/28/2019   BUN 15 08/28/2019   CREATININE 0.97 08/28/2019   BILITOT 1.1 12/09/2018   ALKPHOS 66 12/09/2018   AST 30 12/09/2018   ALT 19 12/09/2018   PROT 6.8 12/09/2018   ALBUMIN 4.0 12/09/2018   CALCIUM 9.4 08/28/2019   ANIONGAP 10 08/28/2019   GFR 95.90 07/10/2017   Lab Results  Component Value Date   CHOL 154 12/10/2018   Lab Results  Component Value Date   HDL 87 12/10/2018   Lab Results  Component Value Date   LDLCALC 61 12/10/2018   Lab Results  Component Value Date   TRIG 30 12/10/2018   Lab Results  Component Value Date   CHOLHDL 1.8 12/10/2018   Lab Results  Component  Value Date   HGBA1C  09/13/2008    5.8 (NOTE)   The ADA recommends the following therapeutic goal for glycemic   control related to Hgb A1C measurement:   Goal of Therapy:   < 7.0% Hgb A1C   Reference: American Diabetes Association: Clinical Practice   Recommendations 2008, Diabetes Care,  2008, 31:(Suppl 1).      Assessment & Plan:   Problem List Items Addressed This Visit      Unprioritized   Essential hypertension   Hyperlipidemia  with target LDL less than 70   Relevant Orders   Lipid panel   Hepatic function panel    Other Visit Diagnoses    Weight loss    -  Primary   Relevant Orders   Basic metabolic panel   Memory loss       Relevant Orders   CBC with Differential/Platelet   TSH   Vitamin B12    Patient has positive family history of Alzheimer's dementia as above.  He presents with family concerns for cognitive impairment and scores 24/30 on MMSE.  This is very concerning in view of the family history as above.  Family thinks he is actually had some changes probably for well over a year but especially notable past couple of months  -Check lab work including TSH and B12 -If lab work unrevealing, consider trial of Aricept 5 mg daily with reassessment in 1 month and plans to titrate up to 10 mg at that point if tolerating well  He is also had some weight loss which may be tied to cognitive issues above.  We have suggested some simple things to boost calories such as Ensure or boost along with some simple to prepare healthy food options  No orders of the defined types were placed in this encounter.   Follow-up: Return in about 1 month (around 03/12/2020).    [name], MD

## 2020-02-11 ENCOUNTER — Other Ambulatory Visit: Payer: Self-pay

## 2020-02-11 LAB — CBC WITH DIFFERENTIAL/PLATELET
Absolute Monocytes: 915 cells/uL (ref 200–950)
Basophils Absolute: 59 cells/uL (ref 0–200)
Basophils Relative: 1 %
Eosinophils Absolute: 159 cells/uL (ref 15–500)
Eosinophils Relative: 2.7 %
HCT: 44.1 % (ref 38.5–50.0)
Hemoglobin: 14.8 g/dL (ref 13.2–17.1)
Lymphs Abs: 1487 cells/uL (ref 850–3900)
MCH: 33.6 pg — ABNORMAL HIGH (ref 27.0–33.0)
MCHC: 33.6 g/dL (ref 32.0–36.0)
MCV: 100 fL (ref 80.0–100.0)
MPV: 10.1 fL (ref 7.5–12.5)
Monocytes Relative: 15.5 %
Neutro Abs: 3280 cells/uL (ref 1500–7800)
Neutrophils Relative %: 55.6 %
Platelets: 145 10*3/uL (ref 140–400)
RBC: 4.41 10*6/uL (ref 4.20–5.80)
RDW: 13.1 % (ref 11.0–15.0)
Total Lymphocyte: 25.2 %
WBC: 5.9 10*3/uL (ref 3.8–10.8)

## 2020-02-11 LAB — BASIC METABOLIC PANEL
BUN: 20 mg/dL (ref 7–25)
CO2: 27 mmol/L (ref 20–32)
Calcium: 9.3 mg/dL (ref 8.6–10.3)
Chloride: 104 mmol/L (ref 98–110)
Creat: 0.9 mg/dL (ref 0.70–1.18)
Glucose, Bld: 74 mg/dL (ref 65–99)
Potassium: 4.6 mmol/L (ref 3.5–5.3)
Sodium: 139 mmol/L (ref 135–146)

## 2020-02-11 LAB — HEPATIC FUNCTION PANEL
AG Ratio: 1.7 (calc) (ref 1.0–2.5)
ALT: 21 U/L (ref 9–46)
AST: 28 U/L (ref 10–35)
Albumin: 4.3 g/dL (ref 3.6–5.1)
Alkaline phosphatase (APISO): 68 U/L (ref 35–144)
Bilirubin, Direct: 0.2 mg/dL (ref 0.0–0.2)
Globulin: 2.6 g/dL (calc) (ref 1.9–3.7)
Indirect Bilirubin: 0.6 mg/dL (calc) (ref 0.2–1.2)
Total Bilirubin: 0.8 mg/dL (ref 0.2–1.2)
Total Protein: 6.9 g/dL (ref 6.1–8.1)

## 2020-02-11 LAB — LIPID PANEL
Cholesterol: 140 mg/dL (ref <200)
HDL: 76 mg/dL (ref >=40)
LDL Cholesterol (Calc): 49 mg/dL (calc)
Non-HDL Cholesterol (Calc): 64 mg/dL (calc) (ref <130)
Total CHOL/HDL Ratio: 1.8 (calc) (ref <5.0)
Triglycerides: 66 mg/dL (ref <150)

## 2020-02-11 LAB — TSH: TSH: 1.4 mIU/L (ref 0.40–4.50)

## 2020-02-11 LAB — VITAMIN B12: Vitamin B-12: 469 pg/mL (ref 200–1100)

## 2020-02-11 MED ORDER — DONEPEZIL HCL 5 MG PO TABS: 5.0000 mg | ORAL_TABLET | Freq: Every day | ORAL | 1 refills | Status: DC

## 2020-02-14 ENCOUNTER — Ambulatory Visit: Payer: Medicare Other | Admitting: Family Medicine

## 2020-02-17 ENCOUNTER — Telehealth: Payer: Self-pay | Admitting: Family Medicine

## 2020-02-17 NOTE — Progress Notes (Signed)
°  Chronic Care Management   Outreach Note  02/17/2020 Name: Jesus Jordan MRN: [id number] DOB: 06/16/1946  Referred by: Eulas Post, MD Reason for referral : No chief complaint on file.   An unsuccessful telephone outreach was attempted today. The patient was referred to the pharmacist for assistance with care management and care coordination.   Follow Up Plan:   Carley Perdue [employer] Scheduler

## 2020-03-04 ENCOUNTER — Telehealth: Payer: Self-pay | Admitting: Family Medicine

## 2020-03-04 NOTE — Progress Notes (Signed)
  Chronic Care Management   Note  03/04/2020 Name: Hendry Speas MRN: [id number] DOB: [birth year]  Gabe Glace is a 74 y.o. year old male who is a primary care patient of Burchette, Alinda Sierras, MD. I reached out to [name] by phone today in response to a referral sent by Mr. Stirling Morandi's PCP, Eulas Post, MD.   Mr. Ding was given information about Chronic Care Management services today including:  1. CCM service includes personalized support from designated clinical staff supervised by his physician, including individualized plan of care and coordination with other care providers 2. 24/7 contact phone numbers for assistance for urgent and routine care needs. 3. Service will only be billed when office clinical staff spend 20 minutes or more in a month to coordinate care. 4. Only one practitioner may furnish and bill the service in a calendar month. 5. The patient may stop CCM services at any time (effective at the end of the month) by phone call to the office staff.   Patient agreed to services and verbal consent obtained.   Follow up plan:   Carley Perdue [employer] Scheduler

## 2020-03-05 ENCOUNTER — Other Ambulatory Visit: Payer: Self-pay

## 2020-03-06 ENCOUNTER — Ambulatory Visit (INDEPENDENT_AMBULATORY_CARE_PROVIDER_SITE_OTHER): Payer: Medicare Other | Admitting: Family Medicine

## 2020-03-06 ENCOUNTER — Encounter: Payer: Self-pay | Admitting: Family Medicine

## 2020-03-06 VITALS — BP 110/60 | HR 67 | Temp 98.2°F | Wt 135.2 lb

## 2020-03-06 DIAGNOSIS — R634 Abnormal weight loss: Secondary | ICD-10-CM

## 2020-03-06 DIAGNOSIS — Z23 Encounter for immunization: Secondary | ICD-10-CM

## 2020-03-06 DIAGNOSIS — I2510 Atherosclerotic heart disease of native coronary artery without angina pectoris: Secondary | ICD-10-CM

## 2020-03-06 DIAGNOSIS — R4189 Other symptoms and signs involving cognitive functions and awareness: Secondary | ICD-10-CM | POA: Diagnosis not present

## 2020-03-06 MED ORDER — DONEPEZIL HCL 10 MG PO TABS: 10.0000 mg | ORAL_TABLET | Freq: Every day | ORAL | 3 refills | Status: DC

## 2020-03-06 NOTE — Progress Notes (Signed)
Established Patient Office Visit  Subjective:  Patient ID: Jesus Jordan, male    DOB: [birth year]  Age: 74 y.o. MRN: [id number]  CC:  Chief Complaint  Patient presents with  . Follow-up    cognitive issues     HPI Jesus Jordan presents for follow-up regarding recent weight loss and cognitive impairment.  Refer to prior note for details.  He scored 24/30 on MMSE.  He had lab work including thyroid function and B12 which were normal.  Start Aricept 5 mg daily which she is tolerating well.  Our plan was to titrate up at this point.  Daughter states she has not seen much change yet.  They have taken good measures to try to ensure compliance with medications.  He has had some recent weight loss and weight is down about one more pound today compared to last visit.  He is doing some supplements with Ensure.  He states he is eating fairly regularly.  Denies any headaches, night sweats, hematuria, diarrhea, abdominal pain, dysphagia.  No recent falls.  Denies any major depression issues currently.  Recent labs reviewed with patient.  Normal thyroid function.  Chemistries and CBC were normal.  He has not had flu vaccine and is okay with getting that today.  Past Medical History:  Diagnosis Date  . CAD 01/29/2009   Qualifier: Diagnosis of  By: [name] MD, Darean Rote    . COPD (chronic obstructive pulmonary disease) ([place]) 12/10/2018  . GOUT 01/29/2009   Qualifier: Diagnosis of  By: [name] MD, Mavis Gravelle    . Gout   . Heart disease    cardiologist Dr Claiborne Billings  . History of MI (myocardial infarction) 09/13/2006   large inferolateral MI secondary to total occlusion of circumflex  . History of nuclear stress test 02/23/2012   exercise myoview; area of scar in inferolateral wall at mid-basal region  . Hyperlipidemia   . INGUINAL HERNIA 05/25/2009   Qualifier: Diagnosis of  By: [name] MD, Kirat Mezquita      Past Surgical History:  Procedure Laterality Date  . CORONARY STENT PLACEMENT  09/12/2008   r/t MI; 3.5x66mm  Xience DES to circumflex & PTCA at multiple sites in distal region of vessel; alos diffusely diseaase, narrowed, nondominant RCA  . FOOT SURGERY  1960  . HERNIA REPAIR  2011   left inguinal hernia  . TRANSTHORACIC ECHOCARDIOGRAM  03/02/2009   CB=76-28%; LV systolic function borderline reduced with mild inferior wall hypokinesis; mild-mod MR; mild TR; AV mildly sclerotic; mild aortic root dilatation    Family History  Problem Relation Age of Onset  . Colon cancer Father   . Hyperlipidemia Brother   . Heart disease Brother 40       massive MI  . Cancer Paternal Grandfather        colon    Social History   Socioeconomic History  . Marital status: Single    Spouse name: Not on file  . Number of children: 3  . Years of education: Not on file  . Highest education level: Not on file  Occupational History  . Not on file  Tobacco Use  . Smoking status: Current Some Day Smoker    Years: 30.00    Types: Cigarettes  . Smokeless tobacco: Never Used  . Tobacco comment: skip days some; pack last 3 days   Vaping Use  . Vaping Use: Never used  Substance and Sexual Activity  . Alcohol use: Yes    Comment: occasional   . Drug use: No  .  Sexual activity: Not on file  Other Topics Concern  . Not on file  Social History Narrative  . Not on file   Social Determinants of Health   Financial Resource Strain:   . Difficulty of Paying Living Expenses: Not on file  Food Insecurity:   . Worried About Charity fundraiser in the Last Year: Not on file  . Ran Out of Food in the Last Year: Not on file  Transportation Needs:   . Lack of Transportation (Medical): Not on file  . Lack of Transportation (Non-Medical): Not on file  Physical Activity:   . Days of Exercise per Week: Not on file  . Minutes of Exercise per Session: Not on file  Stress:   . Feeling of Stress : Not on file  Social Connections:   . Frequency of Communication with Friends and Family: Not on file  . Frequency of Social  Gatherings with Friends and Family: Not on file  . Attends Religious Services: Not on file  . Active Member of Clubs or Organizations: Not on file  . Attends Archivist Meetings: Not on file  . Marital Status: Not on file  Intimate Partner Violence:   . Fear of Current or Ex-Partner: Not on file  . Emotionally Abused: Not on file  . Physically Abused: Not on file  . Sexually Abused: Not on file    Outpatient Medications Prior to Visit  Medication Sig Dispense Refill  . allopurinol (ZYLOPRIM) 300 MG tablet Take 1 tablet by mouth once daily 90 tablet 0  . aspirin 81 MG tablet Take 81 mg by mouth at bedtime.     . calcium carbonate (TUMS - DOSED IN MG ELEMENTAL CALCIUM) 500 MG chewable tablet Chew 1-2 tablets by mouth as needed for indigestion or heartburn.    . carvedilol (COREG) 12.5 MG tablet Take 12.5 mg by mouth in the morning and 12.5 mg in the evening 135 tablet 3  . clopidogrel (PLAVIX) 75 MG tablet Take 1 tablet by mouth once daily 90 tablet 3  . desoximetasone (TOPICORT) 0.25 % cream APPLY TWICE A DAY AS DIRECTED (Patient taking differently: Apply 1 application topically See admin instructions. Apply to affected areas of face 2 times a day as directed) 15 g 0  . ezetimibe (ZETIA) 10 MG tablet Take 1 tablet by mouth once daily (Patient taking differently: Take 10 mg by mouth at bedtime. ) 90 tablet 3  . simvastatin (ZOCOR) 40 MG tablet TAKE 1 TABLET BY MOUTH AT BEDTIME 90 tablet 3  . donepezil (ARICEPT) 5 MG tablet Take 1 tablet (5 mg total) by mouth at bedtime. 30 tablet 1  . fluticasone (CUTIVATE) 0.05 % cream Apply 1 application topically daily as needed (to affected areas of face).  (Patient not taking: Reported on 03/06/2020)    . mirtazapine (REMERON) 15 MG tablet Take 1 tablet (15 mg total) by mouth at bedtime. (Patient not taking: Reported on 03/06/2020) 30 tablet 5  . Multiple Vitamins-Minerals (ONE-A-DAY MENS 50+ ADVANTAGE) TABS Take 1 tablet by mouth 3 (three) times  a week.  (Patient not taking: Reported on 03/06/2020)    . nitroGLYCERIN (NITROSTAT) 0.4 MG SL tablet DISSOLVE ONE TABLET UNDER THE TONGUE EVERY 5 MINUTES AS NEEDED FOR CHEST PAIN.  DO NOT EXCEED A TOTAL OF 3 DOSES IN 15 MINUTES...CALL 911 25 tablet 3  . pantoprazole (PROTONIX) 40 MG tablet Take 1 tablet (40 mg total) by mouth daily as needed. (Patient not taking: Reported on 03/06/2020)  30 tablet 4  . tetrahydrozoline-zinc (VISINE-AC) 0.05-0.25 % ophthalmic solution Place 2 drops into both eyes 3 (three) times daily as needed (for itching ot redness). (Patient not taking: Reported on 03/06/2020)     No facility-administered medications prior to visit.    Allergies  Allergen Reactions  . Imdur [Isosorbide Nitrate] Other (See Comments)    Caused vertigo  . Lisinopril Other (See Comments)    Possible vertigo??    ROS Review of Systems  Constitutional: Positive for unexpected weight change. Negative for appetite change, chills and fever.  Respiratory: Negative for cough and shortness of breath.   Cardiovascular: Negative for chest pain.  Gastrointestinal: Negative for abdominal pain and blood in stool.  Genitourinary: Negative for hematuria.  Hematological: Negative for adenopathy.      Objective:    Physical Exam  BP 110/60 (BP Location: Left Arm, Patient Position: Sitting, Cuff Size: Normal)   Pulse 67   Temp 98.2 F (36.8 C) (Oral)   Wt 135 lb 3.2 oz (61.3 kg)   SpO2 97%   BMI 20.56 kg/m  Wt Readings from Last 3 Encounters:  03/06/20 135 lb 3.2 oz (61.3 kg)  02/10/20 136 lb (61.7 kg)  09/10/19 140 lb (63.5 kg)     Health Maintenance Due  Topic Date Due  . COVID-19 Vaccine (1) Never done  . TETANUS/TDAP  01/08/2020  . INFLUENZA VACCINE  01/19/2020    There are no preventive care reminders to display for this patient.  Lab Results  Component Value Date   TSH 1.40 02/10/2020   Lab Results  Component Value Date   WBC 5.9 02/10/2020   HGB 14.8 02/10/2020   HCT  44.1 02/10/2020   MCV 100.0 02/10/2020   PLT 145 02/10/2020   Lab Results  Component Value Date   NA 139 02/10/2020   K 4.6 02/10/2020   CO2 27 02/10/2020   GLUCOSE 74 02/10/2020   BUN 20 02/10/2020   CREATININE 0.90 02/10/2020   BILITOT 0.8 02/10/2020   ALKPHOS 66 12/09/2018   AST 28 02/10/2020   ALT 21 02/10/2020   PROT 6.9 02/10/2020   ALBUMIN 4.0 12/09/2018   CALCIUM 9.3 02/10/2020   ANIONGAP 10 08/28/2019   GFR 95.90 07/10/2017   Lab Results  Component Value Date   CHOL 140 02/10/2020   Lab Results  Component Value Date   HDL 76 02/10/2020   Lab Results  Component Value Date   LDLCALC 49 02/10/2020   Lab Results  Component Value Date   TRIG 66 02/10/2020   Lab Results  Component Value Date   CHOLHDL 1.8 02/10/2020   Lab Results  Component Value Date   HGBA1C  09/13/2008    5.8 (NOTE)   The ADA recommends the following therapeutic goal for glycemic   control related to Hgb A1C measurement:   Goal of Therapy:   < 7.0% Hgb A1C   Reference: American Diabetes Association: Clinical Practice   Recommendations 2008, Diabetes Care,  2008, 31:(Suppl 1).      Assessment & Plan:   #1 cognitive impairment with likely dementia.  Strong family history of dementia as previously noted.  Patient currently on low-dose Aricept  -Increase Aricept to 10 mg daily -Set up evaluation with neuropsychologist to confirm diagnosis -57-month follow-up  #2 weight loss.  This is relatively stable compared to last visit.  We are hoping for some weight gain.  He is tried Remeron in the past without much benefit. -Recent lab work unremarkable -Continue  regular meals and healthy supplements and snacking -Reassess in 3 months -We discussed possible imaging such as CT abdomen pelvis but currently asymptomatic otherwise and weight relatively stable compared with last visit  Flu vaccine given  Meds ordered this encounter  Medications  . donepezil (ARICEPT) 10 MG tablet    Sig: Take  1 tablet (10 mg total) by mouth at bedtime.    Dispense:  90 tablet    Refill:  3    Follow-up: Return in about 3 months (around 06/05/2020).    [name], MD

## 2020-03-06 NOTE — Patient Instructions (Signed)
Will set up referral to neuropsychologist  Go ahead and increase the Aricept to 10 mg daily.  Let's plan on 3 month follow up.

## 2020-03-20 ENCOUNTER — Encounter: Payer: Medicare Other | Admitting: Psychology

## 2020-03-25 DIAGNOSIS — D485 Neoplasm of uncertain behavior of skin: Secondary | ICD-10-CM | POA: Diagnosis not present

## 2020-03-25 DIAGNOSIS — L718 Other rosacea: Secondary | ICD-10-CM | POA: Diagnosis not present

## 2020-03-25 DIAGNOSIS — D045 Carcinoma in situ of skin of trunk: Secondary | ICD-10-CM | POA: Diagnosis not present

## 2020-03-25 DIAGNOSIS — L570 Actinic keratosis: Secondary | ICD-10-CM | POA: Diagnosis not present

## 2020-03-25 DIAGNOSIS — Z85828 Personal history of other malignant neoplasm of skin: Secondary | ICD-10-CM | POA: Diagnosis not present

## 2020-03-25 DIAGNOSIS — D692 Other nonthrombocytopenic purpura: Secondary | ICD-10-CM | POA: Diagnosis not present

## 2020-03-25 DIAGNOSIS — L821 Other seborrheic keratosis: Secondary | ICD-10-CM | POA: Diagnosis not present

## 2020-03-25 DIAGNOSIS — D0439 Carcinoma in situ of skin of other parts of face: Secondary | ICD-10-CM | POA: Diagnosis not present

## 2020-03-27 ENCOUNTER — Encounter: Payer: Medicare Other | Admitting: Psychology

## 2020-04-20 ENCOUNTER — Telehealth: Payer: Self-pay

## 2020-04-20 ENCOUNTER — Telehealth: Payer: Self-pay | Admitting: Pharmacist

## 2020-04-20 DIAGNOSIS — E785 Hyperlipidemia, unspecified: Secondary | ICD-10-CM

## 2020-04-20 DIAGNOSIS — I10 Essential (primary) hypertension: Secondary | ICD-10-CM

## 2020-04-20 NOTE — Chronic Care Management (AMB) (Signed)
Chronic Care Management Pharmacy Assistant   Name: Jesus Jordan  MRN: [id number] DOB: December 14, 1945  Reason for Encounter:  Medication Review/Initial Questions for Pharmacist visit on 04/21/2020 Patient Questions: 1. Have you seen any other providers since your last visit? No 2. Any changes in your medications or health? Yes, Donepezil increased to 10 mg at bedtime. 3. Any side effects from any medications? No 4. Do you have any symptoms or problems not managed by your medications? No 5. Any concerns about your health right now? His daughter is concerned about the amount of medication he takes. 6. Has your provider asked that you check blood pressure, blood sugar, or follow a special diet at home? no 7. Do you get any type of exercise regularly? She states he plays tennis about two times a week. 8. Can you think of a goal you would like to reach for your health? Yes, she would like for him to gain some weight. 9. Do you have any problems getting your medications? No 10. Is there anything that you would like to discuss during the appointment? She would like to see if there is a better way for him to take his medication.  PCP : Eulas Post, MD  Allergies:   Allergies  Allergen Reactions  . Imdur [Isosorbide Nitrate] Other (See Comments)    Caused vertigo  . Lisinopril Other (See Comments)    Possible vertigo??    Medications: Outpatient Encounter Medications as of 04/20/2020  Medication Sig  . allopurinol (ZYLOPRIM) 300 MG tablet Take 1 tablet by mouth once daily  . aspirin 81 MG tablet Take 81 mg by mouth at bedtime.   . calcium carbonate (TUMS - DOSED IN MG ELEMENTAL CALCIUM) 500 MG chewable tablet Chew 1-2 tablets by mouth as needed for indigestion or heartburn.  . carvedilol (COREG) 12.5 MG tablet Take 12.5 mg by mouth in the morning and 12.5 mg in the evening  . clopidogrel (PLAVIX) 75 MG tablet Take 1 tablet by mouth once daily  . desoximetasone (TOPICORT) 0.25 % cream  APPLY TWICE A DAY AS DIRECTED (Patient taking differently: Apply 1 application topically See admin instructions. Apply to affected areas of face 2 times a day as directed)  . donepezil (ARICEPT) 10 MG tablet Take 1 tablet (10 mg total) by mouth at bedtime.  Marland Kitchen ezetimibe (ZETIA) 10 MG tablet Take 1 tablet by mouth once daily (Patient taking differently: Take 10 mg by mouth at bedtime. )  . fluticasone (CUTIVATE) 0.05 % cream Apply 1 application topically daily as needed (to affected areas of face).  (Patient not taking: Reported on 03/06/2020)  . mirtazapine (REMERON) 15 MG tablet Take 1 tablet (15 mg total) by mouth at bedtime. (Patient not taking: Reported on 03/06/2020)  . Multiple Vitamins-Minerals (ONE-A-DAY MENS 50+ ADVANTAGE) TABS Take 1 tablet by mouth 3 (three) times a week.  (Patient not taking: Reported on 03/06/2020)  . nitroGLYCERIN (NITROSTAT) 0.4 MG SL tablet DISSOLVE ONE TABLET UNDER THE TONGUE EVERY 5 MINUTES AS NEEDED FOR CHEST PAIN.  DO NOT EXCEED A TOTAL OF 3 DOSES IN 15 MINUTES...CALL 911  . pantoprazole (PROTONIX) 40 MG tablet Take 1 tablet (40 mg total) by mouth daily as needed. (Patient not taking: Reported on 03/06/2020)  . simvastatin (ZOCOR) 40 MG tablet TAKE 1 TABLET BY MOUTH AT BEDTIME  . tetrahydrozoline-zinc (VISINE-AC) 0.05-0.25 % ophthalmic solution Place 2 drops into both eyes 3 (three) times daily as needed (for itching ot redness). (Patient not taking: Reported  on 03/06/2020)   No facility-administered encounter medications on file as of 04/20/2020.    Current Diagnosis: Patient Active Problem List   Diagnosis Date Noted  . Cognitive impairment 03/06/2020  . COPD (chronic obstructive pulmonary disease) ([place]) 12/10/2018  . Essential hypertension 12/10/2018  . Chest pain 12/09/2018  . GERD (gastroesophageal reflux disease) 04/18/2018  . CAD in native artery 05/03/2016  . Old MI (myocardial infarction) 03/06/2013  . Hyperlipidemia with target LDL less than 70  03/06/2013  . Inguinal hernia 05/25/2009  . Gout 01/29/2009  . Coronary atherosclerosis 01/29/2009    Goals Addressed   None    Follow-Up:  Pharmacist Review   Maia Breslow, [place] Assistant [phone]

## 2020-04-20 NOTE — Telephone Encounter (Signed)
-----   Message from Viona Gilmore, [facility] sent at 04/20/2020 10:37 AM EDT ----- Regarding: CCM referral Hi again,  Can you please put in a CCM referral for [name]?  Thank you, Maddie

## 2020-04-21 ENCOUNTER — Other Ambulatory Visit: Payer: Self-pay

## 2020-04-21 ENCOUNTER — Ambulatory Visit: Payer: Medicare Other | Admitting: Pharmacist

## 2020-04-21 DIAGNOSIS — E785 Hyperlipidemia, unspecified: Secondary | ICD-10-CM

## 2020-04-21 DIAGNOSIS — I10 Essential (primary) hypertension: Secondary | ICD-10-CM

## 2020-04-21 NOTE — Chronic Care Management (AMB) (Signed)
Chronic Care Management Pharmacy  Name: Jesus Jordan  MRN: [id number] DOB: [birth year]  Initial Planning Appointment: completed 04/20/20  Initial Questions: 1. Have you seen any other providers since your last visit? n/a 2. Any changes in your medicines or health? Yes - Donepezil increased to 10 mg at bedtime  Chief Complaint/ HPI  [name],  74 y.o. , male presents for their Initial CCM visit with the clinical pharmacist In office.  PCP : Eulas Post, MD  Their chronic conditions include: HTN, HLD, CAD, Gout, depression, memory loss, GERD  Office Visits: -03/06/20 [name], MD: Patient presented for memory loss follow up. Patient scored 24/30 on MMSE and started Aricept 5 mg daily at last visit. Increased to 10 mg daily. Referral for neuropsychologist to confirm diagnosis. Patient is eating fairly regularly but has noted some weight loss. Patient is drinking Ensure. Patient has tried Remeron in the past without success. Received influenza vaccine in office. Follow up in 3 months.  -02/10/20 [name], MD: Patient presented for 6 month follow up and concerns about memory loss. Lab work stable and TSH and vit B12 were WNL. Patient has significant family history and has noted changes over the last year. Prescribed Aricept 5 mg daily. Follow up in 1 month.  Consult Visit: -09/24/19 Danella Sensing (dermatology): Patient presented for follow up. Unable to access notes.  -09/10/19 Shelva Majestic, MD (cardiology): Patient presented for follow up for CAD and HLD and re-evaluation of  Intermittent left-side chest pain . BP is stable without orthostatic change. Continued current medications. Follow up in 6 months.  Medications: Outpatient Encounter Medications as of 04/21/2020  Medication Sig  . allopurinol (ZYLOPRIM) 300 MG tablet Take 1 tablet by mouth once daily  . aspirin 81 MG tablet Take 81 mg by mouth at bedtime.   . calcium carbonate (TUMS - DOSED IN MG ELEMENTAL CALCIUM)  500 MG chewable tablet Chew 1-2 tablets by mouth as needed for indigestion or heartburn.  . carvedilol (COREG) 12.5 MG tablet Take 12.5 mg by mouth in the morning and 12.5 mg in the evening  . desoximetasone (TOPICORT) 0.25 % cream APPLY TWICE A DAY AS DIRECTED (Patient taking differently: Apply 1 application topically See admin instructions. Apply to affected areas of face 2 times a day as directed)  . donepezil (ARICEPT) 10 MG tablet Take 1 tablet (10 mg total) by mouth at bedtime.  Marland Kitchen ezetimibe (ZETIA) 10 MG tablet Take 1 tablet by mouth once daily (Patient taking differently: Take 10 mg by mouth at bedtime. )  . fluticasone (CUTIVATE) 0.05 % cream Apply 1 application topically daily as needed (to affected areas of face).   . Milk Thistle 1000 MG CAPS Take 1 capsule by mouth daily.  . nitroGLYCERIN (NITROSTAT) 0.4 MG SL tablet DISSOLVE ONE TABLET UNDER THE TONGUE EVERY 5 MINUTES AS NEEDED FOR CHEST PAIN.  DO NOT EXCEED A TOTAL OF 3 DOSES IN 15 MINUTES...CALL 911  . pantoprazole (PROTONIX) 40 MG tablet Take 1 tablet (40 mg total) by mouth daily as needed.  . simvastatin (ZOCOR) 40 MG tablet TAKE 1 TABLET BY MOUTH AT BEDTIME  . tetrahydrozoline-zinc (VISINE-AC) 0.05-0.25 % ophthalmic solution Place 2 drops into both eyes 3 (three) times daily as needed (for itching ot redness).   . clopidogrel (PLAVIX) 75 MG tablet Take 1 tablet by mouth once daily (Patient not taking: Reported on 04/21/2020)  . mirtazapine (REMERON) 15 MG tablet Take 1 tablet (15 mg total) by mouth at bedtime. (Patient  not taking: Reported on 03/06/2020)  . Multiple Vitamins-Minerals (ONE-A-DAY MENS 50+ ADVANTAGE) TABS Take 1 tablet by mouth 3 (three) times a week.  (Patient not taking: Reported on 03/06/2020)   No facility-administered encounter medications on file as of 04/21/2020.   Patient current lives alone and does not have much family in the area. His daughter is planning on moving back in from [place]. She currently works  at [employer] and is hoping to transfer to [employer]. His ex wife lives in [place] but he doesn't see her often.   Patient reports he does the cooking and cleaning at home. For breakfast he often eats fried eggs and reports getting takeout 2-3 times a week. He doesn't eat frozen food. For exercise, he plays indoor tennis 2-3 times a week and enjoys it. He currently denies any problems with driving.  He reports he generally feels he sleeps well but admits that it is sometimes hard to fall asleep. He does sleep about 7 hours per night and sometimes doesn't feel rested but does not take naps.  Patient currently feels good about his current medications and only having to take 6 per day. He stopped taking clopidogrel on his own because he noted more bruising and didn't think he needed it anymore.  Current Diagnosis/Assessment:  Goals Addressed            This Visit's Progress   . Pharmacy Care Plan       CARE PLAN ENTRY (see longitudinal plan of care for additional care plan information)  Current Barriers:  . Chronic Disease Management support, education, and care coordination needs related to Hypertension, Hyperlipidemia, Coronary Artery Disease, GERD, Depression, Gout, and memory loss   Hypertension BP Readings from Last 3 Encounters:  03/06/20 110/60  02/10/20 110/62  09/10/19 110/66   . Pharmacist Clinical Goal(s): o Over the next 14 days, patient will work with PharmD and providers to maintain BP goal <130/80 . Current regimen:  o Carvedilol 12.5 mg 1 tablet twice daily . Interventions: o Discussed the importance of checking blood pressure at home regularly o Added carvedilol evening dose to pillbox o Discussed DASH eating plan recommendations: . Emphasizes vegetables, fruits, and whole-grains . Includes fat-free or low-fat dairy products, fish, poultry, beans, nuts, and vegetable oils . Limits foods that are high in saturated fat. These foods include fatty meats, full-fat dairy  products, and tropical oils such as coconut, palm kernel, and palm oils. . Limits sugar-sweetened beverages and sweets . Limiting sodium intake to < 1500 mg/day . Patient self care activities - Over the next 14 days, patient will: o Check BP daily, document, and provide at future appointments o Ensure daily salt intake < 2300 mg/day  Hyperlipidemia Lab Results  Component Value Date/Time   LDLCALC 49 02/10/2020 11:48 AM   LDLCALC 54 03/06/2013 10:39 AM   . Pharmacist Clinical Goal(s): o Over the next 90 days, patient will work with PharmD and providers to maintain LDL goal < 70 . Current regimen:  . Simvastatin 40 mg 1 tablet daily . Ezetimibe 10 mg 1 tablet daily . Interventions: o Discussed lowering cholesterol through diet by: Marland Kitchen Limiting foods with cholesterol such as liver and other organ meats, egg yolks, shrimp, and whole milk dairy products . Avoiding saturated fats and trans fats and incorporating healthier fats, such as lean meat, nuts, and unsaturated oils like canola and olive oils . Eating foods with soluble fiber such as whole-grain cereals such as oatmeal and oat bran, fruits such as  apples, bananas, oranges, pears, and prunes, legumes such as kidney beans, lentils, chick peas, black-eyed peas, and lima beans, and green leafy vegetables . Limiting alcohol intake . Patient self care activities - Over the next 90 days, patient will: o Continue current medications  CAD/History of heart attack . Pharmacist Clinical Goal(s): o Over the next 90 days, patient will work with PharmD and providers to prevent heart events . Current regimen:  . Aspirin 81 mg 1 tablet daily  . Clopidogrel 75 mg 1 tablet daily . Nitroglycerin 0.4 mg SL tablet PRN . Interventions: o Discussed the need to be on blood thinners to prevent future heart attacks and strokes o Reached our to Dr. Claiborne Billings to discuss the need for Plavix  o Discussed monitoring for signs of bleeding such as unexplained and  excessive bleeding from a cut or injury, easy or excessive bruising, blood in urine or stools, and nosebleeds without a known cause . Patient self care activities - Over the next 90 days, patient will: o Continue current medications  Gout . Pharmacist Clinical Goal(s) o Over the next 90 days, patient will work with PharmD and providers to prevent gout flare ups . Current regimen:  o Allopurinol 300 mg 1 tablet daily . Interventions: o Discussed avoiding/limiting foods with high purine content: . wild game, such as veal, venison, and duck . red meat . some seafood, including tuna, sardines, anchovies, herring, mussels, codfish, scallops, trout, and haddock . organ meat, such as liver, kidneys, and thymus glands, which are known as sweetbreads o Avoiding/limiting foods to allow the body to process purines more effectively: . High-fat foods: Fat holds uric acid in the kidneys, so a person should avoid fried foods, full-fat dairy products, rich desserts, and other high-fat items. . Alcohol: Beer and whiskey are high in purines, but some research shows that all alcohol consumption can raise uric acid levels. Alcohol also causes dehydration, which hampers the body's ability to flush out uric acid. . Sweetened beverages: Fructose is an ingredient in many sweetened beverages, including fruit juices and sodas, and consuming too much puts a person at risk for gout. . Patient self care activities - Over the next 90 days, patient will: o Continue current medications  Depression . Pharmacist Clinical Goal(s) o Over the next 30 days, patient will work with PharmD and providers to manage symptoms of depression . Current regimen:  o No medications . Interventions: o Discuss with Dr. [name] about restarting mirtazapine to help with mood and sleep . Patient self care activities - Over the next 30 days, patient will: o Continue without medications  Memory loss . Pharmacist Clinical Goal(s) o Over  the next 90 days, patient will work with PharmD and providers to preserve memory  . Current regimen:  o Donepezil 10 mg 1 tablet at bedtime . Interventions: o Discussed side effects of donepezil and safety profile . Patient self care activities - Over the next 90 days, patient will: o Continue current medications  GERD . Pharmacist Clinical Goal(s) o Over the next 90 days, patient will work with PharmD and providers to manage symptoms of heart burn . Current regimen:  . Tums 500 mg 1-2 tablets as needed . Pantoprazole 40 mg 1 tablet daily as needed . Interventions: o Discussed non-pharmacologic management of symptoms such as elevating the head of your bed, avoiding eating 2-3 hours before bed, avoiding triggering foods such as acidic, spicy, or fatty foods, eating smaller meals, and wearing clothes that are loose around the  waist . Patient self care activities - Over the next 90 days, patient will: o Continue current medications  Medication management . Pharmacist Clinical Goal(s): o Over the next 90 days, patient will work with PharmD and providers to achieve optimal medication adherence . Current pharmacy: [facility] . Interventions o Comprehensive medication review performed. o Continue current medication management strategy . Patient self care activities - Over the next 90 days, patient will: o Focus on medication adherence by adding carvedilol back into evening medications o Take medications as prescribed o Report any questions or concerns to PharmD and/or provider(s)  Initial goal documentation       SDOH Interventions     Most Recent Value  SDOH Interventions  Financial Strain Interventions Intervention Not Indicated  Transportation Interventions Intervention Not Indicated     Hypertension   BP goal is:  <130/80  Office blood pressures are  BP Readings from Last 3 Encounters:  03/06/20 110/60  02/10/20 110/62  09/10/19 110/66   Patient checks BP at home  never Patient home BP readings are ranging: 105/60  Patient has failed these meds in the past: none Patient is currently controlled on the following medications:  . Carvedilol 12.5 mg 1 tablet twice daily  We discussed diet and exercise extensively and the importance of checking blood pressure at home  -Patient was only taking carvedilol once daily as he was unaware it was twice daily -Discussed DASH eating plan recommendations: . Emphasizes vegetables, fruits, and whole-grains . Includes fat-free or low-fat dairy products, fish, poultry, beans, nuts, and vegetable oils . Limits foods that are high in saturated fat. These foods include fatty meats, full-fat dairy products, and tropical oils such as coconut, palm kernel, and palm oils. . Limits sugar-sweetened beverages and sweets . Limiting sodium intake to < 1500 mg/day   Plan Check BP daily and keep a log. Follow up for BP assessment with CPA in 2 weeks. Start taking carvedilol twice daily - added second dose to evening pill box. Continue current medications.   Hyperlipidemia   LDL goal < 70  Last lipids Lab Results  Component Value Date   CHOL 140 02/10/2020   HDL 76 02/10/2020   LDLCALC 49 02/10/2020   TRIG 66 02/10/2020   CHOLHDL 1.8 02/10/2020   Hepatic Function Latest Ref Rng & Units 02/10/2020 12/09/2018 07/10/2017  Total Protein 6.1 - 8.1 g/dL 6.9 6.8 6.9  Albumin 3.5 - 5.0 g/dL - 4.0 4.4  AST 10 - 35 U/L '28 30 23  ' ALT 9 - 46 U/L '21 19 18  ' Alk Phosphatase 38 - 126 U/L - 66 65  Total Bilirubin 0.2 - 1.2 mg/dL 0.8 1.1 0.8  Bilirubin, Direct 0.0 - 0.2 mg/dL 0.2 - 0.1     The ASCVD Risk score ([place]., et al., 2013) failed to calculate for the following reasons:   The patient has a prior MI or stroke diagnosis   Patient has failed these meds in past: none Patient is currently controlled on the following medications:  . Simvastatin 40 mg 1 tablet daily . Ezetimibe 10 mg 1 tablet daily  We discussed:  diet and  exercise extensively  Plan  Continue current medications  CAD/hx of MI   Patient has failed these meds in past: none Patient is currently controlled on the following medications:  . Aspirin 81 mg 1 tablet daily in PM . Clopidogrel 75 mg 1 tablet daily . Ezetimibe 10 mg 1 tablet daily . Simvastatin 40 mg 1 tablet  daily . Nitroglycerin 0.4 mg SL tablet PRN  We discussed:  Monitoring for signs of bleeding such as unexplained and excessive bleeding from a cut or injury, easy or excessive bruising, blood in urine or stools, and nosebleeds without a known cause; patient stopped taking Plavix on his own about a month ago due to concerns of bruising and dizziness  Plan Sent message to cardiology to ask about Plavix use and if it can be stopped.  Continue current medications   Gout   Patient denies any gout flare-ups recently.   Lab Results  Component Value Date   LABURIC 5.8 01/29/2009   LABURIC 7.0 09/16/2008   Patient is currently controlled on:  . Allopurinol 300 mg 1 tablet daily  We discussed: avoiding purine foods & alcohol; patient's daughter reports he does consume a lot of beer in the evening  Plan  Continue current medications  Depression   Depression screen [facility] 2/9 04/29/2019 04/18/2018 03/21/2016  Decreased Interest 1 1 0  Down, Depressed, Hopeless 0 1 0  PHQ - 2 Score 1 2 0  Altered sleeping - 1 -  Tired, decreased energy - 0 -  Change in appetite - 1 -  Feeling bad or failure about yourself  - 0 -  Trouble concentrating - 1 -  Moving slowly or fidgety/restless - 0 -  Suicidal thoughts - 0 -  PHQ-9 Score - 5 -  Difficult doing work/chores - Somewhat difficult -    Patient has failed these meds in past: unknown Patient is currently uncontrolled on the following medications:  Marland Kitchen Mirtazapine 15 mg 1 tablet at bedtime - patient is not taking  We discussed:  Patient was unsure if he ever consistently took this medication  Plan Will discuss with PCP about  restarting mirtazapine.  Memory loss   Patient has failed these meds in past: none Patient is currently controlled on the following medications:  . Donepezil 10 mg 1 tablet at bedtime  We discussed:  Side effects with donepezil as patient was concerned after reading package insert  Plan  Continue current medications   GERD   Patient has failed these meds in past: none Patient is currently controlled on the following medications:  . Tums 500 mg 1-2 tablets as needed . Pantoprazole 40 mg 1 tablet daily as needed  We discussed:  Non-pharmacologic management of symptoms such as elevating the head of your bed, avoiding eating 2-3 hours before bed, avoiding triggering foods such as acidic, spicy, or fatty foods, eating smaller meals, and wearing clothes that are loose around the waist   Plan  Continue current medications    Miscellaneous   Patient is currently on the following medications:  . Desoximetasone 0.25% cream apply twice daily as directed . Fluticasone 0.05% cream apply daily as needed . Visine AC 0.05%-0.25% apply 2 drops in both eyes three times daily  . Milk thistle 1000 mg 1 capsule daily  Plan  Continue current medications  Vaccines   Reviewed and discussed patient's vaccination history.    Immunization History  Administered Date(s) Administered  . Fluad Quad(high Dose 65+) 02/08/2019, 03/06/2020  . Influenza, High Dose Seasonal PF 04/30/2013, 05/12/2014, 05/12/2015, 03/21/2016, 04/20/2017  . Influenza-Unspecified 03/13/2018, 02/19/2019  . Pneumococcal Conjugate-13 01/14/2015, 03/13/2018  . Pneumococcal Polysaccharide-23 01/07/2010, 03/21/2016  . Pneumococcal-Unspecified 05/24/2019  . Td 06/20/1993, 01/07/2010  . Zoster 02/14/2012   Patient reported getting COVID vaccine but unsure of exact dates.   Plan  Recommended patient receive Shingles and tetanus vaccine  in office/at pharmacy.   Medication Management   Pt uses [facility] pharmacy for  all medications Uses pill box? Yes - daughter fills the boxes Pt endorses 80% compliance - once a week   We discussed: Discussed benefits of medication synchronization, packaging and delivery as well as enhanced pharmacist oversight with [facility].   Plan  Continue current medication management strategy Price out medications for [facility] vs [facility]. [name] reported she is willing to pay the difference.  Follow up: 3 month phone visit 2 week BP follow up with CPA.  [name], PharmD Clinical Pharmacist [facility] [place]

## 2020-04-24 ENCOUNTER — Telehealth: Payer: Self-pay | Admitting: Family Medicine

## 2020-04-24 ENCOUNTER — Encounter: Payer: Medicare Other | Admitting: Psychology

## 2020-04-24 NOTE — Telephone Encounter (Signed)
Unable to leave message for patient to schedule Annual Wellness Visit.  Please schedule with Nurse Health Advisor Ofilia Neas, RN at [facility]

## 2020-04-28 MED ORDER — MIRTAZAPINE 15 MG PO TABS: 15.0000 mg | ORAL_TABLET | Freq: Every day | ORAL | 3 refills | Status: DC

## 2020-04-28 NOTE — Addendum Note (Signed)
Addended by: Eulas Post on: 04/28/2020 08:24 PM   Modules accepted: Orders

## 2020-05-01 ENCOUNTER — Encounter: Payer: Medicare Other | Admitting: Psychology

## 2020-05-05 ENCOUNTER — Telehealth: Payer: Self-pay | Admitting: Cardiovascular Disease

## 2020-05-05 NOTE — Telephone Encounter (Signed)
Pt c/o medication issue:  1. Name of Medication: clopidogrel (PLAVIX) 75 MG tablet  2. How are you currently taking this medication (dosage and times per day)? Pt has not taken it for about a month   3. Are you having a reaction (difficulty breathing--STAT)? no  4. What is your medication issue? Maddie, the pharmacist at Dr. Erick Blinks office wanted to make sure Dr. Claiborne Billings was aware that the patient stopped taking this medication. The patient told Maddie that the medication made him dizzy and decided to stop the medication on his own. The patient is still taking the baby aspirin.  The number provided is a direct number to [facility] the pharmacist

## 2020-05-06 ENCOUNTER — Telehealth: Payer: Self-pay | Admitting: Pharmacist

## 2020-05-06 NOTE — Chronic Care Management (AMB) (Signed)
I was unable to reach the patient by phone with the numbers that were provided in his chart. I left a message with his daughter to please give me a call back.  Maia Breslow, [place] Assistant [phone]

## 2020-05-06 NOTE — Addendum Note (Signed)
Addended by: Viona Gilmore on: 05/06/2020 12:03 PM   Modules accepted: Orders

## 2020-05-07 NOTE — Chronic Care Management (AMB) (Signed)
2nd attempt to reach patient by phone. Left a message on daughter's number.  Maia Breslow, [place] Assistant [phone]

## 2020-05-08 ENCOUNTER — Other Ambulatory Visit: Payer: Self-pay | Admitting: Family Medicine

## 2020-05-08 DIAGNOSIS — Z23 Encounter for immunization: Secondary | ICD-10-CM | POA: Diagnosis not present

## 2020-05-08 NOTE — Chronic Care Management (AMB) (Signed)
Chronic Care Management Pharmacy Assistant   Name: Jesus Jordan  MRN: [id number] DOB: [birth year]  Reason for Encounter: Disease State/Hypertension  Adherence Call  PCP : Eulas Post, MD  Allergies:   Allergies  Allergen Reactions  . Imdur [Isosorbide Nitrate] Other (See Comments)    Caused vertigo  . Lisinopril Other (See Comments)    Possible vertigo??    Medications: Outpatient Encounter Medications as of 05/06/2020  Medication Sig  . allopurinol (ZYLOPRIM) 300 MG tablet Take 1 tablet by mouth once daily  . aspirin 81 MG tablet Take 81 mg by mouth at bedtime.   . calcium carbonate (TUMS - DOSED IN MG ELEMENTAL CALCIUM) 500 MG chewable tablet Chew 1-2 tablets by mouth as needed for indigestion or heartburn.  . carvedilol (COREG) 12.5 MG tablet Take 12.5 mg by mouth in the morning and 12.5 mg in the evening  . clopidogrel (PLAVIX) 75 MG tablet Take 1 tablet by mouth once daily (Patient not taking: Reported on 04/21/2020)  . desoximetasone (TOPICORT) 0.25 % cream APPLY TWICE A DAY AS DIRECTED (Patient taking differently: Apply 1 application topically See admin instructions. Apply to affected areas of face 2 times a day as directed)  . donepezil (ARICEPT) 10 MG tablet Take 1 tablet (10 mg total) by mouth at bedtime.  Marland Kitchen ezetimibe (ZETIA) 10 MG tablet Take 1 tablet by mouth once daily (Patient taking differently: Take 10 mg by mouth at bedtime. )  . fluticasone (CUTIVATE) 0.05 % cream Apply 1 application topically daily as needed (to affected areas of face).   . Milk Thistle 1000 MG CAPS Take 1 capsule by mouth daily.  . mirtazapine (REMERON) 15 MG tablet Take 1 tablet (15 mg total) by mouth at bedtime. (Patient not taking: Reported on 03/06/2020)  . mirtazapine (REMERON) 15 MG tablet Take 1 tablet (15 mg total) by mouth at bedtime.  . nitroGLYCERIN (NITROSTAT) 0.4 MG SL tablet DISSOLVE ONE TABLET UNDER THE TONGUE EVERY 5 MINUTES AS NEEDED FOR CHEST PAIN.  DO NOT EXCEED A TOTAL OF  3 DOSES IN 15 MINUTES...CALL 911  . pantoprazole (PROTONIX) 40 MG tablet Take 1 tablet (40 mg total) by mouth daily as needed.  . simvastatin (ZOCOR) 40 MG tablet TAKE 1 TABLET BY MOUTH AT BEDTIME  . tetrahydrozoline-zinc (VISINE-AC) 0.05-0.25 % ophthalmic solution Place 2 drops into both eyes 3 (three) times daily as needed (for itching ot redness).    No facility-administered encounter medications on file as of 05/06/2020.    Current Diagnosis: Patient Active Problem List   Diagnosis Date Noted  . Cognitive impairment 03/06/2020  . COPD (chronic obstructive pulmonary disease) ([place]) 12/10/2018  . Essential hypertension 12/10/2018  . Chest pain 12/09/2018  . GERD (gastroesophageal reflux disease) 04/18/2018  . CAD in native artery 05/03/2016  . Old MI (myocardial infarction) 03/06/2013  . Hyperlipidemia with target LDL less than 70 03/06/2013  . Inguinal hernia 05/25/2009  . Gout 01/29/2009  . Coronary atherosclerosis 01/29/2009    Goals Addressed   None    Reviewed chart prior to disease state call. Spoke with patient regarding BP  Recent Office Vitals: BP Readings from Last 3 Encounters:  03/06/20 110/60  02/10/20 110/62  09/10/19 110/66   Pulse Readings from Last 3 Encounters:  03/06/20 67  02/10/20 65  09/10/19 (!) 58    Wt Readings from Last 3 Encounters:  03/06/20 135 lb 3.2 oz (61.3 kg)  02/10/20 136 lb (61.7 kg)  09/10/19 140 lb (63.5 kg)  Kidney Function Lab Results  Component Value Date/Time   CREATININE 0.90 02/10/2020 11:48 AM   CREATININE 0.97 08/28/2019 03:54 PM   CREATININE 0.96 12/09/2018 08:04 PM   CREATININE 1.07 03/18/2014 10:51 AM   GFR 95.90 07/10/2017 03:45 PM   GFRNONAA >60 08/28/2019 03:54 PM   GFRAA >60 08/28/2019 03:54 PM    BMP Latest Ref Rng & Units 02/10/2020 08/28/2019 12/09/2018  Glucose 65 - 99 mg/dL 74 112(H) 155(H)  BUN 7 - 25 mg/dL 20 15 16   Creatinine 0.70 - 1.18 mg/dL 0.90 0.97 0.96  BUN/Creat Ratio 6 - 22 (calc) NOT  APPLICABLE - -  Sodium 174 - 146 mmol/L 139 137 134(L)  Potassium 3.5 - 5.3 mmol/L 4.6 4.0 3.9  Chloride 98 - 110 mmol/L 104 101 103  CO2 20 - 32 mmol/L 27 26 22   Calcium 8.6 - 10.3 mg/dL 9.3 9.4 9.2    . Current antihypertensive regimen:  o Carvedilol 12.5 mg tablets twice a day . How often are you checking your Blood Pressure? twice daily . Current home BP readings: 98/60, after working out average was 110/69 . What recent interventions/DTPs have been made by any provider to improve Blood Pressure control since last CPP Visit: None . Any recent hospitalizations or ED visits since last visit with CPP? No . What diet changes have been made to improve Blood Pressure Control?  o He continues on the same diet. . What exercise is being done to improve your Blood Pressure Control?  o He continues to play tennis.  Adherence Review: Is the patient currently on ACE/ARB medication? No Does the patient have >5 day gap between last estimated fill dates? No   Follow-Up:  Pharmacist Review  I spoke with the patient's daughter Jesus Jordan on HIPPA). She states he has been doing well overall. He was taking the Carvedilol 12.5 mg as directed But the medication was making him feel very dizzy and lethargic. His daughter states that she was very concerned about him being this way since he lives by himself. He took the medication this way for 1.5 weeks and then he stopped. Since then, he has only been taking the medication in the morning. She states when he was taking the medication twice a day his top numbers were averaging in the 80s and bottom numbers in the 50s. She states even after tennis his blood pressure is about 110/69.   Jesus Jordan, [place] Assistant [phone]

## 2020-05-10 NOTE — Telephone Encounter (Signed)
Refills OK. 

## 2020-05-18 ENCOUNTER — Telehealth: Payer: Self-pay | Admitting: Family Medicine

## 2020-05-18 MED ORDER — METOPROLOL SUCCINATE ER 25 MG PO TB24: 25.0000 mg | ORAL_TABLET | Freq: Every day | ORAL | 3 refills | Status: DC

## 2020-05-18 NOTE — Patient Instructions (Addendum)
Hi Jesus Jordan,  It was such a pleasure to get to speak with you in the office! Below is a summary of some of the things we discussed. We will plan to follow up again soon to check in on your blood pressure. Please go ahead and switch from carvedilol to metoprolol as Dr. [name] sent in a new prescription.  Please give me a call if you have any questions or need anything before our next visit in office on 12/9.  Best, Maddie  [name], PharmD [facility] Clinical Pharmacist [place] [facility] [place]    Visit Information  Goals Addressed            This Visit's Progress   . Pharmacy Care Plan       CARE PLAN ENTRY (see longitudinal plan of care for additional care plan information)  Current Barriers:  . Chronic Disease Management support, education, and care coordination needs related to Hypertension, Hyperlipidemia, Coronary Artery Disease, GERD, Depression, Gout, and memory loss   Hypertension BP Readings from Last 3 Encounters:  03/06/20 110/60  02/10/20 110/62  09/10/19 110/66   . Pharmacist Clinical Goal(s): o Over the next 14 days, patient will work with PharmD and providers to maintain BP goal <130/80 . Current regimen:  o Carvedilol 12.5 mg 1 tablet twice daily . Interventions: o Discussed the importance of checking blood pressure at home regularly o Added carvedilol evening dose to pillbox o Discussed DASH eating plan recommendations: . Emphasizes vegetables, fruits, and whole-grains . Includes fat-free or low-fat dairy products, fish, poultry, beans, nuts, and vegetable oils . Limits foods that are high in saturated fat. These foods include fatty meats, full-fat dairy products, and tropical oils such as coconut, palm kernel, and palm oils. . Limits sugar-sweetened beverages and sweets . Limiting sodium intake to < 1500 mg/day . Patient self care activities - Over the next 14 days, patient will: o Check BP daily, document, and provide at  future appointments o Ensure daily salt intake < 2300 mg/day  Hyperlipidemia Lab Results  Component Value Date/Time   LDLCALC 49 02/10/2020 11:48 AM   LDLCALC 54 03/06/2013 10:39 AM   . Pharmacist Clinical Goal(s): o Over the next 90 days, patient will work with PharmD and providers to maintain LDL goal < 70 . Current regimen:  . Simvastatin 40 mg 1 tablet daily . Ezetimibe 10 mg 1 tablet daily . Interventions: o Discussed lowering cholesterol through diet by: Marland Kitchen Limiting foods with cholesterol such as liver and other organ meats, egg yolks, shrimp, and whole milk dairy products . Avoiding saturated fats and trans fats and incorporating healthier fats, such as lean meat, nuts, and unsaturated oils like canola and olive oils . Eating foods with soluble fiber such as whole-grain cereals such as oatmeal and oat bran, fruits such as apples, bananas, oranges, pears, and prunes, legumes such as kidney beans, lentils, chick peas, black-eyed peas, and lima beans, and green leafy vegetables . Limiting alcohol intake . Patient self care activities - Over the next 90 days, patient will: o Continue current medications  CAD/History of heart attack . Pharmacist Clinical Goal(s): o Over the next 90 days, patient will work with PharmD and providers to prevent heart events . Current regimen:  . Aspirin 81 mg 1 tablet daily  . Clopidogrel 75 mg 1 tablet daily . Nitroglycerin 0.4 mg SL tablet PRN . Interventions: o Discussed the need to be on blood thinners to prevent future heart attacks and strokes o Reached our  to Dr. Claiborne Billings to discuss the need for Plavix  o Discussed monitoring for signs of bleeding such as unexplained and excessive bleeding from a cut or injury, easy or excessive bruising, blood in urine or stools, and nosebleeds without a known cause . Patient self care activities - Over the next 90 days, patient will: o Continue current medications  Gout . Pharmacist Clinical Goal(s) o Over  the next 90 days, patient will work with PharmD and providers to prevent gout flare ups . Current regimen:  o Allopurinol 300 mg 1 tablet daily . Interventions: o Discussed avoiding/limiting foods with high purine content: . wild game, such as veal, venison, and duck . red meat . some seafood, including tuna, sardines, anchovies, herring, mussels, codfish, scallops, trout, and haddock . organ meat, such as liver, kidneys, and thymus glands, which are known as sweetbreads o Avoiding/limiting foods to allow the body to process purines more effectively: . High-fat foods: Fat holds uric acid in the kidneys, so a person should avoid fried foods, full-fat dairy products, rich desserts, and other high-fat items. . Alcohol: Beer and whiskey are high in purines, but some research shows that all alcohol consumption can raise uric acid levels. Alcohol also causes dehydration, which hampers the body's ability to flush out uric acid. . Sweetened beverages: Fructose is an ingredient in many sweetened beverages, including fruit juices and sodas, and consuming too much puts a person at risk for gout. . Patient self care activities - Over the next 90 days, patient will: o Continue current medications  Depression . Pharmacist Clinical Goal(s) o Over the next 30 days, patient will work with PharmD and providers to manage symptoms of depression . Current regimen:  o No medications . Interventions: o Discuss with Dr. [name] about restarting mirtazapine to help with mood and sleep . Patient self care activities - Over the next 30 days, patient will: o Continue without medications  Memory loss . Pharmacist Clinical Goal(s) o Over the next 90 days, patient will work with PharmD and providers to preserve memory  . Current regimen:  o Donepezil 10 mg 1 tablet at bedtime . Interventions: o Discussed side effects of donepezil and safety profile . Patient self care activities - Over the next 90 days, patient  will: o Continue current medications  GERD . Pharmacist Clinical Goal(s) o Over the next 90 days, patient will work with PharmD and providers to manage symptoms of heart burn . Current regimen:  . Tums 500 mg 1-2 tablets as needed . Pantoprazole 40 mg 1 tablet daily as needed . Interventions: o Discussed non-pharmacologic management of symptoms such as elevating the head of your bed, avoiding eating 2-3 hours before bed, avoiding triggering foods such as acidic, spicy, or fatty foods, eating smaller meals, and wearing clothes that are loose around the waist . Patient self care activities - Over the next 90 days, patient will: o Continue current medications  Medication management . Pharmacist Clinical Goal(s): o Over the next 90 days, patient will work with PharmD and providers to achieve optimal medication adherence . Current pharmacy: [facility] . Interventions o Comprehensive medication review performed. o Continue current medication management strategy . Patient self care activities - Over the next 90 days, patient will: o Focus on medication adherence by adding carvedilol back into evening medications o Take medications as prescribed o Report any questions or concerns to PharmD and/or provider(s)  Initial goal documentation        Mr. Reede was given information about  Chronic Care Management services today including:  1. CCM service includes personalized support from designated clinical staff supervised by his physician, including individualized plan of care and coordination with other care providers 2. 24/7 contact phone numbers for assistance for urgent and routine care needs. 3. Standard insurance, coinsurance, copays and deductibles apply for chronic care management only during months in which we provide at least 20 minutes of these services. Most insurances cover these services at 100%, however patients may be responsible for any copay, coinsurance and/or deductible if  applicable. This service may help you avoid the need for more expensive face-to-face services. 4. Only one practitioner may furnish and bill the service in a calendar month. 5. The patient may stop CCM services at any time (effective at the end of the month) by phone call to the office staff.  Patient agreed to services and verbal consent obtained.   The patient verbalized understanding of instructions, educational materials, and care plan provided today and agreed to receive a mailed copy of patient instructions, educational materials, and care plan.  Telephone follow up appointment with pharmacy team member scheduled for: 2 weeks   How to Take Your Blood Pressure Blood pressure is a measurement of how strongly your blood is pressing against the walls of your arteries. Arteries are blood vessels that carry blood from your heart throughout your body. Your health care provider takes your blood pressure at each office visit. You can also take your own blood pressure at home with a blood pressure machine. You may need to take your own blood pressure:  To confirm a diagnosis of high blood pressure (hypertension).  To monitor your blood pressure over time.  To make sure your blood pressure medicine is working. Supplies needed: To take your blood pressure, you will need a blood pressure machine. You can buy a blood pressure machine, or blood pressure monitor, at most drugstores or online. There are several types of home blood pressure monitors. When choosing one, consider the following:  Choose a monitor that has an arm cuff.  Choose a cuff that wraps snugly around your upper arm. You should be able to fit only one finger between your arm and the cuff.  Do not choose a monitor that measures your blood pressure from your wrist or finger. Your health care provider can suggest a reliable monitor that will meet your needs. How to prepare To get the most accurate reading, avoid the following for 30  minutes before you check your blood pressure:  Drinking caffeine.  Drinking alcohol.  Eating.  Smoking.  Exercising. Five minutes before you check your blood pressure:  Empty your bladder.  Sit quietly without talking in a dining chair, rather than in a soft couch or armchair. How to take your blood pressure To check your blood pressure, follow the instructions in the manual that came with your blood pressure monitor. If you have a digital blood pressure monitor, the instructions may be as follows: 1. Sit up straight. 2. Place your feet on the floor. Do not cross your ankles or legs. 3. Rest your left arm at the level of your heart on a table or desk or on the arm of a chair. 4. Pull up your shirt sleeve. 5. Wrap the blood pressure cuff around the upper part of your left arm, 1 inch (2.5 cm) above your elbow. It is best to wrap the cuff around bare skin. 6. Fit the cuff snugly around your arm. You should be able to  place only one finger between the cuff and your arm. 7. Position the cord inside the groove of your elbow. 8. Press the power button. 9. Sit quietly while the cuff inflates and deflates. 10. Read the digital reading on the monitor screen and write it down (record it). 11. Wait 2-3 minutes, then repeat the steps, starting at step 1. What does my blood pressure reading mean? A blood pressure reading consists of a higher number over a lower number. Ideally, your blood pressure should be below 120/80. The first ("top") number is called the systolic pressure. It is a measure of the pressure in your arteries as your heart beats. The second ("bottom") number is called the diastolic pressure. It is a measure of the pressure in your arteries as the heart relaxes. Blood pressure is classified into four stages. The following are the stages for adults who do not have a short-term serious illness or a chronic condition. Systolic pressure and diastolic pressure are measured in a unit  called mm Hg. Normal  Systolic pressure: below 861.  Diastolic pressure: below 80. Elevated  Systolic pressure: 683-729.  Diastolic pressure: below 80. Hypertension stage 1  Systolic pressure: 021-115.  Diastolic pressure: 52-08. Hypertension stage 2  Systolic pressure: 022 or above.  Diastolic pressure: 90 or above. You can have prehypertension or hypertension even if only the systolic or only the diastolic number in your reading is higher than normal. Follow these instructions at home:  Check your blood pressure as often as recommended by your health care provider.  Take your monitor to the next appointment with your health care provider to make sure: ? That you are using it correctly. ? That it provides accurate readings.  Be sure you understand what your goal blood pressure numbers are.  Tell your health care provider if you are having any side effects from blood pressure medicine. Contact a health care provider if:  Your blood pressure is consistently high. Get help right away if:  Your systolic blood pressure is higher than 180.  Your diastolic blood pressure is higher than 110. This information is not intended to replace advice given to you by your health care provider. Make sure you discuss any questions you have with your health care provider. Document Revised: 05/19/2017 Document Reviewed: 11/13/2015 Elsevier Patient Education  2020 [employer].

## 2020-05-18 NOTE — Telephone Encounter (Signed)
Patient had recent visit with our clinical pharmacist.  Jesus Jordan has some cognitive impairment and has struggled with medication compliance.  His daughter is assisting.  We will switch from carvedilol which is a twice daily drug to metoprolol succinate 25 mg once daily with new prescription sent

## 2020-05-27 ENCOUNTER — Telehealth: Payer: Self-pay | Admitting: Pharmacist

## 2020-05-27 NOTE — Chronic Care Management (AMB) (Signed)
I left the patient a message about his upcoming appointment on 05-28-2020 @ 3:30 pm with the clinical pharmacist. He was asked to please have all medication on hand to review the pharmacist.   Maia Breslow, [place] Assistant [phone]

## 2020-05-28 ENCOUNTER — Ambulatory Visit: Payer: Medicare Other

## 2020-05-29 ENCOUNTER — Ambulatory Visit (INDEPENDENT_AMBULATORY_CARE_PROVIDER_SITE_OTHER): Payer: Medicare Other | Admitting: Psychology

## 2020-05-29 ENCOUNTER — Ambulatory Visit: Payer: Medicare Other | Admitting: Pharmacist

## 2020-05-29 ENCOUNTER — Other Ambulatory Visit: Payer: Self-pay

## 2020-05-29 ENCOUNTER — Ambulatory Visit: Payer: Medicare Other | Admitting: Psychology

## 2020-05-29 ENCOUNTER — Encounter: Payer: Self-pay | Admitting: Psychology

## 2020-05-29 DIAGNOSIS — H9190 Unspecified hearing loss, unspecified ear: Secondary | ICD-10-CM | POA: Insufficient documentation

## 2020-05-29 DIAGNOSIS — F1010 Alcohol abuse, uncomplicated: Secondary | ICD-10-CM | POA: Insufficient documentation

## 2020-05-29 DIAGNOSIS — F0280 Dementia in other diseases classified elsewhere without behavioral disturbance: Secondary | ICD-10-CM

## 2020-05-29 DIAGNOSIS — I10 Essential (primary) hypertension: Secondary | ICD-10-CM

## 2020-05-29 DIAGNOSIS — E785 Hyperlipidemia, unspecified: Secondary | ICD-10-CM

## 2020-05-29 DIAGNOSIS — R4189 Other symptoms and signs involving cognitive functions and awareness: Secondary | ICD-10-CM

## 2020-05-29 HISTORY — DX: Dementia in other diseases classified elsewhere, unspecified severity, without behavioral disturbance, psychotic disturbance, mood disturbance, and anxiety: F02.80

## 2020-05-29 NOTE — Progress Notes (Addendum)
NEUROPSYCHOLOGICAL EVALUATION . [facility] Department of Neurology  Date of Evaluation: May 29, 2020  Reason for Referral:   Jesus Jordan is a 74 y.o. right-handed Caucasian male referred by [name], M.D., to characterize his current cognitive functioning and assist with diagnostic clarity and treatment planning in the context of subjective cognitive decline and a history of several cardiovascular comorbidities.   Assessment and Plan:   Clinical Impression(s): Jesus Jordan pattern of performance is suggestive of fairly diffuse cognitive impairment with noted areas of dysfunction surrounding processing speed, complex attention, executive functioning, and learning and memory. Visuospatial abilities were mildly variable but overall far below expectation. Variability was also noted across semantic fluency and confrontation naming, while basic attention was intact. Jesus Jordan and his daughter alluded to him having difficulties completing instrumental activities of daily living (ADLs) independently, particularly surrounding medication management. This, coupled with evidence for significant cognitive dysfunction described above, suggests that he meets criteria for a Major Neurocognitive Disorder (formerly "dementia") at the present time.  The etiology of ongoing dysfunction is difficult to determine, largely due to the fairly diffuse nature of cognitive impairment. Considering base rates, Alzheimer's disease represents the most common form of neurodegenerative illness. Jesus Jordan cognitive profile does share many characteristics with the typical pattern of this condition, namely significant memory impairment with evidence for a memory storage deficit, as well as ongoing dysfunction surrounding executive functioning and visuospatial abilities. However, variability across semantic fluency and confrontation naming rather than consistent impairment is somewhat unexpected.  Additionally, a Lewy body dementia should remain on his differential. His cognitive performance (i.e., frontal subcortical dysfunction with pronounced visuospatial deficits) is consistent with this condition, as are reports of potential visual hallucinations and REM sleep behaviors. However, Alzheimer's disease is viewed as the more likely culprit at this time given the extent of memory loss. Finally, Jesus Jordan has a large number of cardiovascular ailments in his medical history. While there is no neuroimaging available to examine the presence of small vessel ischemic changes, I would expect there to be some. While a primary vascular etiology is unlikely, I do believe that there are vascular contributions to his current presentation, making a "mixed dementia" presentation very likely. Continued medical monitoring will be important moving forward.   Recommendations: I would recommend that Jesus Jordan refer Jesus Jordan for a brain MRI to examine and anatomical correlates for ongoing cognitive dysfunction. It may also be beneficial for Jesus Jordan to be followed by a neurologist given ongoing cognitive dysfunction.   Jesus Jordan has already been prescribed medication commonly given to those with memory concerns (i.e., Aricept/donepezil). While I encourage Jesus Jordan to continue taking this medication as prescribed, it is important to highlight that current medications cannot stop or reverse cognitive decline in the face of a neurodegenerative illness. In some individuals, they may slow functional decline.   The CDC defines heavy drinking in males as consuming greater than 15 alcoholic beverages per week. During the current interview, Jesus Jordan reported consuming a 6-pack per day. This calculates to 42 alcoholic beverages per week, far above the CDC threshold. Chronic and heavy alcohol consumption will have a negative effect on all aspects of [name] health, including cognitive functioning. It could also be interacting with his  prescribed medications. He is strongly encouraged to decrease his regular alcohol intake and ideally eliminate it entirely.    Broadly, performance across neurocognitive testing is not a strong predictor of an individual's safety operating a motor vehicle.  However, given the extent of cognitive deficits (especially surrounding processing speed, executive functioning, and visuospatial abilities), I would advise against Jesus Jordan operating a motorized vehicle at this time. Should his family wish to pursue a formalized driving evaluation, they would be encouraged to contact [facility] [employer] in [place], [place] at [phone]. Another option would be through [facility]; however, the latter would likely require a referral from a medical doctor. [facility] can be reached directly at [phone].   Should there be a progression of his current deficits over time, Jesus Jordan is unlikely to regain any independent living skills lost. Therefore, it is recommended that he remain as involved as possible in all aspects of household chores, finances, and medication management, with supervision to ensure adequate performance. He will likely benefit from the establishment and maintenance of a routine in order to maximize his functional abilities over time.  It will be important for Jesus Jordan to have another person with him when in situations where he may need to process information, weigh the pros and cons of different options, and make decisions, in order to ensure that he fully understands and recalls all information to be considered.  If not already done, Jesus Jordan and his family may want to discuss his wishes regarding durable power of attorney and medical decision making, so that he can have input into these choices. Additionally, they may wish to discuss future plans for caretaking and seek out community options for in home/residential care should they become necessary.  Information important to  remember should be provided in written format in all instances. This should be placed in a highly visible and commonly frequented area of his residence to help promote recall.   To address problems with processing speed, he may wish to consider:   -Ensuring that he is alerted when essential material or instructions are being presented   -Adjusting the speed at which new information is presented   -Allowing for more time in comprehending, processing, and responding in conversation  To address problems with fluctuating attention and executive dysfunction, he may wish to consider:   -Avoiding external distractions when needing to concentrate   -Limiting exposure to fast paced environments with multiple sensory demands   -Writing down complicated information and using checklists   -Attempting and completing one task at a time (i.e., no multi-tasking)   -Verbalizing aloud each step of a task to maintain focus   -Taking frequent breaks during the completion of steps/tasks to avoid fatigue   -Reducing the amount of information considered at one time  Review of Records:   Jesus Jordan was seen by [facility] [name], M.D.) on 03/06/2020 for follow-up of weight loss and cognitive impairment. Regarding the latter, Jesus Jordan noted that recent lab work for thyroid functioning and B12 was normal. Jesus Jordan was recently started on Aricept which he seemed to be tolerating well. Performance on a brief cognitive screening instrument (MMSE) was 24/30. While meeting with Jesus Jordan on 02/10/2020, it was reported that Mr. Caraway had become more forgetful and repetitious. He and his daughter expressed concerns surrounding dementia given that his sister recently passed away due to complications of this condition. Ultimately, Mr. Carneiro was referred for a comprehensive neuropsychological evaluation to characterize his cognitive abilities and to assist with diagnostic clarity and treatment planning.   No  neuroimaging was available for review.   Past Medical History:  Diagnosis Date  . Alcohol abuse, daily use  05/29/20 - Reported consuming a 6 pack per day  . CAD in native artery 05/03/2016  . COPD (chronic obstructive pulmonary disease) 12/10/2018  . Coronary atherosclerosis 01/29/2009  . Essential hypertension 12/10/2018  . GERD (gastroesophageal reflux disease) 04/18/2018  . Gout   . Hearing loss   . History of MI (myocardial infarction) 09/12/2008   large inferolateral MI secondary to total occlusion of circumflex with VF cardiac arrest  . History of nuclear stress test 02/23/2012   exercise myoview; area of scar in inferolateral wall at mid-basal region  . Hyperlipidemia 03/06/2013   target LDL less than 70  . Inguinal hernia 05/25/2009    Past Surgical History:  Procedure Laterality Date  . CORONARY STENT PLACEMENT  09/12/2008   r/t MI; 3.5x26mm Xience DES to circumflex & PTCA at multiple sites in distal region of vessel; alos diffusely diseaase, narrowed, nondominant RCA  . FOOT SURGERY  1960  . HERNIA REPAIR  2011   left inguinal hernia  . TRANSTHORACIC ECHOCARDIOGRAM  03/02/2009   GL=87-56%; LV systolic function borderline reduced with mild inferior wall hypokinesis; mild-mod MR; mild TR; AV mildly sclerotic; mild aortic root dilatation    Current Outpatient Medications:  .  allopurinol (ZYLOPRIM) 300 MG tablet, Take 1 tablet by mouth once daily, Disp: 90 tablet, Rfl: 0 .  aspirin 81 MG tablet, Take 81 mg by mouth at bedtime. , Disp: , Rfl:  .  calcium carbonate (TUMS - DOSED IN MG ELEMENTAL CALCIUM) 500 MG chewable tablet, Chew 1-2 tablets by mouth as needed for indigestion or heartburn., Disp: , Rfl:  .  clopidogrel (PLAVIX) 75 MG tablet, Take 1 tablet by mouth once daily (Patient not taking: Reported on 04/21/2020), Disp: 90 tablet, Rfl: 3 .  desoximetasone (TOPICORT) 0.25 % cream, APPLY TWICE A DAY AS DIRECTED, Disp: 15 g, Rfl: 0 .  donepezil (ARICEPT) 10 MG tablet,  Take 1 tablet (10 mg total) by mouth at bedtime., Disp: 90 tablet, Rfl: 3 .  ezetimibe (ZETIA) 10 MG tablet, Take 1 tablet by mouth once daily (Patient taking differently: Take 10 mg by mouth at bedtime. ), Disp: 90 tablet, Rfl: 3 .  fluticasone (CUTIVATE) 0.05 % cream, Apply 1 application topically daily as needed (to affected areas of face). , Disp: , Rfl:  .  metoprolol succinate (TOPROL-XL) 25 MG 24 hr tablet, Take 1 tablet (25 mg total) by mouth daily., Disp: 90 tablet, Rfl: 3 .  Milk Thistle 1000 MG CAPS, Take 1 capsule by mouth daily., Disp: , Rfl:  .  mirtazapine (REMERON) 15 MG tablet, Take 1 tablet (15 mg total) by mouth at bedtime. (Patient not taking: Reported on 03/06/2020), Disp: 30 tablet, Rfl: 5 .  mirtazapine (REMERON) 15 MG tablet, Take 1 tablet (15 mg total) by mouth at bedtime., Disp: 30 tablet, Rfl: 3 .  nitroGLYCERIN (NITROSTAT) 0.4 MG SL tablet, DISSOLVE ONE TABLET UNDER THE TONGUE EVERY 5 MINUTES AS NEEDED FOR CHEST PAIN.  DO NOT EXCEED A TOTAL OF 3 DOSES IN 15 MINUTES...CALL 911, Disp: 25 tablet, Rfl: 3 .  pantoprazole (PROTONIX) 40 MG tablet, Take 1 tablet (40 mg total) by mouth daily as needed., Disp: 30 tablet, Rfl: 4 .  simvastatin (ZOCOR) 40 MG tablet, TAKE 1 TABLET BY MOUTH AT BEDTIME, Disp: 90 tablet, Rfl: 3 .  tetrahydrozoline-zinc (VISINE-AC) 0.05-0.25 % ophthalmic solution, Place 2 drops into both eyes 3 (three) times daily as needed (for itching ot redness). , Disp: , Rfl:   Clinical Interview:  The following information was obtained during a clinical interview with Mr. Pepper and his daughter prior to cognitive testing. However, Mr. Mcmannis was a limited historian and often got confused regarding question content, sometimes providing tangential responses which were unrelated to the original question asked. His daughter lives in [place] and is not around him all the time; however, she was able to provide more accurate information.   Cognitive Symptoms: Decreased  short-term memory: Endorsed. He reported primary difficulties with word finding. With more direct questioning, he reported trouble recalling the names of individuals, misplacing things around his home, and trouble recalling the details of previous conversations. His daughter also reported bouts of confusion and him being more repetitious in conversation. Deficits were said to have gradually worsened over the past 1-2 years. His daughter noted that they have seemed especially worse since July.  Decreased long-term memory: Denied. Decreased attention/concentration: Endorsed. He reported "probably so" when asked about trouble with sustained attention and distractibility. However, he clarified that he has not had ample opportunity to test these abilities lately.  Reduced processing speed: Unclear. Mr. Escoe did not understand the construct being asked about despite it being rephrased several times. His daughter reported that he does seem more foggy at times.  Difficulties with executive functions: Endorsed. While deficits with organization were said to be longstanding, his daughter reported that they have worsened over time. When asked about indecision, Mr. Fier was tangential, ultimately stating that he gets frustrated at times while making decisions. Trouble with impulsivity was denied. His daughter noted that he has seemed more easily irritated and has a shorter temper lately. No other personality changes were reported.  Difficulties with emotion regulation: Denied. Difficulties with receptive language: Denied assuming he can hear the source of the sound adequately.  Difficulties with word finding: Endorsed. Decreased visuoperceptual ability: Denied.  Difficulties completing ADLs: Endorsed. Mr. Perusse currently lives alone. His daughter noted that they have been having issues with medication adherence/compliance lately despite having a prior system in place. She stated that they have an appointment scheduled with  a pharmacist later today (05/29/2020) to discuss receiving his medications in pre-packaged pill packs to alleviate this concern. Concern with medication adherence requiring his daughter's assistance is also reflected in a telephone call between Mr. Barmore's daughter and Jesus Jordan on 05/18/2020. His daughter further noted that she writes his checks and has worked to get as many bills as possible on auto-draft for him. He reported continuing to drive without significant issue.   Additional Medical History: History of traumatic brain injury/concussion: Denied. History of stroke: Denied. History of seizure activity: Denied. History of known exposure to toxins: Denied. Symptoms of chronic pain: Denied. Experience of frequent headaches/migraines: Denied. Frequent instances of dizziness/vertigo: Endorsed. He reported instances of dizziness, largely attributed to medication side effects. Symptoms were said to occur in the evenings following him taking several specific medications. In addition to this, he reported some instances where he will briefly experience feeling lightheaded after standing up quickly.   Sensory changes: He wears glasses with positive effect. He reported ongoing hearing loss. His daughter stated that he previously had hearing aids but lost them and has not wanted to pay for new ones. She stated that she has been working to convince him of getting new hearing aids. Other sensory changes/difficulties (e.g., taste or smell) were denied.  Balance/coordination difficulties: Endorsed. However, difficulties were attributed to evening symptoms of dizziness due to medication side effects. He denied recent falls or trouble ambulating.  Other  motor difficulties: Denied.  Sleep History: Estimated hours obtained each night: 5-6 hours.  Difficulties falling asleep: Endorsed. He was somewhat tangential but appeared to describe the presence of intrusive and anxious thoughts which can prevent him from  falling asleep quickly.  Difficulties staying asleep: Denied. Feels rested and refreshed upon awakening: Variably so depending on the quantity and quality of sleep obtained the night before.   History of snoring: Endorsed. However, these symptoms have dissipated as he has lost weight over the years.  History of waking up gasping for air: Denied. Witnessed breath cessation while asleep: Denied.  History of vivid dreaming: Denied. Excessive movement while asleep: Endorsed. Instances of acting out his dreams: Endorsed. His daughter reported a recent occurrence where Mr. Handy was vacuuming or performing other actions while asleep. As she does not live with him, she was unclear of the frequency of these actions. Mr. Lamison was unaware of this occurrence.   Psychiatric/Behavioral Health History: Depression: He described his current mood as "not the best in the world" and acknowledged ongoing symptoms of depression. He denied a history of mental health concerns, suggesting that these symptoms are somewhat novel and potentially related to acutely ongoing stressors. His daughter noted that he has been prescribed mood-related medications in the past but that Mr. Narang is resistant and has not agreed to take them. Current or remote suicidal ideation, intent, or plan was denied.  Anxiety: Endorsed. He was difficult to decipher when answering, but appeared to express anxiety surrounding his realization that things in his daily life are not being accomplished with the same level of efficiency or completeness as they previously were. He noted that this can sometimes cause symptoms of panic.  Mania: Denied. Trauma History: Denied. Visual/auditory hallucinations: Unclear. When asked, Mr. Dentremont did not endorse ongoing hallucinations and provided an answer that I was unable to decipher. His daughter stated recently observing him speaking with and wishing Angela Nevin Christmas to an individual who was not present. This was the only  time this was witnessed.  Delusional thoughts: Denied.  Tobacco: Endorsed. His daughter reported a history of heavy tobacco use. Currently, he reported cutting down to where a pack of cigarettes will last him approximately five weeks.  Alcohol: He reported consuming on average a 6-pack of beer per day. It was unclear how long this pattern of alcohol consumption has been ongoing. He denied a history of alcohol dependence or needing rehabilitation treatment. He also denied having a recollection of times where alcohol use created psychosocial problems in his life.  Recreational drugs: Denied. Caffeine: He reported consuming 1-2 cups of coffee in the morning.   Family History: Problem Relation Age of Onset  . Colon cancer Father   . Alcoholism Father   . Hyperlipidemia Brother   . Heart disease Brother 54       massive MI  . Raynaud syndrome Brother   . Cancer Paternal Grandfather        colon  . Dementia Sister        Unspecified type   This information was confirmed by Mr. Arkwright.  Academic/Vocational History: Highest level of educational attainment: 12 years. He reported graduating from high school. When asked about academic performance, he was unable to describe the grades he generally received. Through context clues, he seemed to indicate that he was an average student. 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 He also reported attempting business school but did not complete a year. No subject-based relative weaknesses were identified.  History of developmental delay: Denied. History of grade repetition: Endorsed. As stated above, he repeated the 3rd grade.  Enrollment in special education courses: Denied. History of LD/ADHD: Denied.  Employment: Retired. Jesus Jordan was drafted and spent two years active in the [place] during the [place] War. He denied seeing combat. 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   Evaluation Results:   Behavioral Observations: Mr. Matters was accompanied by his daughter, arrived to his appointment on time, and was appropriately dressed and groomed. He appeared alert and oriented. Observed gait and station were within normal limits. Gross motor functioning appeared intact upon informal observation and no abnormal movements (e.g., tremors) were noted. His affect was generally relaxed and positive, but did range appropriately given the subject being discussed during the clinical interview or the task at hand during testing procedures. Spontaneous speech was fluent and word finding difficulties were not observed during the clinical interview. Thought processes were quite tangential and there were several instances where he either provided responses which were unrelated to the original question asked or his response was not decipherable and his daughter clarified. Insight into his cognitive difficulties appeared adequate. Although, he may not grasp the significance of ongoing weakness. During testing, he remained tangential and was easily distracted. An amplification device (i.e., pocket talker) was utilized to alleviate hearing concerns. Despite this, he had significant difficulties understanding task instructions and often required numerous clarifications. He also was unable to comprehend self-report questionnaires and was unsure how to distinguish between "sometimes," "most of the time," and "all of the time" Likert style responses. Questionnaires had to be read to Mr. Dreisbach aloud and simplified in order to get a sufficient response. Sustained attention was generally appropriate. Task engagement was adequate and he persisted when challenged. Overall, Mr. Hickel was cooperative with the clinical interview and subsequent testing procedures.    Adequacy of Effort: The validity of neuropsychological testing is limited by the extent to which the individual being tested may be assumed to have exerted adequate effort during testing. Mr. Hennon expressed his intention to perform to the best of his abilities and exhibited adequate task engagement and persistence. Scores across stand-alone and embedded performance validity measures were within expectation. As such, the results of the current evaluation are believed to be a valid representation of Mr. Boy current cognitive functioning.  Test Results: Mr. Greenfeld was poorly oriented at the time of the current evaluation. He incorrectly stated his phone number and was unable to state the current year, date, day of the week, or current location.   Intellectual abilities based upon educational and vocational attainment were estimated to be in the average range. Premorbid abilities were estimated to be within the below average range based upon a single-word reading test.   Processing speed was exceptionally low to well below average. Basic attention was above average to well above average. More complex attention (e.g., working memory) was well below average. Executive functioning was exceptionally low to well below average across all tasks outside of a task assessing verbal abstract reasoning (Similarities). He also performed in the well below average range across a task assessing safety and judgment, largely for generally vague responses.  Assessed receptive language abilities were well below average. He appeared to exhibit greatest difficulty understanding items which required him to  take numerous things into account at once (e.g., point to the shape which is to the right of X). Likewise, Mr. Duddy exhibited significant difficulties comprehending task instructions and answering questions asked of him appropriately. Assessed expressive language was mildly variable. Phonemic fluency was well below average,  semantic fluency was well below average to average, and confrontation naming was average on a screening instrument but well below average across a more comprehensive instrument.     Outside of a task assessing visuo-motor block manipulation Garment/textile technologist), assessed visuospatial/visuoconstructional abilities were exceptionally low to well below average. Points were lost on his drawing of a clock due to all the numbers being placed on the outside of the clock, very poor spatial arrangement (i.e., all numbers on the right side with the number 10 being placed where 6 would normally be), the omission of number 11, and poor/incorrect hand representation. Points were lost on his drawing of a complex figure largely due to the complete omission of two internal aspects. Mild distortions were also present.    Learning (i.e., encoding) of novel verbal information was exceptionally low. Spontaneous delayed recall (i.e., retrieval) of previously learned information was also exceptionally low. Retention rates were 33% (raw score of 1) across a story learning task, 0% across a list learning task, and 14% across a complex figure drawing task. Performance across recognition tasks was exceptionally low to well below average, suggesting minimal evidence for information consolidation.   Results of emotional screening instruments suggested that recent symptoms of generalized anxiety were in the moderate range, while symptoms of depression were within the mild range. A screening instrument assessing recent sleep quality suggested the presence of moderate sleep dysfunction.  Tables of Scores:   Note: This summary of test scores accompanies the interpretive report and should not be considered in isolation without reference to the appropriate sections in the text. Descriptors are based on appropriate normative data and may be adjusted based on clinical judgment. The terms "impaired" and "within normal limits (WNL)" are used when a  more specific level of functioning cannot be determined.       Effort Testing:   DESCRIPTOR       Dot Counting Test: --- --- Within Expectation  RBANS Effort Index: --- --- Within Expectation  WAIS-IV Reliable Digit Span: --- --- Within Expectation       Orientation:      Raw Score Percentile   NAB Orientation, Form 1 21/29 --- ---       Cognitive Screening:           Raw Score Percentile   SLUMS: 16/30 --- ---       RBANS, Form A: Standard Score/ Scaled Score Percentile   Total Score 60 <1 Exceptionally Low  Immediate Memory 44 <1 Exceptionally Low    List Learning 2 <1 Exceptionally Low    Story Memory 1 <1 Exceptionally Low  Visuospatial/Constructional 64 1 Exceptionally Low    Figure Copy 4 2 Well Below Average    Line Orientation 9/20 <2 Exceptionally Low  Language 96 39 Average    Picture Naming 9/10 26-50 Average    Semantic Fluency 8 25 Average  Attention 91 27 Average    Digit Span 15 95 Well Above Average    Coding 2 <1 Exceptionally Low  Delayed Memory 48 <1 Exceptionally Low    List Recall 0/10 <2 Exceptionally Low    List Recognition 14/20 <2 Exceptionally Low    Story Recall 2 <1 Exceptionally Low  Story Recognition 5/12 1-2 Exceptionally Low    Figure Recall 3 1 Exceptionally Low    Figure Recognition 2/8 2-3 Well Below Average       Intellectual Functioning:           Standard Score Percentile   Test of Premorbid Functioning: 81 10 Below Average       Attention/Executive Function:          Trail Making Test (TMT): Raw Score (T Score) Percentile     Part A 100 secs.,  0 errors (19) <1 Exceptionally Low    Part B DC'D @ 300,  1 error --- Impaired         Scaled Score Percentile   WAIS-IV Digit Span: 7 16 Below Average    Forward 13 84 Above Average    Backward 5 5 Well Below Average    Sequencing 4 2 Well Below Average        Scaled Score Percentile   WAIS-IV Similarities: 6 9 Below Average       D-KEFS Color-Word Interference Test: Raw  Score (Scaled Score) Percentile     Color Naming 62 secs. (1) <1 Exceptionally Low    Word Reading 37 secs. (4) 2 Well Below Average    Inhibition 114 secs. (4) 2 Well Below Average      Total Errors 8 errors (5) 5 Well Below Average    Inhibition/Switching Discontinued --- Impaired      Total Errors --- --- ---       D-KEFS Verbal Fluency Test: Raw Score (Scaled Score) Percentile     Letter Total Correct 16 (4) 2 Well Below Average    Category Total Correct 17 (4) 2 Well Below Average    Category Switching Total Correct 0 (1) <1 Exceptionally Low    Category Switching Accuracy 0 (1) <1 Exceptionally Low      Total Set Loss Errors 2 (10) 50 Average      Total Repetition Errors 5 (8) 25 Average       NAB Executive Functions Module, Form 1: T Score Percentile     Judgment 30 2 Well Below Average       Language:          Verbal Fluency Test: Raw Score (T Score) Percentile     Phonemic Fluency (FAS) 16 (31) 3 Well Below Average    Animal Fluency 11 (34) 5 Well Below Average        NAB Language Module, Form 1: T Score Percentile     Auditory Comprehension 31 3 Well Below Average    Naming 26/31 (34) 5 Well Below Average       Visuospatial/Visuoconstruction:      Raw Score Percentile   Clock Drawing: 3/10 --- Impaired       NAB Spatial Module, Form 1: T Score Percentile     Visual Discrimination 26 1 Exceptionally Low        Scaled Score Percentile   WAIS-IV Block Design: 11 63 Average       Mood and Personality:      Raw Score Percentile   Geriatric Depression Scale: 19 --- Mild  Geriatric Anxiety Scale: 23 --- Moderate    Somatic 8 --- Mild    Cognitive 10 --- Severe    Affective 5 --- Mild       Additional Questionnaires:      Raw Score Percentile   PROMIS Sleep Disturbance Questionnaire: 30 --- Moderate   Informed Consent and  Coding/Compliance:   Mr. Decesare was provided with a verbal description of the nature and purpose of the present neuropsychological evaluation.  Also reviewed were the foreseeable risks and/or discomforts and benefits of the procedure, limits of confidentiality, and mandatory reporting requirements of this provider. The patient was given the opportunity to ask questions and receive answers about the evaluation. Oral consent to participate was provided by the patient.   This evaluation was conducted by Christia Reading, Ph.D., licensed clinical neuropsychologist. Mr. Enderle completed a 40 minute comprehensive clinical interview with Dr. Melvyn Novas, billed as one unit [phone], and 155 minutes of cognitive testing and scoring, billed as one unit [phone] and four additional units 96139. Psychometrist Milana Kidney, B.S., assisted Dr. Melvyn Novas with test administration and scoring procedures. As a separate and discrete service, Dr. Melvyn Novas spent a total of 160 minutes in interpretation and report writing billed as one unit [phone] and two units 96133.

## 2020-05-29 NOTE — Progress Notes (Signed)
   Psychometrician Note   Cognitive testing was administered to Jesus Jordan by Milana Kidney, B.S. (psychometrist) under the supervision of Dr. Christia Reading, Ph.D., licensed psychologist on 05/29/20. Jesus Jordan did not appear overtly distressed by the testing session per behavioral observation or responses across self-report questionnaires. Dr. Christia Reading, Ph.D. checked in with Jesus Jordan as needed to manage any distress related to testing procedures (if applicable). Rest breaks were offered.    The battery of tests administered was selected by Dr. Christia Reading, Ph.D. with consideration to Jesus Jordan current level of functioning, the nature of his symptoms, emotional and behavioral responses during interview, level of literacy, observed level of motivation/effort, and the nature of the referral question. This battery was communicated to the psychometrist. Communication between Dr. Christia Reading, Ph.D. and the psychometrist was ongoing throughout the evaluation and Dr. Christia Reading, Ph.D. was immediately accessible at all times. Dr. Christia Reading, Ph.D. provided supervision to the psychometrist on the date of this service to the extent necessary to assure the quality of all services provided.    Jesus Jordan will return within approximately 1-2 weeks for an interactive feedback session with Dr. Melvyn Novas at which time his test performances, clinical impressions, and treatment recommendations will be reviewed in detail. Jesus Jordan understands he can contact our office should he require our assistance before this time.  A total of 155 minutes of billable time were spent face-to-face with Jesus Jordan by the psychometrist. This includes both test administration and scoring time. Billing for these services is reflected in the clinical report generated by Dr. Christia Reading, Ph.D..  This note reflects time spent with the psychometrician and does not include test scores or any clinical interpretations made by Dr.  Melvyn Novas. The full report will follow in a separate note.

## 2020-05-30 NOTE — Progress Notes (Signed)
Pt has appt for follow up on Dec 17th.  Will address MRI brain at that time.

## 2020-06-02 NOTE — Chronic Care Management (AMB) (Signed)
°  Chronic Care Management    Outreach Note    Name: Jesus Jordan   MRN: [id number]       DOB:10/16/1945   Referred by: Eulas Post, MD  Reason for referral: Chronic care management  Reviewed chart for medication changes ahead of medication coordination call.  BP Readings from Last 3 Encounters:  03/06/20 110/60  02/10/20 110/62  09/10/19 110/66    Lab Results  Component Value Date   HGBA1C  09/13/2008    5.8 (NOTE)   The ADA recommends the following therapeutic goal for glycemic   control related to Hgb A1C measurement:   Goal of Therapy:   < 7.0% Hgb A1C   Reference: American Diabetes Association: Clinical Practice   Recommendations 2008, Diabetes Care,  2008, 31:(Suppl 1).     Verbal consent obtained for [facility] enhanced pharmacy services (medication synchronization, adherence packaging, delivery coordination). A medication sync plan was created to allow patient to get all medications delivered once every 30 to 90 days per patient preference. Patient understands they have freedom to choose pharmacy and clinical pharmacist will coordinate care between all prescribers and [facility].  Patient requested to obtain medications through Adherence Packaging  30 Days   Med Sync Plan: Medication Name Last Fill Date & Day Supply Format: MM/DD/YY - DS (If last fill/DS unavailable, list pt.s quantity on hand) Anticipated next due date  Format: MM/DD/YY      Aspirin 81 mg tablet - 1 tablet daily  OTC Add when packaging starts  Allopurinol 300 mg tablet - 1 tablet daily #44 tablets 07-12-20  Donepezil 10 mg tablet - 1 tablet  #34 07-02-20  Ezetimibe 10 mg tablet  - 1 tablet daily At least 90 tablets Fill when packaging starts   Metoprolol succinate 25 mg tablet - 1 tablet daily #30 tablets  06-28-20  Pantoprazole 40 mg tablet - 1 tablet daily PRN PRN Will call if needed  Simvastatin 40 mg - 1 tablet daily #33 07-01-20  Mirtazapine 15 mg tablet - 1 tablet daily  #24 06-22-20  Fluticasone 0.05% cream  PRN/OTC Will call if needed  Desoximetasone 0.25% cream PRN Will call if needed    Prescriptions requested from PCP and specialists.   [name], PharmD, [place] [facility]ist [facility] [place]

## 2020-06-02 NOTE — Chronic Care Management (AMB) (Signed)
Chronic Care Management Pharmacy  Name: Jesus Jordan  MRN: [id number] DOB: 08/16/1945  Initial Questions: 1. Have you seen any other providers since your last visit? No  2. Any changes in your medicines or health? No   Chief Complaint/ HPI  Jesus Jordan,  74 y.o. , male presents for their Follow-Up CCM visit with the clinical pharmacist In office.  PCP : Eulas Post, MD  Their chronic conditions include: HTN, HLD, CAD, Gout, depression, memory loss, GERD  Office Visits: -03/06/20 [name], MD: Patient presented for memory loss follow up. Patient scored 24/30 on MMSE and started Aricept 5 mg daily at last visit. Increased to 10 mg daily. Referral for neuropsychologist to confirm diagnosis. Patient is eating fairly regularly but has noted some weight loss. Patient is drinking Ensure. Patient has tried Remeron in the past without success. Received influenza vaccine in office. Follow up in 3 months.  -02/10/20 [name], MD: Patient presented for 6 month follow up and concerns about memory loss. Lab work stable and TSH and vit B12 were WNL. Patient has significant family history and has noted changes over the last year. Prescribed Aricept 5 mg daily. Follow up in 1 month.  Consult Visit: -09/24/19 Danella Sensing (dermatology): Patient presented for follow up. Unable to access notes.  -09/10/19 Shelva Majestic, MD (cardiology): Patient presented for follow up for CAD and HLD and re-evaluation of  Intermittent left-side chest pain . BP is stable without orthostatic change. Continued current medications. Follow up in 6 months.  Medications: Outpatient Encounter Medications as of 05/29/2020  Medication Sig  . allopurinol (ZYLOPRIM) 300 MG tablet Take 1 tablet by mouth once daily  . aspirin 81 MG tablet Take 81 mg by mouth at bedtime.   . calcium carbonate (TUMS - DOSED IN MG ELEMENTAL CALCIUM) 500 MG chewable tablet Chew 1-2 tablets by mouth as needed for indigestion or heartburn.  .  clopidogrel (PLAVIX) 75 MG tablet Take 1 tablet by mouth once daily (Patient not taking: Reported on 04/21/2020)  . desoximetasone (TOPICORT) 0.25 % cream APPLY TWICE A DAY AS DIRECTED  . donepezil (ARICEPT) 10 MG tablet Take 1 tablet (10 mg total) by mouth at bedtime.  Marland Kitchen ezetimibe (ZETIA) 10 MG tablet Take 1 tablet by mouth once daily (Patient taking differently: Take 10 mg by mouth at bedtime. )  . fluticasone (CUTIVATE) 0.05 % cream Apply 1 application topically daily as needed (to affected areas of face).   . metoprolol succinate (TOPROL-XL) 25 MG 24 hr tablet Take 1 tablet (25 mg total) by mouth daily.  . Milk Thistle 1000 MG CAPS Take 1 capsule by mouth daily.  . mirtazapine (REMERON) 15 MG tablet Take 1 tablet (15 mg total) by mouth at bedtime. (Patient not taking: Reported on 03/06/2020)  . mirtazapine (REMERON) 15 MG tablet Take 1 tablet (15 mg total) by mouth at bedtime.  . nitroGLYCERIN (NITROSTAT) 0.4 MG SL tablet DISSOLVE ONE TABLET UNDER THE TONGUE EVERY 5 MINUTES AS NEEDED FOR CHEST PAIN.  DO NOT EXCEED A TOTAL OF 3 DOSES IN 15 MINUTES...CALL 911  . pantoprazole (PROTONIX) 40 MG tablet Take 1 tablet (40 mg total) by mouth daily as needed.  . simvastatin (ZOCOR) 40 MG tablet TAKE 1 TABLET BY MOUTH AT BEDTIME  . tetrahydrozoline-zinc (VISINE-AC) 0.05-0.25 % ophthalmic solution Place 2 drops into both eyes 3 (three) times daily as needed (for itching ot redness).    No facility-administered encounter medications on file as of 05/29/2020.  Current Diagnosis/Assessment:  Goals Addressed            This Visit's Progress   . Pharmacy Care Plan       CARE PLAN ENTRY (see longitudinal plan of care for additional care plan information)  Current Barriers:  . Chronic Disease Management support, education, and care coordination needs related to Hypertension, Hyperlipidemia, Coronary Artery Disease, GERD, Depression, Gout, and memory loss   Hypertension BP Readings from Last 3  Encounters:  03/06/20 110/60  02/10/20 110/62  09/10/19 110/66   . Pharmacist Clinical Goal(s): o Over the next 30 days, patient will work with PharmD and providers to maintain BP goal <130/80 . Current regimen:  o Metoprolol succinate 25 mg 1 tablet daily . Interventions: o Discussed the importance of checking blood pressure at home regularly o Discussed DASH eating plan recommendations: . Emphasizes vegetables, fruits, and whole-grains . Includes fat-free or low-fat dairy products, fish, poultry, beans, nuts, and vegetable oils . Limits foods that are high in saturated fat. These foods include fatty meats, full-fat dairy products, and tropical oils such as coconut, palm kernel, and palm oils. . Limits sugar-sweetened beverages and sweets . Limiting sodium intake to < 1500 mg/day . Patient self care activities - Over the next 30 days, patient will: o Check BP daily, document, and provide at future appointments o Ensure daily salt intake < 2300 mg/day  Hyperlipidemia Lab Results  Component Value Date/Time   LDLCALC 49 02/10/2020 11:48 AM   LDLCALC 54 03/06/2013 10:39 AM   . Pharmacist Clinical Goal(s): o Over the next 90 days, patient will work with PharmD and providers to maintain LDL goal < 70 . Current regimen:  . Simvastatin 40 mg 1 tablet daily . Ezetimibe 10 mg 1 tablet daily . Interventions: o Discussed lowering cholesterol through diet by: Marland Kitchen Limiting foods with cholesterol such as liver and other organ meats, egg yolks, shrimp, and whole milk dairy products . Avoiding saturated fats and trans fats and incorporating healthier fats, such as lean meat, nuts, and unsaturated oils like canola and olive oils . Eating foods with soluble fiber such as whole-grain cereals such as oatmeal and oat bran, fruits such as apples, bananas, oranges, pears, and prunes, legumes such as kidney beans, lentils, chick peas, black-eyed peas, and lima beans, and green leafy vegetables . Limiting  alcohol intake . Patient self care activities - Over the next 90 days, patient will: o Continue current medications  CAD/History of heart attack . Pharmacist Clinical Goal(s): o Over the next 90 days, patient will work with PharmD and providers to prevent heart events . Current regimen:  . Aspirin 81 mg 1 tablet daily  . Nitroglycerin 0.4 mg SL tablet PRN . Interventions: o Discussed the need to be on blood thinners to prevent future heart attacks and strokes o Discussed monitoring for signs of bleeding such as unexplained and excessive bleeding from a cut or injury, easy or excessive bruising, blood in urine or stools, and nosebleeds without a known cause . Patient self care activities - Over the next 90 days, patient will: o Continue current medications  Gout . Pharmacist Clinical Goal(s) o Over the next 90 days, patient will work with PharmD and providers to prevent gout flare ups . Current regimen:  o Allopurinol 300 mg 1 tablet daily . Interventions: o Discussed avoiding/limiting foods with high purine content: . wild game, such as veal, venison, and duck . red meat . some seafood, including tuna, sardines, anchovies, herring, mussels, codfish,  scallops, trout, and haddock . organ meat, such as liver, kidneys, and thymus glands, which are known as sweetbreads o Avoiding/limiting foods to allow the body to process purines more effectively: . High-fat foods: Fat holds uric acid in the kidneys, so a person should avoid fried foods, full-fat dairy products, rich desserts, and other high-fat items. . Alcohol: Beer and whiskey are high in purines, but some research shows that all alcohol consumption can raise uric acid levels. Alcohol also causes dehydration, which hampers the body's ability to flush out uric acid. . Sweetened beverages: Fructose is an ingredient in many sweetened beverages, including fruit juices and sodas, and consuming too much puts a person at risk for  gout. . Patient self care activities - Over the next 90 days, patient will: o Continue current medications  Depression . Pharmacist Clinical Goal(s) o Over the next 30 days, patient will work with PharmD and providers to manage symptoms of depression . Current regimen:  o No medications . Interventions: o Discuss with Dr. [name] about possible dreams from mirtazapine . Patient self care activities - Over the next 30 days, patient will: o Continue without medications  Memory loss . Pharmacist Clinical Goal(s) o Over the next 90 days, patient will work with PharmD and providers to preserve memory  . Current regimen:  o Donepezil 10 mg 1 tablet at bedtime . Interventions: o Discussed side effects of donepezil and safety profile . Patient self care activities - Over the next 90 days, patient will: o Continue current medications  GERD . Pharmacist Clinical Goal(s) o Over the next 90 days, patient will work with PharmD and providers to manage symptoms of heart burn . Current regimen:  . Tums 500 mg 1-2 tablets as needed . Pantoprazole 40 mg 1 tablet daily as needed . Interventions: o Discussed non-pharmacologic management of symptoms such as elevating the head of your bed, avoiding eating 2-3 hours before bed, avoiding triggering foods such as acidic, spicy, or fatty foods, eating smaller meals, and wearing clothes that are loose around the waist . Patient self care activities - Over the next 90 days, patient will: o Continue current medications  Medication management . Pharmacist Clinical Goal(s): o Over the next 90 days, patient will work with PharmD and providers to achieve optimal medication adherence . Current pharmacy: [facility] . Interventions o Comprehensive medication review performed. o Utilize [facility] pharmacy for medication synchronization, packaging and delivery . Patient self care activities - Over the next 90 days, patient will: o Focus on medication adherence by  switching to [facility] pharmacy for adherence packaging o Take medications as prescribed o Report any questions or concerns to PharmD and/or provider(s)  Please see past updates related to this goal by clicking on the "Past Updates" button in the selected goal         Hypertension   BP goal is:  <130/80  Office blood pressures are  BP Readings from Last 3 Encounters:  03/06/20 110/60  02/10/20 110/62  09/10/19 110/66   Patient checks BP at home never Patient home BP readings are ranging: 105/60  Patient has failed these meds in the past: none Patient is currently controlled on the following medications:  . Carvedilol 12.5 mg 1 tablet once daily  We discussed diet and exercise extensively and the importance of checking blood pressure at home  -Has not picked up metoprolol once daily yet - plan to start tomorrow with morning medications   Plan Continue current medications.   Hyperlipidemia  LDL goal < 70  Last lipids Lab Results  Component Value Date   CHOL 140 02/10/2020   HDL 76 02/10/2020   LDLCALC 49 02/10/2020   TRIG 66 02/10/2020   CHOLHDL 1.8 02/10/2020   Hepatic Function Latest Ref Rng & Units 02/10/2020 12/09/2018 07/10/2017  Total Protein 6.1 - 8.1 g/dL 6.9 6.8 6.9  Albumin 3.5 - 5.0 g/dL - 4.0 4.4  AST 10 - 35 U/L _0 ALT 9 - 46 U/L _1 Alk Phosphatase 38 - 126 U/L - 66 65  Total Bilirubin 0.2 - 1.2 mg/dL 0.8 1.1 0.8  Bilirubin, Direct 0.0 - 0.2 mg/dL 0.2 - 0.1     The ASCVD Risk score ([place]., et al., 2013) failed to calculate for the following reasons:   The patient has a prior MI or stroke diagnosis   Patient has failed these meds in past: none Patient is currently controlled on the following medications:  . Simvastatin 40 mg 1 tablet daily . Ezetimibe 10 mg 1 tablet daily  We discussed:  diet and exercise extensively  Plan  Continue current medications  CAD/hx of MI   Patient has failed these meds in past:  none Patient is currently controlled on the following medications:  . Aspirin 81 mg 1 tablet daily in PM . Clopidogrel 75 mg 1 tablet daily - patient is no longer taking . Ezetimibe 10 mg 1 tablet daily . Simvastatin 40 mg 1 tablet daily . Nitroglycerin 0.4 mg SL tablet PRN  We discussed:  Monitoring for signs of bleeding such as unexplained and excessive bleeding from a cut or injury, easy or excessive bruising, blood in urine or stools, and nosebleeds without a known cause  Plan Continue current medications   Gout   Patient denies any gout flare-ups recently.   Lab Results  Component Value Date   LABURIC 5.8 01/29/2009   LABURIC 7.0 09/16/2008   Patient is currently controlled on:  . Allopurinol 300 mg 1 tablet daily  Plan  Continue current medications  Depression   Depression screen [facility] 2/9 04/29/2019 04/18/2018 03/21/2016  Decreased Interest 1 1 0  Down, Depressed, Hopeless 0 1 0  PHQ - 2 Score 1 2 0  Altered sleeping - 1 -  Tired, decreased energy - 0 -  Change in appetite - 1 -  Feeling bad or failure about yourself  - 0 -  Trouble concentrating - 1 -  Moving slowly or fidgety/restless - 0 -  Suicidal thoughts - 0 -  PHQ-9 Score - 5 -  Difficult doing work/chores - Somewhat difficult -    Patient has failed these meds in past: unknown Patient is currently uncontrolled on the following medications:  Marland Kitchen Mirtazapine 15 mg 1 tablet at bedtime   We discussed:  Patient believes this medication could be causing vivid dreams but has not been taking consistently - recommended for him to discuss with PCP in one week  Plan Continue current medications. Plan to discuss about causing vivid dreams  Memory loss   Patient has failed these meds in past: none Patient is currently controlled on the following medications:  . Donepezil 10 mg 1 tablet at bedtime  Plan  Continue current medications   GERD   Patient has failed these meds in past: none Patient is  currently controlled on the following medications:  . Tums 500 mg 1-2 tablets as needed . Pantoprazole 40 mg 1 tablet daily as needed  We discussed:  Non-pharmacologic management of symptoms such as elevating the head of your bed, avoiding eating 2-3 hours before bed, avoiding triggering foods such as acidic, spicy, or fatty foods, eating smaller meals, and wearing clothes that are loose around the waist   Plan  Continue current medications    Miscellaneous   Patient is currently on the following medications:  . Desoximetasone 0.25% cream apply twice daily as directed . Fluticasone 0.05% cream apply daily as needed . Visine AC 0.05%-0.25% apply 2 drops in both eyes three times daily  . Milk thistle 1000 mg 1 capsule daily  Plan  Continue current medications  Vaccines   Reviewed and discussed patient's vaccination history.    Immunization History  Administered Date(s) Administered  . Fluad Quad(high Dose 65+) 02/08/2019, 03/06/2020  . Influenza, High Dose Seasonal PF 04/30/2013, 05/12/2014, 05/12/2015, 03/21/2016, 04/20/2017  . Influenza-Unspecified 03/13/2018, 02/19/2019  . PFIZER SARS-COV-2 Vaccination 08/01/2019, 08/26/2019  . Pneumococcal Conjugate-13 01/14/2015, 03/13/2018  . Pneumococcal Polysaccharide-23 01/07/2010, 03/21/2016  . Pneumococcal-Unspecified 05/24/2019  . Td 06/20/1993, 01/07/2010  . Zoster 02/14/2012   Updated immunization history to reflect COVID immunization dates per [facility].  Plan  Recommended patient receive Shingles and tetanus vaccine in office/at pharmacy.   Medication Management   Pt uses [facility] pharmacy for all medications Uses pill box? Yes - daughter fills the boxes Pt is very unsure of current compliance - pill boxes were completely off   We discussed: Discussed benefits of medication synchronization, packaging and delivery as well as enhanced pharmacist oversight with [facility].   Plan  Utilize [facility] pharmacy for  medication synchronization, packaging and delivery  Follow up: 3 month phone visit 1 month BP assessment with CPA.  [name], PharmD Clinical Pharmacist [place] at [place]

## 2020-06-02 NOTE — Patient Instructions (Signed)
Hi [name],  It was great to get to see you again in the office and meet [name]! As we had discussed, please continue checking your blood pressure and plan to discuss the possibility that one of your medicines is causing vivid dreams with Dr. [name].  I will plan to have my assistant Mimi reach out to you again in about a month to check in on those blood pressures.  Please give me a call or send me a message if you have any questions or need anything before our follow up.  Best, Maddie  [name], PharmD [facility] Clinical Pharmacist [place] [facility] [place]   Visit Information  Goals Addressed            This Visit's Progress   . Pharmacy Care Plan       CARE PLAN ENTRY (see longitudinal plan of care for additional care plan information)  Current Barriers:  . Chronic Disease Management support, education, and care coordination needs related to Hypertension, Hyperlipidemia, Coronary Artery Disease, GERD, Depression, Gout, and memory loss   Hypertension BP Readings from Last 3 Encounters:  03/06/20 110/60  02/10/20 110/62  09/10/19 110/66   . Pharmacist Clinical Goal(s): o Over the next 30 days, patient will work with PharmD and providers to maintain BP goal <130/80 . Current regimen:  o Metoprolol succinate 25 mg 1 tablet daily . Interventions: o Discussed the importance of checking blood pressure at home regularly o Discussed DASH eating plan recommendations: . Emphasizes vegetables, fruits, and whole-grains . Includes fat-free or low-fat dairy products, fish, poultry, beans, nuts, and vegetable oils . Limits foods that are high in saturated fat. These foods include fatty meats, full-fat dairy products, and tropical oils such as coconut, palm kernel, and palm oils. . Limits sugar-sweetened beverages and sweets . Limiting sodium intake to < 1500 mg/day . Patient self care activities - Over the next 30 days, patient will: o Check BP daily,  document, and provide at future appointments o Ensure daily salt intake < 2300 mg/day  Hyperlipidemia Lab Results  Component Value Date/Time   LDLCALC 49 02/10/2020 11:48 AM   LDLCALC 54 03/06/2013 10:39 AM   . Pharmacist Clinical Goal(s): o Over the next 90 days, patient will work with PharmD and providers to maintain LDL goal < 70 . Current regimen:  . Simvastatin 40 mg 1 tablet daily . Ezetimibe 10 mg 1 tablet daily . Interventions: o Discussed lowering cholesterol through diet by: Marland Kitchen Limiting foods with cholesterol such as liver and other organ meats, egg yolks, shrimp, and whole milk dairy products . Avoiding saturated fats and trans fats and incorporating healthier fats, such as lean meat, nuts, and unsaturated oils like canola and olive oils . Eating foods with soluble fiber such as whole-grain cereals such as oatmeal and oat bran, fruits such as apples, bananas, oranges, pears, and prunes, legumes such as kidney beans, lentils, chick peas, black-eyed peas, and lima beans, and green leafy vegetables . Limiting alcohol intake . Patient self care activities - Over the next 90 days, patient will: o Continue current medications  CAD/History of heart attack . Pharmacist Clinical Goal(s): o Over the next 90 days, patient will work with PharmD and providers to prevent heart events . Current regimen:  . Aspirin 81 mg 1 tablet daily  . Nitroglycerin 0.4 mg SL tablet PRN . Interventions: o Discussed the need to be on blood thinners to prevent future heart attacks and strokes o Discussed monitoring for signs of  bleeding such as unexplained and excessive bleeding from a cut or injury, easy or excessive bruising, blood in urine or stools, and nosebleeds without a known cause . Patient self care activities - Over the next 90 days, patient will: o Continue current medications  Gout . Pharmacist Clinical Goal(s) o Over the next 90 days, patient will work with PharmD and providers to  prevent gout flare ups . Current regimen:  o Allopurinol 300 mg 1 tablet daily . Interventions: o Discussed avoiding/limiting foods with high purine content: . wild game, such as veal, venison, and duck . red meat . some seafood, including tuna, sardines, anchovies, herring, mussels, codfish, scallops, trout, and haddock . organ meat, such as liver, kidneys, and thymus glands, which are known as sweetbreads o Avoiding/limiting foods to allow the body to process purines more effectively: . High-fat foods: Fat holds uric acid in the kidneys, so a person should avoid fried foods, full-fat dairy products, rich desserts, and other high-fat items. . Alcohol: Beer and whiskey are high in purines, but some research shows that all alcohol consumption can raise uric acid levels. Alcohol also causes dehydration, which hampers the body's ability to flush out uric acid. . Sweetened beverages: Fructose is an ingredient in many sweetened beverages, including fruit juices and sodas, and consuming too much puts a person at risk for gout. . Patient self care activities - Over the next 90 days, patient will: o Continue current medications  Depression . Pharmacist Clinical Goal(s) o Over the next 30 days, patient will work with PharmD and providers to manage symptoms of depression . Current regimen:  o No medications . Interventions: o Discuss with Dr. [name] about possible dreams from mirtazapine . Patient self care activities - Over the next 30 days, patient will: o Continue without medications  Memory loss . Pharmacist Clinical Goal(s) o Over the next 90 days, patient will work with PharmD and providers to preserve memory  . Current regimen:  o Donepezil 10 mg 1 tablet at bedtime . Interventions: o Discussed side effects of donepezil and safety profile . Patient self care activities - Over the next 90 days, patient will: o Continue current medications  GERD . Pharmacist Clinical  Goal(s) o Over the next 90 days, patient will work with PharmD and providers to manage symptoms of heart burn . Current regimen:  . Tums 500 mg 1-2 tablets as needed . Pantoprazole 40 mg 1 tablet daily as needed . Interventions: o Discussed non-pharmacologic management of symptoms such as elevating the head of your bed, avoiding eating 2-3 hours before bed, avoiding triggering foods such as acidic, spicy, or fatty foods, eating smaller meals, and wearing clothes that are loose around the waist . Patient self care activities - Over the next 90 days, patient will: o Continue current medications  Medication management . Pharmacist Clinical Goal(s): o Over the next 90 days, patient will work with PharmD and providers to achieve optimal medication adherence . Current pharmacy: [facility] . Interventions o Comprehensive medication review performed. o Utilize [facility] pharmacy for medication synchronization, packaging and delivery . Patient self care activities - Over the next 90 days, patient will: o Focus on medication adherence by switching to [facility] pharmacy for adherence packaging o Take medications as prescribed o Report any questions or concerns to PharmD and/or provider(s)  Please see past updates related to this goal by clicking on the "Past Updates" button in the selected goal         The patient verbalized understanding  of instructions, educational materials, and care plan provided today and declined offer to receive copy of patient instructions, educational materials, and care plan.   Telephone follow up appointment with pharmacy team member scheduled for: 3 months  Viona Gilmore, [facility]

## 2020-06-05 ENCOUNTER — Other Ambulatory Visit: Payer: Self-pay

## 2020-06-05 ENCOUNTER — Telehealth: Payer: Self-pay | Admitting: *Deleted

## 2020-06-05 ENCOUNTER — Ambulatory Visit (INDEPENDENT_AMBULATORY_CARE_PROVIDER_SITE_OTHER): Payer: Medicare Other | Admitting: Psychology

## 2020-06-05 ENCOUNTER — Ambulatory Visit: Payer: Medicare Other | Admitting: Family Medicine

## 2020-06-05 DIAGNOSIS — F0280 Dementia in other diseases classified elsewhere without behavioral disturbance: Secondary | ICD-10-CM | POA: Diagnosis not present

## 2020-06-05 MED ORDER — ALLOPURINOL 300 MG PO TABS: 300.0000 mg | ORAL_TABLET | Freq: Every day | ORAL | 0 refills | Status: DC

## 2020-06-05 NOTE — Telephone Encounter (Signed)
Okay for him to be off Plavix

## 2020-06-05 NOTE — Telephone Encounter (Signed)
Rx done. 

## 2020-06-05 NOTE — Progress Notes (Signed)
   Neuropsychology Feedback Session Tillie Rung. [facility] Department of Neurology  Reason for Referral:   Jesus Jordan a 74 y.o. right-handed Caucasian male referred by [name], M.D.,to characterize hiscurrent cognitive functioning and assist with diagnostic clarity and treatment planning in the context of subjective cognitive decline and a history of several cardiovascular comorbidities.   Feedback:   Jesus Jordan completed a comprehensive neuropsychological evaluation on 05/29/2020. Please refer to that encounter for the full report and recommendations. Briefly, results suggested fairly diffuse cognitive impairment with noted areas of dysfunction surrounding processing speed, complex attention, executive functioning, and learning and memory. Visuospatial abilities were mildly variable but overall far below expectation. Variability was also noted across semantic fluency and confrontation naming, while basic attention was intact. The etiology of ongoing dysfunction is difficult to determine, largely due to the fairly diffuse nature of cognitive impairment. Considering base rates, Alzheimer's disease represents the most common form of neurodegenerative illness. Jesus Jordan cognitive profile does share many characteristics with the typical pattern of this condition, namely significant memory impairment with evidence for a memory storage deficit, as well as ongoing dysfunction surrounding executive functioning and visuospatial abilities. However, variability across semantic fluency and confrontation naming rather than consistent impairment is somewhat unexpected. Additionally, a Lewy body dementia should remain on his differential. His cognitive performance (i.e., frontal subcortical dysfunction with pronounced visuospatial deficits) is consistent with this condition, as are reports of potential visual hallucinations and REM sleep behaviors. However, Alzheimer's disease is viewed as  the more likely culprit at this time given the extent of memory loss.  Jesus Jordan was accompanied by his daughter via speakerphone during the current feedback session. Content of the current session focused on the results of his neuropsychological evaluation. Jesus Jordan and his daughter were given the opportunity to ask questions and their questions were answered. They were encouraged to reach out should additional questions arise. A copy of his report was provided at the conclusion of the visit.      20 minutes were spent conducting the current feedback session with Jesus Jordan, billed as one unit [phone].

## 2020-06-05 NOTE — Telephone Encounter (Signed)
-----   Message from Viona Gilmore, [facility] sent at 06/03/2020  8:12 AM EST ----- Regarding: Allopurinol refill Hi,  [name] is switching to [facility] and is running out of allopurinol soon. Can you please send a refill of allopurinol to [facility]? I updated his preferred pharmacy.  Thanks, Maddie

## 2020-06-05 NOTE — Patient Instructions (Signed)
I would recommend that Dr. [name] refer Jesus Jordan for a brain MRI to examine and anatomical correlates for ongoing cognitive dysfunction. It may also be beneficial for Jesus Jordan to be followed by a neurologist given ongoing cognitive dysfunction.   Jesus Jordan has already been prescribed medication commonly given to those with memory concerns (i.e., Aricept/donepezil). While I encourage Jesus Jordan to continue taking this medication as prescribed, it is important to highlight that current medications cannot stop or reverse cognitive decline in the face of a neurodegenerative illness. In some individuals, they may slow functional decline.   The CDC defines heavy drinking in males as consuming greater than 15 alcoholic beverages per week. During the current interview, Jesus Jordan reported consuming a 6-pack per day. This calculates to 42 alcoholic beverages per week, far above the CDC threshold. Chronic and heavy alcohol consumption will have a negative effect on all aspects of Jesus Jordan health, including cognitive functioning. It could also be interacting with his prescribed medications. He is strongly encouraged to decrease his regular alcohol intake and ideally eliminate it entirely.    Broadly, performance across neurocognitive testing is not a strong predictor of an individual's safety operating a motor vehicle. However, given the extent of cognitive deficits (especially surrounding processing speed, executive functioning, and visuospatial abilities), I would advise against Jesus Jordan operating a motorized vehicle at this time. Should his family wish to pursue a formalized driving evaluation, they would be encouraged to [employer] [facility] in [place], [place] at [phone].Another option would be through [facility]; however, the latter would likely require a referral from a medical doctor. [facility] can be reached directly at [phone].  Should there be a progression of his current  deficits over time, Jesus Jordan is unlikely to regain any independent living skills lost. Therefore, it is recommended that he remain as involved as possible in all aspects of household chores, finances, and medication management, with supervision to ensure adequate performance. He will likely benefit from the establishment and maintenance of a routine in order to maximize his functional abilities over time.  It will be important for Jesus Jordan to have another person with him when in situations where he may need to process information, weigh the pros and cons of different options, and make decisions, in order to ensure that he fully understands and recalls all information to be considered.  If not already done, Jesus Jordan and his family may want to discuss his wishes regarding durable power of attorney and medical decision making, so that he can have input into these choices. Additionally, they may wish to discuss future plans for caretaking and seek out community options for in home/residential care should they become necessary.  Information important to remember should be provided in written format in all instances. This should be placed in a highly visible and commonly frequented area of his residence to help promote recall.   To address problems with processing speed, he may wish to consider:   -Ensuring that he is alerted when essential material or instructions are being presented   -Adjusting the speed at which new information is presented   -Allowing for more time in comprehending, processing, and responding in conversation  To address problems with fluctuating attention and executive dysfunction, he may wish to consider:   -Avoiding external distractions when needing to concentrate   -Limiting exposure to fast paced environments with multiple sensory demands   -Writing down complicated information and using checklists   -Attempting and completing  one task at a time (i.e., no multi-tasking)    -Verbalizing aloud each step of a task to maintain focus   -Taking frequent breaks during the completion of steps/tasks to avoid fatigue   -Reducing the amount of information considered at one time

## 2020-06-08 NOTE — Telephone Encounter (Signed)
Tried to call but phone just keeps ringing and says try your call again later, no VM. Faxed to requesting providers office via Epic fax function [phone] Marsa Aris, [facility]

## 2020-06-09 DIAGNOSIS — D485 Neoplasm of uncertain behavior of skin: Secondary | ICD-10-CM | POA: Diagnosis not present

## 2020-06-09 DIAGNOSIS — L570 Actinic keratosis: Secondary | ICD-10-CM | POA: Diagnosis not present

## 2020-06-09 DIAGNOSIS — L718 Other rosacea: Secondary | ICD-10-CM | POA: Diagnosis not present

## 2020-06-09 DIAGNOSIS — Z85828 Personal history of other malignant neoplasm of skin: Secondary | ICD-10-CM | POA: Diagnosis not present

## 2020-06-16 ENCOUNTER — Other Ambulatory Visit: Payer: Self-pay

## 2020-06-17 ENCOUNTER — Ambulatory Visit (INDEPENDENT_AMBULATORY_CARE_PROVIDER_SITE_OTHER): Payer: Medicare Other | Admitting: Family Medicine

## 2020-06-17 ENCOUNTER — Encounter: Payer: Self-pay | Admitting: Family Medicine

## 2020-06-17 VITALS — BP 118/80 | HR 58 | Ht 68.0 in | Wt 144.0 lb

## 2020-06-17 DIAGNOSIS — Z85828 Personal history of other malignant neoplasm of skin: Secondary | ICD-10-CM | POA: Diagnosis not present

## 2020-06-17 DIAGNOSIS — L738 Other specified follicular disorders: Secondary | ICD-10-CM | POA: Diagnosis not present

## 2020-06-17 DIAGNOSIS — I2510 Atherosclerotic heart disease of native coronary artery without angina pectoris: Secondary | ICD-10-CM

## 2020-06-17 DIAGNOSIS — F0280 Dementia in other diseases classified elsewhere without behavioral disturbance: Secondary | ICD-10-CM | POA: Diagnosis not present

## 2020-06-17 DIAGNOSIS — L718 Other rosacea: Secondary | ICD-10-CM | POA: Diagnosis not present

## 2020-06-17 DIAGNOSIS — L0889 Other specified local infections of the skin and subcutaneous tissue: Secondary | ICD-10-CM | POA: Diagnosis not present

## 2020-06-17 DIAGNOSIS — I10 Essential (primary) hypertension: Secondary | ICD-10-CM | POA: Diagnosis not present

## 2020-06-17 NOTE — Patient Instructions (Signed)
Cognitive Tips  Keep a journal/notebook with sections for the following (or use sections separately as needed):  Calendar and appointment sheet, schedule for each day, lists of reminders (such as grocery lists or "to do" list), homework assignments for therapy, important information such as family and friends names / addresses / phone numbers, medications, medical history and doctors name / phone numbers.  Avoid / remove clutter and unnecessary items from areas such as countertops / cabinets in kitchen and bathroom, closets, etc.  Organize items by purpose.  Baskets and bins help with this.  Leave notes for reminders above task to be completed.  For example: Note to turn off stove over the the stove; note to lock door beside the door, not to brush teeth then wash face by sink, note to take medication on table etc.  To help recall names of people of people or things, mentally or verbally go through the alphabet to try to determine the 1st letter of the word as this may trigger the name or word you are looking for.  If this is too difficult and someone else knows the word, have them give you the first letter by asking, "does it start with an "A", "B", "C" etc. (or have them give you the first sound of the word or some other clue).  Review family events, occasions, names, etc.  Pictures are a good way to trigger memory.  Have others correct you if you answer something incorrectly.  Have them speak slowly with a few words to give you time to process and respond.  Don't let others automatically problem-solve. (For example: don't let them automatically lay out clothes in the correct position, but hand it to you folded so that you can figure it out for yourself.)  However, if you need help with tasks, they should give you as little as they can so that you can be successful.  If appropriate and safe, they may allow you to make mistakes so that you can figure out how to correct the error.  (For example, they  may allow you to put your shoes on the wrong foot to see if you notice that is wrong).  If you struggle, they should give you a cue.  (Example: "Do your shoes feel right?"  "Do they look right?")  I will set up neurology referral and set up MRI of brain to assess.

## 2020-06-17 NOTE — Progress Notes (Signed)
Established Patient Office Visit  Subjective:  Patient ID: Jesus Jordan, male    DOB: [birth year]  Age: 74 y.o. MRN: [id number]  CC:  Chief Complaint  Patient presents with  . Follow-up    HPI Jesus Jordan presents for follow-up from recent weight loss and cognitive dysfunction.  He has chronic problems including history of CAD, hypertension, COPD, GERD, past history of alcohol abuse, gout, hyperlipidemia.  He is accompanied today by his daughter.  They have set up a container for him to take medications and they think he has been more consistent with taking medications.  His appetite is improved and he has gained about 9 pounds.  They are very pleased with that.  We had referred him for cognitive assessment with neuropsychologist.  His testing suggested fairly diffuse cognitive impairment.  Was felt Alzheimer's disease was most likely but recommendation was to consider MRI brain.  We also previously discussed possible neurology referral.  Jesus Jordan continues to drive but only locally.  He plays tennis still fairly regularly and has good balance and no recent falls.  He had some recent lightheadedness and after changing medications to low-dose metoprolol those have resolved.  No recent chest pain  Past Medical History:  Diagnosis Date  . Alcohol abuse, daily use    05/29/20 - Reported consuming a 6 pack per day  . CAD in native artery 05/03/2016  . COPD (chronic obstructive pulmonary disease) 12/10/2018  . Coronary atherosclerosis 01/29/2009  . Essential hypertension 12/10/2018  . GERD (gastroesophageal reflux disease) 04/18/2018  . Gout   . Hearing loss   . History of MI (myocardial infarction) 09/12/2008   large inferolateral MI secondary to total occlusion of circumflex with VF cardiac arrest  . History of nuclear stress test 02/23/2012   exercise myoview; area of scar in inferolateral wall at mid-basal region  . Hyperlipidemia 03/06/2013   target LDL less than 70  . Inguinal  hernia 05/25/2009  . Major neurocognitive disorder due to multiple etiologies without behavioral disturbance 05/29/2020    Past Surgical History:  Procedure Laterality Date  . CORONARY STENT PLACEMENT  09/12/2008   r/t MI; 3.5x92mm Xience DES to circumflex & PTCA at multiple sites in distal region of vessel; alos diffusely diseaase, narrowed, nondominant RCA  . FOOT SURGERY  1960  . HERNIA REPAIR  2011   left inguinal hernia  . TRANSTHORACIC ECHOCARDIOGRAM  03/02/2009   EF=50-55%; LV systolic function borderline reduced with mild inferior wall hypokinesis; mild-mod MR; mild TR; AV mildly sclerotic; mild aortic root dilatation    Family History  Problem Relation Age of Onset  . Colon cancer Father   . Alcoholism Father   . Hyperlipidemia Brother   . Heart disease Brother 3       massive MI  . Raynaud syndrome Brother   . Cancer Paternal Grandfather        colon  . Dementia Sister        Unspecified type    Social History   Socioeconomic History  . Marital status: Single    Spouse name: Not on file  . Number of children: 3  . Years of education: 16  . Highest education level: High school graduate  Occupational History  . Occupation: Retired    Comment: Patent attorney  Tobacco Use  . Smoking status: Current Some Day Smoker    Years: 30.00    Types: Cigarettes  . Smokeless tobacco: Never Used  . Tobacco comment: skip days some; pack last  3 days   Vaping Use  . Vaping Use: Never used  Substance and Sexual Activity  . Alcohol use: Yes    Alcohol/week: 42.0 standard drinks    Types: 42 Cans of beer per week    Comment: 6 pack per day  . Drug use: No  . Sexual activity: Not on file  Other Topics Concern  . Not on file  Social History Narrative  . Not on file   Social Determinants of Health   Financial Resource Strain: Low Risk   . Difficulty of Paying Living Expenses: Not very hard  Food Insecurity: Not on file  Transportation Needs: No Transportation Needs  .  Lack of Transportation (Medical): No  . Lack of Transportation (Non-Medical): No  Physical Activity: Not on file  Stress: Not on file  Social Connections: Not on file  Intimate Partner Violence: Not on file    Outpatient Medications Prior to Visit  Medication Sig Dispense Refill  . allopurinol (ZYLOPRIM) 300 MG tablet Take 1 tablet (300 mg total) by mouth daily. 90 tablet 0  . aspirin 81 MG tablet Take 81 mg by mouth at bedtime.     . calcium carbonate (TUMS - DOSED IN MG ELEMENTAL CALCIUM) 500 MG chewable tablet Chew 1-2 tablets by mouth as needed for indigestion or heartburn.    . desoximetasone (TOPICORT) 0.25 % cream APPLY TWICE A DAY AS DIRECTED 15 g 0  . donepezil (ARICEPT) 10 MG tablet Take 1 tablet (10 mg total) by mouth at bedtime. 90 tablet 3  . ezetimibe (ZETIA) 10 MG tablet Take 1 tablet by mouth once daily (Patient taking differently: Take 10 mg by mouth at bedtime. ) 90 tablet 3  . fluticasone (CUTIVATE) 0.05 % cream Apply 1 application topically daily as needed (to affected areas of face).     . metoprolol succinate (TOPROL-XL) 25 MG 24 hr tablet Take 1 tablet (25 mg total) by mouth daily. 90 tablet 3  . Milk Thistle 1000 MG CAPS Take 1 capsule by mouth daily.    . nitroGLYCERIN (NITROSTAT) 0.4 MG SL tablet DISSOLVE ONE TABLET UNDER THE TONGUE EVERY 5 MINUTES AS NEEDED FOR CHEST PAIN.  DO NOT EXCEED A TOTAL OF 3 DOSES IN 15 MINUTES...CALL 911 25 tablet 3  . pantoprazole (PROTONIX) 40 MG tablet Take 1 tablet (40 mg total) by mouth daily as needed. 30 tablet 4  . simvastatin (ZOCOR) 40 MG tablet TAKE 1 TABLET BY MOUTH AT BEDTIME 90 tablet 3  . tetrahydrozoline-zinc (VISINE-AC) 0.05-0.25 % ophthalmic solution Place 2 drops into both eyes 3 (three) times daily as needed (for itching ot redness).     . clopidogrel (PLAVIX) 75 MG tablet Take 1 tablet by mouth once daily (Patient not taking: Reported on 04/21/2020) 90 tablet 3  . mirtazapine (REMERON) 15 MG tablet Take 1 tablet (15 mg  total) by mouth at bedtime. (Patient not taking: Reported on 03/06/2020) 30 tablet 5  . mirtazapine (REMERON) 15 MG tablet Take 1 tablet (15 mg total) by mouth at bedtime. 30 tablet 3   No facility-administered medications prior to visit.    Allergies  Allergen Reactions  . Imdur [Isosorbide Nitrate] Other (See Comments)    Caused vertigo  . Lisinopril Other (See Comments)    Possible vertigo??    ROS Review of Systems  Constitutional: Negative for chills and fever.  Respiratory: Negative for cough and shortness of breath.   Cardiovascular: Negative for chest pain.  Gastrointestinal: Negative for abdominal pain.  Genitourinary: Negative for dysuria.  Neurological: Negative for headaches.  Psychiatric/Behavioral: Negative for agitation.      Objective:    Physical Exam Vitals reviewed.  Cardiovascular:     Rate and Rhythm: Normal rate.  Pulmonary:     Effort: Pulmonary effort is normal.     Breath sounds: Normal breath sounds.  Musculoskeletal:     Right lower leg: No edema.     Left lower leg: No edema.  Neurological:     Mental Status: He is alert.     BP 118/80   Pulse (!) 58   Ht 5\' 8"  (1.727 m)   Wt 144 lb (65.3 kg)   SpO2 100%   BMI 21.90 kg/m  Wt Readings from Last 3 Encounters:  06/17/20 144 lb (65.3 kg)  03/06/20 135 lb 3.2 oz (61.3 kg)  02/10/20 136 lb (61.7 kg)     Health Maintenance Due  Topic Date Due  . TETANUS/TDAP  01/08/2020  . COVID-19 Vaccine (3 - Booster for Pfizer series) 02/26/2020  . COLONOSCOPY (Pts 45-12yrs Insurance coverage will need to be confirmed)  05/06/2020    There are no preventive care reminders to display for this patient.  Lab Results  Component Value Date   TSH 1.40 02/10/2020   Lab Results  Component Value Date   WBC 5.9 02/10/2020   HGB 14.8 02/10/2020   HCT 44.1 02/10/2020   MCV 100.0 02/10/2020   PLT 145 02/10/2020   Lab Results  Component Value Date   NA 139 02/10/2020   K 4.6 02/10/2020   CO2  27 02/10/2020   GLUCOSE 74 02/10/2020   BUN 20 02/10/2020   CREATININE 0.90 02/10/2020   BILITOT 0.8 02/10/2020   ALKPHOS 66 12/09/2018   AST 28 02/10/2020   ALT 21 02/10/2020   PROT 6.9 02/10/2020   ALBUMIN 4.0 12/09/2018   CALCIUM 9.3 02/10/2020   ANIONGAP 10 08/28/2019   GFR 95.90 07/10/2017   Lab Results  Component Value Date   CHOL 140 02/10/2020   Lab Results  Component Value Date   HDL 76 02/10/2020   Lab Results  Component Value Date   LDLCALC 49 02/10/2020   Lab Results  Component Value Date   TRIG 66 02/10/2020   Lab Results  Component Value Date   CHOLHDL 1.8 02/10/2020   Lab Results  Component Value Date   HGBA1C  09/13/2008    5.8 (NOTE)   The ADA recommends the following therapeutic goal for glycemic   control related to Hgb A1C measurement:   Goal of Therapy:   < 7.0% Hgb A1C   Reference: American Diabetes Association: Clinical Practice   Recommendations 2008, Diabetes Care,  2008, 31:(Suppl 1).      Assessment & Plan:   #1 cognitive impairment with question of early Alzheimer's dementia.  Patient had recent neurocognitive testing as above.  He remains on Aricept 10 mg daily.  -Set up MRI brain to further assess -Set up neurology referral -We did discuss possible use of Namenda but will defer to neurology judgment  #2 hypertension stable and at goal -Continue low-dose metoprolol  #3 recent weight loss which has improved currently.  He is taking low-dose mirtazapine at night which is helping sleep and appetite perhaps  -Continue current medication regimen  No orders of the defined types were placed in this encounter.   Follow-up: Return in about 3 months (around 09/15/2020).    [name], MD

## 2020-06-23 ENCOUNTER — Encounter: Payer: Self-pay | Admitting: Neurology

## 2020-07-03 ENCOUNTER — Telehealth: Payer: Self-pay | Admitting: Pharmacist

## 2020-07-03 NOTE — Chronic Care Management (AMB) (Signed)
Chronic Care Management Pharmacy Assistant   Name: Jesus Jordan  MRN: [id number] DOB: [birth year]  Reason for Encounter: Disease State and Medication Review  PCP : Eulas Post, MD  Allergies:   Allergies  Allergen Reactions  . Imdur [Isosorbide Nitrate] Other (See Comments)    Caused vertigo  . Lisinopril Other (See Comments)    Possible vertigo??    Medications: Outpatient Encounter Medications as of 07/03/2020  Medication Sig  . allopurinol (ZYLOPRIM) 300 MG tablet Take 1 tablet (300 mg total) by mouth daily.  Marland Kitchen aspirin 81 MG tablet Take 81 mg by mouth at bedtime.   . calcium carbonate (TUMS - DOSED IN MG ELEMENTAL CALCIUM) 500 MG chewable tablet Chew 1-2 tablets by mouth as needed for indigestion or heartburn.  . desoximetasone (TOPICORT) 0.25 % cream APPLY TWICE A DAY AS DIRECTED  . donepezil (ARICEPT) 10 MG tablet Take 1 tablet (10 mg total) by mouth at bedtime.  Marland Kitchen ezetimibe (ZETIA) 10 MG tablet Take 1 tablet by mouth once daily (Patient taking differently: Take 10 mg by mouth at bedtime. )  . fluticasone (CUTIVATE) 0.05 % cream Apply 1 application topically daily as needed (to affected areas of face).   . metoprolol succinate (TOPROL-XL) 25 MG 24 hr tablet Take 1 tablet (25 mg total) by mouth daily.  . Milk Thistle 1000 MG CAPS Take 1 capsule by mouth daily.  . nitroGLYCERIN (NITROSTAT) 0.4 MG SL tablet DISSOLVE ONE TABLET UNDER THE TONGUE EVERY 5 MINUTES AS NEEDED FOR CHEST PAIN.  DO NOT EXCEED A TOTAL OF 3 DOSES IN 15 MINUTES...CALL 911  . pantoprazole (PROTONIX) 40 MG tablet Take 1 tablet (40 mg total) by mouth daily as needed.  . simvastatin (ZOCOR) 40 MG tablet TAKE 1 TABLET BY MOUTH AT BEDTIME  . tetrahydrozoline-zinc (VISINE-AC) 0.05-0.25 % ophthalmic solution Place 2 drops into both eyes 3 (three) times daily as needed (for itching ot redness).    No facility-administered encounter medications on file as of 07/03/2020.    Current Diagnosis: Patient Active  Problem List   Diagnosis Date Noted  . Major neurocognitive disorder due to multiple etiologies without behavioral disturbance 05/29/2020  . Alcohol abuse, daily use   . Hearing loss   . COPD (chronic obstructive pulmonary disease) 12/10/2018  . Essential hypertension 12/10/2018  . Chest pain 12/09/2018  . GERD (gastroesophageal reflux disease) 04/18/2018  . CAD in native artery 05/03/2016  . Hyperlipidemia with target LDL less than 70 03/06/2013  . Inguinal hernia 05/25/2009  . Gout 01/29/2009  . Coronary atherosclerosis 01/29/2009  . History of MI (myocardial infarction) 09/12/2008    Goals Addressed   None    Reviewed chart for medication changes ahead of medication coordination call. Reviewed OVs, Consults, or hospital visits since last care coordination call/Pharmacist visit.  . 06-17-2020 Burchette, Alinda Sierras, MD Family Medicine . 06-05-2020 Hazle Coca, PhD Psychology  Medication changes indicated. Discontinued the following medications - Clopidogrel Bisulfate 75 MG Take 1 tablet by mouth once daily  - Mirtazapine 15 mg Oral Daily at bedtime   BP Readings from Last 3 Encounters:  06/17/20 118/80  03/06/20 110/60  02/10/20 110/62    Lab Results  Component Value Date   HGBA1C  09/13/2008    5.8 (NOTE)   The ADA recommends the following therapeutic goal for glycemic   control related to Hgb A1C measurement:   Goal of Therapy:   < 7.0% Hgb A1C   Reference: American Diabetes Association: Clinical Practice  Recommendations 2008, Diabetes Care,  2008, 31:(Suppl 1).     Patient obtains medications through Adherence Packaging  30 Days  Last adherence delivery included:  . Aspirin 81 mg: one tablet at breakfast . Allopurinol (ZYLOPRIM) 300 mg: one tablet at breakfast . Donepezil (ARICEPT) 10 mg: one tablet ay bedtime . Ezetimibe (ZETIA) 10 mg: one tablet at breakfast . Metoprolol succinate (TOPROL-XL) 25 MG 24 hr: one tablet at breakfast . Simvastatin (ZOCOR) 40 mg:  one tablet at bedtime . Mirtazapine (Remeron) 15 mg: one at bedtime   Patient is due for next adherence delivery on: 07-09-2020. Called patient and reviewed medications and coordinated delivery. This delivery to include: . Aspirin 81 mg: one tablet at breakfast . Allopurinol (ZYLOPRIM) 300 mg: one tablet at breakfast . Donepezil (ARICEPT) 10 mg: one tablet ay bedtime . Ezetimibe (ZETIA) 10 mg: one tablet at breakfast . Metoprolol succinate (TOPROL-XL) 25 MG 24 hr: one tablet at breakfast . Simvastatin (ZOCOR) 40 mg: one tablet at bedtime . Mirtazapine (Remeron) 15 mg: one at bedtime  Patient declined the following medications the following due to PRN use. Marland Kitchen Fluticasone (CUTIVATE) 0.05 % cream . desoximetasone (TOPICORT) 0.25 % cream  The patient currently doe not neef refills Confirmed delivery date of 07/09/2020, advised patient that pharmacy will contact them the morning of delivery.  Follow-Up:  Coordination of Enhanced Pharmacy Services and Pharmacist Review  Reviewed chart prior to disease state call. Spoke with patient regarding BP  Recent Office Vitals: BP Readings from Last 3 Encounters:  06/17/20 118/80  03/06/20 110/60  02/10/20 110/62   Pulse Readings from Last 3 Encounters:  06/17/20 (!) 58  03/06/20 67  02/10/20 65    Wt Readings from Last 3 Encounters:  06/17/20 144 lb (65.3 kg)  03/06/20 135 lb 3.2 oz (61.3 kg)  02/10/20 136 lb (61.7 kg)     Kidney Function Lab Results  Component Value Date/Time   CREATININE 0.90 02/10/2020 11:48 AM   CREATININE 0.97 08/28/2019 03:54 PM   CREATININE 0.96 12/09/2018 08:04 PM   CREATININE 1.07 03/18/2014 10:51 AM   GFR 95.90 07/10/2017 03:45 PM   GFRNONAA >60 08/28/2019 03:54 PM   GFRAA >60 08/28/2019 03:54 PM    BMP Latest Ref Rng & Units 02/10/2020 08/28/2019 12/09/2018  Glucose 65 - 99 mg/dL 74 112(H) 155(H)  BUN 7 - 25 mg/dL 20 15 16   Creatinine 0.70 - 1.18 mg/dL 0.90 0.97 0.96  BUN/Creat Ratio 6 - 22 (calc) NOT  APPLICABLE - -  Sodium A999333 - 146 mmol/L 139 137 134(L)  Potassium 3.5 - 5.3 mmol/L 4.6 4.0 3.9  Chloride 98 - 110 mmol/L 104 101 103  CO2 20 - 32 mmol/L 27 26 22   Calcium 8.6 - 10.3 mg/dL 9.3 9.4 9.2    . Current antihypertensive regimen:  o Metoprolol succinate( Toprol-Xl) 25 mg daily . How often are you checking your Blood Pressure? 1-2x per week . Current home BP readings:  o 01/03 110/72 o 01/06 110/76 o 01/10 112/76 o 01/12 114/68 . What recent interventions/DTPs have been made by any provider to improve Blood Pressure control since last CPP Visit: None . Any recent hospitalizations or ED visits since last visit with CPP? No . What diet changes have been made to improve Blood Pressure Control?  o No changes . What exercise is being done to improve your Blood Pressure Control?  o No changes  Adherence Review: Is the patient currently on ACE/ARB medication? No Does the patient have >5  day gap between last estimated fill dates? No  Maia Breslow, [place] Assistant [phone]

## 2020-07-05 ENCOUNTER — Encounter: Payer: Self-pay | Admitting: Family Medicine

## 2020-07-08 DIAGNOSIS — L718 Other rosacea: Secondary | ICD-10-CM | POA: Diagnosis not present

## 2020-07-08 DIAGNOSIS — Z85828 Personal history of other malignant neoplasm of skin: Secondary | ICD-10-CM | POA: Diagnosis not present

## 2020-07-13 ENCOUNTER — Ambulatory Visit
Admission: RE | Admit: 2020-07-13 | Discharge: 2020-07-13 | Disposition: A | Discharge status: Home / Self Care | Payer: Medicare Other | Source: Ambulatory Visit | Attending: Family Medicine | Admitting: Family Medicine

## 2020-07-13 ENCOUNTER — Other Ambulatory Visit: Payer: Self-pay

## 2020-07-13 DIAGNOSIS — R413 Other amnesia: Secondary | ICD-10-CM | POA: Diagnosis not present

## 2020-07-13 DIAGNOSIS — F0280 Dementia in other diseases classified elsewhere without behavioral disturbance: Secondary | ICD-10-CM

## 2020-07-13 IMAGING — MR MR HEAD W/O CM
12 series · 48 of 48 positions shown · non-contrast
Comparison: None.

CLINICAL DATA: Memory loss.  Cognitive dysfunction.  Hearing loss.

EXAM:
MRI HEAD WITHOUT CONTRAST
TECHNIQUE: Multiplanar, multiecho pulse sequences of the brain and surrounding
structures were obtained without intravenous contrast.

[Series 5: T1 · sagittal · 4.0mm · 0.75mm/px · 3 of 31 slices shown (1 of 2)]
[im 1/31]
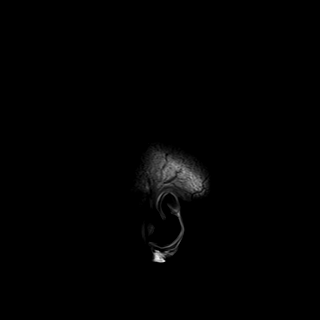
[im 16/31]
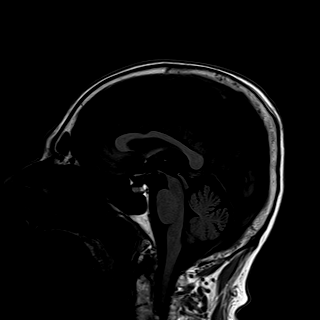
[im 31/31]
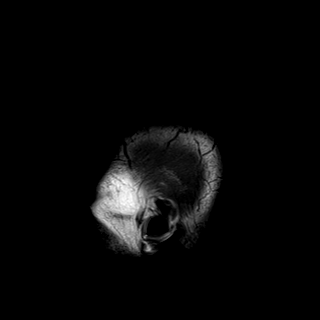

[Series 6: DWI · axial · 3.0mm · 0.94mm/px · z∈[-45,+94]mm · 8 of 160 slices shown (1 of 2)]
[im 1/160]
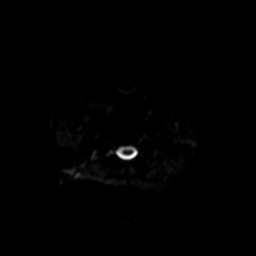
[im 23/160]
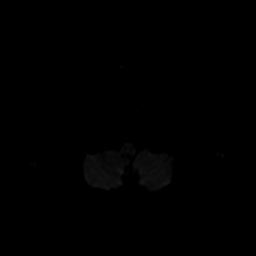
[im 46/160]
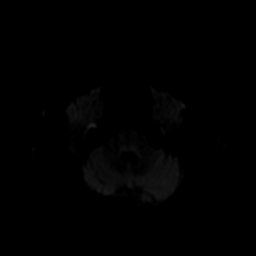
[im 69/160]
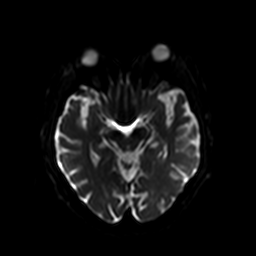
[im 91/160]
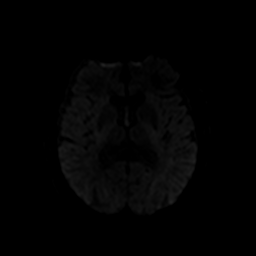
[im 114/160]
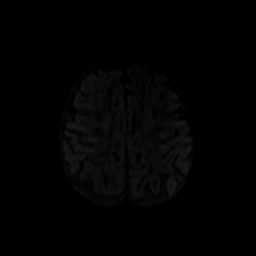
[im 137/160]
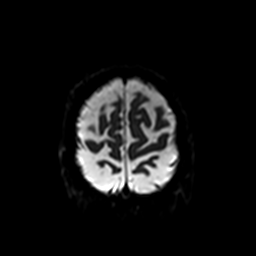
[im 160/160]
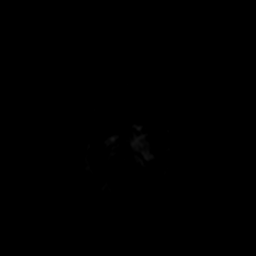

[Series 7: ax dwi_tracew · axial · 3.0mm · 0.94mm/px · z∈[-45,+94]mm · 4 of 80 slices shown]
[im 1/80]
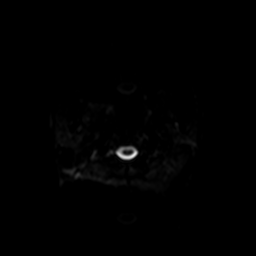
[im 27/80]
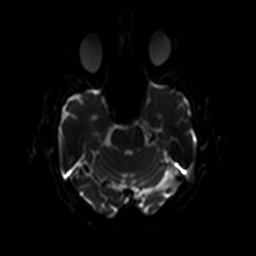
[im 53/80]
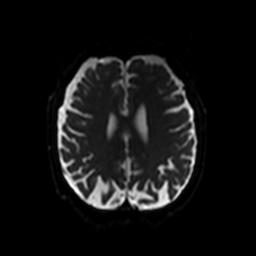
[im 80/80]
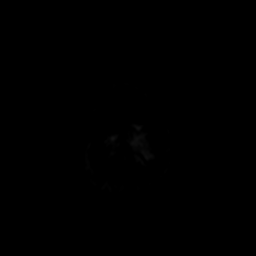

[Series 8: ax dwi_adc · axial · 3.0mm · 0.94mm/px · z∈[-45,+94]mm · 2 of 40 slices shown]
[im 1/40]
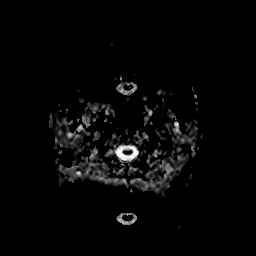
[im 40/40]
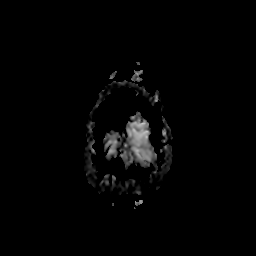

[Series 9: DWI · coronal · 4.0mm · 1.02mm/px · 8 of 154 slices shown (2 of 2)]
[im 1/154]
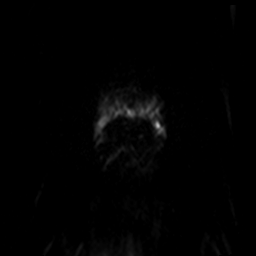
[im 22/154]
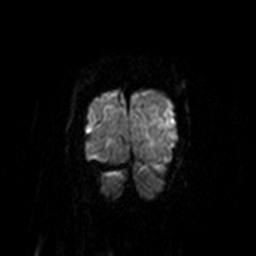
[im 44/154]
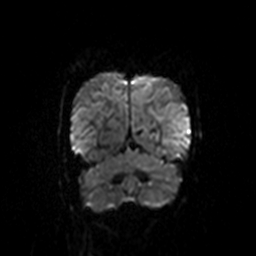
[im 66/154]
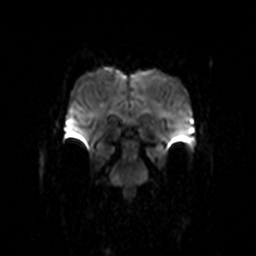
[im 88/154]
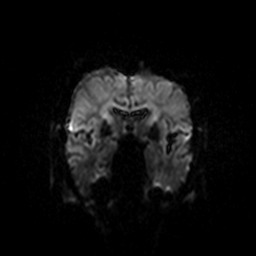
[im 110/154]
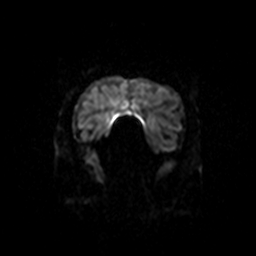
[im 132/154]
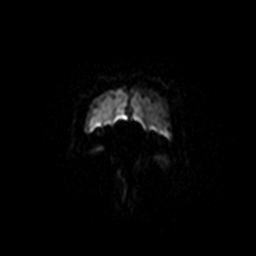
[im 154/154]
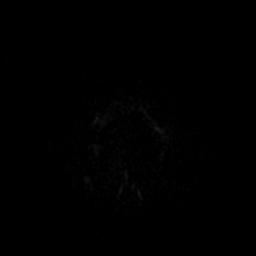

[Series 10: cor dwi_tracew · coronal · 4.0mm · 1.02mm/px · 4 of 77 slices shown]
[im 1/77]
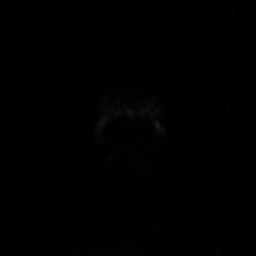
[im 26/77]
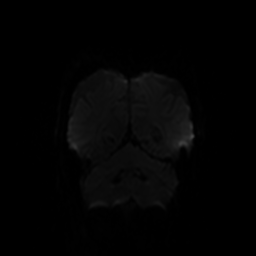
[im 51/77]
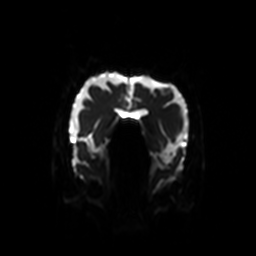
[im 77/77]
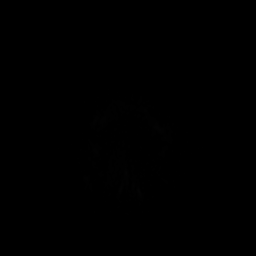

[Series 11: cor dwi_adc · coronal · 4.0mm · 1.02mm/px · 2 of 39 slices shown]
[im 1/39]
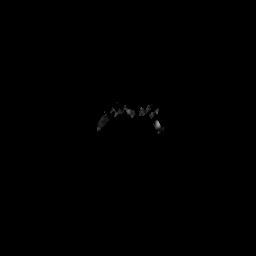
[im 39/39]
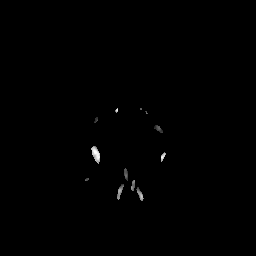

[Series 12: T2 · axial · 4.0mm · 0.36mm/px · 1 of 27 slices shown (1 of 2)]
[im 1/27]
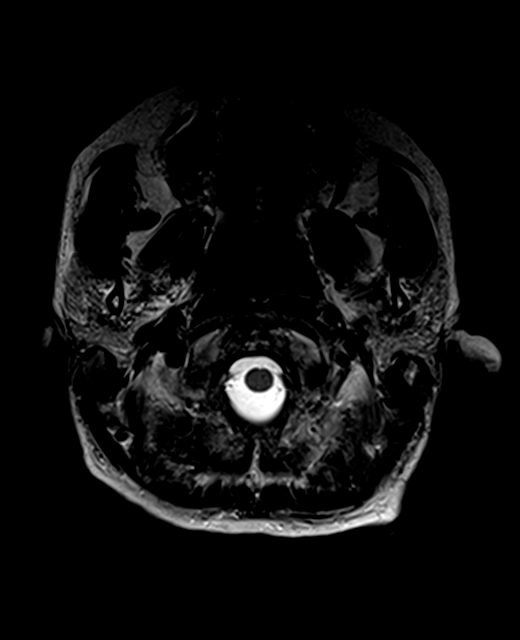

[Series 13: FLAIR · axial · 3.0mm · 0.72mm/px · 1 of 26 slices shown]
[im 1/26]
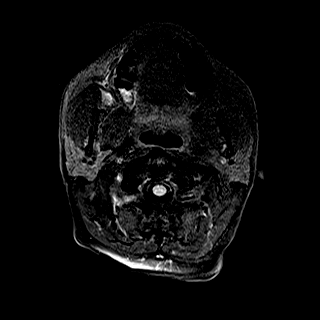

[Series 15: swi_images · axial · 1.5mm · 0.90mm/px · z∈[-49,+92]mm · 5 of 96 slices shown]
[im 1/96]
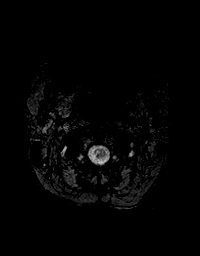
[im 24/96]
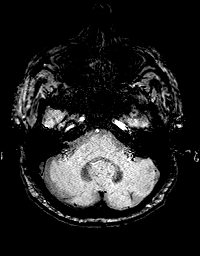
[im 48/96]
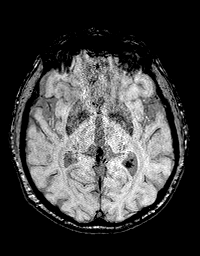
[im 72/96]
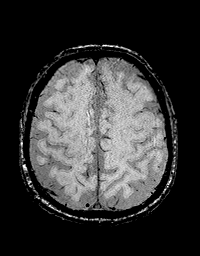
[im 96/96]
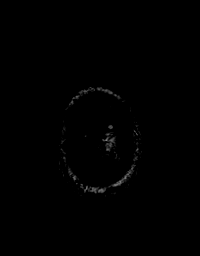

[Series 16: T1 · axial · 1.0mm · 0.94mm/px · z∈[-50,+92]mm · 8 of 144 slices shown (2 of 2)]
[im 1/144]
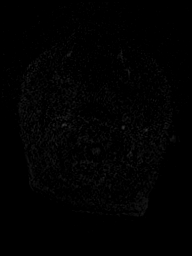
[im 21/144]
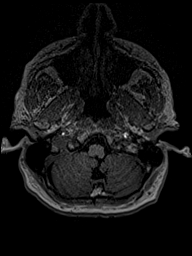
[im 41/144]
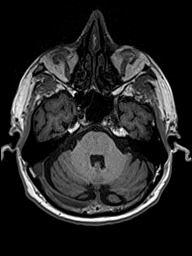
[im 62/144]
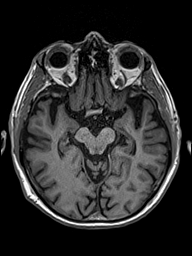
[im 82/144]
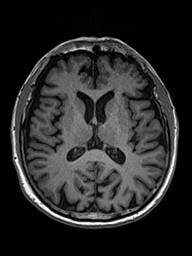
[im 103/144]
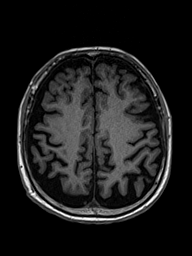
[im 123/144]
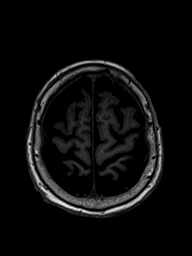
[im 144/144]
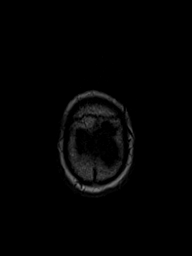

[Series 17: T2 · coronal · 4.5mm · 0.36mm/px · 2 of 30 slices shown (2 of 2)]
[im 1/30]
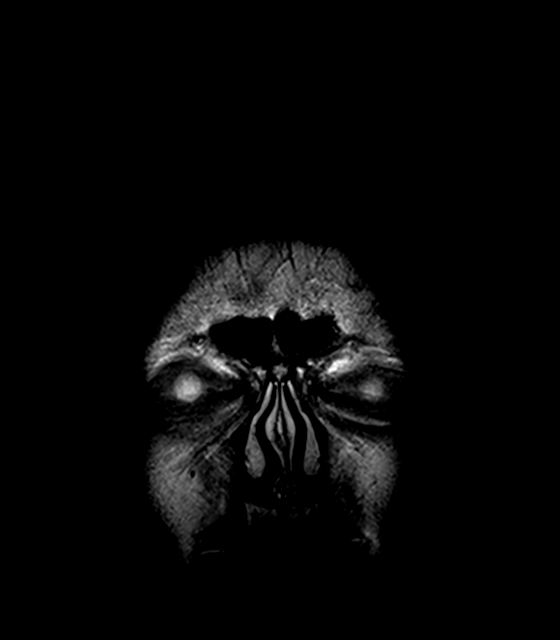
[im 30/30]
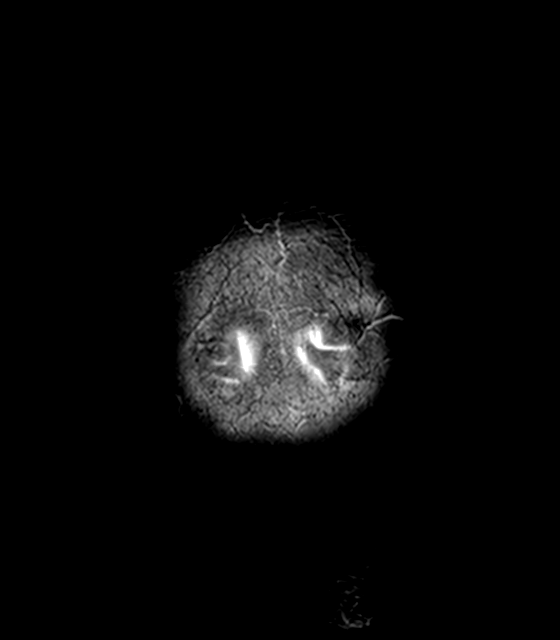

[48 of 48 positions shown; findings below may reference images not displayed]

FINDINGS: Brain: Diffusion imaging does not show any acute or subacute
infarction. No focal abnormality affects the brainstem or
cerebellum. Cerebral hemispheres show generalized atrophy without
lobar predominance. The brain does not show changes of chronic small
vessel disease. No focal infarction evident. No mass, hemorrhage,
hydrocephalus or extra-axial collection.

Vascular: Major vessels at the base of the brain show flow.

Skull and upper cervical spine: Negative

Sinuses/Orbits: Clear/normal

Other: None
IMPRESSION: No acute or reversible finding. Generalized atrophy without lobar
predominance. No evidence of chronic small-vessel ischemic change or
other focal brain insult.

## 2020-07-14 ENCOUNTER — Other Ambulatory Visit: Payer: Self-pay

## 2020-07-14 ENCOUNTER — Telehealth: Payer: Self-pay

## 2020-07-14 ENCOUNTER — Encounter: Payer: Self-pay | Admitting: Family Medicine

## 2020-07-14 ENCOUNTER — Ambulatory Visit (INDEPENDENT_AMBULATORY_CARE_PROVIDER_SITE_OTHER): Payer: Medicare Other | Admitting: Family Medicine

## 2020-07-14 VITALS — BP 112/60 | HR 68 | Ht 68.0 in | Wt 144.0 lb

## 2020-07-14 DIAGNOSIS — F0280 Dementia in other diseases classified elsewhere without behavioral disturbance: Secondary | ICD-10-CM | POA: Diagnosis not present

## 2020-07-14 NOTE — Telephone Encounter (Signed)
-----   Message from Eulas Post, MD sent at 07/13/2020  6:13 PM EST ----- MRI of brain reveals no acute or reversible findings.  He has generalized atrophy which could be expected with his age and diagnosis.

## 2020-07-14 NOTE — Patient Instructions (Signed)
https://point-[email or web address]-care.elsevierperformancemanager.com/skills">  Dementia Caregiver Guide Dementia is a term used to describe a number of symptoms that affect memory and thinking. The most common symptoms include:  Memory loss.  Trouble with language and communication.  Trouble concentrating.  Poor judgment and problems with reasoning.  Wandering from home or public places.  Extreme anxiety or depression.  Being suspicious or having angry outbursts and accusations.  Child-like behavior and language. Dementia can be frightening and confusing. And taking care of someone with dementia can be challenging. This guide provides tips to help you when providing care for a person with dementia. How to help manage lifestyle changes Dementia usually gets worse slowly over time. In the early stages, people with dementia can stay independent and safe with some help. In later stages, they need help with daily tasks such as dressing, grooming, and using the bathroom. There are actions you can take to help a person manage his or her life while living with this condition. Communicating  When the person is talking or seems frustrated, make eye contact and hold the person's hand.  Ask specific questions that need yes or no answers.  Use simple words, short sentences, and a calm voice. Only give one direction at a time.  When offering choices, limit the person to just one or two.  Avoid correcting the person in a negative way.  If the person is struggling to find the right words, gently try to help him or her. Preventing injury  Keep floors clear of clutter. Remove rugs, magazine racks, and floor lamps.  Keep hallways well lit, especially at night.  Put a handrail and nonslip mat in the bathtub or shower.  Put childproof locks on cabinets that contain dangerous items, such as medicines, alcohol, guns, toxic cleaning items, sharp tools or utensils, matches, and lighters.  For doors to the  outside of the house, put the locks in places where the person cannot see or reach them easily. This will help ensure that the person does not wander out of the house and get lost.  Be prepared for emergencies. Keep a list of emergency phone numbers and addresses in a convenient area.  Remove car keys and lock garage doors so that the person does not try to get in the car and drive.  Have the person wear a bracelet that tracks locations and identifies the person as having memory problems. This should be worn at all times for safety.   Helping with daily life  Keep the person on track with his or her routine.  Try to identify areas where the person may need help.  Be supportive, patient, calm, and encouraging.  Gently remind the person that adjusting to changes takes time.  Help with the tasks that the person has asked for help with.  Keep the person involved in daily tasks and decisions as much as possible.  Encourage conversation, but try not to get frustrated if the person struggles to find words or does not seem to appreciate your help.   How to recognize stress Look for signs of stress in yourself and in the person you are caring for. If you notice signs of stress, take steps to manage it. Symptoms of stress include:  Feeling anxious, irritable, frustrated, or angry.  Denying that the person has dementia or that his or her symptoms will not improve.  Feeling depressed, hopeless, or unappreciated.  Difficulty sleeping.  Difficulty concentrating.  Developing stress-related health problems.  Feeling like you have too little time  for your own life. Follow these instructions at home: Take care of your health Make sure that you and the person you are caring for:  Get regular sleep.  Exercise regularly.  Eat regular, nutritious meals.  Take over-the-counter and prescription medicines only as told by your health care providers.  Drink enough fluid to keep your urine pale  yellow.  Attend all scheduled health care appointments.   General instructions  Join a support group with others who are caregivers.  Ask about respite care resources. Respite care can provide short-term care for the person so that you can have a regular break from the stress of caregiving.  Consider any safety risks and take steps to avoid them.  Organize medicines in a pill box for each day of the week.  Create a plan to handle any legal or financial matters. Get legal or financial advice if needed.  Keep a calendar in a central location to remind the person of appointments or other activities. Where to find support: Many individuals and organizations offer support. These include:  Support groups for people with dementia.  Support groups for caregivers.  Counselors or therapists.  Home health care services.  Adult day care centers. Where to find more information  Centers for Disease Control and Prevention: http://www.wolf.info/  Alzheimer's Association: [employer].co.nz  Family Caregiver Alliance: www.caregiver.[place]: www.alzfdn.org Contact a health care provider if:  The person's health is rapidly getting worse.  You are no longer able to care for the person.  Caring for the person is affecting your physical and emotional health.  You are feeling depressed or anxious about caring for the person. Get help right away if:  The person threatens himself or herself, you, or anyone else.  You feel depressed or sad, or feel that you want to harm yourself. If you ever feel like your loved one may hurt himself or herself or others, or if he or she shares thoughts about taking his or her own life, get help right away. You can go to your nearest emergency department or:  Call your local emergency services (911 in the [place]  Call a suicide crisis helpline, such as the [facility] at 914-680-2487. This is open 24 hours a day in  the [place]  Text the Crisis Text Line at 773-264-0673 (in the [place].). Summary  Dementia is a term used to describe a number of symptoms that affect memory and thinking.  Dementia usually gets worse slowly over time.  Take steps to reduce the person's risk of injury and to plan for future care.  Caregivers need support, relief from caregiving, and time for their own lives. This information is not intended to replace advice given to you by your health care provider. Make sure you discuss any questions you have with your health care provider. Document Revised: 10/21/2019 Document Reviewed: 10/21/2019 Elsevier Patient Education  [place].

## 2020-07-14 NOTE — Telephone Encounter (Signed)
Left detailed message for patient regarding results and recommendations. 

## 2020-07-14 NOTE — Progress Notes (Signed)
Established Patient Office Visit  Subjective:  Patient ID: Jesus Jordan, male    DOB: 10-18-1945  Age: 75 y.o. MRN: [id number]  CC:  Chief Complaint  Patient presents with  . Hallucinations    HPI Jesus Jordan presents for follow-up regarding neurocognitive impairment and probable dementia.  He has had neuropsychological evaluation.  We obtained recent MRI scan which showed age-related atrophy but no acute findings.  No evidence for significant cerebrovascular disease.  He is currently on Aricept.  Daughter had called last week stating that he had some hallucinations intermittently.  It seems that these occur more often when he is alone but when he is with family and other support this seems to be less common.  He had commented a couple of times that he was seeing people at night that were not there and at one point was having some type of "party "in his house when no one was there.  His daughter discovered that he had not been taking any of his regular medications.  Since she has been back in town and living with him symptoms have improved and he seems much calmer.Marland Kitchen  He also got some recent hearing aids and that seems to help somewhat with communication.  He is on Remeron which was started largely to try to help with appetite issues and he has gained some weight since starting that.  Sleeping fairly well   Past Medical History:  Diagnosis Date  . Alcohol abuse, daily use    05/29/20 - Reported consuming a 6 pack per day  . CAD in native artery 05/03/2016  . COPD (chronic obstructive pulmonary disease) 12/10/2018  . Coronary atherosclerosis 01/29/2009  . Essential hypertension 12/10/2018  . GERD (gastroesophageal reflux disease) 04/18/2018  . Gout   . Hearing loss   . History of MI (myocardial infarction) 09/12/2008   large inferolateral MI secondary to total occlusion of circumflex with VF cardiac arrest  . History of nuclear stress test 02/23/2012   exercise myoview; area of scar in  inferolateral wall at mid-basal region  . Hyperlipidemia 03/06/2013   target LDL less than 70  . Inguinal hernia 05/25/2009  . Major neurocognitive disorder due to multiple etiologies without behavioral disturbance 05/29/2020    Past Surgical History:  Procedure Laterality Date  . CORONARY STENT PLACEMENT  09/12/2008   r/t MI; 3.5x78mm Xience DES to circumflex & PTCA at multiple sites in distal region of vessel; alos diffusely diseaase, narrowed, nondominant RCA  . FOOT SURGERY  1960  . HERNIA REPAIR  2011   left inguinal hernia  . TRANSTHORACIC ECHOCARDIOGRAM  03/02/2009   CH=85-27%; LV systolic function borderline reduced with mild inferior wall hypokinesis; mild-mod MR; mild TR; AV mildly sclerotic; mild aortic root dilatation    Family History  Problem Relation Age of Onset  . Colon cancer Father   . Alcoholism Father   . Hyperlipidemia Brother   . Heart disease Brother 88       massive MI  . Raynaud syndrome Brother   . Cancer Paternal Grandfather        colon  . Dementia Sister        Unspecified type    Social History   Socioeconomic History  . Marital status: Single    Spouse name: Not on file  . Number of children: 3  . Years of education: 30  . Highest education level: High school graduate  Occupational History  . Occupation: Retired    Comment: Development worker, international aid  Tobacco Use  . Smoking status: Current Some Day Smoker    Years: 30.00    Types: Cigarettes  . Smokeless tobacco: Never Used  . Tobacco comment: skip days some; pack last 3 days   Vaping Use  . Vaping Use: Never used  Substance and Sexual Activity  . Alcohol use: Yes    Alcohol/week: 42.0 standard drinks    Types: 42 Cans of beer per week    Comment: 6 pack per day  . Drug use: No  . Sexual activity: Not on file  Other Topics Concern  . Not on file  Social History Narrative  . Not on file   Social Determinants of Health   Financial Resource Strain: Low Risk   . Difficulty of Paying Living  Expenses: Not very hard  Food Insecurity: Not on file  Transportation Needs: No Transportation Needs  . Lack of Transportation (Medical): No  . Lack of Transportation (Non-Medical): No  Physical Activity: Not on file  Stress: Not on file  Social Connections: Not on file  Intimate Partner Violence: Not on file    Outpatient Medications Prior to Visit  Medication Sig Dispense Refill  . allopurinol (ZYLOPRIM) 300 MG tablet Take 1 tablet (300 mg total) by mouth daily. 90 tablet 0  . aspirin 81 MG tablet Take 81 mg by mouth at bedtime.     . calcium carbonate (TUMS - DOSED IN MG ELEMENTAL CALCIUM) 500 MG chewable tablet Chew 1-2 tablets by mouth as needed for indigestion or heartburn.    . desoximetasone (TOPICORT) 0.25 % cream APPLY TWICE A DAY AS DIRECTED 15 g 0  . donepezil (ARICEPT) 10 MG tablet Take 1 tablet (10 mg total) by mouth at bedtime. 90 tablet 3  . ezetimibe (ZETIA) 10 MG tablet Take 1 tablet by mouth once daily (Patient taking differently: Take 10 mg by mouth at bedtime.) 90 tablet 3  . fluticasone (CUTIVATE) 0.05 % cream Apply 1 application topically daily as needed (to affected areas of face).     . metoprolol succinate (TOPROL-XL) 25 MG 24 hr tablet Take 1 tablet (25 mg total) by mouth daily. 90 tablet 3  . Milk Thistle 1000 MG CAPS Take 1 capsule by mouth daily.    . mirtazapine (REMERON) 15 MG tablet Take 15 mg by mouth at bedtime.    . nitroGLYCERIN (NITROSTAT) 0.4 MG SL tablet DISSOLVE ONE TABLET UNDER THE TONGUE EVERY 5 MINUTES AS NEEDED FOR CHEST PAIN.  DO NOT EXCEED A TOTAL OF 3 DOSES IN 15 MINUTES...CALL 911 25 tablet 3  . pantoprazole (PROTONIX) 40 MG tablet Take 1 tablet (40 mg total) by mouth daily as needed. 30 tablet 4  . simvastatin (ZOCOR) 40 MG tablet TAKE 1 TABLET BY MOUTH AT BEDTIME 90 tablet 3  . tetrahydrozoline-zinc (VISINE-AC) 0.05-0.25 % ophthalmic solution Place 2 drops into both eyes 3 (three) times daily as needed (for itching ot redness).      No  facility-administered medications prior to visit.    Allergies  Allergen Reactions  . Imdur [Isosorbide Nitrate] Other (See Comments)    Caused vertigo  . Lisinopril Other (See Comments)    Possible vertigo??    ROS Review of Systems  Constitutional: Negative for chills and fever.  Respiratory: Negative for cough and shortness of breath.   Cardiovascular: Negative for chest pain.  Gastrointestinal: Negative for abdominal pain.  Genitourinary: Negative for dysuria.  Neurological: Negative for headaches.  Psychiatric/Behavioral: Negative for agitation.  Objective:    Physical Exam Vitals reviewed.  Constitutional:      Appearance: Normal appearance.  Cardiovascular:     Rate and Rhythm: Normal rate and regular rhythm.  Pulmonary:     Effort: Pulmonary effort is normal. No respiratory distress.     Breath sounds: No wheezing or rales.  Musculoskeletal:     Right lower leg: No edema.     Left lower leg: No edema.  Neurological:     General: No focal deficit present.     Mental Status: He is alert.     BP 112/60   Pulse 68   Ht 5\' 8"  (1.727 m)   Wt 144 lb (65.3 kg)   SpO2 97%   BMI 21.90 kg/m  Wt Readings from Last 3 Encounters:  07/14/20 144 lb (65.3 kg)  06/17/20 144 lb (65.3 kg)  03/06/20 135 lb 3.2 oz (61.3 kg)     Health Maintenance Due  Topic Date Due  . TETANUS/TDAP  01/08/2020  . COVID-19 Vaccine (3 - Booster for Pfizer series) 02/26/2020  . COLONOSCOPY (Pts 45-42yrs Insurance coverage will need to be confirmed)  05/06/2020    There are no preventive care reminders to display for this patient.  Lab Results  Component Value Date   TSH 1.40 02/10/2020   Lab Results  Component Value Date   WBC 5.9 02/10/2020   HGB 14.8 02/10/2020   HCT 44.1 02/10/2020   MCV 100.0 02/10/2020   PLT 145 02/10/2020   Lab Results  Component Value Date   NA 139 02/10/2020   K 4.6 02/10/2020   CO2 27 02/10/2020   GLUCOSE 74 02/10/2020   BUN 20  02/10/2020   CREATININE 0.90 02/10/2020   BILITOT 0.8 02/10/2020   ALKPHOS 66 12/09/2018   AST 28 02/10/2020   ALT 21 02/10/2020   PROT 6.9 02/10/2020   ALBUMIN 4.0 12/09/2018   CALCIUM 9.3 02/10/2020   ANIONGAP 10 08/28/2019   GFR 95.90 07/10/2017   Lab Results  Component Value Date   CHOL 140 02/10/2020   Lab Results  Component Value Date   HDL 76 02/10/2020   Lab Results  Component Value Date   LDLCALC 49 02/10/2020   Lab Results  Component Value Date   TRIG 66 02/10/2020   Lab Results  Component Value Date   CHOLHDL 1.8 02/10/2020   Lab Results  Component Value Date   HGBA1C  09/13/2008    5.8 (NOTE)   The ADA recommends the following therapeutic goal for glycemic   control related to Hgb A1C measurement:   Goal of Therapy:   < 7.0% Hgb A1C   Reference: American Diabetes Association: Clinical Practice   Recommendations 2008, Diabetes Care,  2008, 31:(Suppl 1).      Assessment & Plan:   Neurocognitive impairment with probable dementia.  Recent MRI results reviewed with patient and daughter.  -We discussed possible change of medications perhaps the low-dose SSRI to help with any agitation but at this point daughter would like to continue current course.  We discussed discontinuing mirtazapine but this has helped substantially with his appetite and she is reluctant to stop that at this time.  We recommended against atypical antipsychotics unless agitation becomes severe because of risk of increased stroke and sudden death.  We also discouraged use of benzodiazepines because of risk of falls.  Would consider low-dose Lexapro or citalopram if he has future increased agitation at this point things seem relatively stable  They have appt  with neurologist pending.   Continue Aricept.    No orders of the defined types were placed in this encounter.   Follow-up: No follow-ups on file.    [name], MD

## 2020-07-21 ENCOUNTER — Ambulatory Visit: Payer: Medicare Other

## 2020-07-28 ENCOUNTER — Telehealth: Payer: Self-pay | Admitting: Pharmacist

## 2020-07-28 NOTE — Chronic Care Management (AMB) (Signed)
Chronic Care Management Pharmacy Assistant   Name: Jesus Jordan  MRN: [id number] DOB: April 04, 1946  Reason for Encounter: Medication Review  PCP : Eulas Post, MD  Allergies:   Allergies  Allergen Reactions  . Imdur [Isosorbide Nitrate] Other (See Comments)    Caused vertigo  . Lisinopril Other (See Comments)    Possible vertigo??    Medications: Outpatient Encounter Medications as of 07/28/2020  Medication Sig  . allopurinol (ZYLOPRIM) 300 MG tablet Take 1 tablet (300 mg total) by mouth daily.  Marland Kitchen aspirin 81 MG tablet Take 81 mg by mouth at bedtime.   . calcium carbonate (TUMS - DOSED IN MG ELEMENTAL CALCIUM) 500 MG chewable tablet Chew 1-2 tablets by mouth as needed for indigestion or heartburn.  . desoximetasone (TOPICORT) 0.25 % cream APPLY TWICE A DAY AS DIRECTED  . donepezil (ARICEPT) 10 MG tablet Take 1 tablet (10 mg total) by mouth at bedtime.  Marland Kitchen ezetimibe (ZETIA) 10 MG tablet Take 1 tablet by mouth once daily (Patient taking differently: Take 10 mg by mouth at bedtime.)  . fluticasone (CUTIVATE) 0.05 % cream Apply 1 application topically daily as needed (to affected areas of face).   . metoprolol succinate (TOPROL-XL) 25 MG 24 hr tablet Take 1 tablet (25 mg total) by mouth daily.  . Milk Thistle 1000 MG CAPS Take 1 capsule by mouth daily.  . mirtazapine (REMERON) 15 MG tablet Take 15 mg by mouth at bedtime.  . nitroGLYCERIN (NITROSTAT) 0.4 MG SL tablet DISSOLVE ONE TABLET UNDER THE TONGUE EVERY 5 MINUTES AS NEEDED FOR CHEST PAIN.  DO NOT EXCEED A TOTAL OF 3 DOSES IN 15 MINUTES...CALL 911  . pantoprazole (PROTONIX) 40 MG tablet Take 1 tablet (40 mg total) by mouth daily as needed.  . simvastatin (ZOCOR) 40 MG tablet TAKE 1 TABLET BY MOUTH AT BEDTIME  . tetrahydrozoline-zinc (VISINE-AC) 0.05-0.25 % ophthalmic solution Place 2 drops into both eyes 3 (three) times daily as needed (for itching ot redness).    No facility-administered encounter medications on file as of  07/28/2020.    Current Diagnosis: Patient Active Problem List   Diagnosis Date Noted  . Major neurocognitive disorder due to multiple etiologies without behavioral disturbance 05/29/2020  . Alcohol abuse, daily use   . Hearing loss   . COPD (chronic obstructive pulmonary disease) 12/10/2018  . Essential hypertension 12/10/2018  . Chest pain 12/09/2018  . GERD (gastroesophageal reflux disease) 04/18/2018  . CAD in native artery 05/03/2016  . Hyperlipidemia with target LDL less than 70 03/06/2013  . Inguinal hernia 05/25/2009  . Gout 01/29/2009  . Coronary atherosclerosis 01/29/2009  . History of MI (myocardial infarction) 09/12/2008    Goals Addressed   None    Reviewed chart for medication changes ahead of medication coordination call. No OVs, Consults, or hospital visits since last care coordination call/Pharmacist visit.  No medication changes indicated.  BP Readings from Last 3 Encounters:  07/14/20 112/60  06/17/20 118/80  03/06/20 110/60    Lab Results  Component Value Date   HGBA1C  09/13/2008    5.8 (NOTE)   The ADA recommends the following therapeutic goal for glycemic   control related to Hgb A1C measurement:   Goal of Therapy:   < 7.0% Hgb A1C   Reference: American Diabetes Association: Clinical Practice   Recommendations 2008, Diabetes Care,  2008, 31:(Suppl 1).     Patient obtains medications through Adherence Packaging  30 Days  Last adherence delivery included:  . Aspirin  81 mg: one tablet at breakfast . Allopurinol (ZYLOPRIM) 300 mg: one tablet at breakfast . Donepezil (ARICEPT) 10 mg: one tablet ay bedtime . Ezetimibe (ZETIA) 10 mg: one tablet at breakfast . Metoprolol succinate (TOPROL-XL) 25 MG 24 hr: one tablet at breakfast . Simvastatin (ZOCOR) 40 mg: one tablet at bedtime . Mirtazapine (Remeron) 15 mg: one at bedtime  Patient declined the following medication last month due to PRN use. Marland Kitchen Fluticasone (CUTIVATE) 0.05 % cream . desoximetasone  (TOPICORT) 0.25 % cream  Patient is due for next adherence delivery on: 08/07/2020. Called patient and reviewed medications and coordinated delivery. This delivery to include: .  Aspirin 81 mg: one tablet at breakfast . Allopurinol (ZYLOPRIM) 300 mg: one tablet at breakfast . Donepezil (ARICEPT) 10 mg: one tablet ay bedtime . Ezetimibe (ZETIA) 10 mg: one tablet at breakfast . Metoprolol succinate (TOPROL-XL) 25 MG 24 hr: one tablet at breakfast . Simvastatin (ZOCOR) 40 mg: one tablet at bedtime . Mirtazapine (Remeron) 15 mg: one at bedtime  He currently does not need refills. Confirmed delivery date of 08/07/2020, advised patient that pharmacy will contact them the morning of delivery. Follow-Up:  Coordination of Enhanced Pharmacy Services and Pharmacist Review   Maia Breslow, [place] Assistant [phone]

## 2020-08-27 ENCOUNTER — Telehealth: Payer: Self-pay | Admitting: Pharmacist

## 2020-08-27 NOTE — Chronic Care Management (AMB) (Addendum)
Chronic Care Management Pharmacy Assistant   Name: Jesus Jordan  MRN: [id number] DOB: Nov 12, 1945  Reason for Encounter: Medication Review   Recent office visits:  None  Recent consult visits:  None  Hospital visits:  None in previous 6 months  Medications: Outpatient Encounter Medications as of 08/27/2020  Medication Sig   allopurinol (ZYLOPRIM) 300 MG tablet Take 1 tablet (300 mg total) by mouth daily.   aspirin 81 MG tablet Take 81 mg by mouth at bedtime.    calcium carbonate (TUMS - DOSED IN MG ELEMENTAL CALCIUM) 500 MG chewable tablet Chew 1-2 tablets by mouth as needed for indigestion or heartburn.   desoximetasone (TOPICORT) 0.25 % cream APPLY TWICE A DAY AS DIRECTED   donepezil (ARICEPT) 10 MG tablet Take 1 tablet (10 mg total) by mouth at bedtime.   ezetimibe (ZETIA) 10 MG tablet Take 1 tablet by mouth once daily (Patient taking differently: Take 10 mg by mouth at bedtime.)   fluticasone (CUTIVATE) 0.05 % cream Apply 1 application topically daily as needed (to affected areas of face).    metoprolol succinate (TOPROL-XL) 25 MG 24 hr tablet Take 1 tablet (25 mg total) by mouth daily.   Milk Thistle 1000 MG CAPS Take 1 capsule by mouth daily.   mirtazapine (REMERON) 15 MG tablet Take 15 mg by mouth at bedtime.   nitroGLYCERIN (NITROSTAT) 0.4 MG SL tablet DISSOLVE ONE TABLET UNDER THE TONGUE EVERY 5 MINUTES AS NEEDED FOR CHEST PAIN.  DO NOT EXCEED A TOTAL OF 3 DOSES IN 15 MINUTES...CALL 911   pantoprazole (PROTONIX) 40 MG tablet Take 1 tablet (40 mg total) by mouth daily as needed.   simvastatin (ZOCOR) 40 MG tablet TAKE 1 TABLET BY MOUTH AT BEDTIME   tetrahydrozoline-zinc (VISINE-AC) 0.05-0.25 % ophthalmic solution Place 2 drops into both eyes 3 (three) times daily as needed (for itching ot redness).    No facility-administered encounter medications on file as of 08/27/2020.   Reviewed chart for medication changes ahead of medication coordination call.  BP Readings from  Last 3 Encounters:  07/14/20 112/60  06/17/20 118/80  03/06/20 110/60    Lab Results  Component Value Date   HGBA1C  09/13/2008    5.8 (NOTE)   The ADA recommends the following therapeutic goal for glycemic   control related to Hgb A1C measurement:   Goal of Therapy:   < 7.0% Hgb A1C   Reference: American Diabetes Association: Clinical Practice   Recommendations 2008, Diabetes Care,  2008, 31:(Suppl 1).     Patient obtains medications through Adherence Packaging  30 Days  Last adherence delivery included:  Aspirin 81 mg: one tablet at breakfast Allopurinol (ZYLOPRIM) 300 mg: one tablet at breakfast Donepezil (ARICEPT) 10 mg: one tablet ay bedtime Ezetimibe (ZETIA) 10 mg: one tablet at breakfast Metoprolol succinate (TOPROL-XL) 25 MG 24 hr: one tablet at breakfast Simvastatin (ZOCOR) 40 mg: one tablet at bedtime Mirtazapine (Remeron) 15 mg: one at bedtime  Patient declined the following medications  last month due to PRN use. Fluticasone (CUTIVATE) 0.05 % cream Desoximetasone (TOPICORT) 0.25 % cream  Patient is due for next adherence delivery on: 09/04/2020. Called patient and reviewed medications and coordinated delivery. This delivery to include: Aspirin 81 mg: one tablet at breakfast Allopurinol (ZYLOPRIM) 300 mg: one tablet at breakfast Donepezil (ARICEPT) 10 mg: one tablet ay bedtime Ezetimibe (ZETIA) 10 mg: one tablet at breakfast Metoprolol succinate (TOPROL-XL) 25 MG 24 hr: one tablet at breakfast Simvastatin (ZOCOR) 40 mg: one tablet  at bedtime Mirtazapine (Remeron) 15 mg: one at bedtime  Patient declined the following medications due to PRN use. Fluticasone (CUTIVATE) 0.05 % cream Desoximetasone (TOPICORT) 0.25 % cream  Patient needs refills for : Allopurinol (ZYLOPRIM) 300 mg Mirtazapine (Remeron) 15 mg  Ezetimibe (ZETIA) 10 mg   Confirmed delivery date of 09/04/2020, advised patient that pharmacy will contact them the morning of delivery.    Star Rating  Drugs:  Dispensed Days supply Quantity   Simvastatin 40 mg 02.16.2022 30 30 each    Arlis Porta Revonda Standard, [place] Assistant [phone]

## 2020-09-01 ENCOUNTER — Ambulatory Visit (INDEPENDENT_AMBULATORY_CARE_PROVIDER_SITE_OTHER): Payer: Medicare Other | Admitting: Family Medicine

## 2020-09-01 ENCOUNTER — Emergency Department (HOSPITAL_BASED_OUTPATIENT_CLINIC_OR_DEPARTMENT_OTHER): Payer: Medicare Other | Admitting: Radiology

## 2020-09-01 ENCOUNTER — Other Ambulatory Visit: Payer: Self-pay

## 2020-09-01 ENCOUNTER — Inpatient Hospital Stay (HOSPITAL_BASED_OUTPATIENT_CLINIC_OR_DEPARTMENT_OTHER)
Admission: EM | Admit: 2020-09-01 | Discharge: 2020-09-10 | DRG: 480 | Disposition: A | Discharge status: Skilled Nursing Facility | Payer: Medicare Other | Attending: Internal Medicine | Admitting: Internal Medicine

## 2020-09-01 ENCOUNTER — Encounter: Payer: Self-pay | Admitting: Family Medicine

## 2020-09-01 ENCOUNTER — Encounter (HOSPITAL_BASED_OUTPATIENT_CLINIC_OR_DEPARTMENT_OTHER): Payer: Self-pay

## 2020-09-01 VITALS — BP 120/72 | HR 67 | Temp 98.0°F | Ht 68.0 in | Wt 146.6 lb

## 2020-09-01 DIAGNOSIS — Z8249 Family history of ischemic heart disease and other diseases of the circulatory system: Secondary | ICD-10-CM

## 2020-09-01 DIAGNOSIS — H9190 Unspecified hearing loss, unspecified ear: Secondary | ICD-10-CM | POA: Diagnosis present

## 2020-09-01 DIAGNOSIS — I10 Essential (primary) hypertension: Secondary | ICD-10-CM | POA: Diagnosis present

## 2020-09-01 DIAGNOSIS — Z955 Presence of coronary angioplasty implant and graft: Secondary | ICD-10-CM

## 2020-09-01 DIAGNOSIS — F17210 Nicotine dependence, cigarettes, uncomplicated: Secondary | ICD-10-CM | POA: Diagnosis present

## 2020-09-01 DIAGNOSIS — W010XXA Fall on same level from slipping, tripping and stumbling without subsequent striking against object, initial encounter: Secondary | ICD-10-CM | POA: Diagnosis present

## 2020-09-01 DIAGNOSIS — Z79899 Other long term (current) drug therapy: Secondary | ICD-10-CM

## 2020-09-01 DIAGNOSIS — Z888 Allergy status to other drugs, medicaments and biological substances status: Secondary | ICD-10-CM

## 2020-09-01 DIAGNOSIS — J449 Chronic obstructive pulmonary disease, unspecified: Secondary | ICD-10-CM | POA: Diagnosis present

## 2020-09-01 DIAGNOSIS — S72001A Fracture of unspecified part of neck of right femur, initial encounter for closed fracture: Secondary | ICD-10-CM | POA: Diagnosis present

## 2020-09-01 DIAGNOSIS — I2510 Atherosclerotic heart disease of native coronary artery without angina pectoris: Secondary | ICD-10-CM | POA: Diagnosis not present

## 2020-09-01 DIAGNOSIS — Z419 Encounter for procedure for purposes other than remedying health state, unspecified: Secondary | ICD-10-CM

## 2020-09-01 DIAGNOSIS — G5601 Carpal tunnel syndrome, right upper limb: Secondary | ICD-10-CM

## 2020-09-01 DIAGNOSIS — Z66 Do not resuscitate: Secondary | ICD-10-CM | POA: Diagnosis present

## 2020-09-01 DIAGNOSIS — G3183 Dementia with Lewy bodies: Secondary | ICD-10-CM | POA: Diagnosis present

## 2020-09-01 DIAGNOSIS — S72144A Nondisplaced intertrochanteric fracture of right femur, initial encounter for closed fracture: Principal | ICD-10-CM | POA: Diagnosis present

## 2020-09-01 DIAGNOSIS — Z7982 Long term (current) use of aspirin: Secondary | ICD-10-CM

## 2020-09-01 DIAGNOSIS — S72009A Fracture of unspecified part of neck of unspecified femur, initial encounter for closed fracture: Secondary | ICD-10-CM | POA: Diagnosis present

## 2020-09-01 DIAGNOSIS — G928 Other toxic encephalopathy: Secondary | ICD-10-CM | POA: Diagnosis present

## 2020-09-01 DIAGNOSIS — E785 Hyperlipidemia, unspecified: Secondary | ICD-10-CM | POA: Diagnosis present

## 2020-09-01 DIAGNOSIS — Z20822 Contact with and (suspected) exposure to covid-19: Secondary | ICD-10-CM | POA: Diagnosis present

## 2020-09-01 DIAGNOSIS — F10239 Alcohol dependence with withdrawal, unspecified: Secondary | ICD-10-CM | POA: Diagnosis present

## 2020-09-01 DIAGNOSIS — I252 Old myocardial infarction: Secondary | ICD-10-CM

## 2020-09-01 DIAGNOSIS — M109 Gout, unspecified: Secondary | ICD-10-CM | POA: Diagnosis present

## 2020-09-01 DIAGNOSIS — S72141A Displaced intertrochanteric fracture of right femur, initial encounter for closed fracture: Secondary | ICD-10-CM

## 2020-09-01 DIAGNOSIS — F0280 Dementia in other diseases classified elsewhere without behavioral disturbance: Secondary | ICD-10-CM | POA: Diagnosis present

## 2020-09-01 DIAGNOSIS — S72101A Unspecified trochanteric fracture of right femur, initial encounter for closed fracture: Secondary | ICD-10-CM | POA: Diagnosis not present

## 2020-09-01 DIAGNOSIS — Z811 Family history of alcohol abuse and dependence: Secondary | ICD-10-CM

## 2020-09-01 DIAGNOSIS — K219 Gastro-esophageal reflux disease without esophagitis: Secondary | ICD-10-CM | POA: Diagnosis present

## 2020-09-01 IMAGING — DX DG HIP (WITH OR WITHOUT PELVIS) 2-3V*R*
3 series · 3 of 3 positions shown · non-contrast
Comparison: None.

CLINICAL DATA: Fall with hip pain

EXAM:
DG HIP (WITH OR WITHOUT PELVIS) 2-3V RIGHT

[pelvis ap]
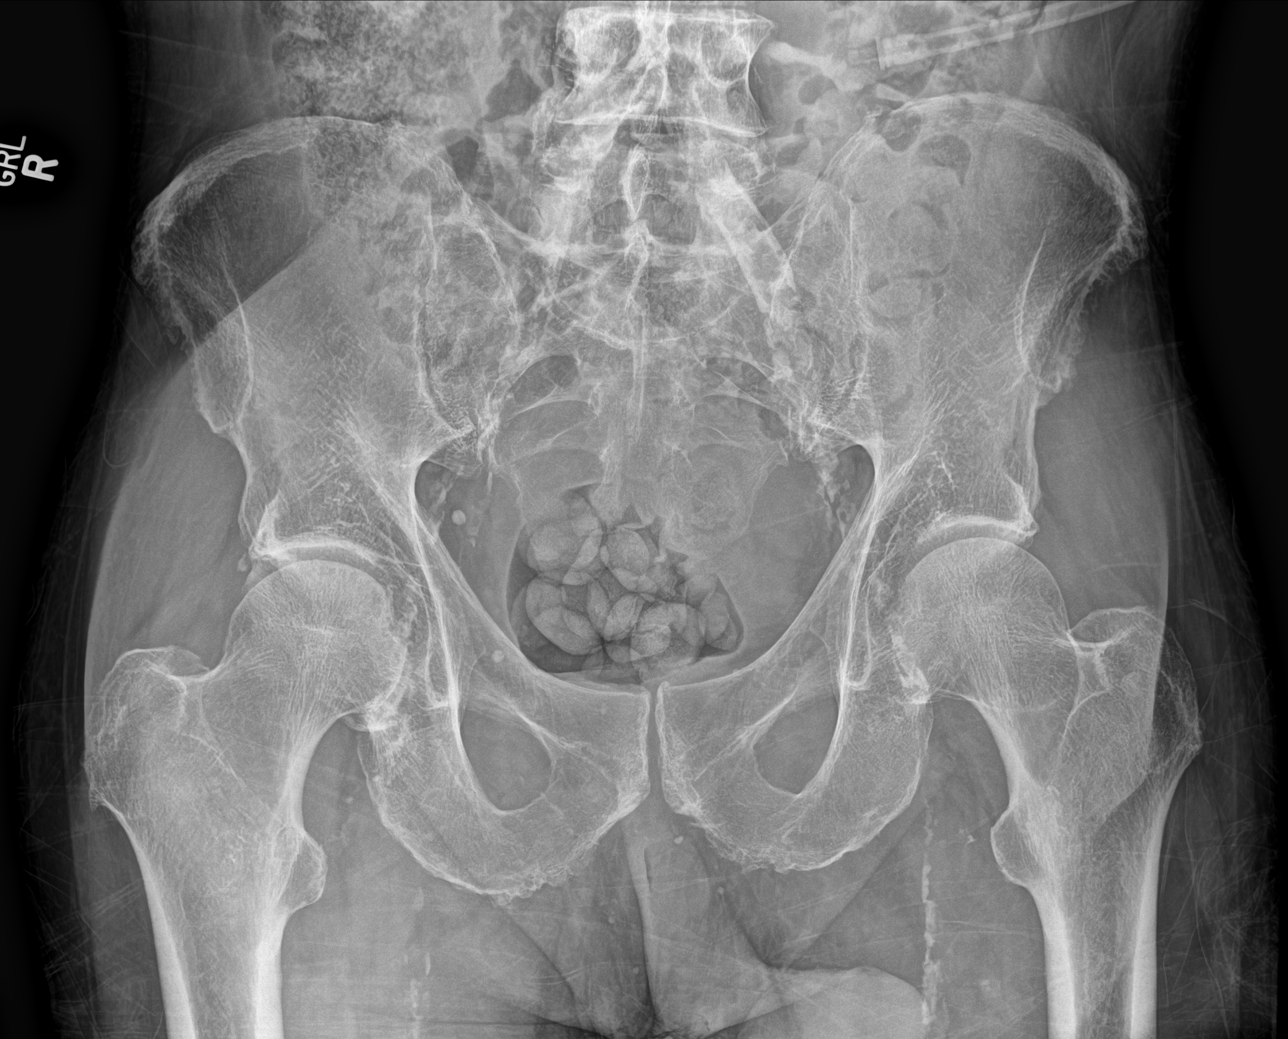

[hip ap]
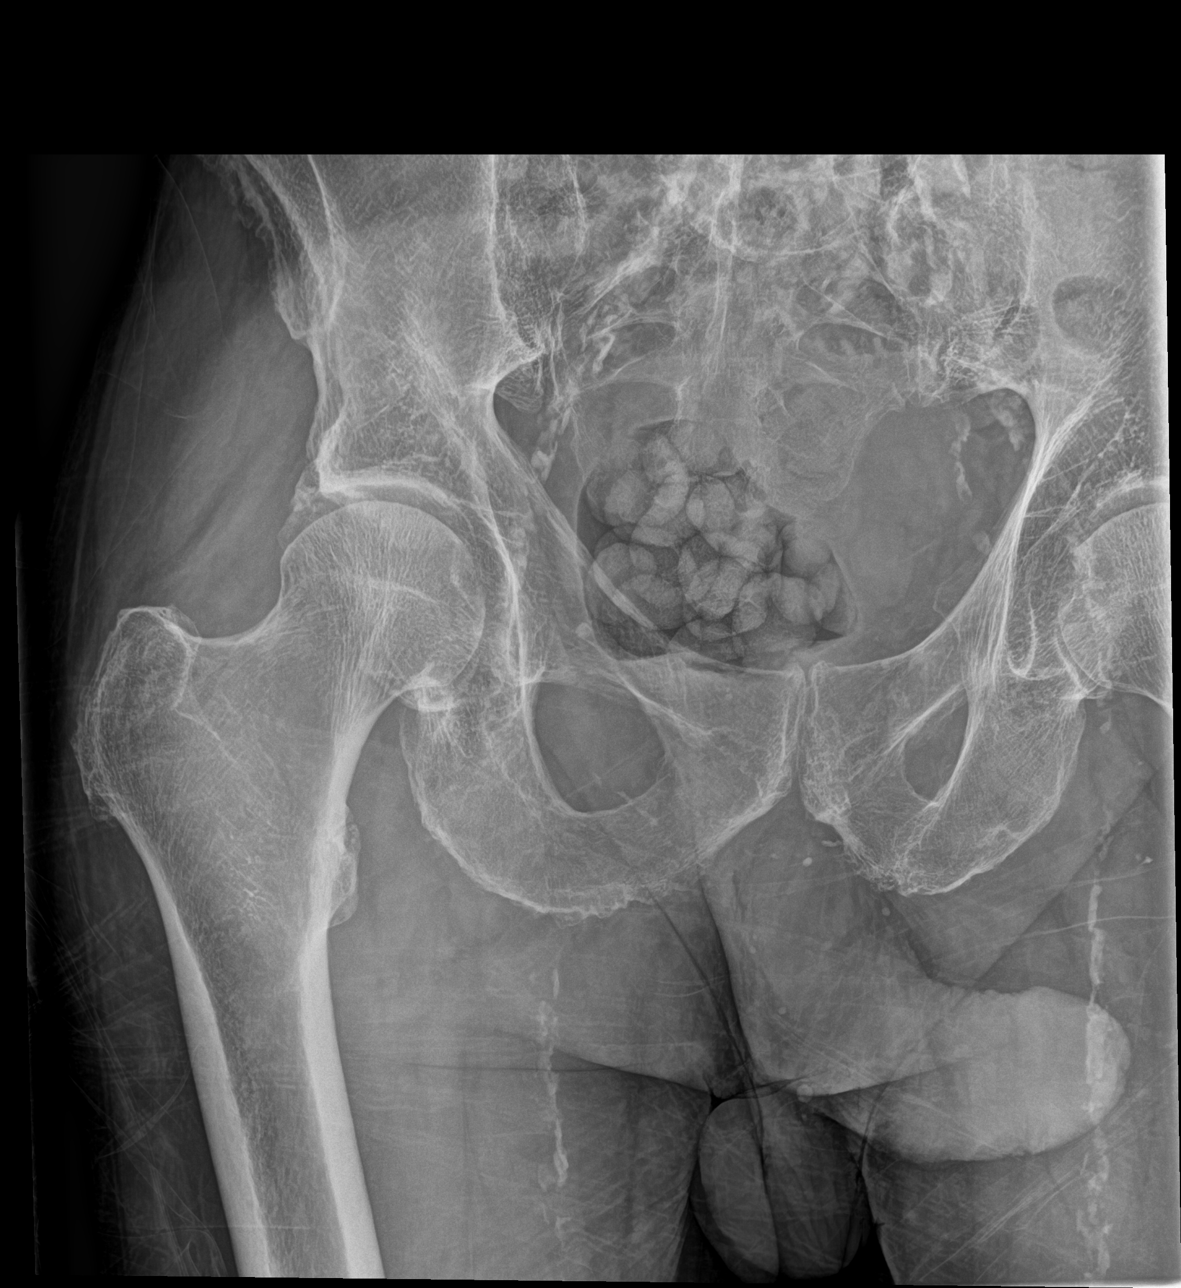

[hip lat]
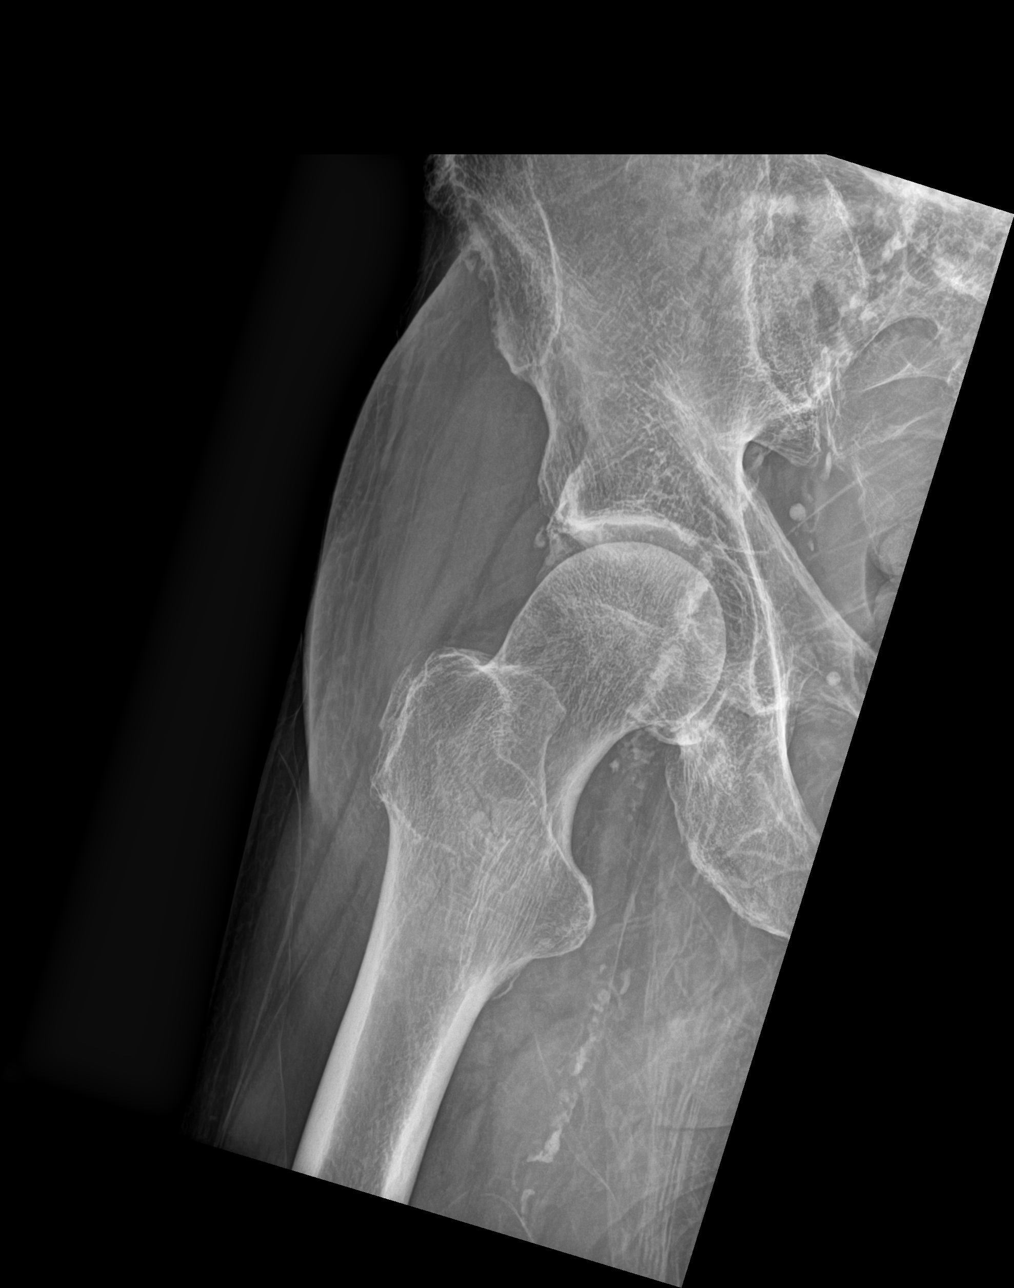

[3 of 3 positions shown; findings below may reference images not displayed]

FINDINGS: Extensive vascular calcifications. SI joints are non widened. Pubic
symphysis and rami are intact. Questionable fracture lucency at the
intertrochanteric right femur.
IMPRESSION: Questionable subtle fracture lucency at the intertrochanteric right
femur. Suggest CT for further evaluation.

## 2020-09-01 MED ORDER — IBUPROFEN 400 MG PO TABS
400.0000 mg | ORAL_TABLET | Freq: Once | ORAL | Status: AC | PRN
  Administered 2020-09-01: 400 mg via ORAL
  Filled 2020-09-01: qty 1

## 2020-09-01 NOTE — ED Notes (Signed)
Patient transported to XRAY at this time.

## 2020-09-01 NOTE — ED Notes (Signed)
Patient Returned from [employer]

## 2020-09-01 NOTE — ED Triage Notes (Signed)
Pt Here POV from Home with a Fall  Pt was at Home when he fell while playing Tennis with friends approx. At [place].  Patient complaining of pain at R. Upper Thigh.  Patient takes 81 mg ASA. No other Blood Thinners.   Patient non-ambulatory at Baseline. No known injury to head but unsure.

## 2020-09-01 NOTE — ED Provider Notes (Addendum)
[facility] EMERGENCY DEPT Provider Note   CSN: [id number] Arrival date & time: 09/01/20  2253     History Chief Complaint  Patient presents with  . Fall    R. Leg Pain    Jesus Jordan is a 75 y.o. male.  Patient is a 75 year old male with past medical history of coronary artery disease with stent, COPD, hypertension, and what daughter describes as "memory issues".  Patient presenting today for evaluation of a fall.  From what I am told he was playing tennis with friends earlier today.  He was running in an attempt to return to a ball when he twisted awkwardly, fell, and injured his right leg.  He is describing pain in his right hip.  He was unable to ambulate afterward and his friends carried him to the car and to his home.  His daughter brings him tonight due to the pain and inability to ambulate.  Patient denies any weakness, numbness, or tingling of the leg.  He describes pain with any range of motion.  The history is provided by the patient.  Fall This is a new problem. The current episode started 3 to 5 hours ago. The problem occurs constantly. Exacerbated by: Movement, position, and palpation. Nothing relieves the symptoms.       Past Medical History:  Diagnosis Date  . Alcohol abuse, daily use    05/29/20 - Reported consuming a 6 pack per day  . CAD in native artery 05/03/2016  . COPD (chronic obstructive pulmonary disease) 12/10/2018  . Coronary atherosclerosis 01/29/2009  . Essential hypertension 12/10/2018  . GERD (gastroesophageal reflux disease) 04/18/2018  . Gout   . Hearing loss   . History of MI (myocardial infarction) 09/12/2008   large inferolateral MI secondary to total occlusion of circumflex with VF cardiac arrest  . History of nuclear stress test 02/23/2012   exercise myoview; area of scar in inferolateral wall at mid-basal region  . Hyperlipidemia 03/06/2013   target LDL less than 70  . Inguinal hernia 05/25/2009  . Major neurocognitive  disorder due to multiple etiologies without behavioral disturbance 05/29/2020    Patient Active Problem List   Diagnosis Date Noted  . Major neurocognitive disorder due to multiple etiologies without behavioral disturbance 05/29/2020  . Alcohol abuse, daily use   . Hearing loss   . COPD (chronic obstructive pulmonary disease) 12/10/2018  . Essential hypertension 12/10/2018  . Chest pain 12/09/2018  . GERD (gastroesophageal reflux disease) 04/18/2018  . CAD in native artery 05/03/2016  . Hyperlipidemia with target LDL less than 70 03/06/2013  . Inguinal hernia 05/25/2009  . Gout 01/29/2009  . Coronary atherosclerosis 01/29/2009  . History of MI (myocardial infarction) 09/12/2008    Past Surgical History:  Procedure Laterality Date  . CORONARY STENT PLACEMENT  09/12/2008   r/t MI; 3.5x49mm Xience DES to circumflex & PTCA at multiple sites in distal region of vessel; alos diffusely diseaase, narrowed, nondominant RCA  . FOOT SURGERY  1960  . HERNIA REPAIR  2011   left inguinal hernia  . TRANSTHORACIC ECHOCARDIOGRAM  03/02/73   YO=37-85%; LV systolic function borderline reduced with mild inferior wall hypokinesis; mild-mod MR; mild TR; AV mildly sclerotic; mild aortic root dilatation       Family History  Problem Relation Age of Onset  . Colon cancer Father   . Alcoholism Father   . Hyperlipidemia Brother   . Heart disease Brother 21       massive MI  . Raynaud syndrome  Brother   . Cancer Paternal Grandfather        colon  . Dementia Sister        Unspecified type    Social History   Tobacco Use  . Smoking status: Current Some Day Smoker    Years: 30.00    Types: Cigarettes  . Smokeless tobacco: Never Used  . Tobacco comment: skip days some; pack last 3 days   Vaping Use  . Vaping Use: Never used  Substance Use Topics  . Alcohol use: Yes    Alcohol/week: 42.0 standard drinks    Types: 42 Cans of beer per week    Comment: 6 pack per day  . Drug use: No     Home Medications Prior to Admission medications   Medication Sig Start Date End Date Taking? Authorizing Provider  allopurinol (ZYLOPRIM) 300 MG tablet Take 1 tablet (300 mg total) by mouth daily. 06/05/20   Burchette, Alinda Sierras, MD  aspirin 81 MG tablet Take 81 mg by mouth at bedtime.     [provider]  calcium carbonate (TUMS - DOSED IN MG ELEMENTAL CALCIUM) 500 MG chewable tablet Chew 1-2 tablets by mouth as needed for indigestion or heartburn.    [provider]  desoximetasone (TOPICORT) 0.25 % cream APPLY TWICE A DAY AS DIRECTED 05/11/20   Burchette, Alinda Sierras, MD  donepezil (ARICEPT) 10 MG tablet Take 1 tablet (10 mg total) by mouth at bedtime. 03/06/20   Burchette, Alinda Sierras, MD  ezetimibe (ZETIA) 10 MG tablet Take 1 tablet by mouth once daily Patient taking differently: Take 10 mg by mouth at bedtime. 08/19/19   Almyra Deforest, PA  fluticasone (CUTIVATE) 0.05 % cream Apply 1 application topically daily as needed (to affected areas of face).  02/12/13   [provider]  metoprolol succinate (TOPROL-XL) 25 MG 24 hr tablet Take 1 tablet (25 mg total) by mouth daily. 05/18/20   Burchette, Alinda Sierras, MD  Milk Thistle 1000 MG CAPS Take 1 capsule by mouth daily.    [provider]  mirtazapine (REMERON) 15 MG tablet Take 15 mg by mouth at bedtime. 07/07/20   [provider]  nitroGLYCERIN (NITROSTAT) 0.4 MG SL tablet DISSOLVE ONE TABLET UNDER THE TONGUE EVERY 5 MINUTES AS NEEDED FOR CHEST PAIN.  DO NOT EXCEED A TOTAL OF 3 DOSES IN 15 MINUTES...CALL 911 08/30/19   Troy Sine, MD  pantoprazole (PROTONIX) 40 MG tablet Take 1 tablet (40 mg total) by mouth daily as needed. 12/10/18   Rama, Venetia Maxon, MD  simvastatin (ZOCOR) 40 MG tablet TAKE 1 TABLET BY MOUTH AT BEDTIME 11/26/19   Troy Sine, MD  tetrahydrozoline-zinc (VISINE-AC) 0.05-0.25 % ophthalmic solution Place 2 drops into both eyes 3 (three) times daily as needed (for itching ot redness).     [provider]    Allergies    Imdur [isosorbide nitrate] and Lisinopril  Review of Systems   Review of Systems  All other systems reviewed and are negative.   Physical Exam Updated Vital Signs BP (!) 169/88 (BP Location: Left Arm)   Pulse 93   Temp 98.2 F (36.8 C) (Oral)   Resp 20   Ht 5\' 10"  (1.778 m)   Wt 79.4 kg   SpO2 99%   BMI 25.11 kg/m   Physical Exam Vitals and nursing note reviewed.  Constitutional:      General: He is not in acute distress.    Appearance: He is well-developed. He is  not diaphoretic.  HENT:     Head: Normocephalic and atraumatic.  Cardiovascular:     Rate and Rhythm: Normal rate and regular rhythm.     Heart sounds: No murmur heard. No friction rub.  Pulmonary:     Effort: Pulmonary effort is normal. No respiratory distress.     Breath sounds: Normal breath sounds. No wheezing or rales.  Abdominal:     General: Bowel sounds are normal. There is no distension.     Palpations: Abdomen is soft.     Tenderness: There is no abdominal tenderness.  Musculoskeletal:        General: Normal range of motion.     Cervical back: Normal range of motion and neck supple.     Comments: The right hip appears grossly normal, however there is tenderness to palpation over the lateral aspect.  He has significant discomfort with any range of motion.  DP and PT pulses are palpable and motor and sensation are intact throughout the leg.  Skin:    General: Skin is warm and dry.  Neurological:     Mental Status: He is alert and oriented to person, place, and time.     Coordination: Coordination normal.     ED Results / Procedures / Treatments   Labs (all labs ordered are listed, but only abnormal results are displayed) Labs Reviewed - No data to display  EKG EKG Interpretation  Date/Time:  Wednesday September 02 2020 02:35:30 EDT Ventricular Rate:  78 PR Interval:    QRS Duration: 100 QT Interval:  392 QTC Calculation: 447 R Axis:   53 Text  Interpretation: Sinus rhythm Low voltage, precordial leads Confirmed by Veryl Speak [phone]) on 09/02/2020 3:32:29 AM   Radiology No results found.  Procedures Procedures   Medications Ordered in ED Medications  ibuprofen (ADVIL) tablet 400 mg (has no administration in time range)    ED Course  I have reviewed the triage vital signs and the nursing notes.  Pertinent labs & imaging results that were available during my care of the patient were reviewed by me and considered in my medical decision making (see chart for details).    MDM Rules/Calculators/A&P  Patient brought to the emergency department after a fall while playing tennis.  Patient hurt his right hip and has been unable to ambulate.  Patient's initial x-rays were suggestive of a trochanteric fracture, but recommended CT scan for further evaluation.  This also was highly suggestive of a trochanteric fracture, but are recommending an MRI for confirmation.  Patient's physical exam is highly consistent with the above radiographic findings and I am almost certain this hip is broken.  I have discussed these findings with Dr. Rolena Infante and patient will be admitted to the hospitalist service under the care of Dr. Cyd Silence.  Final Clinical Impression(s) / ED Diagnoses Final diagnoses:  None    Rx / DC Orders ED Discharge Orders    None       Veryl Speak, MD 09/02/20 Natasha Mead    Veryl Speak, MD 09/02/20 [phone]

## 2020-09-01 NOTE — Patient Instructions (Signed)
Carpal Tunnel Syndrome  Carpal tunnel syndrome is a condition that causes pain, numbness, and weakness in your hand and fingers. The carpal tunnel is a narrow area located on the palm side of your wrist. Repeated wrist motion or certain diseases may cause swelling within the tunnel. This swelling pinches the main nerve in the wrist. The main nerve in the wrist is called the median nerve. What are the causes? This condition may be caused by:  Repeated and forceful wrist and hand motions.  Wrist injuries.  Arthritis.  A cyst or tumor in the carpal tunnel.  Fluid buildup during pregnancy.  Use of tools that vibrate. Sometimes the cause of this condition is not known. What increases the risk? The following factors may make you more likely to develop this condition:  Having a job that requires you to repeatedly or forcefully move your wrist or hand or requires you to use tools that vibrate. This may include jobs that involve using computers, working on an [employer], or working with [place] such as Pension scheme manager.  Being a woman.  Having certain conditions, such as: ? Diabetes. ? Obesity. ? An underactive thyroid (hypothyroidism). ? Kidney failure. ? Rheumatoid arthritis. What are the signs or symptoms? Symptoms of this condition include:  A tingling feeling in your fingers, especially in your thumb, index, and middle fingers.  Tingling or numbness in your hand.  An aching feeling in your entire arm, especially when your wrist and elbow are bent for a long time.  Wrist pain that goes up your arm to your shoulder.  Pain that goes down into your palm or fingers.  A weak feeling in your hands. You may have trouble grabbing and holding items. Your symptoms may feel worse during the night. How is this diagnosed? This condition is diagnosed with a medical history and physical exam. You may also have tests, including:  Electromyogram (EMG). This test measures electrical  signals sent by your nerves into the muscles.  Nerve conduction study. This test measures how well electrical signals pass through your nerves.  Imaging tests, such as X-rays, ultrasound, and MRI. These tests check for possible causes of your condition. How is this treated? This condition may be treated with:  Lifestyle changes. It is important to stop or change the activity that caused your condition.  Doing exercise and activities to strengthen and stretch your muscles and tendons (physical therapy).  Making lifestyle changes to help with your condition and learning how to do your daily activities safely (occupational therapy).  Medicines for pain and inflammation. This may include medicine that is injected into your wrist.  A wrist splint or brace.  Surgery. Follow these instructions at home: If you have a splint or brace:  Wear the splint or brace as told by your health care provider. Remove it only as told by your health care provider.  Loosen the splint or brace if your fingers tingle, become numb, or turn cold and blue.  Keep the splint or brace clean.  If the splint or brace is not waterproof: ? Do not let it get wet. ? Cover it with a watertight covering when you take a bath or shower. Managing pain, stiffness, and swelling If directed, put ice on the painful area. To do this:  If you have a removeable splint or brace, remove it as told by your health care provider.  Put ice in a plastic bag.  Place a towel between your skin and the bag  or between the splint or brace and the bag.  Leave the ice on for 20 minutes, 2-3 times a day. Do not fall asleep with the cold pack on your skin.  Remove the ice if your skin turns bright red. This is very important. If you cannot feel pain, heat, or cold, you have a greater risk of damage to the area. Move your fingers often to reduce stiffness and swelling.   General instructions  Take over-the-counter and prescription  medicines only as told by your health care provider.  Rest your wrist and hand from any activity that may be causing your pain. If your condition is work related, talk with your employer about changes that can be made, such as getting a wrist pad to use while typing.  Do any exercises as told by your health care provider, physical therapist, or occupational therapist.  Keep all follow-up visits. This is important. Contact a health care provider if:  You have new symptoms.  Your pain is not controlled with medicines.  Your symptoms get worse. Get help right away if:  You have severe numbness or tingling in your wrist or hand. Summary  Carpal tunnel syndrome is a condition that causes pain, numbness, and weakness in your hand and fingers.  It is usually caused by repeated wrist motions.  Lifestyle changes and medicines are used to treat carpal tunnel syndrome. Surgery may be recommended.  Follow your health care provider's instructions about wearing a splint, resting from activity, keeping follow-up visits, and calling for help. This information is not intended to replace advice given to you by your health care provider. Make sure you discuss any questions you have with your health care provider. Document Revised: 10/17/2019 Document Reviewed: 10/17/2019 Elsevier Patient Education  2021 [place].  GET A RIGHT WRIST SPLINT FROM PHARMACY OR OTHER MEDICAL SUPPLIER AND WEAR AT NIGHT AND AS MUCH AS POSSIBLE IN THE DAY  SET UP 3 WEEK FOLLOW UP.

## 2020-09-01 NOTE — Progress Notes (Signed)
Established Patient Office Visit  Subjective:  Patient ID: Jesus Jordan, male    DOB: [birth year]  Age: 75 y.o. MRN: [id number]  CC:  Chief Complaint  Patient presents with  . fingers numb    HPI Bryley Kovacevic presents for right hand paresthesias and pain.  He states has been a month or so- possibly longer.  He does have some cognitive impairment so history is not completely reliable.  He is right-hand dominant.  Denies any neck pain.  No radiculitis symptoms.  He notices some numbness and pain involving the thumb, index, middle, and half of the ring finger.  He has sparing of the dorsal involvement of the fingers.  Denies any history of carpal tunnel syndrome.  Does not do a lot of repetitive stuff with his hands.  No left hand symptoms.  Past Medical History:  Diagnosis Date  . Alcohol abuse, daily use    05/29/20 - Reported consuming a 6 pack per day  . CAD in native artery 05/03/2016  . COPD (chronic obstructive pulmonary disease) 12/10/2018  . Coronary atherosclerosis 01/29/2009  . Essential hypertension 12/10/2018  . GERD (gastroesophageal reflux disease) 04/18/2018  . Gout   . Hearing loss   . History of MI (myocardial infarction) 09/12/2008   large inferolateral MI secondary to total occlusion of circumflex with VF cardiac arrest  . History of nuclear stress test 02/23/2012   exercise myoview; area of scar in inferolateral wall at mid-basal region  . Hyperlipidemia 03/06/2013   target LDL less than 70  . Inguinal hernia 05/25/2009  . Major neurocognitive disorder due to multiple etiologies without behavioral disturbance 05/29/2020    Past Surgical History:  Procedure Laterality Date  . CORONARY STENT PLACEMENT  09/12/2008   r/t MI; 3.5x84mm Xience DES to circumflex & PTCA at multiple sites in distal region of vessel; alos diffusely diseaase, narrowed, nondominant RCA  . FOOT SURGERY  1960  . HERNIA REPAIR  2011   left inguinal hernia  . TRANSTHORACIC ECHOCARDIOGRAM   03/02/2009   IE=33-29%; LV systolic function borderline reduced with mild inferior wall hypokinesis; mild-mod MR; mild TR; AV mildly sclerotic; mild aortic root dilatation    Family History  Problem Relation Age of Onset  . Colon cancer Father   . Alcoholism Father   . Hyperlipidemia Brother   . Heart disease Brother 33       massive MI  . Raynaud syndrome Brother   . Cancer Paternal Grandfather        colon  . Dementia Sister        Unspecified type    Social History   Socioeconomic History  . Marital status: Single    Spouse name: Not on file  . Number of children: 3  . Years of education: 50  . Highest education level: High school graduate  Occupational History  . Occupation: Retired    Comment: Development worker, international aid  Tobacco Use  . Smoking status: Current Some Day Smoker    Years: 30.00    Types: Cigarettes  . Smokeless tobacco: Never Used  . Tobacco comment: skip days some; pack last 3 days   Vaping Use  . Vaping Use: Never used  Substance and Sexual Activity  . Alcohol use: Yes    Alcohol/week: 42.0 standard drinks    Types: 42 Cans of beer per week    Comment: 6 pack per day  . Drug use: No  . Sexual activity: Not on file  Other Topics Concern  .  Not on file  Social History Narrative  . Not on file   Social Determinants of Health   Financial Resource Strain: Low Risk   . Difficulty of Paying Living Expenses: Not very hard  Food Insecurity: Not on file  Transportation Needs: No Transportation Needs  . Lack of Transportation (Medical): No  . Lack of Transportation (Non-Medical): No  Physical Activity: Not on file  Stress: Not on file  Social Connections: Not on file  Intimate Partner Violence: Not on file    Outpatient Medications Prior to Visit  Medication Sig Dispense Refill  . allopurinol (ZYLOPRIM) 300 MG tablet Take 1 tablet (300 mg total) by mouth daily. 90 tablet 0  . aspirin 81 MG tablet Take 81 mg by mouth at bedtime.     . calcium carbonate  (TUMS - DOSED IN MG ELEMENTAL CALCIUM) 500 MG chewable tablet Chew 1-2 tablets by mouth as needed for indigestion or heartburn.    . desoximetasone (TOPICORT) 0.25 % cream APPLY TWICE A DAY AS DIRECTED 15 g 0  . donepezil (ARICEPT) 10 MG tablet Take 1 tablet (10 mg total) by mouth at bedtime. 90 tablet 3  . ezetimibe (ZETIA) 10 MG tablet Take 1 tablet by mouth once daily (Patient taking differently: Take 10 mg by mouth at bedtime.) 90 tablet 3  . fluticasone (CUTIVATE) 0.05 % cream Apply 1 application topically daily as needed (to affected areas of face).     . metoprolol succinate (TOPROL-XL) 25 MG 24 hr tablet Take 1 tablet (25 mg total) by mouth daily. 90 tablet 3  . Milk Thistle 1000 MG CAPS Take 1 capsule by mouth daily.    . mirtazapine (REMERON) 15 MG tablet Take 15 mg by mouth at bedtime.    . nitroGLYCERIN (NITROSTAT) 0.4 MG SL tablet DISSOLVE ONE TABLET UNDER THE TONGUE EVERY 5 MINUTES AS NEEDED FOR CHEST PAIN.  DO NOT EXCEED A TOTAL OF 3 DOSES IN 15 MINUTES...CALL 911 25 tablet 3  . pantoprazole (PROTONIX) 40 MG tablet Take 1 tablet (40 mg total) by mouth daily as needed. 30 tablet 4  . simvastatin (ZOCOR) 40 MG tablet TAKE 1 TABLET BY MOUTH AT BEDTIME 90 tablet 3  . tetrahydrozoline-zinc (VISINE-AC) 0.05-0.25 % ophthalmic solution Place 2 drops into both eyes 3 (three) times daily as needed (for itching ot redness).      No facility-administered medications prior to visit.    Allergies  Allergen Reactions  . Imdur [Isosorbide Nitrate] Other (See Comments)    Caused vertigo  . Lisinopril Other (See Comments)    Possible vertigo??    ROS Review of Systems  Constitutional: Negative for chills and fever.  Neurological: Positive for weakness and numbness.       See HPI      Objective:    Physical Exam Vitals reviewed.  Constitutional:      Appearance: Normal appearance.  Cardiovascular:     Rate and Rhythm: Normal rate and regular rhythm.  Pulmonary:     Effort:  Pulmonary effort is normal.  Musculoskeletal:     Comments: No muscle atrophy involving the hands or upper extremities.  Neurological:     Mental Status: He is alert.     Comments: He has sensory impairment with monofilament testing in a medial nerve distribution on the volar surface of the thumb, index, middle, and one half of the ring finger.  Normal sensory function dorsally involving those digits.     BP 120/72   Pulse 67  Temp 98 F (36.7 C) (Oral)   Ht 5\' 8"  (1.727 m)   Wt 146 lb 9 oz (66.5 kg)   SpO2 98%   BMI 22.28 kg/m  Wt Readings from Last 3 Encounters:  09/01/20 146 lb 9 oz (66.5 kg)  07/14/20 144 lb (65.3 kg)  06/17/20 144 lb (65.3 kg)     Health Maintenance Due  Topic Date Due  . TETANUS/TDAP  01/08/2020  . COVID-19 Vaccine (3 - Booster for Pfizer series) 02/26/2020  . COLONOSCOPY (Pts 45-33yrs Insurance coverage will need to be confirmed)  05/06/2020    There are no preventive care reminders to display for this patient.  Lab Results  Component Value Date   TSH 1.40 02/10/2020   Lab Results  Component Value Date   WBC 5.9 02/10/2020   HGB 14.8 02/10/2020   HCT 44.1 02/10/2020   MCV 100.0 02/10/2020   PLT 145 02/10/2020   Lab Results  Component Value Date   NA 139 02/10/2020   K 4.6 02/10/2020   CO2 27 02/10/2020   GLUCOSE 74 02/10/2020   BUN 20 02/10/2020   CREATININE 0.90 02/10/2020   BILITOT 0.8 02/10/2020   ALKPHOS 66 12/09/2018   AST 28 02/10/2020   ALT 21 02/10/2020   PROT 6.9 02/10/2020   ALBUMIN 4.0 12/09/2018   CALCIUM 9.3 02/10/2020   ANIONGAP 10 08/28/2019   GFR 95.90 07/10/2017   Lab Results  Component Value Date   CHOL 140 02/10/2020   Lab Results  Component Value Date   HDL 76 02/10/2020   Lab Results  Component Value Date   LDLCALC 49 02/10/2020   Lab Results  Component Value Date   TRIG 66 02/10/2020   Lab Results  Component Value Date   CHOLHDL 1.8 02/10/2020   Lab Results  Component Value Date    HGBA1C  09/13/2008    5.8 (NOTE)   The ADA recommends the following therapeutic goal for glycemic   control related to Hgb A1C measurement:   Goal of Therapy:   < 7.0% Hgb A1C   Reference: American Diabetes Association: Clinical Practice   Recommendations 2008, Diabetes Care,  2008, 31:(Suppl 1).      Assessment & Plan:   Probable carpal tunnel syndrome right hand.  He is describing some paresthesias and pain involving a classic median nerve distribution.  -We recommended night splint and wear during day as much as possible for the next few weeks.  Follow-up in 3 weeks.  If not improved at that point consider trial of prednisone versus prednisone injection  No orders of the defined types were placed in this encounter.   Follow-up: Return in about 3 weeks (around 09/22/2020).    [name], MD

## 2020-09-02 ENCOUNTER — Emergency Department (HOSPITAL_BASED_OUTPATIENT_CLINIC_OR_DEPARTMENT_OTHER): Payer: Medicare Other

## 2020-09-02 ENCOUNTER — Encounter (HOSPITAL_COMMUNITY): Payer: Self-pay | Admitting: Internal Medicine

## 2020-09-02 ENCOUNTER — Observation Stay (HOSPITAL_COMMUNITY): Payer: Medicare Other

## 2020-09-02 DIAGNOSIS — S72001A Fracture of unspecified part of neck of right femur, initial encounter for closed fracture: Secondary | ICD-10-CM | POA: Diagnosis not present

## 2020-09-02 DIAGNOSIS — F17210 Nicotine dependence, cigarettes, uncomplicated: Secondary | ICD-10-CM | POA: Diagnosis not present

## 2020-09-02 DIAGNOSIS — Z8249 Family history of ischemic heart disease and other diseases of the circulatory system: Secondary | ICD-10-CM | POA: Diagnosis not present

## 2020-09-02 DIAGNOSIS — Z79899 Other long term (current) drug therapy: Secondary | ICD-10-CM | POA: Diagnosis not present

## 2020-09-02 DIAGNOSIS — G928 Other toxic encephalopathy: Secondary | ICD-10-CM | POA: Diagnosis not present

## 2020-09-02 DIAGNOSIS — S72144A Nondisplaced intertrochanteric fracture of right femur, initial encounter for closed fracture: Secondary | ICD-10-CM | POA: Diagnosis not present

## 2020-09-02 DIAGNOSIS — Z811 Family history of alcohol abuse and dependence: Secondary | ICD-10-CM | POA: Diagnosis not present

## 2020-09-02 DIAGNOSIS — Z66 Do not resuscitate: Secondary | ICD-10-CM | POA: Diagnosis not present

## 2020-09-02 DIAGNOSIS — G3183 Dementia with Lewy bodies: Secondary | ICD-10-CM | POA: Diagnosis not present

## 2020-09-02 DIAGNOSIS — I2510 Atherosclerotic heart disease of native coronary artery without angina pectoris: Secondary | ICD-10-CM | POA: Diagnosis not present

## 2020-09-02 DIAGNOSIS — W010XXA Fall on same level from slipping, tripping and stumbling without subsequent striking against object, initial encounter: Secondary | ICD-10-CM | POA: Diagnosis not present

## 2020-09-02 DIAGNOSIS — K219 Gastro-esophageal reflux disease without esophagitis: Secondary | ICD-10-CM | POA: Diagnosis not present

## 2020-09-02 DIAGNOSIS — F10239 Alcohol dependence with withdrawal, unspecified: Secondary | ICD-10-CM | POA: Diagnosis not present

## 2020-09-02 DIAGNOSIS — H9190 Unspecified hearing loss, unspecified ear: Secondary | ICD-10-CM | POA: Diagnosis not present

## 2020-09-02 DIAGNOSIS — E785 Hyperlipidemia, unspecified: Secondary | ICD-10-CM | POA: Diagnosis not present

## 2020-09-02 DIAGNOSIS — J449 Chronic obstructive pulmonary disease, unspecified: Secondary | ICD-10-CM | POA: Diagnosis not present

## 2020-09-02 DIAGNOSIS — M25551 Pain in right hip: Secondary | ICD-10-CM | POA: Diagnosis not present

## 2020-09-02 DIAGNOSIS — M109 Gout, unspecified: Secondary | ICD-10-CM | POA: Diagnosis not present

## 2020-09-02 DIAGNOSIS — Z20822 Contact with and (suspected) exposure to covid-19: Secondary | ICD-10-CM | POA: Diagnosis not present

## 2020-09-02 DIAGNOSIS — Z7982 Long term (current) use of aspirin: Secondary | ICD-10-CM | POA: Diagnosis not present

## 2020-09-02 DIAGNOSIS — I10 Essential (primary) hypertension: Secondary | ICD-10-CM | POA: Diagnosis not present

## 2020-09-02 DIAGNOSIS — Z888 Allergy status to other drugs, medicaments and biological substances status: Secondary | ICD-10-CM | POA: Diagnosis not present

## 2020-09-02 DIAGNOSIS — Z955 Presence of coronary angioplasty implant and graft: Secondary | ICD-10-CM | POA: Diagnosis not present

## 2020-09-02 DIAGNOSIS — I252 Old myocardial infarction: Secondary | ICD-10-CM | POA: Diagnosis not present

## 2020-09-02 DIAGNOSIS — R600 Localized edema: Secondary | ICD-10-CM | POA: Diagnosis not present

## 2020-09-02 DIAGNOSIS — F0280 Dementia in other diseases classified elsewhere without behavioral disturbance: Secondary | ICD-10-CM | POA: Diagnosis not present

## 2020-09-02 DIAGNOSIS — S72101A Unspecified trochanteric fracture of right femur, initial encounter for closed fracture: Secondary | ICD-10-CM | POA: Diagnosis not present

## 2020-09-02 LAB — CBC WITH DIFFERENTIAL/PLATELET
Abs Immature Granulocytes: 0.04 10*3/uL (ref 0.00–0.07)
Basophils Absolute: 0.1 10*3/uL (ref 0.0–0.1)
Basophils Relative: 1 %
Eosinophils Absolute: 0.1 10*3/uL (ref 0.0–0.5)
Eosinophils Relative: 1 %
HCT: 40.1 % (ref 39.0–52.0)
Hemoglobin: 13.5 g/dL (ref 13.0–17.0)
Immature Granulocytes: 0 %
Lymphocytes Relative: 12 %
Lymphs Abs: 1.1 10*3/uL (ref 0.7–4.0)
MCH: 32.6 pg (ref 26.0–34.0)
MCHC: 33.7 g/dL (ref 30.0–36.0)
MCV: 96.9 fL (ref 80.0–100.0)
Monocytes Absolute: 1.1 10*3/uL — ABNORMAL HIGH (ref 0.1–1.0)
Monocytes Relative: 12 %
Neutro Abs: 6.6 10*3/uL (ref 1.7–7.7)
Neutrophils Relative %: 74 %
Platelets: 131 10*3/uL — ABNORMAL LOW (ref 150–400)
RBC: 4.14 MIL/uL — ABNORMAL LOW (ref 4.22–5.81)
RDW: 14 % (ref 11.5–15.5)
WBC: 9 10*3/uL (ref 4.0–10.5)
nRBC: 0 % (ref 0.0–0.2)

## 2020-09-02 LAB — COMPREHENSIVE METABOLIC PANEL
ALT: 20 U/L (ref 0–44)
AST: 28 U/L (ref 15–41)
Albumin: 4.1 g/dL (ref 3.5–5.0)
Alkaline Phosphatase: 64 U/L (ref 38–126)
Anion gap: 9 (ref 5–15)
BUN: 15 mg/dL (ref 8–23)
CO2: 24 mmol/L (ref 22–32)
Calcium: 9.2 mg/dL (ref 8.9–10.3)
Chloride: 104 mmol/L (ref 98–111)
Creatinine, Ser: 0.81 mg/dL (ref 0.61–1.24)
GFR, Estimated: >60 mL/min (ref >60)
Glucose, Bld: 97 mg/dL (ref 70–99)
Potassium: 3.9 mmol/L (ref 3.5–5.1)
Sodium: 137 mmol/L (ref 135–145)
Total Bilirubin: 1.3 mg/dL — ABNORMAL HIGH (ref 0.3–1.2)
Total Protein: 7.1 g/dL (ref 6.5–8.1)

## 2020-09-02 LAB — BASIC METABOLIC PANEL
Anion gap: 7 (ref 5–15)
BUN: 22 mg/dL (ref 8–23)
CO2: 26 mmol/L (ref 22–32)
Calcium: 9.1 mg/dL (ref 8.9–10.3)
Chloride: 103 mmol/L (ref 98–111)
Creatinine, Ser: 0.9 mg/dL (ref 0.61–1.24)
GFR, Estimated: >60 mL/min (ref >60)
Glucose, Bld: 102 mg/dL — ABNORMAL HIGH (ref 70–99)
Potassium: 4.1 mmol/L (ref 3.5–5.1)
Sodium: 136 mmol/L (ref 135–145)

## 2020-09-02 LAB — CBC
HCT: 42.8 % (ref 39.0–52.0)
Hemoglobin: 14.4 g/dL (ref 13.0–17.0)
MCH: 33.3 pg (ref 26.0–34.0)
MCHC: 33.6 g/dL (ref 30.0–36.0)
MCV: 99.1 fL (ref 80.0–100.0)
Platelets: 139 10*3/uL — ABNORMAL LOW (ref 150–400)
RBC: 4.32 MIL/uL (ref 4.22–5.81)
RDW: 14 % (ref 11.5–15.5)
WBC: 8 10*3/uL (ref 4.0–10.5)
nRBC: 0 % (ref 0.0–0.2)

## 2020-09-02 LAB — PROTIME-INR
INR: 1.1 (ref 0.8–1.2)
Prothrombin Time: 13.3 seconds (ref 11.4–15.2)

## 2020-09-02 LAB — RESP PANEL BY RT-PCR (FLU A&B, COVID) ARPGX2
Influenza A by PCR: NEGATIVE
Influenza B by PCR: NEGATIVE
SARS Coronavirus 2 by RT PCR: NEGATIVE

## 2020-09-02 LAB — PHOSPHORUS: Phosphorus: 3 mg/dL (ref 2.5–4.6)

## 2020-09-02 LAB — MAGNESIUM: Magnesium: 2 mg/dL (ref 1.7–2.4)

## 2020-09-02 LAB — VITAMIN D 25 HYDROXY (VIT D DEFICIENCY, FRACTURES): Vit D, 25-Hydroxy: 35.85 ng/mL (ref 30–100)

## 2020-09-02 IMAGING — CT CT HIP*R* W/O CM
1 of 2 series · 15 of 32 positions shown, 19 images · non-contrast
Comparison: Radiograph [DATE]

CLINICAL DATA: Fall with right hip pain

EXAM:
CT OF THE RIGHT HIP WITHOUT CONTRAST
TECHNIQUE: Multidetector CT imaging of the right hip was performed according to
the standard protocol. Multiplanar CT image reconstructions were
also generated.

[Series 4: thin soft · axial · 0.46mm/px · z∈[+774,+937]mm · 15 of 355 slices shown, 19 images]
[im 15/355  soft-tissue]
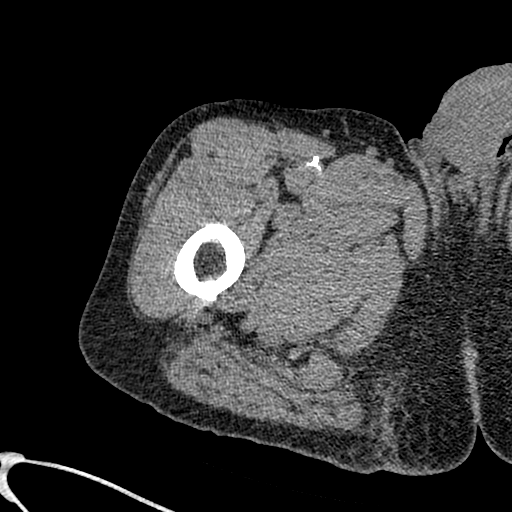
[im 15/355  bone]
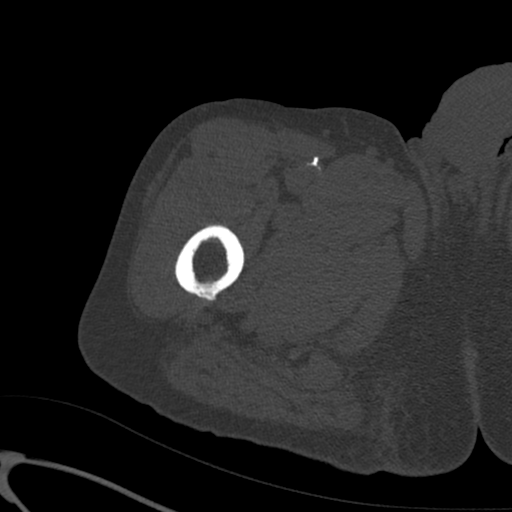
[im 43/355  soft-tissue]
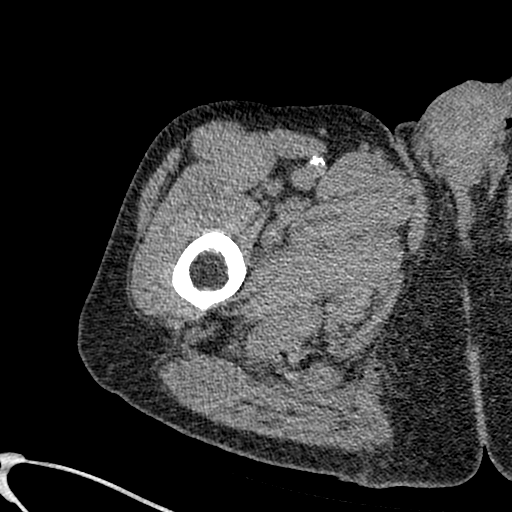
[im 71/355  soft-tissue]
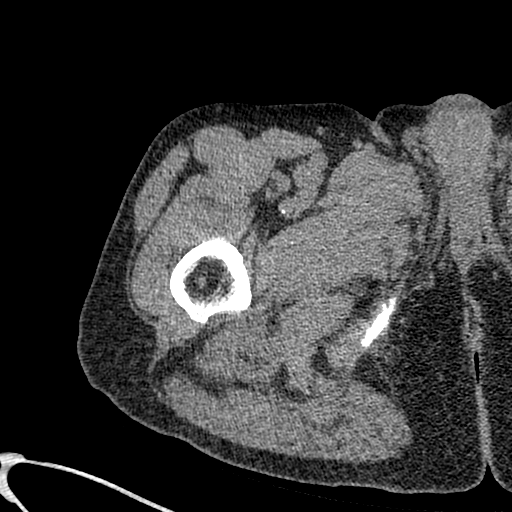
[im 100/355  soft-tissue]
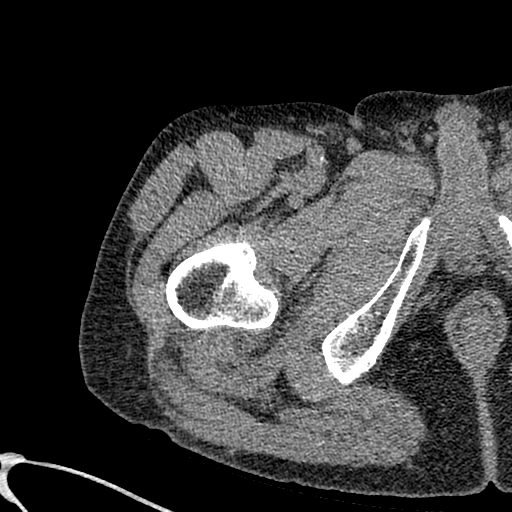
[im 128/355  soft-tissue]
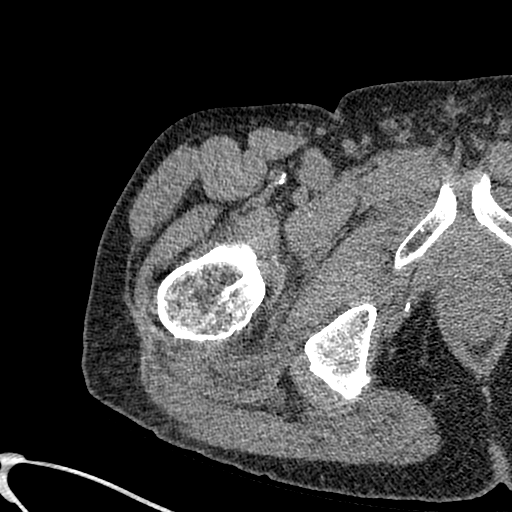
[im 156/355  soft-tissue]
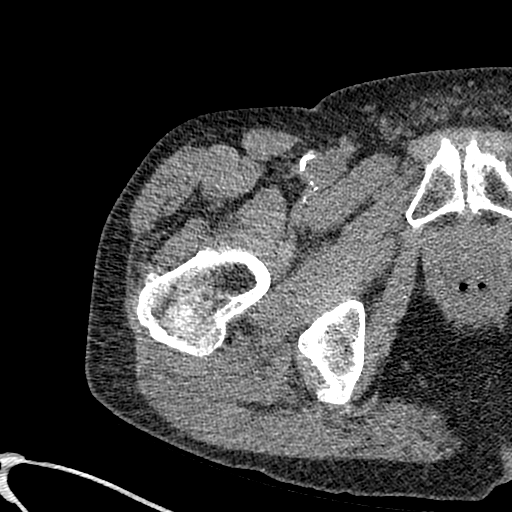
[im 185/355  soft-tissue]
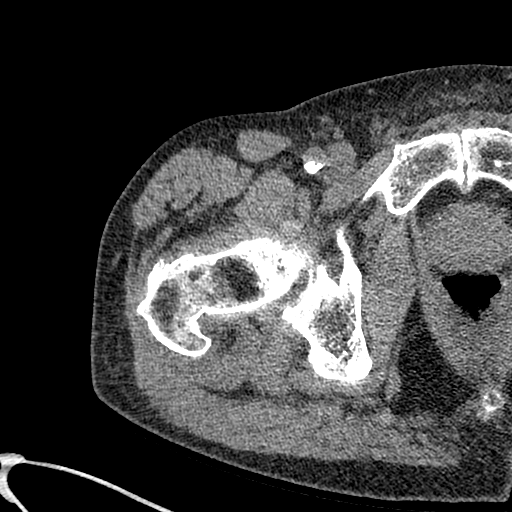
[im 199/355  soft-tissue]
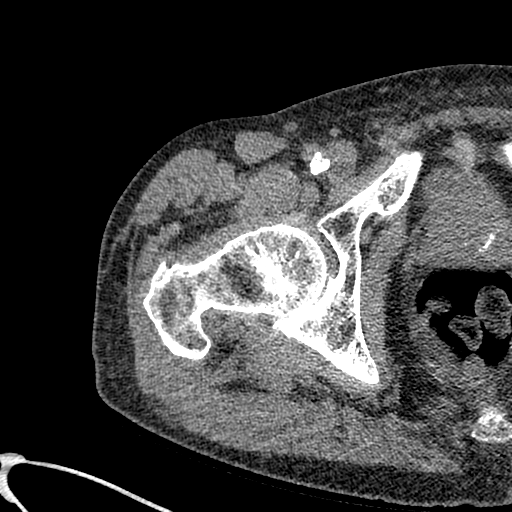
[im 227/355  soft-tissue]
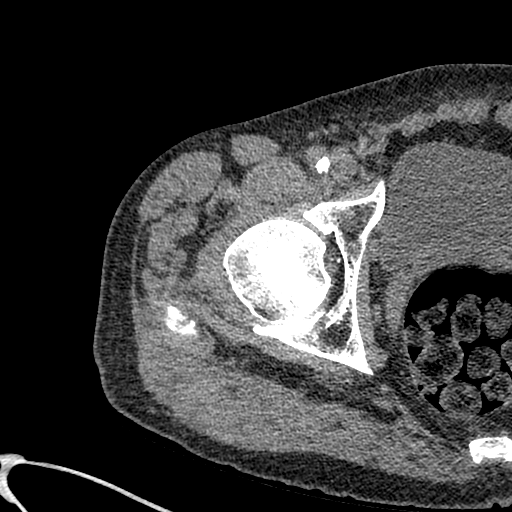
[im 227/355  bone]
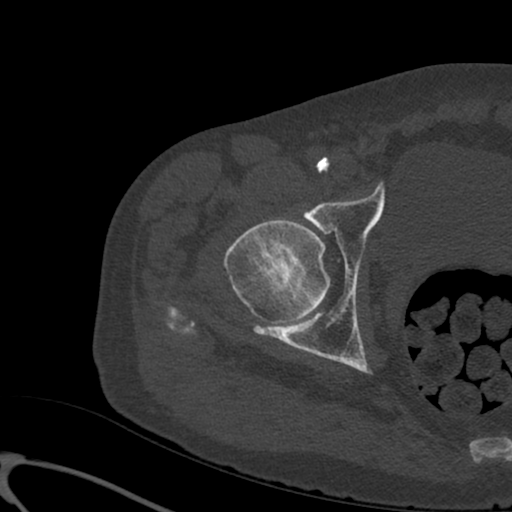
[im 255/355  soft-tissue]
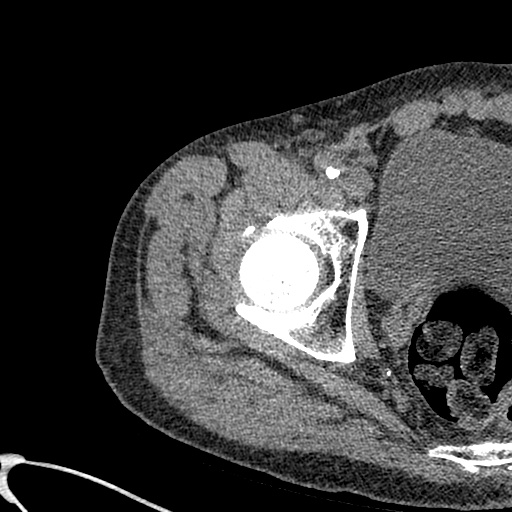
[im 284/355  soft-tissue]
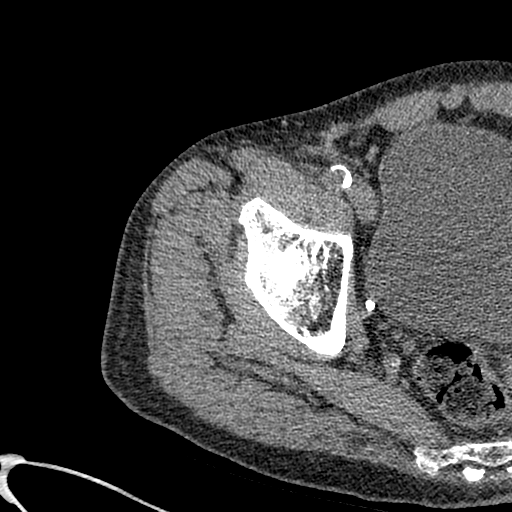
[im 298/355  lung]
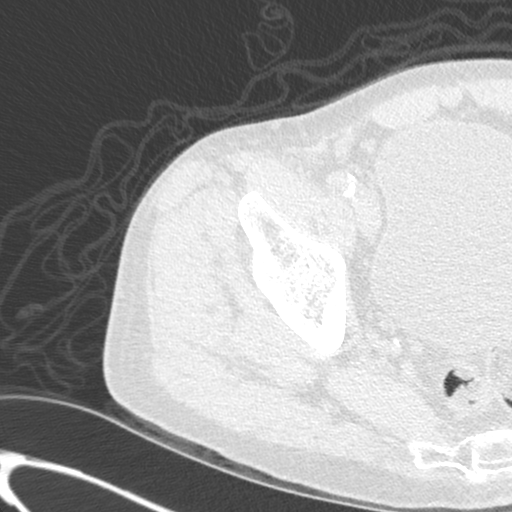
[im 312/355  soft-tissue]
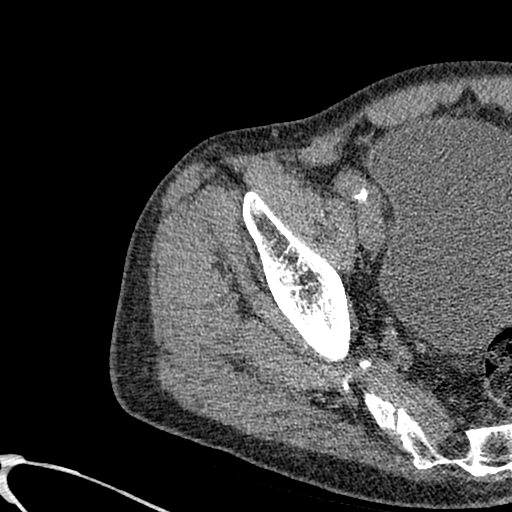
[im 312/355  lung]
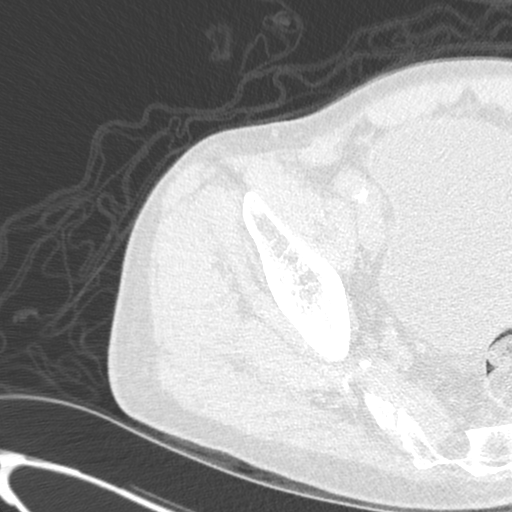
[im 326/355  lung]
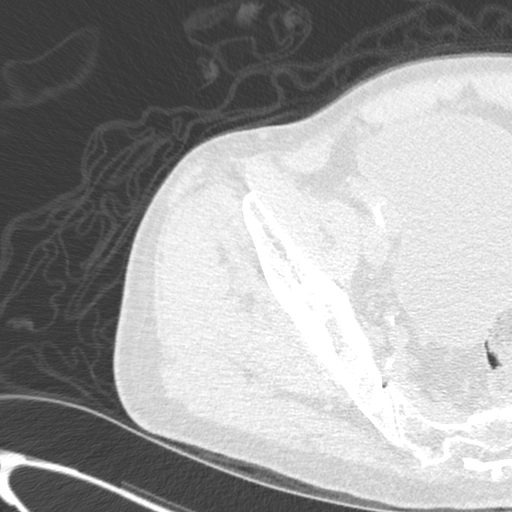
[im 340/355  soft-tissue]
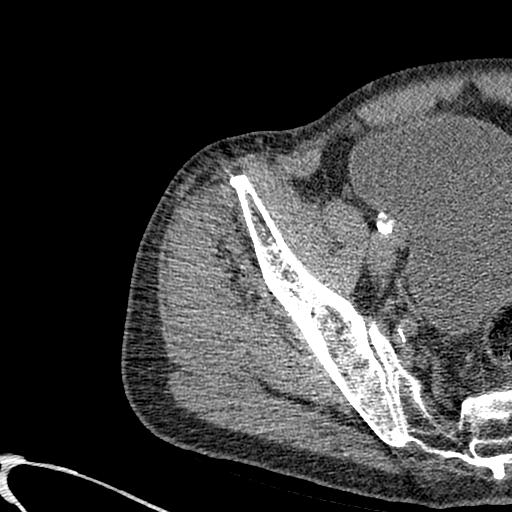
[im 340/355  lung]
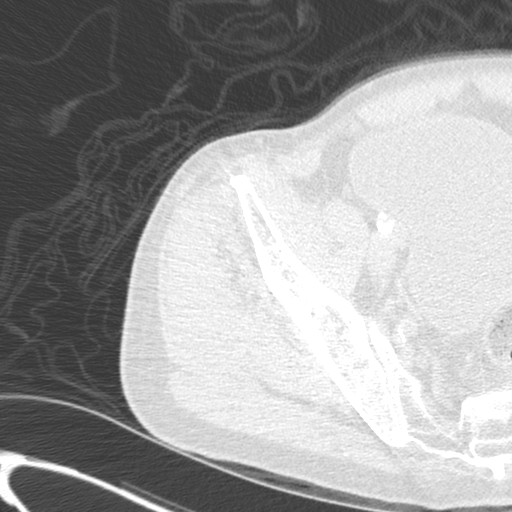

[15 of 32 positions shown; findings below may reference images not displayed]

FINDINGS: Bones/Joint/Cartilage

Best seen on the coronal images is an obliquely oriented irregular
linear lucency that extends from the lesser trochanter towards the
greater trochanter, coronal series 6, image number 49, though
lucency through the greater trochanter is not readily identified.
Right femoral head alignment is normal. There may be faint linear
lucency in cortical step-off at the lower aspect of the greater
trochanter, coronal series 6, image number 33. The right pubic rami
appear intact.

Ligaments

Suboptimally assessed by CT.

Muscles and Tendons

No significant atrophy no intramuscular fluid collections.

Soft tissues

Suspected soft tissue edema about the proximal femur. No large hip
effusion. Vascular calcifications in the groin and pelvis
IMPRESSION: 1. Obliquely oriented irregular linear lucency best seen on coronal
images in the region of lesser trochanter with poorly visible
cranial extent. Findings raise concern for subtle nondisplaced
trochanteric fracture. Correlation with MRI should be considered.

## 2020-09-02 IMAGING — MR MR HIP*R* W/O CM
6 series · 40 of 40 positions shown · non-contrast
Comparison: [DATE]

CLINICAL DATA: Fall, right hip pain, abnormal CT

EXAM:
MR OF THE RIGHT HIP WITHOUT CONTRAST
TECHNIQUE: Multiplanar, multisequence MR imaging was performed. No intravenous
contrast was administered.

[Series 7: T1 · coronal · right · 3.0mm · 0.78mm/px · 8 of 35 slices shown]
[im 1/35]
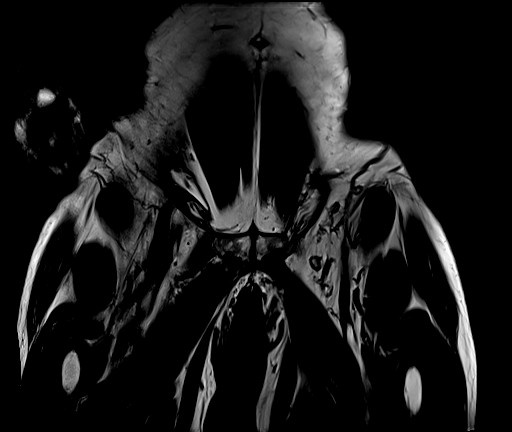
[im 5/35]
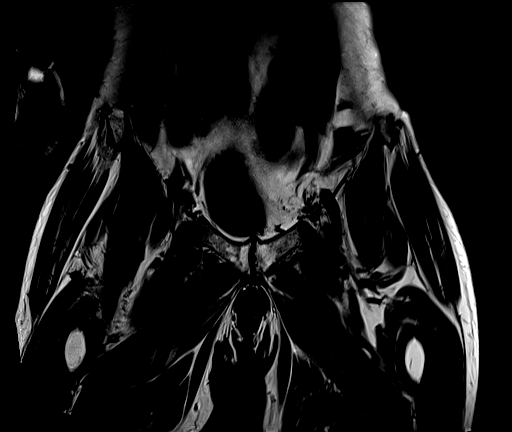
[im 10/35]
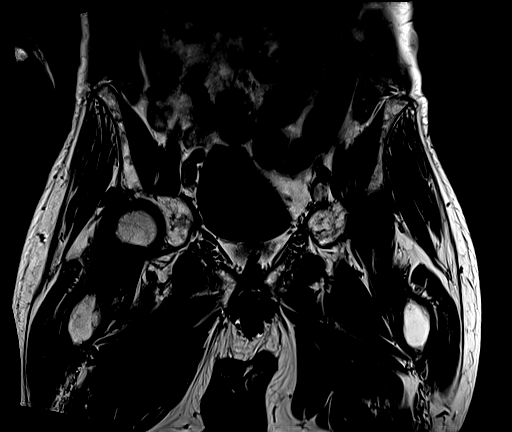
[im 15/35]
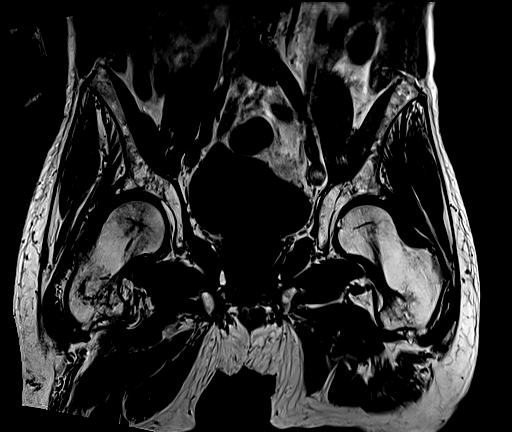
[im 20/35]
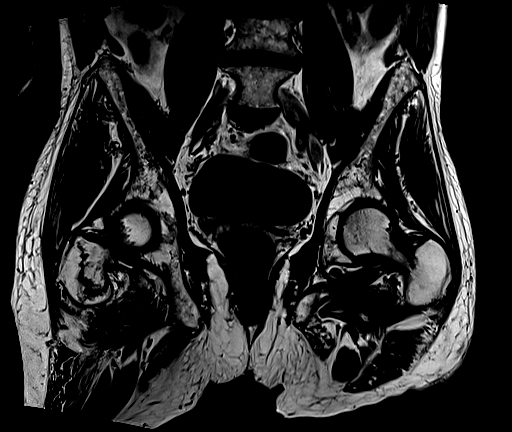
[im 25/35]
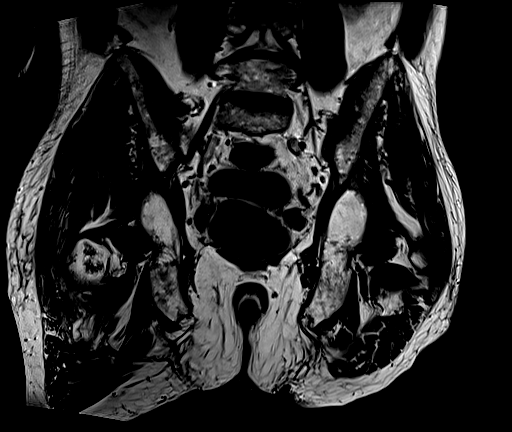
[im 30/35]
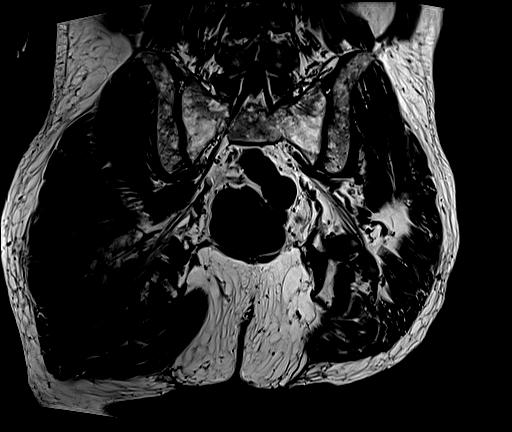
[im 35/35]
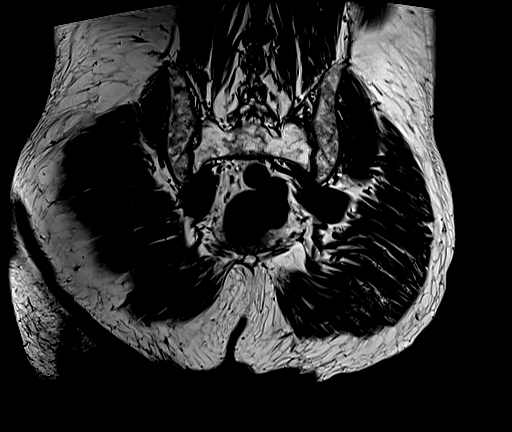

[Series 8: T2 fat-sat · coronal · right · 3.0mm · 1.25mm/px · 8 of 35 slices shown (1 of 2)]
[im 1/35]
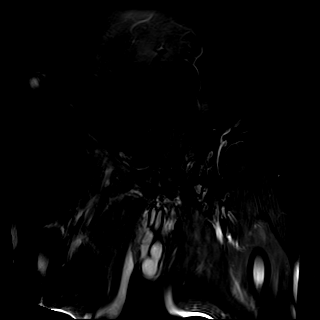
[im 5/35]
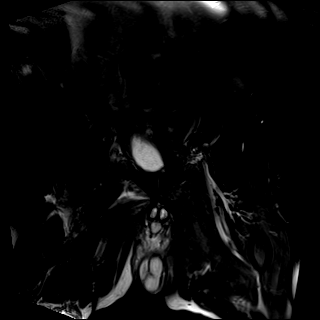
[im 10/35]
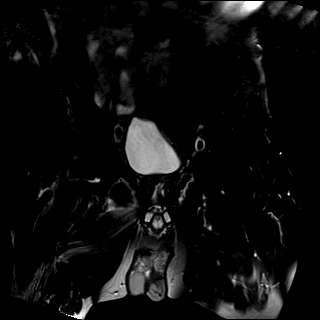
[im 15/35]
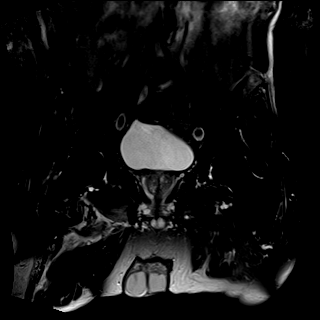
[im 20/35]
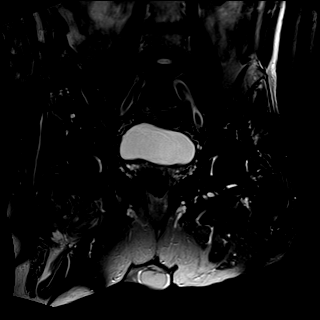
[im 25/35]
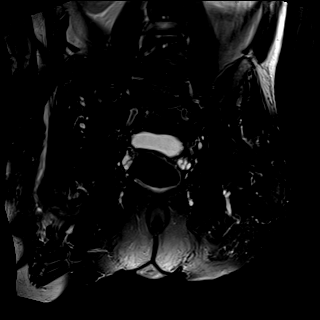
[im 30/35]
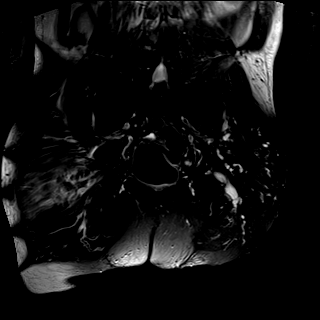
[im 35/35]
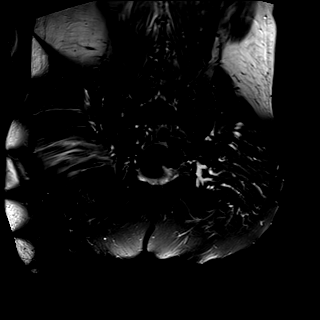

[Series 9: T2 fat-sat · axial · right · 4.0mm · 1.12mm/px · z∈[-93,+99]mm · 8 of 40 slices shown (2 of 2)]
[im 1/40]
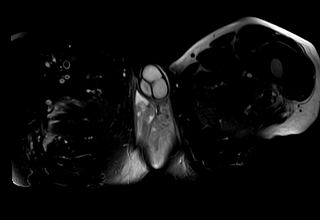
[im 6/40]
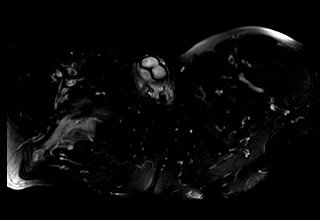
[im 12/40]
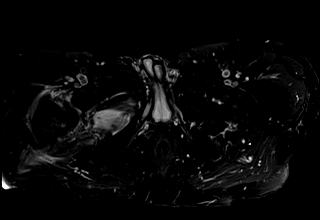
[im 17/40]
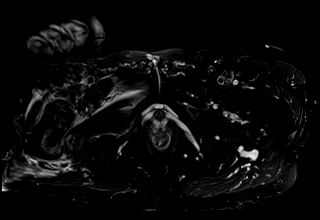
[im 23/40]
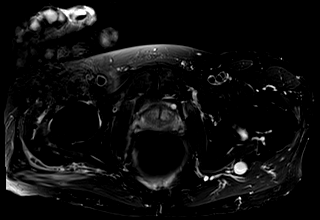
[im 28/40]
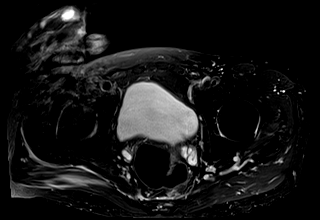
[im 34/40]
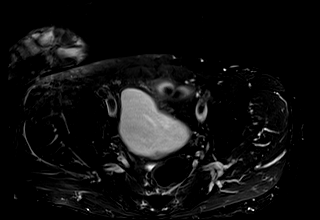
[im 40/40]
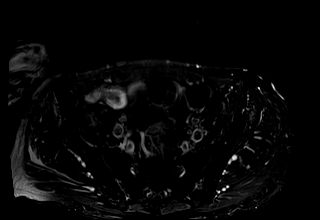

[Series 10: PD · axial · right · 4.0mm · 0.56mm/px · z∈[-95,+98]mm · 8 of 40 slices shown]
[im 1/40]
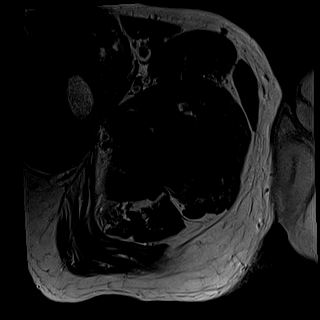
[im 6/40]
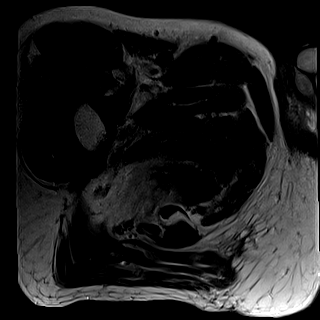
[im 12/40]
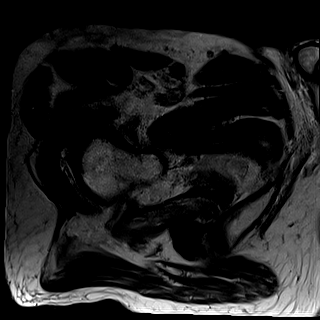
[im 17/40]
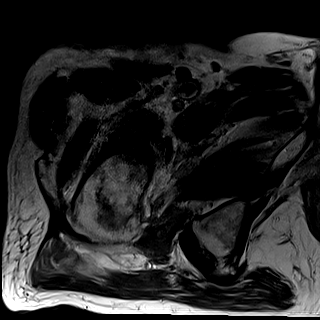
[im 23/40]
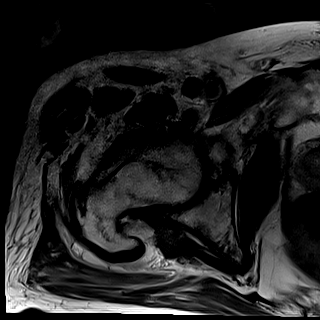
[im 28/40]
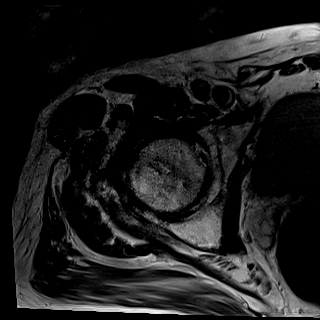
[im 34/40]
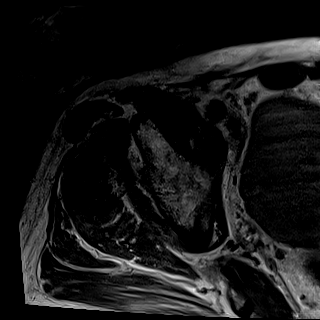
[im 40/40]
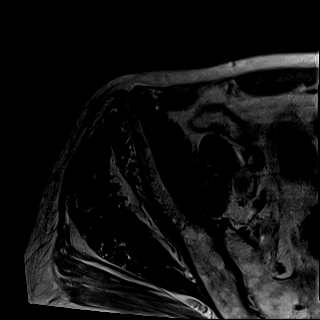

[Series 11: PD fat-sat · coronal · right · 4.0mm · 0.56mm/px · 4 of 20 slices shown (1 of 2)]
[im 1/20]
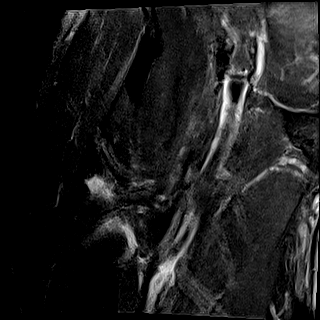
[im 7/20]
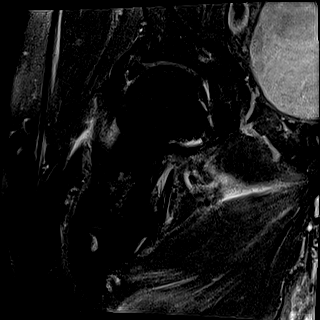
[im 13/20]
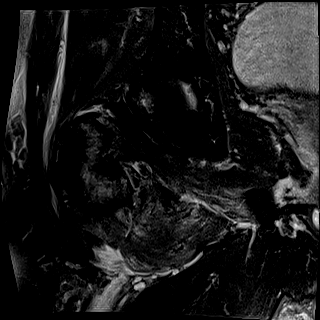
[im 20/20]
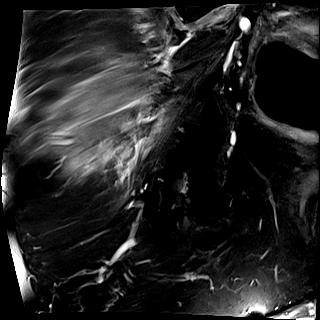

[Series 12: PD fat-sat · sagittal · right · 4.0mm · 0.56mm/px · 4 of 20 slices shown (2 of 2)]
[im 1/20]
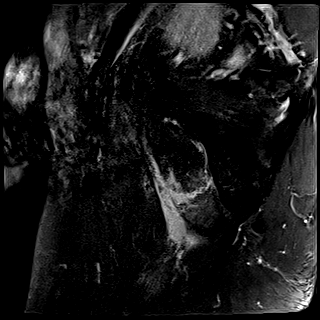
[im 7/20]
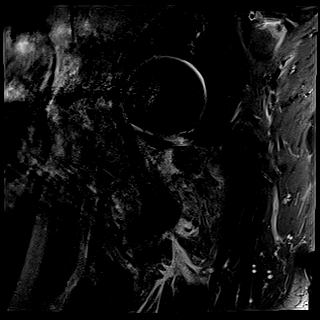
[im 13/20]
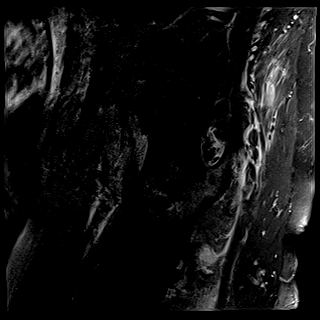
[im 20/20]
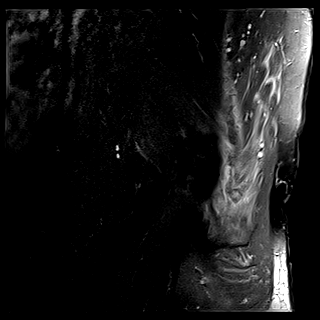

[40 of 40 positions shown; findings below may reference images not displayed]

FINDINGS: Bones: There is linear marrow edema within the intertrochanteric
region of the proximal right femur, compatible with the nondisplaced
fracture seen on recent CT. The subtle cortical discontinuity seen
on CT is not apparent by MR.

Remaining bony structures demonstrate normal signal characteristics.

Articular cartilage and labrum

Articular cartilage:  Mild diffuse articular cartilage thinning.

Labrum:  No gross labral pathology.

Joint or bursal effusion

Joint effusion:  No evidence of joint effusion.

Bursae: Unremarkable.

Muscles and tendons

Muscles and tendons: There is edema throughout the adductor
musculature and quadratus femoris within the proximal right thigh.
Minimal edema within the gluteal musculature.

Other findings

Miscellaneous: Intrapelvic structures are grossly unremarkable.
Normal signal flow voids within the vasculature.
IMPRESSION: 1. Marrow edema within the intertrochanteric region of the proximal
right femur, compatible with nondisplaced fracture as seen on
previous CT.
2. Edema within the adductor musculature, quadratus femoris, and
gluteal musculature as above.

## 2020-09-02 MED ORDER — EZETIMIBE 10 MG PO TABS
10.0000 mg | ORAL_TABLET | Freq: Every day | ORAL | Status: DC
  Administered 2020-09-03 – 2020-09-09 (×8): 10 mg via ORAL
  Filled 2020-09-02 (×8): qty 1

## 2020-09-02 MED ORDER — THIAMINE HCL 100 MG/ML IJ SOLN
100.0000 mg | Freq: Every day | INTRAMUSCULAR | Status: DC
  Administered 2020-09-03: 100 mg via INTRAVENOUS
  Filled 2020-09-02 (×3): qty 2

## 2020-09-02 MED ORDER — METOPROLOL SUCCINATE ER 25 MG PO TB24
25.0000 mg | ORAL_TABLET | Freq: Every day | ORAL | Status: DC
Start: 2020-09-03 — End: 2020-09-10
  Administered 2020-09-04 – 2020-09-10 (×7): 25 mg via ORAL
  Filled 2020-09-02 (×8): qty 1

## 2020-09-02 MED ORDER — THIAMINE HCL 100 MG PO TABS
100.0000 mg | ORAL_TABLET | Freq: Every day | ORAL | Status: DC
  Administered 2020-09-04 – 2020-09-10 (×7): 100 mg via ORAL
  Filled 2020-09-02 (×8): qty 1

## 2020-09-02 MED ORDER — SIMVASTATIN 40 MG PO TABS
40.0000 mg | ORAL_TABLET | Freq: Every day | ORAL | Status: DC
  Administered 2020-09-02 – 2020-09-09 (×7): 40 mg via ORAL
  Filled 2020-09-02 (×7): qty 1

## 2020-09-02 MED ORDER — LORAZEPAM 2 MG/ML IJ SOLN
1.0000 mg | INTRAMUSCULAR | Status: DC | PRN
  Administered 2020-09-02 – 2020-09-03 (×3): 2 mg via INTRAVENOUS
  Filled 2020-09-02 (×3): qty 1

## 2020-09-02 MED ORDER — POLYETHYLENE GLYCOL 3350 17 G PO PACK
17.0000 g | PACK | Freq: Every day | ORAL | Status: DC | PRN
  Administered 2020-09-09: 17 g via ORAL
  Filled 2020-09-02: qty 1

## 2020-09-02 MED ORDER — MORPHINE SULFATE (PF) 4 MG/ML IV SOLN
4.0000 mg | Freq: Once | INTRAVENOUS | Status: AC
  Administered 2020-09-02: 4 mg via INTRAVENOUS
  Filled 2020-09-02: qty 1

## 2020-09-02 MED ORDER — HYDROCODONE-ACETAMINOPHEN 5-325 MG PO TABS
1.0000 | ORAL_TABLET | Freq: Four times a day (QID) | ORAL | Status: DC | PRN
  Administered 2020-09-02 – 2020-09-04 (×2): 2 via ORAL
  Filled 2020-09-02 (×2): qty 2

## 2020-09-02 MED ORDER — ADULT MULTIVITAMIN W/MINERALS CH
1.0000 | ORAL_TABLET | Freq: Every day | ORAL | Status: DC
  Administered 2020-09-02 – 2020-09-10 (×8): 1 via ORAL
  Filled 2020-09-02 (×9): qty 1

## 2020-09-02 MED ORDER — LORAZEPAM 2 MG/ML IJ SOLN
0.5000 mg | Freq: Once | INTRAMUSCULAR | Status: AC
  Administered 2020-09-02: 0.5 mg via INTRAVENOUS
  Filled 2020-09-02: qty 1

## 2020-09-02 MED ORDER — ASPIRIN 81 MG PO CHEW
81.0000 mg | CHEWABLE_TABLET | Freq: Every day | ORAL | Status: DC
  Administered 2020-09-03 – 2020-09-09 (×7): 81 mg via ORAL
  Filled 2020-09-02 (×7): qty 1

## 2020-09-02 MED ORDER — NAPHAZOLINE-GLYCERIN 0.012-0.2 % OP SOLN
1.0000 [drp] | Freq: Four times a day (QID) | OPHTHALMIC | Status: DC | PRN
  Filled 2020-09-02: qty 15

## 2020-09-02 MED ORDER — LORAZEPAM 1 MG PO TABS: 1.0000 mg | ORAL_TABLET | ORAL | Status: DC | PRN

## 2020-09-02 MED ORDER — DONEPEZIL HCL 10 MG PO TABS
10.0000 mg | ORAL_TABLET | Freq: Every day | ORAL | Status: DC
  Administered 2020-09-03 – 2020-09-09 (×8): 10 mg via ORAL
  Filled 2020-09-02 (×8): qty 1

## 2020-09-02 MED ORDER — MORPHINE SULFATE (PF) 4 MG/ML IV SOLN
4.0000 mg | Freq: Once | INTRAVENOUS | Status: AC
Start: 2020-09-02 — End: 2020-09-02
  Administered 2020-09-02: 4 mg via INTRAVENOUS
  Filled 2020-09-02: qty 1

## 2020-09-02 MED ORDER — FOLIC ACID 1 MG PO TABS
1.0000 mg | ORAL_TABLET | Freq: Every day | ORAL | Status: DC
  Administered 2020-09-02 – 2020-09-10 (×8): 1 mg via ORAL
  Filled 2020-09-02 (×9): qty 1

## 2020-09-02 MED ORDER — ENOXAPARIN SODIUM 40 MG/0.4ML ~~LOC~~ SOLN
40.0000 mg | SUBCUTANEOUS | Status: DC
  Administered 2020-09-03: 40 mg via SUBCUTANEOUS
  Filled 2020-09-02: qty 0.4

## 2020-09-02 MED ORDER — MORPHINE SULFATE (PF) 2 MG/ML IV SOLN: 0.5000 mg | INTRAVENOUS | Status: DC | PRN

## 2020-09-02 MED ORDER — MIRTAZAPINE 15 MG PO TABS
15.0000 mg | ORAL_TABLET | Freq: Every day | ORAL | Status: DC
  Administered 2020-09-03 – 2020-09-09 (×8): 15 mg via ORAL
  Filled 2020-09-02 (×8): qty 1

## 2020-09-02 MED ORDER — DOXYCYCLINE HYCLATE 100 MG PO TABS
100.0000 mg | ORAL_TABLET | Freq: Every day | ORAL | Status: DC
  Administered 2020-09-02 – 2020-09-10 (×8): 100 mg via ORAL
  Filled 2020-09-02 (×10): qty 1

## 2020-09-02 MED ORDER — ALLOPURINOL 300 MG PO TABS
300.0000 mg | ORAL_TABLET | Freq: Every day | ORAL | Status: DC
  Administered 2020-09-04 – 2020-09-10 (×7): 300 mg via ORAL
  Filled 2020-09-02 (×8): qty 1

## 2020-09-02 MED ORDER — DOCUSATE SODIUM 100 MG PO CAPS
100.0000 mg | ORAL_CAPSULE | Freq: Two times a day (BID) | ORAL | Status: DC
  Filled 2020-09-02: qty 1

## 2020-09-02 NOTE — H&P (Addendum)
History and Physical    Jesus Jordan UYQ:[id number] DOB: 1945-11-22 DOA: 09/01/2020  PCP: Eulas Post, MD  Patient coming from: Home  Chief Complaint: hip pain  HPI: Jesus Jordan is a 75 y.o. male with medical history significant of EtOH abuse, HTN, GERD, HLD. Presenting with hip pain. History is largely from dtr at bedside. She reports that he was playing tennis with some friends yesterday. He slipped and fell on to his right hip. There was no LOC or head injury. He remembers the entire fall. He was unable to get up under his own power. So his friends assisted him. He got home and had ice placed on his hip, but the pain never improved. His dtr brought him to the ED.    ED Course: He had imaging that was concerning for right hip fracture. Ortho (Dr. Rolena Infante) was consulted. They recommended transfer to [facility] for evaluation. [facility] was called for admission.   Review of Systems:  Denies CP, palpitations, dyspnea, N/V/D, syncopal episodes. Review of systems is otherwise negative for all not mentioned in HPI.   PMHx Past Medical History:  Diagnosis Date   Alcohol abuse, daily use    05/29/20 - Reported consuming a 6 pack per day   CAD in native artery 05/03/2016   COPD (chronic obstructive pulmonary disease) 12/10/2018   Coronary atherosclerosis 01/29/2009   Essential hypertension 12/10/2018   GERD (gastroesophageal reflux disease) 04/18/2018   Gout    Hearing loss    History of MI (myocardial infarction) 09/12/2008   large inferolateral MI secondary to total occlusion of circumflex with VF cardiac arrest   History of nuclear stress test 02/23/2012   exercise myoview; area of scar in inferolateral wall at mid-basal region   Hyperlipidemia 03/06/2013   target LDL less than 70   Inguinal hernia 05/25/2009   Major neurocognitive disorder due to multiple etiologies without behavioral disturbance 05/29/2020    PSHx Past Surgical History:  Procedure Laterality Date   CORONARY  STENT PLACEMENT  09/12/2008   r/t MI; 3.5x14mm Xience DES to circumflex & PTCA at multiple sites in distal region of vessel; alos diffusely diseaase, narrowed, nondominant RCA   [place]  2011   left inguinal hernia   TRANSTHORACIC ECHOCARDIOGRAM  03/02/2009   GL=87-56%; LV systolic function borderline reduced with mild inferior wall hypokinesis; mild-mod MR; mild TR; AV mildly sclerotic; mild aortic root dilatation    SocHx  reports that he has been smoking cigarettes. He has smoked for the past 30.00 years. He has never used smokeless tobacco. He reports current alcohol use of about 42.0 standard drinks of alcohol per week. He reports that he does not use drugs.  Allergies  Allergen Reactions   Imdur [Isosorbide Nitrate] Other (See Comments)    Caused vertigo   Lisinopril Other (See Comments)    Possible vertigo??    FamHx Family History  Problem Relation Age of Onset   Colon cancer Father    Alcoholism Father    Hyperlipidemia Brother    Heart disease Brother 78       massive MI   Raynaud syndrome Brother    Cancer Paternal Grandfather        colon   Dementia Sister        Unspecified type    Prior to Admission medications   Medication Sig Start Date End Date Taking? Authorizing Provider  allopurinol (ZYLOPRIM) 300 MG tablet Take 1 tablet (300 mg total) by mouth  daily. 06/05/20  Yes Burchette, Alinda Sierras, MD  aspirin 81 MG tablet Take 81 mg by mouth at bedtime.    Yes [provider]  donepezil (ARICEPT) 10 MG tablet Take 1 tablet (10 mg total) by mouth at bedtime. 03/06/20  Yes Burchette, Alinda Sierras, MD  doxycycline (VIBRA-TABS) 100 MG tablet Take 100 mg by mouth daily. For rosacea/continuous   Yes [provider]  ezetimibe (ZETIA) 10 MG tablet Take 1 tablet by mouth once daily Patient taking differently: Take 10 mg by mouth at bedtime. 08/19/19  Yes Almyra Deforest, PA  metoprolol succinate (TOPROL-XL) 25 MG 24 hr tablet Take 1  tablet (25 mg total) by mouth daily. 05/18/20  Yes Burchette, Alinda Sierras, MD  mirtazapine (REMERON) 15 MG tablet Take 15 mg by mouth at bedtime. 07/07/20  Yes [provider]  simvastatin (ZOCOR) 40 MG tablet TAKE 1 TABLET BY MOUTH AT BEDTIME Patient taking differently: Take 40 mg by mouth daily at 6 PM. 11/26/19  Yes Troy Sine, MD  tetrahydrozoline-zinc (VISINE-AC) 0.05-0.25 % ophthalmic solution Place 2 drops into both eyes 3 (three) times daily as needed (for itching ot redness).    Yes [provider]  desoximetasone (TOPICORT) 0.25 % cream APPLY TWICE A DAY AS DIRECTED Patient not taking: Reported on 09/02/2020 05/11/20   Eulas Post, MD    Physical Exam: Vitals:   09/02/20 0600 09/02/20 0800 09/02/20 1100 09/02/20 1323  BP: 112/72 122/69 103/70 132/78  Pulse: 62 65 64 67  Resp: 15 14 17 15   Temp:    98.2 F (36.8 C)  TempSrc:    Oral  SpO2: 99% 100% 98% 100%  Weight:      Height:        General: 75 y.o. male resting in bed in NAD Eyes: PERRL, normal sclera ENMT: Nares patent w/o discharge, orophaynx clear, dentition normal, ears w/o discharge/lesions/ulcers Neck: Supple, trachea midline Cardiovascular: RRR, +S1, S2, no m/g/r, equal pulses throughout Respiratory: CTABL, no w/r/r, normal WOB GI: BS+, NDNT, no masses noted, no organomegaly noted MSK: No e/c/c Skin: No rashes, bruises, ulcerations noted Neuro: A&O x 2 (name, year), no focal deficits Psyc: Appropriate interaction and affect, calm/cooperative, some what sluggish on answers to questions  Labs on Admission: I have personally reviewed following labs and imaging studies  CBC: Recent Labs  Lab 09/02/20 0018  WBC 9.0  NEUTROABS 6.6  HGB 13.5  HCT 40.1  MCV 96.9  PLT 671*   Basic Metabolic Panel: Recent Labs  Lab 09/02/20 0018  NA 136  K 4.1  CL 103  CO2 26  GLUCOSE 102*  BUN 22  CREATININE 0.90  CALCIUM 9.1   GFR: Estimated Creatinine Clearance: 75.5 mL/min (by C-G  formula based on SCr of 0.9 mg/dL). Liver Function Tests: No results for input(s): AST, ALT, ALKPHOS, BILITOT, PROT, ALBUMIN in the last 168 hours. No results for input(s): LIPASE, AMYLASE in the last 168 hours. No results for input(s): AMMONIA in the last 168 hours. Coagulation Profile: Recent Labs  Lab 09/02/20 0018  INR 1.1   Cardiac Enzymes: No results for input(s): CKTOTAL, CKMB, CKMBINDEX, TROPONINI in the last 168 hours. BNP (last 3 results) No results for input(s): PROBNP in the last 8760 hours. HbA1C: No results for input(s): HGBA1C in the last 72 hours. CBG: No results for input(s): GLUCAP in the last 168 hours. Lipid Profile: No results for input(s): CHOL, HDL, LDLCALC, TRIG, CHOLHDL, LDLDIRECT in the last 72 hours. Thyroid Function Tests:  No results for input(s): TSH, T4TOTAL, FREET4, T3FREE, THYROIDAB in the last 72 hours. Anemia Panel: No results for input(s): VITAMINB12, FOLATE, FERRITIN, TIBC, IRON, RETICCTPCT in the last 72 hours. Urine analysis: No results found for: COLORURINE, APPEARANCEUR, LABSPEC, PHURINE, GLUCOSEU, HGBUR, BILIRUBINUR, KETONESUR, PROTEINUR, UROBILINOGEN, NITRITE, LEUKOCYTESUR  Radiological Exams on Admission: CT Hip Right Wo Contrast  Result Date: 09/02/2020 CLINICAL DATA:  Fall with right hip pain EXAM: CT OF THE RIGHT HIP WITHOUT CONTRAST TECHNIQUE: Multidetector CT imaging of the right hip was performed according to the standard protocol. Multiplanar CT image reconstructions were also generated. COMPARISON:  Radiograph 09/02/2020 FINDINGS: Bones/Joint/Cartilage Best seen on the coronal images is an obliquely oriented irregular linear lucency that extends from the lesser trochanter towards the greater trochanter, coronal series 6, image number 49, though lucency through the greater trochanter is not readily identified. Right femoral head alignment is normal. There may be faint linear lucency in cortical step-off at the lower aspect of the  greater trochanter, coronal series 6, image number 33. The right pubic rami appear intact. Ligaments Suboptimally assessed by CT. Muscles and Tendons No significant atrophy no intramuscular fluid collections. Soft tissues Suspected soft tissue edema about the proximal femur. No large hip effusion. Vascular calcifications in the groin and pelvis IMPRESSION: 1. Obliquely oriented irregular linear lucency best seen on coronal images in the region of lesser trochanter with poorly visible cranial extent. Findings raise concern for subtle nondisplaced trochanteric fracture. Correlation with MRI should be considered. Electronically Signed   By: Donavan Foil M.D.   On: 09/02/2020 01:11   DG Hip Unilat W or Wo Pelvis 2-3 Views Right  Result Date: 09/02/2020 CLINICAL DATA:  Fall with hip pain EXAM: DG HIP (WITH OR WITHOUT PELVIS) 2-3V RIGHT COMPARISON:  None. FINDINGS: Extensive vascular calcifications. SI joints are non widened. Pubic symphysis and rami are intact. Questionable fracture lucency at the intertrochanteric right femur. IMPRESSION: Questionable subtle fracture lucency at the intertrochanteric right femur. Suggest CT for further evaluation. Electronically Signed   By: Donavan Foil M.D.   On: 09/02/2020 00:08    EKG: Independently reviewed. Sinus, no st elevation  Assessment/Plan Right hip pain Right hip fracture Fall     - admit to obs, tele     - EDP spoke with ortho (Dr. Rolena Infante), who recommended transfer to [facility], will await final ortho recommendation     - doubtful he will have any procedure tonight, so we will allow him to eat and make him NPO pMN     - imaging recommendation is for MRI w/o contrast to give better idea on his fracture     - mechanical fall without evidence of syncope, head injury     - will use hip fracture orderset     - TOC consult; PT/OT consult after possible surgery     - check vitamin d levels  EtOH abuse     - place on CIWA protocol     - counseled again further  EtOH use     - check thiamine, B12     - daily thiamine, MVI, folate  HTN     - resume home medication  CAD HLD     - continue ASA, BB, statin  GERD     - protonix  DVT prophylaxis: lovenox  Code Status: DNI  Family Communication: w/ dtr at bedside  Consults called: Orthopedics   Status is: Observation  The patient remains OBS appropriate and will d/c before 2 midnights.  Dispo:  The patient is from: Home              Anticipated d/c is to: Home              Patient currently is not medically stable to d/c.   Difficult to place patient No  Jonnie Finner DO [facility]  If 7PM-7AM, please contact night-coverage [email or web address]  09/02/2020, 3:33 PM

## 2020-09-02 NOTE — ED Notes (Signed)
Patient complaining of "shooting pain" in right leg, attempted repositioning for comfort, no improvement.  Attempted padding with a pillow for comfort, under knee, under lower back, under right hip, none improved the patient's pain.  Will speak with provider about other possible options.

## 2020-09-02 NOTE — Consult Note (Addendum)
Notified today at [place], that patient arrived at [facility] with Dr Marylyn Ishihara.  Radiologist recommended MRI and I restated my request that this be done.  Dr. Marylyn Ishihara agreed to order the exam.  He stated that it would be acceptable to see the patient tomorrow to determine treatment plan. Spoke to Dr Lyla Glassing - will make NPO tonight after midnight for surgery tomorrow afternoon if needed.  Full consult note to follow.

## 2020-09-02 NOTE — ED Notes (Signed)
Patient returned from CT

## 2020-09-02 NOTE — ED Notes (Signed)
Examination Room left open for patients safety while patients visitor is away. Staff will monitor

## 2020-09-02 NOTE — ED Notes (Signed)
Patient transported to CT at this time. 

## 2020-09-03 ENCOUNTER — Inpatient Hospital Stay (HOSPITAL_COMMUNITY): Payer: Medicare Other | Admitting: Anesthesiology

## 2020-09-03 ENCOUNTER — Encounter (HOSPITAL_COMMUNITY): Payer: Self-pay | Admitting: Internal Medicine

## 2020-09-03 ENCOUNTER — Inpatient Hospital Stay (HOSPITAL_COMMUNITY): Payer: Medicare Other

## 2020-09-03 ENCOUNTER — Telehealth: Payer: Medicare Other

## 2020-09-03 ENCOUNTER — Encounter (HOSPITAL_COMMUNITY)
Admission: EM | Disposition: A | Discharge status: Skilled Nursing Facility | Payer: Self-pay | Source: Home / Self Care | Attending: Internal Medicine

## 2020-09-03 DIAGNOSIS — R410 Disorientation, unspecified: Secondary | ICD-10-CM | POA: Diagnosis not present

## 2020-09-03 DIAGNOSIS — I252 Old myocardial infarction: Secondary | ICD-10-CM | POA: Diagnosis not present

## 2020-09-03 DIAGNOSIS — I10 Essential (primary) hypertension: Secondary | ICD-10-CM | POA: Diagnosis not present

## 2020-09-03 DIAGNOSIS — S72144A Nondisplaced intertrochanteric fracture of right femur, initial encounter for closed fracture: Secondary | ICD-10-CM | POA: Diagnosis not present

## 2020-09-03 DIAGNOSIS — Z79899 Other long term (current) drug therapy: Secondary | ICD-10-CM | POA: Diagnosis not present

## 2020-09-03 DIAGNOSIS — I2510 Atherosclerotic heart disease of native coronary artery without angina pectoris: Secondary | ICD-10-CM | POA: Diagnosis not present

## 2020-09-03 DIAGNOSIS — Z888 Allergy status to other drugs, medicaments and biological substances status: Secondary | ICD-10-CM | POA: Diagnosis not present

## 2020-09-03 DIAGNOSIS — G928 Other toxic encephalopathy: Secondary | ICD-10-CM | POA: Diagnosis present

## 2020-09-03 DIAGNOSIS — W010XXA Fall on same level from slipping, tripping and stumbling without subsequent striking against object, initial encounter: Secondary | ICD-10-CM | POA: Diagnosis present

## 2020-09-03 DIAGNOSIS — R278 Other lack of coordination: Secondary | ICD-10-CM | POA: Diagnosis present

## 2020-09-03 DIAGNOSIS — Z66 Do not resuscitate: Secondary | ICD-10-CM | POA: Diagnosis present

## 2020-09-03 DIAGNOSIS — Z4789 Encounter for other orthopedic aftercare: Secondary | ICD-10-CM | POA: Diagnosis not present

## 2020-09-03 DIAGNOSIS — R2681 Unsteadiness on feet: Secondary | ICD-10-CM | POA: Diagnosis present

## 2020-09-03 DIAGNOSIS — S7291XA Unspecified fracture of right femur, initial encounter for closed fracture: Secondary | ICD-10-CM | POA: Diagnosis not present

## 2020-09-03 DIAGNOSIS — Z955 Presence of coronary angioplasty implant and graft: Secondary | ICD-10-CM | POA: Diagnosis not present

## 2020-09-03 DIAGNOSIS — R2689 Other abnormalities of gait and mobility: Secondary | ICD-10-CM | POA: Diagnosis present

## 2020-09-03 DIAGNOSIS — F0280 Dementia in other diseases classified elsewhere without behavioral disturbance: Secondary | ICD-10-CM | POA: Diagnosis present

## 2020-09-03 DIAGNOSIS — Z811 Family history of alcohol abuse and dependence: Secondary | ICD-10-CM | POA: Diagnosis not present

## 2020-09-03 DIAGNOSIS — S72001A Fracture of unspecified part of neck of right femur, initial encounter for closed fracture: Secondary | ICD-10-CM | POA: Diagnosis present

## 2020-09-03 DIAGNOSIS — Z7982 Long term (current) use of aspirin: Secondary | ICD-10-CM | POA: Diagnosis not present

## 2020-09-03 DIAGNOSIS — S72141A Displaced intertrochanteric fracture of right femur, initial encounter for closed fracture: Secondary | ICD-10-CM | POA: Diagnosis not present

## 2020-09-03 DIAGNOSIS — F10239 Alcohol dependence with withdrawal, unspecified: Secondary | ICD-10-CM | POA: Diagnosis present

## 2020-09-03 DIAGNOSIS — M6281 Muscle weakness (generalized): Secondary | ICD-10-CM | POA: Diagnosis present

## 2020-09-03 DIAGNOSIS — R402411 Glasgow coma scale score 13-15, in the field [EMT or ambulance]: Secondary | ICD-10-CM | POA: Diagnosis not present

## 2020-09-03 DIAGNOSIS — G4089 Other seizures: Secondary | ICD-10-CM | POA: Diagnosis present

## 2020-09-03 DIAGNOSIS — J449 Chronic obstructive pulmonary disease, unspecified: Secondary | ICD-10-CM | POA: Diagnosis present

## 2020-09-03 DIAGNOSIS — M109 Gout, unspecified: Secondary | ICD-10-CM | POA: Diagnosis present

## 2020-09-03 DIAGNOSIS — F0390 Unspecified dementia without behavioral disturbance: Secondary | ICD-10-CM | POA: Diagnosis present

## 2020-09-03 DIAGNOSIS — Z7401 Bed confinement status: Secondary | ICD-10-CM | POA: Diagnosis not present

## 2020-09-03 DIAGNOSIS — Z20822 Contact with and (suspected) exposure to covid-19: Secondary | ICD-10-CM | POA: Diagnosis present

## 2020-09-03 DIAGNOSIS — W19XXXA Unspecified fall, initial encounter: Secondary | ICD-10-CM | POA: Diagnosis not present

## 2020-09-03 DIAGNOSIS — Z8249 Family history of ischemic heart disease and other diseases of the circulatory system: Secondary | ICD-10-CM | POA: Diagnosis not present

## 2020-09-03 DIAGNOSIS — F1010 Alcohol abuse, uncomplicated: Secondary | ICD-10-CM | POA: Diagnosis not present

## 2020-09-03 DIAGNOSIS — H9190 Unspecified hearing loss, unspecified ear: Secondary | ICD-10-CM | POA: Diagnosis present

## 2020-09-03 DIAGNOSIS — S72142A Displaced intertrochanteric fracture of left femur, initial encounter for closed fracture: Secondary | ICD-10-CM | POA: Diagnosis not present

## 2020-09-03 DIAGNOSIS — R079 Chest pain, unspecified: Secondary | ICD-10-CM | POA: Diagnosis present

## 2020-09-03 DIAGNOSIS — F17210 Nicotine dependence, cigarettes, uncomplicated: Secondary | ICD-10-CM | POA: Diagnosis present

## 2020-09-03 DIAGNOSIS — R488 Other symbolic dysfunctions: Secondary | ICD-10-CM | POA: Diagnosis present

## 2020-09-03 DIAGNOSIS — S72009A Fracture of unspecified part of neck of unspecified femur, initial encounter for closed fracture: Secondary | ICD-10-CM | POA: Diagnosis present

## 2020-09-03 DIAGNOSIS — K219 Gastro-esophageal reflux disease without esophagitis: Secondary | ICD-10-CM | POA: Diagnosis present

## 2020-09-03 DIAGNOSIS — M2550 Pain in unspecified joint: Secondary | ICD-10-CM | POA: Diagnosis not present

## 2020-09-03 DIAGNOSIS — E785 Hyperlipidemia, unspecified: Secondary | ICD-10-CM | POA: Diagnosis present

## 2020-09-03 DIAGNOSIS — S72144D Nondisplaced intertrochanteric fracture of right femur, subsequent encounter for closed fracture with routine healing: Secondary | ICD-10-CM | POA: Diagnosis not present

## 2020-09-03 DIAGNOSIS — G3183 Dementia with Lewy bodies: Secondary | ICD-10-CM | POA: Diagnosis present

## 2020-09-03 HISTORY — PX: FEMUR IM NAIL: SHX1597

## 2020-09-03 LAB — CBC
HCT: 43 % (ref 39.0–52.0)
Hemoglobin: 14.3 g/dL (ref 13.0–17.0)
MCH: 33.3 pg (ref 26.0–34.0)
MCHC: 33.3 g/dL (ref 30.0–36.0)
MCV: 100 fL (ref 80.0–100.0)
Platelets: 121 10*3/uL — ABNORMAL LOW (ref 150–400)
RBC: 4.3 MIL/uL (ref 4.22–5.81)
RDW: 13.9 % (ref 11.5–15.5)
WBC: 10.3 10*3/uL (ref 4.0–10.5)
nRBC: 0 % (ref 0.0–0.2)

## 2020-09-03 LAB — MRSA PCR SCREENING: MRSA by PCR: NEGATIVE

## 2020-09-03 LAB — CREATININE, SERUM
Creatinine, Ser: 0.8 mg/dL (ref 0.61–1.24)
GFR, Estimated: >60 mL/min (ref >60)

## 2020-09-03 IMAGING — RF DG HIP (WITH PELVIS) OPERATIVE*R*
1 series · 4 of 4 positions shown · non-contrast
Comparison: [DATE].

CLINICAL DATA: Right hip fracture.

EXAM:
OPERATIVE right HIP (WITH PELVIS IF PERFORMED) 4 VIEWS
TECHNIQUE: Fluoroscopic spot image(s) were submitted for interpretation
post-operatively.
Radiation exposure index: 3.6602 mGy.

[Series 1: unknown protocol · 0.20mm/px · 4 of 4 slices shown]
[im 1/4]
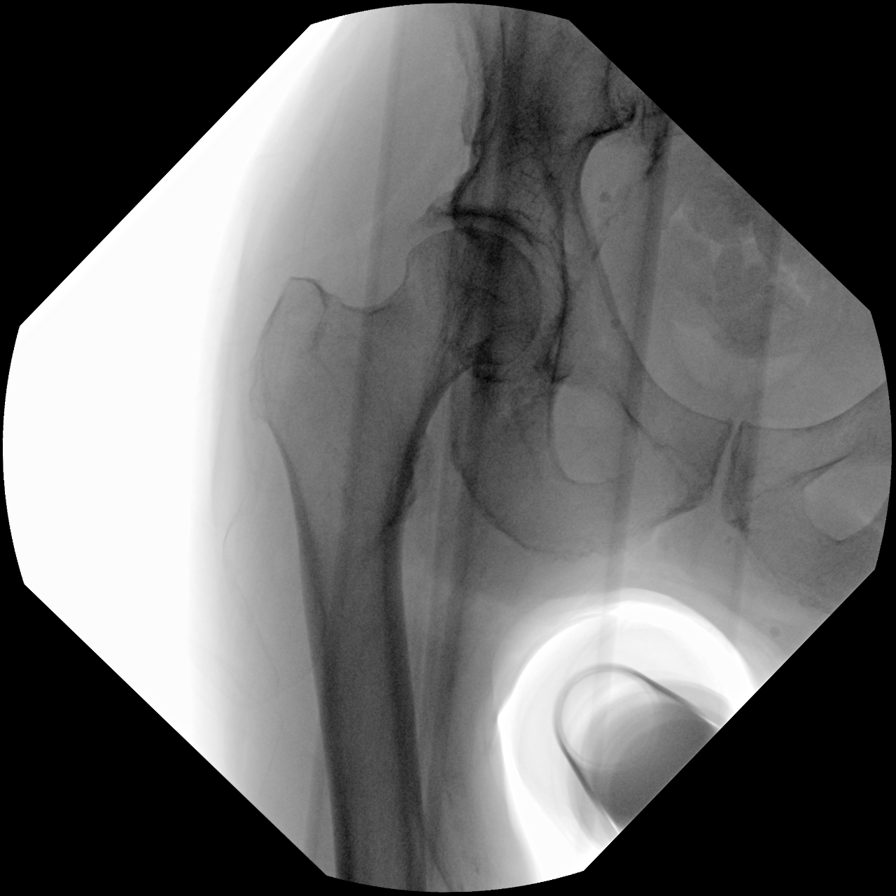
[im 2/4]
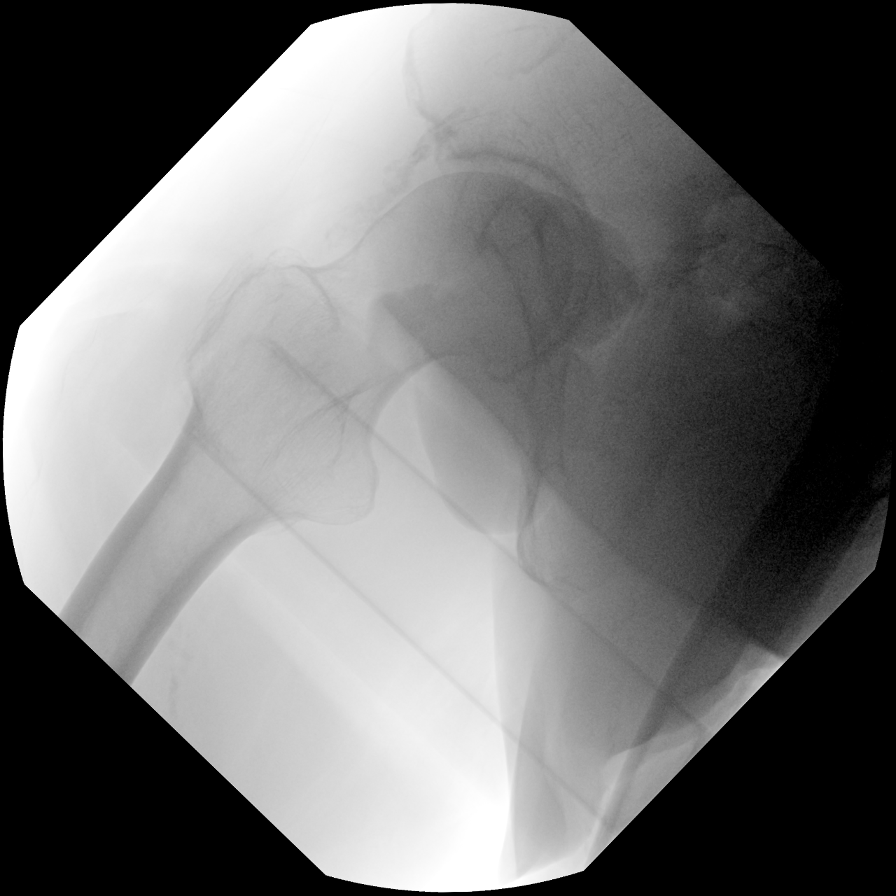
[im 3/4]
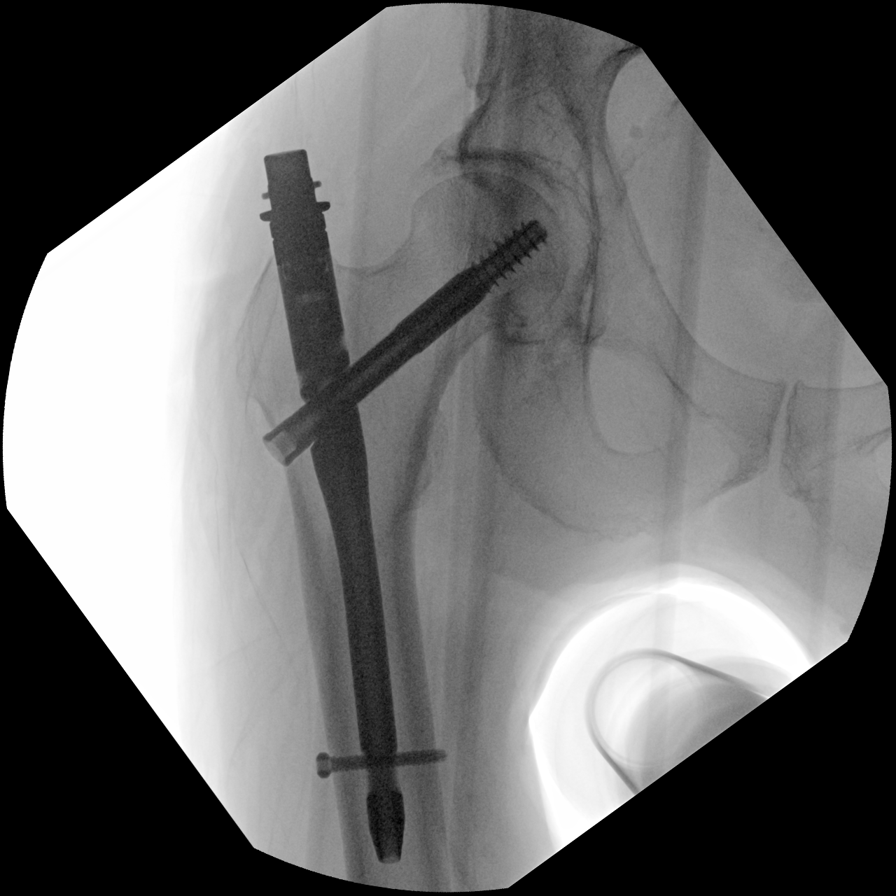
[im 4/4]
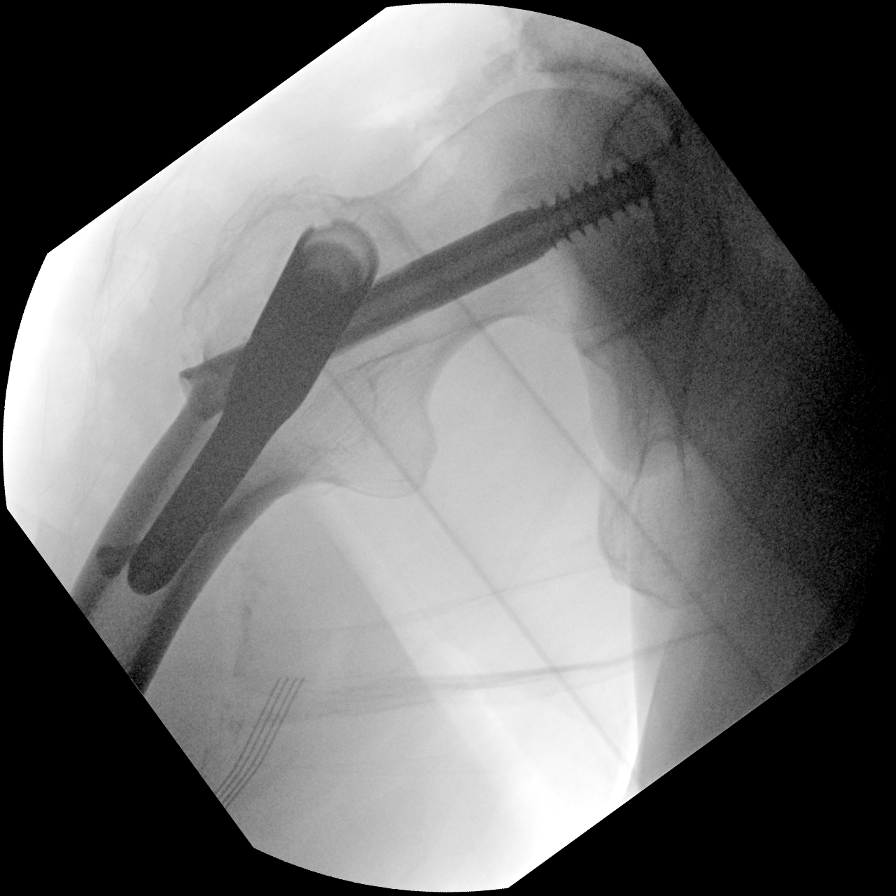

[4 of 4 positions shown; findings below may reference images not displayed]

FINDINGS: Four intraoperative fluoroscopic images demonstrate surgical
internal fixation of right proximal femoral fracture. Good alignment
of fracture components is noted.
IMPRESSION: Fluoroscopic guidance provided during right hip surgery.

## 2020-09-03 IMAGING — DX DG PORTABLE PELVIS
1 series · 1 of 1 positions shown · non-contrast
Comparison: [DATE], [DATE]

CLINICAL DATA: Status post right IM nail

EXAM:
PORTABLE PELVIS 1-2 VIEWS

[pelvis ap]
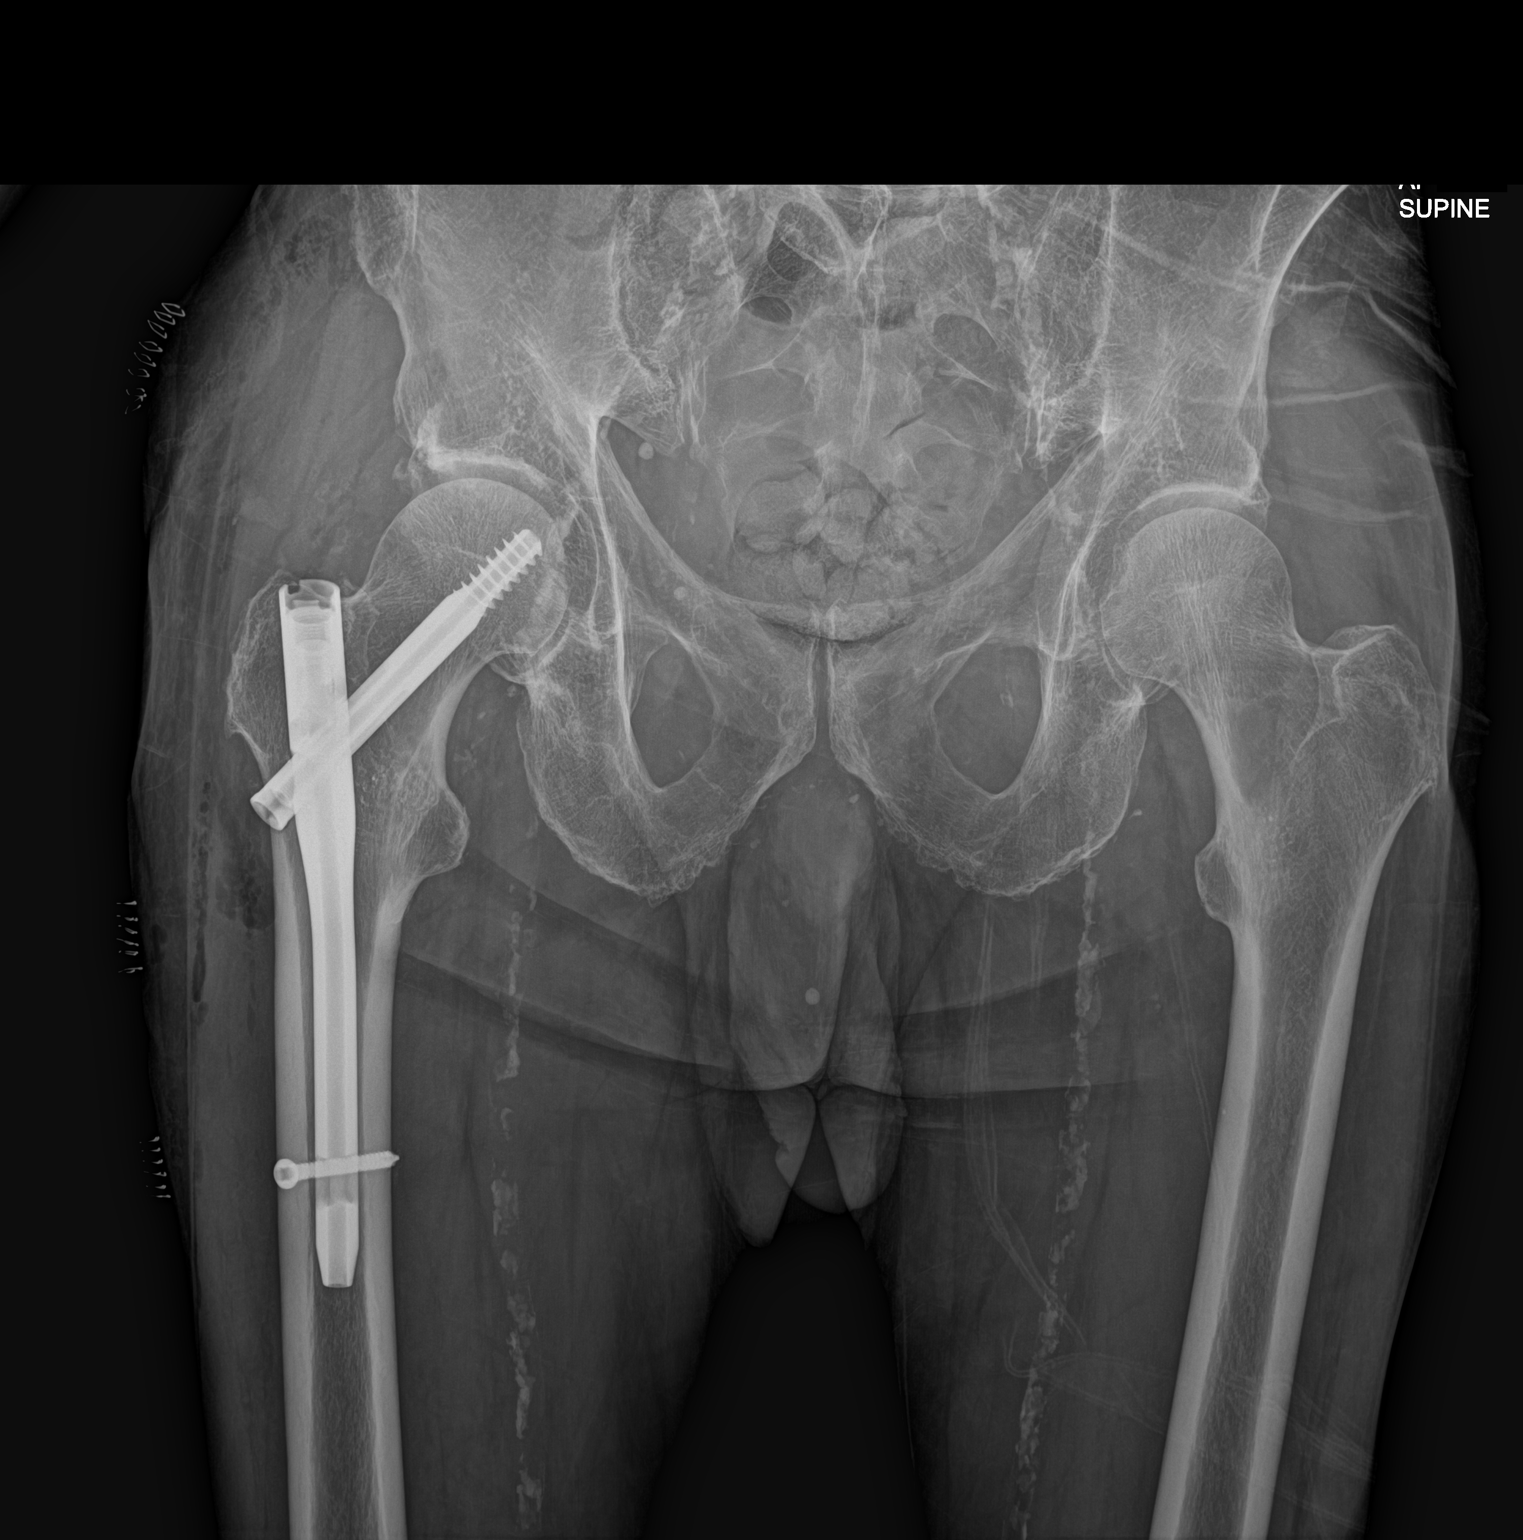

[1 of 1 positions shown; findings below may reference images not displayed]

FINDINGS: Interval intramedullary rod and distal screw fixation of the right
femur for nondisplaced intertrochanteric fracture. Gas in the soft
tissues consistent with recent surgery. Intact hardware and normal
alignment. Vascular calcifications.
IMPRESSION: Interval surgical fixation of nondisplaced right intertrochanteric
fracture.

## 2020-09-03 SURGERY — INSERTION, INTRAMEDULLARY ROD, FEMUR
Anesthesia: General | Laterality: Right

## 2020-09-03 MED ORDER — LACTATED RINGERS IV SOLN: INTRAVENOUS | Status: DC | PRN

## 2020-09-03 MED ORDER — METOCLOPRAMIDE HCL 5 MG PO TABS: 5.0000 mg | ORAL_TABLET | Freq: Three times a day (TID) | ORAL | Status: DC | PRN

## 2020-09-03 MED ORDER — CEFAZOLIN SODIUM-DEXTROSE 2-4 GM/100ML-% IV SOLN
2.0000 g | Freq: Four times a day (QID) | INTRAVENOUS | Status: AC
  Administered 2020-09-03 – 2020-09-04 (×2): 2 g via INTRAVENOUS
  Filled 2020-09-03 (×2): qty 100

## 2020-09-03 MED ORDER — DOCUSATE SODIUM 100 MG PO CAPS
100.0000 mg | ORAL_CAPSULE | Freq: Two times a day (BID) | ORAL | Status: DC
  Administered 2020-09-03 – 2020-09-10 (×14): 100 mg via ORAL
  Filled 2020-09-03 (×14): qty 1

## 2020-09-03 MED ORDER — CEFAZOLIN SODIUM-DEXTROSE 2-4 GM/100ML-% IV SOLN
2.0000 g | INTRAVENOUS | Status: AC
  Administered 2020-09-03: 2 g via INTRAVENOUS
  Filled 2020-09-03: qty 100

## 2020-09-03 MED ORDER — TRANEXAMIC ACID-NACL 1000-0.7 MG/100ML-% IV SOLN
1000.0000 mg | INTRAVENOUS | Status: AC
  Administered 2020-09-03: 1000 mg via INTRAVENOUS
  Filled 2020-09-03: qty 100

## 2020-09-03 MED ORDER — ACETAMINOPHEN 160 MG/5ML PO SOLN: 1000.0000 mg | Freq: Once | ORAL | Status: DC | PRN

## 2020-09-03 MED ORDER — FENTANYL CITRATE (PF) 100 MCG/2ML IJ SOLN
INTRAMUSCULAR | Status: AC
  Administered 2020-09-03: 50 ug via INTRAVENOUS
  Filled 2020-09-03: qty 2

## 2020-09-03 MED ORDER — LIDOCAINE 2% (20 MG/ML) 5 ML SYRINGE
INTRAMUSCULAR | Status: DC | PRN
  Administered 2020-09-03: 60 mg via INTRAVENOUS

## 2020-09-03 MED ORDER — MORPHINE SULFATE (PF) 2 MG/ML IV SOLN
0.5000 mg | INTRAVENOUS | Status: DC | PRN
  Administered 2020-09-03: 1 mg via INTRAVENOUS
  Filled 2020-09-03: qty 1

## 2020-09-03 MED ORDER — ACETAMINOPHEN 10 MG/ML IV SOLN: 1000.0000 mg | Freq: Once | INTRAVENOUS | Status: DC | PRN

## 2020-09-03 MED ORDER — TRAMADOL HCL 50 MG PO TABS
50.0000 mg | ORAL_TABLET | Freq: Four times a day (QID) | ORAL | Status: DC | PRN
Start: 2020-09-03 — End: 2020-09-10
  Administered 2020-09-03: 50 mg via ORAL
  Filled 2020-09-03: qty 1

## 2020-09-03 MED ORDER — ENOXAPARIN SODIUM 40 MG/0.4ML ~~LOC~~ SOLN
40.0000 mg | SUBCUTANEOUS | Status: DC
  Administered 2020-09-04 – 2020-09-10 (×7): 40 mg via SUBCUTANEOUS
  Filled 2020-09-03 (×7): qty 0.4

## 2020-09-03 MED ORDER — FENTANYL CITRATE (PF) 250 MCG/5ML IJ SOLN
INTRAMUSCULAR | Status: DC | PRN
  Administered 2020-09-03: 50 ug via INTRAVENOUS
  Administered 2020-09-03 (×2): 25 ug via INTRAVENOUS

## 2020-09-03 MED ORDER — ROCURONIUM BROMIDE 10 MG/ML (PF) SYRINGE
PREFILLED_SYRINGE | INTRAVENOUS | Status: DC | PRN
  Administered 2020-09-03: 60 mg via INTRAVENOUS

## 2020-09-03 MED ORDER — SODIUM CHLORIDE 0.9 % IR SOLN
Status: DC | PRN
  Administered 2020-09-03: 1000 mL

## 2020-09-03 MED ORDER — PROPOFOL 10 MG/ML IV BOLUS
INTRAVENOUS | Status: AC
  Filled 2020-09-03: qty 20

## 2020-09-03 MED ORDER — SUGAMMADEX SODIUM 200 MG/2ML IV SOLN
INTRAVENOUS | Status: DC | PRN
  Administered 2020-09-03: 200 mg via INTRAVENOUS

## 2020-09-03 MED ORDER — ACETAMINOPHEN 325 MG PO TABS
325.0000 mg | ORAL_TABLET | Freq: Four times a day (QID) | ORAL | Status: DC | PRN
Start: 2020-09-04 — End: 2020-09-10

## 2020-09-03 MED ORDER — ROCURONIUM BROMIDE 10 MG/ML (PF) SYRINGE
PREFILLED_SYRINGE | INTRAVENOUS | Status: AC
  Filled 2020-09-03: qty 10

## 2020-09-03 MED ORDER — LIDOCAINE 2% (20 MG/ML) 5 ML SYRINGE
INTRAMUSCULAR | Status: AC
  Filled 2020-09-03: qty 5

## 2020-09-03 MED ORDER — ONDANSETRON HCL 4 MG/2ML IJ SOLN: 4.0000 mg | Freq: Four times a day (QID) | INTRAMUSCULAR | Status: DC | PRN

## 2020-09-03 MED ORDER — ONDANSETRON HCL 4 MG PO TABS: 4.0000 mg | ORAL_TABLET | Freq: Four times a day (QID) | ORAL | Status: DC | PRN

## 2020-09-03 MED ORDER — DEXAMETHASONE SODIUM PHOSPHATE 10 MG/ML IJ SOLN
INTRAMUSCULAR | Status: DC | PRN
  Administered 2020-09-03: 4 mg via INTRAVENOUS

## 2020-09-03 MED ORDER — PROPOFOL 10 MG/ML IV BOLUS
INTRAVENOUS | Status: DC | PRN
  Administered 2020-09-03: 100 mg via INTRAVENOUS

## 2020-09-03 MED ORDER — FENTANYL CITRATE (PF) 100 MCG/2ML IJ SOLN
INTRAMUSCULAR | Status: AC
  Filled 2020-09-03: qty 2

## 2020-09-03 MED ORDER — OXYCODONE HCL 5 MG PO TABS: 5.0000 mg | ORAL_TABLET | Freq: Once | ORAL | Status: DC | PRN

## 2020-09-03 MED ORDER — METHOCARBAMOL 500 MG IVPB - SIMPLE MED
500.0000 mg | Freq: Four times a day (QID) | INTRAVENOUS | Status: DC | PRN
  Filled 2020-09-03: qty 50

## 2020-09-03 MED ORDER — MENTHOL 3 MG MT LOZG: 1.0000 | LOZENGE | OROMUCOSAL | Status: DC | PRN

## 2020-09-03 MED ORDER — CHLORHEXIDINE GLUCONATE 4 % EX LIQD
60.0000 mL | Freq: Once | CUTANEOUS | Status: AC
  Administered 2020-09-03: 4 via TOPICAL
  Filled 2020-09-03: qty 60

## 2020-09-03 MED ORDER — ACETAMINOPHEN 500 MG PO TABS
500.0000 mg | ORAL_TABLET | Freq: Four times a day (QID) | ORAL | Status: AC
  Administered 2020-09-03 – 2020-09-04 (×3): 500 mg via ORAL
  Filled 2020-09-03 (×3): qty 1

## 2020-09-03 MED ORDER — METHOCARBAMOL 500 MG PO TABS: 500.0000 mg | ORAL_TABLET | Freq: Four times a day (QID) | ORAL | Status: DC | PRN

## 2020-09-03 MED ORDER — ONDANSETRON HCL 4 MG/2ML IJ SOLN
INTRAMUSCULAR | Status: DC | PRN
  Administered 2020-09-03: 4 mg via INTRAVENOUS

## 2020-09-03 MED ORDER — ACETAMINOPHEN 500 MG PO TABS: 1000.0000 mg | ORAL_TABLET | Freq: Once | ORAL | Status: DC | PRN

## 2020-09-03 MED ORDER — FENTANYL CITRATE (PF) 100 MCG/2ML IJ SOLN: 25.0000 ug | INTRAMUSCULAR | Status: DC | PRN

## 2020-09-03 MED ORDER — POVIDONE-IODINE 10 % EX SWAB
2.0000 "application " | Freq: Once | CUTANEOUS | Status: AC
  Administered 2020-09-03: 2 via TOPICAL

## 2020-09-03 MED ORDER — PHENOL 1.4 % MT LIQD: 1.0000 | OROMUCOSAL | Status: DC | PRN

## 2020-09-03 MED ORDER — OXYCODONE HCL 5 MG/5ML PO SOLN: 5.0000 mg | Freq: Once | ORAL | Status: DC | PRN

## 2020-09-03 MED ORDER — METOCLOPRAMIDE HCL 5 MG/ML IJ SOLN: 5.0000 mg | Freq: Three times a day (TID) | INTRAMUSCULAR | Status: DC | PRN

## 2020-09-03 SURGICAL SUPPLY — 41 items
BIT DRILL CANN LG 4.3MM (BIT) IMPLANT
CHLORAPREP W/TINT 26 (MISCELLANEOUS) ×2 IMPLANT
COVER PERINEAL POST (MISCELLANEOUS) ×2 IMPLANT
COVER SURGICAL LIGHT HANDLE (MISCELLANEOUS) ×2 IMPLANT
DRAPE C-ARM 42X120 X-RAY (DRAPES) ×2 IMPLANT
DRAPE C-ARMOR (DRAPES) ×2 IMPLANT
DRAPE SHEET LG 3/4 BI-LAMINATE (DRAPES) ×2 IMPLANT
DRAPE STERI IOBAN 125X83 (DRAPES) ×2 IMPLANT
DRAPE U-SHAPE 47X51 STRL (DRAPES) ×4 IMPLANT
DRILL BIT CANN LG 4.3MM (BIT) ×2
DRSG AQUACEL AG 3.5X4 (GAUZE/BANDAGES/DRESSINGS) ×1 IMPLANT
DRSG AQUACEL AG ADV 3.5X 6 (GAUZE/BANDAGES/DRESSINGS) ×1 IMPLANT
FACESHIELD WRAPAROUND (MASK) ×6 IMPLANT
FACESHIELD WRAPAROUND OR TEAM (MASK) ×3 IMPLANT
GAUZE SPONGE 4X4 12PLY STRL (GAUZE/BANDAGES/DRESSINGS) ×2 IMPLANT
GLOVE BIOGEL M 8.0 STRL (GLOVE) ×6 IMPLANT
GLOVE SRG 8 PF TXTR STRL LF DI (GLOVE) ×1 IMPLANT
GLOVE SURG ENC MOIS LTX SZ8.5 (GLOVE) ×4 IMPLANT
GLOVE SURG UNDER LTX SZ7.5 (GLOVE) ×2 IMPLANT
GLOVE SURG UNDER POLY LF SZ8 (GLOVE) ×1
GLOVE SURG UNDER POLY LF SZ8.5 (GLOVE) ×2 IMPLANT
GOWN SPEC L3 XXLG W/TWL (GOWN DISPOSABLE) ×2 IMPLANT
GOWN STRL REUS W/TWL XL LVL3 (GOWN DISPOSABLE) ×2 IMPLANT
GUIDEPIN 3.2X17.5 THRD DISP (PIN) ×2 IMPLANT
HFN 125 DEG 11MM X 180MM (Orthopedic Implant) ×1 IMPLANT
HIP FRAC NAIL LAG SCR 10.5X100 (Orthopedic Implant) ×1 IMPLANT
IRRIGATION SURGIPHOR STRL (IV SOLUTION) IMPLANT
KIT BASIN OR (CUSTOM PROCEDURE TRAY) ×2 IMPLANT
KIT TURNOVER KIT A (KITS) ×2 IMPLANT
MANIFOLD NEPTUNE II (INSTRUMENTS) ×2 IMPLANT
MARKER SKIN DUAL TIP RULER LAB (MISCELLANEOUS) ×2 IMPLANT
PACK TOTAL JOINT (CUSTOM PROCEDURE TRAY) ×2 IMPLANT
SCREW BONE CORTICAL 5.0X38 (Screw) ×1 IMPLANT
SCREW CANN THRD AFF 10.5X100 (Orthopedic Implant) IMPLANT
SUT MNCRL AB 3-0 PS2 18 (SUTURE) IMPLANT
SUT MON AB 2-0 CT1 36 (SUTURE) ×2 IMPLANT
SUT VIC AB 1 CT1 27 (SUTURE) ×1
SUT VIC AB 1 CT1 27XBRD ANTBC (SUTURE) ×1 IMPLANT
TOWEL OR 17X26 10 PK STRL BLUE (TOWEL DISPOSABLE) ×2 IMPLANT
TOWEL OR NON WOVEN STRL DISP B (DISPOSABLE) ×2 IMPLANT
YANKAUER SUCT BULB TIP NO VENT (SUCTIONS) ×2 IMPLANT

## 2020-09-03 NOTE — Interval H&P Note (Signed)
History and Physical Interval Note:  09/03/2020 3:24 PM  Jesus Jordan  has presented today for surgery, with the diagnosis of right intertrochanteric femoral fracture.  The various methods of treatment have been discussed with the patient and family. After consideration of risks, benefits and other options for treatment, the patient has consented to  Procedure(s): INTRAMEDULLARY (IM) NAIL FEMORAL (Right) as a surgical intervention.  The patient's history has been reviewed, patient examined, no change in status, stable for surgery.  I have reviewed the patient's chart and labs.  Questions were answered to the patient's satisfaction.     The risks, benefits, and alternatives were discussed with the patient. There are risks associated with the surgery including, but not limited to, problems with anesthesia (death), infection, differences in leg length/angulation/rotation, fracture of bones, loosening or failure of implants, malunion, nonunion, hematoma (blood accumulation) which may require surgical drainage, blood clots, pulmonary embolism, nerve injury (foot drop), and blood vessel injury. The patient understands these risks and elects to proceed.   Hilton Cork Eulas Schweitzer

## 2020-09-03 NOTE — Progress Notes (Signed)
Patient very combative, unable to obtain VS at this time

## 2020-09-03 NOTE — Progress Notes (Signed)
Initial Nutrition Assessment  INTERVENTION:   Once diet advanced: -Ensure Surgery po BID, each supplement provides 330 kcal and 18 grams of protein  NUTRITION DIAGNOSIS:   Increased nutrient needs related to hip fracture,post-op healing as evidenced by estimated needs.  GOAL:   Patient will meet greater than or equal to 90% of their needs  MONITOR:   PO intake,Supplement acceptance,Labs,Weight trends,I & O's  REASON FOR ASSESSMENT:   Consult Hip fracture protocol  ASSESSMENT:   75 year old male with past medical history of coronary artery disease with stent, COPD, hypertension, EtOH abuse and what daughter describes as "memory issues".  Patient presenting today for evaluation of a fall.  Admitted for right hip fracture.  Patient disoriented x 4.  Pt with history of alcohol abuse and dementia per chart review. Currently NPO awaiting surgery this afternoon for right hip fracture.  Per weight records, pt's weight is up. Weight from 3/15 is 146 lbs which is consistent with past weights.   Medications: colace, Folic acid, Remeron, Multivitamin with minerals daily,  Thiamine   Labs reviewed.  NUTRITION - FOCUSED PHYSICAL EXAM:  Deferred given mental status.  Diet Order:   Diet Order            Diet NPO time specified Except for: Sips with Meds  Diet effective now                 EDUCATION NEEDS:   No education needs have been identified at this time  Skin:  Skin Assessment: Reviewed RN Assessment  Last BM:  PTA  Height:   Ht Readings from Last 1 Encounters:  09/02/20 5\' 10"  (1.778 m)    Weight:   Wt Readings from Last 1 Encounters:  09/02/20 79.4 kg    BMI:  Body mass index is 25.12 kg/m.  Estimated Nutritional Needs:   Kcal:  2100-2300  Protein:  90-100g  Fluid:  2.1L/day  Clayton Bibles, MS, RD, LDN Inpatient Clinical Dietitian Contact information available via Amion

## 2020-09-03 NOTE — H&P (View-Only) (Signed)
Patient ID: Jesus Jordan MRN: [id number] DOB/AGE: Nov 01, 1945 75 y.o.  Admit date: 09/01/2020  Admission Diagnoses:  Active Problems:   Closed right hip fracture (HCC)   HPI: On my exam, patient does not not appear alert or oriented. He is resting in bed quietly, arouses to my voice, but does not respond much to my questions. Unclear if this is his baseline, he is obviosuly on pain medication and got ativan. Per nursing he has been this way all night and has been incontinent; currently using a condom cath.  History copied forward from ED note:  Patient is a 75 year old male with past medical history of coronary artery disease with stent, COPD, hypertension, EtOH abuse and what daughter describes as "memory issues".  Patient presenting today for evaluation of a fall.  From what I am told he was playing tennis with friends earlier today.  He was running in an attempt to return to a ball when he twisted awkwardly, fell, and injured his right leg.  He is describing pain in his right hip.  He was unable to ambulate afterward and his friends carried him to the car and to his home.  His daughter brings him tonight due to the pain and inability to ambulate  Patient initially presented UC, images were concerning for Hip fracture, Ortho (Dr. Rolena Infante) was consulted. They recommended transfer to [facility] for evaluation. TRH was called for admission.   Patient on 81 mg ASA at baseline. No other blood thinners. He has been NPO since midnight.   Past Medical History: Past Medical History:  Diagnosis Date  . Alcohol abuse, daily use    05/29/20 - Reported consuming a 6 pack per day  . CAD in native artery 05/03/2016  . COPD (chronic obstructive pulmonary disease) 12/10/2018  . Coronary atherosclerosis 01/29/2009  . Essential hypertension 12/10/2018  . GERD (gastroesophageal reflux disease) 04/18/2018  . Gout   . Hearing loss   . History of MI (myocardial infarction) 09/12/2008   large inferolateral MI  secondary to total occlusion of circumflex with VF cardiac arrest  . History of nuclear stress test 02/23/2012   exercise myoview; area of scar in inferolateral wall at mid-basal region  . Hyperlipidemia 03/06/2013   target LDL less than 70  . Inguinal hernia 05/25/2009  . Major neurocognitive disorder due to multiple etiologies without behavioral disturbance 05/29/2020    Surgical History: Past Surgical History:  Procedure Laterality Date  . CORONARY STENT PLACEMENT  09/12/2008   r/t MI; 3.5x45mm Xience DES to circumflex & PTCA at multiple sites in distal region of vessel; alos diffusely diseaase, narrowed, nondominant RCA  . FOOT SURGERY  1960  . HERNIA REPAIR  2011   left inguinal hernia  . TRANSTHORACIC ECHOCARDIOGRAM  03/02/2009   IO=27-03%; LV systolic function borderline reduced with mild inferior wall hypokinesis; mild-mod MR; mild TR; AV mildly sclerotic; mild aortic root dilatation    Family History: Family History  Problem Relation Age of Onset  . Colon cancer Father   . Alcoholism Father   . Hyperlipidemia Brother   . Heart disease Brother 59       massive MI  . Raynaud syndrome Brother   . Cancer Paternal Grandfather        colon  . Dementia Sister        Unspecified type    Social History: Social History   Socioeconomic History  . Marital status: Single    Spouse name: Not on file  . Number of  children: 3  . Years of education: 82  . Highest education level: High school graduate  Occupational History  . Occupation: Retired    Comment: Development worker, international aid  Tobacco Use  . Smoking status: Current Some Day Smoker    Years: 30.00    Types: Cigarettes  . Smokeless tobacco: Never Used  . Tobacco comment: skip days some; pack last 3 days   Vaping Use  . Vaping Use: Never used  Substance and Sexual Activity  . Alcohol use: Yes    Alcohol/week: 42.0 standard drinks    Types: 42 Cans of beer per week    Comment: 6 pack per day  . Drug use: No  . Sexual  activity: Not on file  Other Topics Concern  . Not on file  Social History Narrative  . Not on file   Social Determinants of Health   Financial Resource Strain: Low Risk   . Difficulty of Paying Living Expenses: Not very hard  Food Insecurity: Not on file  Transportation Needs: No Transportation Needs  . Lack of Transportation (Medical): No  . Lack of Transportation (Non-Medical): No  Physical Activity: Not on file  Stress: Not on file  Social Connections: Not on file  Intimate Partner Violence: Not on file    Allergies: Imdur [isosorbide nitrate] and Lisinopril  Medications: I have reviewed the patient's current medications.  Vital Signs: Patient Vitals for the past 24 hrs:  BP Temp Temp src Pulse Resp SpO2  09/03/20 0503 125/80 97.6 F (36.4 C) Oral 76 20 98 %  09/03/20 0131 125/70 97.6 F (36.4 C) Oral 72 18 100 %  09/02/20 2142 102/68 99.9 F (37.7 C) Oral 74 20 96 %  09/02/20 1739 122/81 98.2 F (36.8 C) Oral 83 16 97 %  09/02/20 1323 132/78 98.2 F (36.8 C) Oral 67 15 100 %  09/02/20 1100 103/70 - - 64 17 98 %  09/02/20 0800 122/69 - - 65 14 100 %    Radiology: CT Hip Right Wo Contrast  Result Date: 09/02/2020 CLINICAL DATA:  Fall with right hip pain EXAM: CT OF THE RIGHT HIP WITHOUT CONTRAST TECHNIQUE: Multidetector CT imaging of the right hip was performed according to the standard protocol. Multiplanar CT image reconstructions were also generated. COMPARISON:  Radiograph 09/02/2020 FINDINGS: Bones/Joint/Cartilage Best seen on the coronal images is an obliquely oriented irregular linear lucency that extends from the lesser trochanter towards the greater trochanter, coronal series 6, image number 49, though lucency through the greater trochanter is not readily identified. Right femoral head alignment is normal. There may be faint linear lucency in cortical step-off at the lower aspect of the greater trochanter, coronal series 6, image number 33. The right pubic  rami appear intact. Ligaments Suboptimally assessed by CT. Muscles and Tendons No significant atrophy no intramuscular fluid collections. Soft tissues Suspected soft tissue edema about the proximal femur. No large hip effusion. Vascular calcifications in the groin and pelvis IMPRESSION: 1. Obliquely oriented irregular linear lucency best seen on coronal images in the region of lesser trochanter with poorly visible cranial extent. Findings raise concern for subtle nondisplaced trochanteric fracture. Correlation with MRI should be considered. Electronically Signed   By: Donavan Foil M.D.   On: 09/02/2020 01:11   MR HIP RIGHT WO CONTRAST  Result Date: 09/02/2020 CLINICAL DATA:  Fall, right hip pain, abnormal CT EXAM: MR OF THE RIGHT HIP WITHOUT CONTRAST TECHNIQUE: Multiplanar, multisequence MR imaging was performed. No intravenous contrast was administered. COMPARISON:  09/02/2020  FINDINGS: Bones: There is linear marrow edema within the intertrochanteric region of the proximal right femur, compatible with the nondisplaced fracture seen on recent CT. The subtle cortical discontinuity seen on CT is not apparent by MR. Remaining bony structures demonstrate normal signal characteristics. Articular cartilage and labrum Articular cartilage:  Mild diffuse articular cartilage thinning. Labrum:  No gross labral pathology. Joint or bursal effusion Joint effusion:  No evidence of joint effusion. Bursae: Unremarkable. Muscles and tendons Muscles and tendons: There is edema throughout the adductor musculature and quadratus femoris within the proximal right thigh. Minimal edema within the gluteal musculature. Other findings Miscellaneous: Intrapelvic structures are grossly unremarkable. Normal signal flow voids within the vasculature. IMPRESSION: 1. Marrow edema within the intertrochanteric region of the proximal right femur, compatible with nondisplaced fracture as seen on previous CT. 2. Edema within the adductor musculature,  quadratus femoris, and gluteal musculature as above. Electronically Signed   By: Randa Ngo M.D.   On: 09/02/2020 22:27   DG Hip Unilat W or Wo Pelvis 2-3 Views Right  Result Date: 09/02/2020 CLINICAL DATA:  Fall with hip pain EXAM: DG HIP (WITH OR WITHOUT PELVIS) 2-3V RIGHT COMPARISON:  None. FINDINGS: Extensive vascular calcifications. SI joints are non widened. Pubic symphysis and rami are intact. Questionable fracture lucency at the intertrochanteric right femur. IMPRESSION: Questionable subtle fracture lucency at the intertrochanteric right femur. Suggest CT for further evaluation. Electronically Signed   By: Donavan Foil M.D.   On: 09/02/2020 00:08    Labs: Recent Labs    09/02/20 0018 09/02/20 1714  WBC 9.0 8.0  RBC 4.14* 4.32  HCT 40.1 42.8  PLT 131* 139*   Recent Labs    09/02/20 0018 09/02/20 1714  NA 136 137  K 4.1 3.9  CL 103 104  CO2 26 24  BUN 22 15  CREATININE 0.90 0.81  GLUCOSE 102* 97  CALCIUM 9.1 9.2   Recent Labs    09/02/20 0018  INR 1.1    Review of Systems: Review of Systems  Unable to perform ROS: Dementia    Physical Exam: Body mass index is 25.12 kg/m.  Exam limited due to patients mental status. Patien: Lying in bed comfortable with penis exposed refusing to cover it, very drowsy. Opens eyes to my voice.  Complains of right hip pain. Distal sensation appears in tact.  Can wiggle toes.  No significant LE swelling. Palpable pulses distally.   Assessment and Plan: Right closed hip fracture.  Patient admitted to medicine service, has been NPO since midnight.  Images reviewed with Dr. Lyla Glassing; plan for Dr. Lyla Glassing to do surgery this afternoon.   Daughter not here at time of my exam.   Estill Bamberg Ward PA-C [facility]

## 2020-09-03 NOTE — Progress Notes (Signed)
PROGRESS NOTE    Jesus Jordan  UTM:[id number] DOB: [birth year] DOA: 09/01/2020 PCP: Eulas Post, MD   Chief Complaint  Patient presents with  . Fall    R. Leg Pain  Brief Narrative: 75 year old male with history of hypertension GERD hyperlipidemia, dementia history of alcohol abuse, has a daughter is a caregiver, very functional at baseline with short-term memory issues, brought to the ED after a fall while he was playing tennis 3/15. In the ED imaging concerning for hip fracture Ortho was consulted and MRI was ordered-which confirmed fracture.  Subjective: Patient was getting morphine and Ativan overnight this morning he was deeply sedated, did wake up briefly with tactile stimulation tol me his name-went back to sleep.  Daughter arrived later-reports he has dementia symptoms and at times gets hallucination and supposed to see neurology and likely has Lewy body dementia.  She moved in 2 months ago to take care for him.   Assessment & Plan:  Nondisplaced intertrochanteric fracture right femur 2/2 mechanical fall: MRI showed marrow edema within the intertrochanteric region of the proximal right femur compatible with nondisplaced fracture seen on the previous CT.  Orthopedic is consulted.  Stopped Lovenox got a dose last night.  At baseline patient appears very functional ambulatory history of CAD remotely with a stent placement but MET above 4, vitals are very stable, renal function hemoglobin normal, and is acceptable risk for moderate risk surgery.  Vitamin D level is stable.  Acute toxic metabolic encephalopathy/dementia likely Lewy body dementia-baseline with short-term memory issues and at times hallucination: Minimize Ativan/sedatives given deep sedation this am.  Cont on  fall precaution,supportive care delirium precaution.  May need to resume CIWA later- in afternoon he was waking up more.Continue his Remeron, Aricept.  Alcohol abuse watch for withdrawal signs.At this time minimize  Ativan as patient was very deeply sedated this morning. Consider resuming CIWA/haldol later if needed.  CAD remote history of a stent/Hyperlipidemia: Continue aspirin beta-blocker, Zetia and statin. Hypertension: Blood pressure stable.  Continue his metoprolol.  GERD continue PPI  Diet Order            Diet NPO time specified Except for: Sips with Meds  Diet effective now                 Patient's Body mass index is 25.12 kg/m.  DVT prophylaxis: SCD. D/c lovenox preop-defer to ortho post op. Code Status:   Code Status: Partial Code DNI, okay for CPR. Family Communication: plan of care discussed with patient's daughter at bedside. Discussed about overall plan of care  Status is: Observation Patient remains hospitalized will at least need 2 midnight stay for ongoing management of his hip fracture Dispo: The patient is from: Home              Anticipated d/c is to: SNF              Patient currently is not medically stable to d/c.   Difficult to place patient No   Unresulted Labs (From admission, onward)          Start     Ordered   09/03/20 1053  MRSA PCR Screening  Once,   R        09/03/20 1053         Medications reviewed:  Scheduled Meds: . allopurinol  300 mg Oral Daily  . aspirin  81 mg Oral QHS  . chlorhexidine  60 mL Topical Once  . docusate sodium  100  mg Oral BID  . donepezil  10 mg Oral QHS  . doxycycline  100 mg Oral Daily  . ezetimibe  10 mg Oral QHS  . folic acid  1 mg Oral Daily  . metoprolol succinate  25 mg Oral Daily  . mirtazapine  15 mg Oral QHS  . multivitamin with minerals  1 tablet Oral Daily  . simvastatin  40 mg Oral q1800  . thiamine  100 mg Oral Daily   Or  . thiamine  100 mg Intravenous Daily   Continuous Infusions: .  ceFAZolin (ANCEF) IV    . tranexamic acid      Consultants:see note  Procedures:see note  Antimicrobials: Anti-infectives (From admission, onward)   Start     Dose/Rate Route Frequency Ordered Stop    09/03/20 1130  ceFAZolin (ANCEF) IVPB 2g/100 mL premix        2 g 200 mL/hr over 30 Minutes Intravenous On call to O.R. 09/03/20 1040 09/04/20 0559   09/02/20 1745  doxycycline (VIBRA-TABS) tablet 100 mg       Note to Pharmacy: For rosacea/continuous     100 mg Oral Daily 09/02/20 1654       Culture/Microbiology    Component Value Date/Time   SDES FLUID SYNOVIAL KNEE LEFT 09/16/2008 1258   SPECREQUEST NONE 09/16/2008 1258   CULT NO GROWTH 3 DAYS 09/16/2008 1258   REPTSTATUS 09/20/2008 FINAL 09/16/2008 1258    Other culture-see note  Objective: Vitals: Today's Vitals   09/02/20 2142 09/03/20 0131 09/03/20 0503 09/03/20 1100  BP: 102/68 125/70 125/80   Pulse: 74 72 76   Resp: _0 Temp: 99.9 F (37.7 C) 97.6 F (36.4 C) 97.6 F (36.4 C)   TempSrc: Oral Oral Oral   SpO2: 96% 100% 98%   Weight:      Height:      PainSc:    Asleep    Intake/Output Summary (Last 24 hours) at 09/03/2020 1134 Last data filed at 09/03/2020 9604 Gross per 24 hour  Intake 120 ml  Output 450 ml  Net -330 ml   Filed Weights   09/01/20 2308 09/02/20 0134  Weight: 79.4 kg 79.4 kg   Weight change:   Intake/Output from previous day: 03/16 0701 - 03/17 0700 In: 120 [P.O.:120] Out: 450 [Urine:450] Intake/Output this shift: No intake/output data recorded. Filed Weights   09/01/20 2308 09/02/20 0134  Weight: 79.4 kg 79.4 kg    Examination: General exam: Sedated able to wake up briefly and tell me his name, on RA. HEENT:Oral mucosa moist, Ear/Nose WNL grossly,dentition normal. Respiratory system: bilaterally diminished,no use of accessory muscle, non tender. Cardiovascular system: S1 & S2 +, regular, No JVD. Gastrointestinal system: Abdomen soft, NT,ND, BS+. Nervous System: Sedated but withdraws/localizes to pain, moving extremities and grossly nonfocal Extremities: No edema, distal peripheral pulses palpable.  Skin: No rashes,no icterus. MSK: Normal muscle bulk,tone, power  Data  Reviewed: I have personally reviewed following labs and imaging studies CBC: Recent Labs  Lab 09/02/20 0018 09/02/20 1714  WBC 9.0 8.0  NEUTROABS 6.6  --   HGB 13.5 14.4  HCT 40.1 42.8  MCV 96.9 99.1  PLT 131* 540*   Basic Metabolic Panel: Recent Labs  Lab 09/02/20 0018 09/02/20 1714  NA 136 137  K 4.1 3.9  CL 103 104  CO2 26 24  GLUCOSE 102* 97  BUN 22 15  CREATININE 0.90 0.81  CALCIUM 9.1 9.2  MG  --  2.0  PHOS  --  3.0   GFR: Estimated Creatinine Clearance: 83.9 mL/min (by C-G formula based on SCr of 0.81 mg/dL). Liver Function Tests: Recent Labs  Lab 09/02/20 1714  AST 28  ALT 20  ALKPHOS 64  BILITOT 1.3*  PROT 7.1  ALBUMIN 4.1   No results for input(s): LIPASE, AMYLASE in the last 168 hours. No results for input(s): AMMONIA in the last 168 hours. Coagulation Profile: Recent Labs  Lab 09/02/20 0018  INR 1.1   Cardiac Enzymes: No results for input(s): CKTOTAL, CKMB, CKMBINDEX, TROPONINI in the last 168 hours. BNP (last 3 results) No results for input(s): PROBNP in the last 8760 hours. HbA1C: No results for input(s): HGBA1C in the last 72 hours. CBG: No results for input(s): GLUCAP in the last 168 hours. Lipid Profile: No results for input(s): CHOL, HDL, LDLCALC, TRIG, CHOLHDL, LDLDIRECT in the last 72 hours. Thyroid Function Tests: No results for input(s): TSH, T4TOTAL, FREET4, T3FREE, THYROIDAB in the last 72 hours. Anemia Panel: No results for input(s): VITAMINB12, FOLATE, FERRITIN, TIBC, IRON, RETICCTPCT in the last 72 hours. Sepsis Labs: No results for input(s): PROCALCITON, LATICACIDVEN in the last 168 hours.  Recent Results (from the past 240 hour(s))  Resp Panel by RT-PCR (Flu A&B, Covid) Nasopharyngeal Swab     Status: None   Collection Time: 09/02/20  1:26 AM   Specimen: Nasopharyngeal Swab; Nasopharyngeal(NP) swabs in vial transport medium  Result Value Ref Range Status   SARS Coronavirus 2 by RT PCR NEGATIVE NEGATIVE Final    Influenza A by PCR NEGATIVE NEGATIVE Final   Influenza B by PCR NEGATIVE NEGATIVE Final     Radiology Studies: CT Hip Right Wo Contrast  Result Date: 09/02/2020 CLINICAL DATA:  Fall with right hip pain EXAM: CT OF THE RIGHT HIP WITHOUT CONTRAST TECHNIQUE: Multidetector CT imaging of the right hip was performed according to the standard protocol. Multiplanar CT image reconstructions were also generated. COMPARISON:  Radiograph 09/02/2020 FINDINGS: Bones/Joint/Cartilage Best seen on the coronal images is an obliquely oriented irregular linear lucency that extends from the lesser trochanter towards the greater trochanter, coronal series 6, image number 49, though lucency through the greater trochanter is not readily identified. Right femoral head alignment is normal. There may be faint linear lucency in cortical step-off at the lower aspect of the greater trochanter, coronal series 6, image number 33. The right pubic rami appear intact. Ligaments Suboptimally assessed by CT. Muscles and Tendons No significant atrophy no intramuscular fluid collections. Soft tissues Suspected soft tissue edema about the proximal femur. No large hip effusion. Vascular calcifications in the groin and pelvis IMPRESSION: 1. Obliquely oriented irregular linear lucency best seen on coronal images in the region of lesser trochanter with poorly visible cranial extent. Findings raise concern for subtle nondisplaced trochanteric fracture. Correlation with MRI should be considered. Electronically Signed   By: Donavan Foil M.D.   On: 09/02/2020 01:11   MR HIP RIGHT WO CONTRAST  Result Date: 09/02/2020 CLINICAL DATA:  Fall, right hip pain, abnormal CT EXAM: MR OF THE RIGHT HIP WITHOUT CONTRAST TECHNIQUE: Multiplanar, multisequence MR imaging was performed. No intravenous contrast was administered. COMPARISON:  09/02/2020 FINDINGS: Bones: There is linear marrow edema within the intertrochanteric region of the proximal right femur,  compatible with the nondisplaced fracture seen on recent CT. The subtle cortical discontinuity seen on CT is not apparent by MR. Remaining bony structures demonstrate normal signal characteristics. Articular cartilage and labrum Articular cartilage:  Mild diffuse articular cartilage thinning. Labrum:  No gross labral  pathology. Joint or bursal effusion Joint effusion:  No evidence of joint effusion. Bursae: Unremarkable. Muscles and tendons Muscles and tendons: There is edema throughout the adductor musculature and quadratus femoris within the proximal right thigh. Minimal edema within the gluteal musculature. Other findings Miscellaneous: Intrapelvic structures are grossly unremarkable. Normal signal flow voids within the vasculature. IMPRESSION: 1. Marrow edema within the intertrochanteric region of the proximal right femur, compatible with nondisplaced fracture as seen on previous CT. 2. Edema within the adductor musculature, quadratus femoris, and gluteal musculature as above. Electronically Signed   By: Randa Ngo M.D.   On: 09/02/2020 22:27   DG Hip Unilat W or Wo Pelvis 2-3 Views Right  Result Date: 09/02/2020 CLINICAL DATA:  Fall with hip pain EXAM: DG HIP (WITH OR WITHOUT PELVIS) 2-3V RIGHT COMPARISON:  None. FINDINGS: Extensive vascular calcifications. SI joints are non widened. Pubic symphysis and rami are intact. Questionable fracture lucency at the intertrochanteric right femur. IMPRESSION: Questionable subtle fracture lucency at the intertrochanteric right femur. Suggest CT for further evaluation. Electronically Signed   By: Donavan Foil M.D.   On: 09/02/2020 00:08     LOS: 0 days   Antonieta Pert, MD [facility]  09/03/2020, 11:34 AM

## 2020-09-03 NOTE — Transfer of Care (Signed)
Immediate Anesthesia Transfer of Care Note  Patient: Jesus Jordan  Procedure(s) Performed: INTRAMEDULLARY (IM) NAIL FEMORAL (Right )  Patient Location: PACU  Anesthesia Type:General  Level of Consciousness: drowsy  Airway & Oxygen Therapy: Patient Spontanous Breathing and Patient connected to face mask oxygen  Post-op Assessment: Report given to RN and Post -op Vital signs reviewed and stable  Post vital signs: Reviewed and stable  Last Vitals:  Vitals Value Taken Time  BP 136/88 09/03/20 1723  Temp    Pulse 71 09/03/20 1726  Resp 16 09/03/20 1726  SpO2 100 % 09/03/20 1726  Vitals shown include unvalidated device data.  Last Pain:  Vitals:   09/03/20 1100  TempSrc:   PainSc: Asleep         Complications: No complications documented.

## 2020-09-03 NOTE — Consult Note (Signed)
Patient ID: Jesus Jordan MRN: [id number] DOB/AGE: 74-Oct-1947 74 y.o.  Admit date: 09/01/2020  Admission Diagnoses:  Active Problems:   Closed right hip fracture (HCC)   HPI: On my exam, patient does not not appear alert or oriented. He is resting in bed quietly, arouses to my voice, but does not respond much to my questions. Unclear if this is his baseline, he is obviosuly on pain medication and got ativan. Per nursing he has been this way all night and has been incontinent; currently using a condom cath.  History copied forward from ED note:  Patient is a 75 year old male with past medical history of coronary artery disease with stent, COPD, hypertension, EtOH abuse and what daughter describes as "memory issues".  Patient presenting today for evaluation of a fall.  From what I am told he was playing tennis with friends earlier today.  He was running in an attempt to return to a ball when he twisted awkwardly, fell, and injured his right leg.  He is describing pain in his right hip.  He was unable to ambulate afterward and his friends carried him to the car and to his home.  His daughter brings him tonight due to the pain and inability to ambulate  Patient initially presented UC, images were concerning for Hip fracture, Ortho (Dr. Rolena Infante) was consulted. They recommended transfer to [facility] for evaluation. [facility] was called for admission.   Patient on 81 mg ASA at baseline. No other blood thinners. He has been NPO since midnight.   Past Medical History: Past Medical History:  Diagnosis Date  . Alcohol abuse, daily use    05/29/20 - Reported consuming a 6 pack per day  . CAD in native artery 05/03/2016  . COPD (chronic obstructive pulmonary disease) 12/10/2018  . Coronary atherosclerosis 01/29/2009  . Essential hypertension 12/10/2018  . GERD (gastroesophageal reflux disease) 04/18/2018  . Gout   . Hearing loss   . History of MI (myocardial infarction) 09/12/2008   large inferolateral MI  secondary to total occlusion of circumflex with VF cardiac arrest  . History of nuclear stress test 02/23/2012   exercise myoview; area of scar in inferolateral wall at mid-basal region  . Hyperlipidemia 03/06/2013   target LDL less than 70  . Inguinal hernia 05/25/2009  . Major neurocognitive disorder due to multiple etiologies without behavioral disturbance 05/29/2020    Surgical History: Past Surgical History:  Procedure Laterality Date  . CORONARY STENT PLACEMENT  09/12/2008   r/t MI; 3.5x53mm Xience DES to circumflex & PTCA at multiple sites in distal region of vessel; alos diffusely diseaase, narrowed, nondominant RCA  . FOOT SURGERY  1960  . HERNIA REPAIR  2011   left inguinal hernia  . TRANSTHORACIC ECHOCARDIOGRAM  03/02/2009   VZ=56-38%; LV systolic function borderline reduced with mild inferior wall hypokinesis; mild-mod MR; mild TR; AV mildly sclerotic; mild aortic root dilatation    Family History: Family History  Problem Relation Age of Onset  . Colon cancer Father   . Alcoholism Father   . Hyperlipidemia Brother   . Heart disease Brother 6       massive MI  . Raynaud syndrome Brother   . Cancer Paternal Grandfather        colon  . Dementia Sister        Unspecified type    Social History: Social History   Socioeconomic History  . Marital status: Single    Spouse name: Not on file  . Number of  children: 3  . Years of education: 18  . Highest education level: High school graduate  Occupational History  . Occupation: Retired    Comment: Development worker, international aid  Tobacco Use  . Smoking status: Current Some Day Smoker    Years: 30.00    Types: Cigarettes  . Smokeless tobacco: Never Used  . Tobacco comment: skip days some; pack last 3 days   Vaping Use  . Vaping Use: Never used  Substance and Sexual Activity  . Alcohol use: Yes    Alcohol/week: 42.0 standard drinks    Types: 42 Cans of beer per week    Comment: 6 pack per day  . Drug use: No  . Sexual  activity: Not on file  Other Topics Concern  . Not on file  Social History Narrative  . Not on file   Social Determinants of Health   Financial Resource Strain: Low Risk   . Difficulty of Paying Living Expenses: Not very hard  Food Insecurity: Not on file  Transportation Needs: No Transportation Needs  . Lack of Transportation (Medical): No  . Lack of Transportation (Non-Medical): No  Physical Activity: Not on file  Stress: Not on file  Social Connections: Not on file  Intimate Partner Violence: Not on file    Allergies: Imdur [isosorbide nitrate] and Lisinopril  Medications: I have reviewed the patient's current medications.  Vital Signs: Patient Vitals for the past 24 hrs:  BP Temp Temp src Pulse Resp SpO2  09/03/20 0503 125/80 97.6 F (36.4 C) Oral 76 20 98 %  09/03/20 0131 125/70 97.6 F (36.4 C) Oral 72 18 100 %  09/02/20 2142 102/68 99.9 F (37.7 C) Oral 74 20 96 %  09/02/20 1739 122/81 98.2 F (36.8 C) Oral 83 16 97 %  09/02/20 1323 132/78 98.2 F (36.8 C) Oral 67 15 100 %  09/02/20 1100 103/70 - - 64 17 98 %  09/02/20 0800 122/69 - - 65 14 100 %    Radiology: CT Hip Right Wo Contrast  Result Date: 09/02/2020 CLINICAL DATA:  Fall with right hip pain EXAM: CT OF THE RIGHT HIP WITHOUT CONTRAST TECHNIQUE: Multidetector CT imaging of the right hip was performed according to the standard protocol. Multiplanar CT image reconstructions were also generated. COMPARISON:  Radiograph 09/02/2020 FINDINGS: Bones/Joint/Cartilage Best seen on the coronal images is an obliquely oriented irregular linear lucency that extends from the lesser trochanter towards the greater trochanter, coronal series 6, image number 49, though lucency through the greater trochanter is not readily identified. Right femoral head alignment is normal. There may be faint linear lucency in cortical step-off at the lower aspect of the greater trochanter, coronal series 6, image number 33. The right pubic  rami appear intact. Ligaments Suboptimally assessed by CT. Muscles and Tendons No significant atrophy no intramuscular fluid collections. Soft tissues Suspected soft tissue edema about the proximal femur. No large hip effusion. Vascular calcifications in the groin and pelvis IMPRESSION: 1. Obliquely oriented irregular linear lucency best seen on coronal images in the region of lesser trochanter with poorly visible cranial extent. Findings raise concern for subtle nondisplaced trochanteric fracture. Correlation with MRI should be considered. Electronically Signed   By: Donavan Foil M.D.   On: 09/02/2020 01:11   MR HIP RIGHT WO CONTRAST  Result Date: 09/02/2020 CLINICAL DATA:  Fall, right hip pain, abnormal CT EXAM: MR OF THE RIGHT HIP WITHOUT CONTRAST TECHNIQUE: Multiplanar, multisequence MR imaging was performed. No intravenous contrast was administered. COMPARISON:  09/02/2020  FINDINGS: Bones: There is linear marrow edema within the intertrochanteric region of the proximal right femur, compatible with the nondisplaced fracture seen on recent CT. The subtle cortical discontinuity seen on CT is not apparent by MR. Remaining bony structures demonstrate normal signal characteristics. Articular cartilage and labrum Articular cartilage:  Mild diffuse articular cartilage thinning. Labrum:  No gross labral pathology. Joint or bursal effusion Joint effusion:  No evidence of joint effusion. Bursae: Unremarkable. Muscles and tendons Muscles and tendons: There is edema throughout the adductor musculature and quadratus femoris within the proximal right thigh. Minimal edema within the gluteal musculature. Other findings Miscellaneous: Intrapelvic structures are grossly unremarkable. Normal signal flow voids within the vasculature. IMPRESSION: 1. Marrow edema within the intertrochanteric region of the proximal right femur, compatible with nondisplaced fracture as seen on previous CT. 2. Edema within the adductor musculature,  quadratus femoris, and gluteal musculature as above. Electronically Signed   By: Randa Ngo M.D.   On: 09/02/2020 22:27   DG Hip Unilat W or Wo Pelvis 2-3 Views Right  Result Date: 09/02/2020 CLINICAL DATA:  Fall with hip pain EXAM: DG HIP (WITH OR WITHOUT PELVIS) 2-3V RIGHT COMPARISON:  None. FINDINGS: Extensive vascular calcifications. SI joints are non widened. Pubic symphysis and rami are intact. Questionable fracture lucency at the intertrochanteric right femur. IMPRESSION: Questionable subtle fracture lucency at the intertrochanteric right femur. Suggest CT for further evaluation. Electronically Signed   By: Donavan Foil M.D.   On: 09/02/2020 00:08    Labs: Recent Labs    09/02/20 0018 09/02/20 1714  WBC 9.0 8.0  RBC 4.14* 4.32  HCT 40.1 42.8  PLT 131* 139*   Recent Labs    09/02/20 0018 09/02/20 1714  NA 136 137  K 4.1 3.9  CL 103 104  CO2 26 24  BUN 22 15  CREATININE 0.90 0.81  GLUCOSE 102* 97  CALCIUM 9.1 9.2   Recent Labs    09/02/20 0018  INR 1.1    Review of Systems: Review of Systems  Unable to perform ROS: Dementia    Physical Exam: Body mass index is 25.12 kg/m.  Exam limited due to patients mental status. Patien: Lying in bed comfortable with penis exposed refusing to cover it, very drowsy. Opens eyes to my voice.  Complains of right hip pain. Distal sensation appears in tact.  Can wiggle toes.  No significant LE swelling. Palpable pulses distally.   Assessment and Plan: Right closed hip fracture.  Patient admitted to medicine service, has been NPO since midnight.  Images reviewed with Dr. Lyla Glassing; plan for Dr. Lyla Glassing to do surgery this afternoon.   Daughter not here at time of my exam.   Estill Bamberg Ward PA-C [facility]

## 2020-09-03 NOTE — Anesthesia Procedure Notes (Signed)
Procedure Name: Intubation Performed by: Kaileah Shevchenko H, CRNA Pre-anesthesia Checklist: Patient identified, Emergency Drugs available, Suction available and Patient being monitored Patient Re-evaluated:Patient Re-evaluated prior to induction Oxygen Delivery Method: Circle System Utilized Preoxygenation: Pre-oxygenation with 100% oxygen Induction Type: IV induction Ventilation: Mask ventilation without difficulty Laryngoscope Size: Miller and 2 Grade View: Grade I Tube type: Oral Tube size: 7.5 mm Number of attempts: 1 Airway Equipment and Method: Stylet and Oral airway Placement Confirmation: ETT inserted through vocal cords under direct vision,  positive ETCO2 and breath sounds checked- equal and bilateral Secured at: 23 cm Tube secured with: Tape Dental Injury: Teeth and Oropharynx as per pre-operative assessment        

## 2020-09-03 NOTE — Op Note (Signed)
OPERATIVE REPORT  SURGEON: Rod Can, MD   ASSISTANT: Cherlynn June, PA-C.  PREOPERATIVE DIAGNOSIS: Right intertrochanteric femur fracture.   POSTOPERATIVE DIAGNOSIS: Right intertrochanteric femur fracture.   PROCEDURE: Intramedullary fixation, Right femur.   IMPLANTS: Biomet Affixus Hip Fracture Nail, 11 by 180 mm, 125 degrees. 10.5 x 100 mm Hip Fracture Nail Lag Screw. 5 x 38 mm distal interlocking screw 1.  ANESTHESIA:  General  ESTIMATED BLOOD LOSS:-100 mL    ANTIBIOTICS: 2 g Ancef.  DRAINS: None.  COMPLICATIONS: None.   CONDITION: PACU - hemodynamically stable.Marland Kitchen   BRIEF CLINICAL NOTE: Jesus Jordan is a 75 y.o. male who presented with an intertrochanteric femur fracture, seen on CT and MRI. The patient was admitted to the hospitalist service and underwent perioperative risk stratification and medical optimization. The risks, benefits, and alternatives to the procedure were explained, and the patient elected to proceed.  PROCEDURE IN DETAIL: Surgical site was marked by myself. The patient was taken to the operating room and anesthesia was induced on the bed. The patient was then transferred to the [facility] table and the nonoperative lower extremity was scissored underneath the operative side. The fracture was reduced with traction, internal rotation, and adduction. The hip was prepped and draped in the normal sterile surgical fashion. Timeout was called verifying side and site of surgery. Preop antibiotics were given with 60 minutes of beginning the procedure.  Fluoroscopy was used to define the patient's anatomy. A 4 cm incision was made just proximal to the tip of the greater trochanter. The awl was used to obtain the standard starting point for a trochanteric entry nail under fluoroscopic control. The guidepin was placed. The entry reamer was used to open the proximal femur.  On the back table, the nail was assembled onto the jig. The nail was placed into the femur without  any difficulty. Through a separate stab incision, the cannula was placed down to the bone in preparation for the cephalomedullary device. A guidepin was placed into the femoral head using AP and lateral fluoroscopy views. The pin was measured, and then reaming was performed to the appropriate depth. The lag screw was inserted to the appropriate depth. The fracture was compressed through the jig. The setscrew was tightened and then loosened one quarter turn. A separate stab incision was created, and the distal interlocking screw was placed using standard AO technique. The jig was removed. Final AP and lateral fluoroscopy views were obtained to confirm fracture reduction and hardware placement. Tip apex distance was appropriate. There was no chondral penetration.  The wounds were copiously irrigated with saline. The wound was closed in layers with #1 Vicryl for the fascia, 2-0 Monocryl for the deep dermal layer, and staples for skin. Glue was applied to the skin. Once the glue was fully hardened, sterile dressing was applied. The patient was then awakened from anesthesia and taken to the PACU in stable condition. Sponge needle and instrument counts were correct at the end of the case 2. There were no known complications.  We will readmit the patient to the hospitalist. Weightbearing status will be weightbearing as tolerated with a walker. We will begin Lovenox for DVT prophylaxis. The patient will work with physical therapy and undergo disposition planning.  Please note that a surgical assistant was a medical necessity for this procedure to perform it in a safe and expeditious manner. Assistant was necessary to provide appropriate retraction of vital neurovascular structures, to prevent femoral fracture, and to allow for anatomic placement of the prosthesis.

## 2020-09-03 NOTE — Progress Notes (Signed)
No Ativan per family request.

## 2020-09-03 NOTE — Discharge Instructions (Signed)
 Dr. Zackarie Chason Adult Hip & Knee Specialist [facility] [address] , [place] [address] [phone]   POSTOPERATIVE DIRECTIONS    Hip Rehabilitation, Guidelines Following Surgery   WEIGHT BEARING Weight bearing as tolerated with assist device (walker, cane, etc) as directed, use it as long as suggested by your surgeon or therapist, typically at least 4-6 weeks.   HOME CARE INSTRUCTIONS  Remove items at home which could result in a fall. This includes throw rugs or furniture in walking pathways.  Continue medications as instructed at time of discharge.  You may have some home medications which will be placed on hold until you complete the course of blood thinner medication.  4 days after discharge, you may start showering. No tub baths or soaking your incisions. Do not put on socks or shoes without following the instructions of your caregivers.   Sit on chairs with arms. Use the chair arms to help push yourself up when arising.  Arrange for the use of a toilet seat elevator so you are not sitting low.   Walk with walker as instructed.  You may resume a sexual relationship in one month or when given the OK by your caregiver.  Use walker as long as suggested by your caregivers.  Avoid periods of inactivity such as sitting longer than an hour when not asleep. This helps prevent blood clots.  You may return to work once you are cleared by your surgeon.  Do not drive a car for 6 weeks or until released by your surgeon.  Do not drive while taking narcotics.  Wear elastic stockings for two weeks following surgery during the day but you may remove then at night.  Make sure you keep all of your appointments after your operation with all of your doctors and caregivers. You should call the office at the above phone number and make an appointment for approximately two weeks after the date of your surgery. Please pick up a stool softener and laxative  for home use as long as you are requiring pain medications.  ICE to the affected hip every three hours for 30 minutes at a time and then as needed for pain and swelling. Continue to use ice on the hip for pain and swelling from surgery. You may notice swelling that will progress down to the foot and ankle.  This is normal after surgery.  Elevate the leg when you are not up walking on it.   It is important for you to complete the blood thinner medication as prescribed by your doctor.  Continue to use the breathing machine which will help keep your temperature down.  It is common for your temperature to cycle up and down following surgery, especially at night when you are not up moving around and exerting yourself.  The breathing machine keeps your lungs expanded and your temperature down.  RANGE OF MOTION AND STRENGTHENING EXERCISES  These exercises are designed to help you keep full movement of your hip joint. Follow your caregiver's or physical therapist's instructions. Perform all exercises about fifteen times, three times per day or as directed. Exercise both hips, even if you have had only one joint replacement. These exercises can be done on a training (exercise) mat, on the floor, on a table or on a bed. Use whatever works the best and is most comfortable for you. Use music or television while you are exercising so that the exercises are a pleasant break in your day. This   will make your life better with the exercises acting as a break in routine you can look forward to.  Lying on your back, slowly slide your foot toward your buttocks, raising your knee up off the floor. Then slowly slide your foot back down until your leg is straight again.  Lying on your back spread your legs as far apart as you can without causing discomfort.  Lying on your side, raise your upper leg and foot straight up from the floor as far as is comfortable. Slowly lower the leg and repeat.  Lying on your back, tighten up the  muscle in the front of your thigh (quadriceps muscles). You can do this by keeping your leg straight and trying to raise your heel off the floor. This helps strengthen the largest muscle supporting your knee.  Lying on your back, tighten up the muscles of your buttocks both with the legs straight and with the knee bent at a comfortable angle while keeping your heel on the floor.   SKILLED REHAB INSTRUCTIONS: If the patient is transferred to a skilled rehab facility following release from the hospital, a list of the current medications will be sent to the facility for the patient to continue.  When discharged from the skilled rehab facility, please have the facility set up the patient's Home Health Physical Therapy prior to being released. Also, the skilled facility will be responsible for providing the patient with their medications at time of release from the facility to include their pain medication and their blood thinner medication. If the patient is still at the rehab facility at time of the two week follow up appointment, the skilled rehab facility will also need to assist the patient in arranging follow up appointment in our office and any transportation needs.  MAKE SURE YOU:  Understand these instructions.  Will watch your condition.  Will get help right away if you are not doing well or get worse.  Pick up stool softner and laxative for home use following surgery while on pain medications. Daily dry dressing changes as needed. In 4 days, you may remove your dressings and begin taking showers - no tub baths or soaking the incisions. Continue to use ice for pain and swelling after surgery. Do not use any lotions or creams on the incision until instructed by your surgeon.   

## 2020-09-03 NOTE — Anesthesia Preprocedure Evaluation (Addendum)
Anesthesia Evaluation  Patient identified by MRN, date of birth, ID band Patient confused    Reviewed: Allergy & Precautions, NPO status , Patient's Chart, lab work & pertinent test results  History of Anesthesia Complications (+) history of anesthetic complications  Airway Mallampati: III  TM Distance: >3 FB Neck ROM: Full    Dental  (+) Dental Advisory Given   Pulmonary COPD, Current Smoker and Patient abstained from smoking.,    breath sounds clear to auscultation       Cardiovascular hypertension, Pt. on medications and Pt. on home beta blockers + CAD and + Past MI (2010)   Rhythm:Regular  large inferolateral MI secondary to total occlusion of circumflex with VF cardiac arrest  1. The left ventricle has normal systolic function, with an ejection  fraction of 55-60%. The cavity size was normal. Left ventricular diastolic  parameters were normal.  2. The right ventricle has normal systolic function. The cavity was  normal. There is no increase in right ventricular wall thickness.  3. Tricuspid valve regurgitation is mild-moderate.  4. The aortic valve is tricuspid. Moderate thickening of the aortic  valve. Moderate calcification of the aortic valve.  5. There is dilatation at the level of the sinuses of Valsalva measuring  39 mm.    Nuclear stress EF: 67%.  Findings consistent with prior myocardial infarction with peri-infarct ischemia.  This is a low risk study.  The left ventricular ejection fraction is hyperdynamic (>65%).   Small inferior wall infarct from apex to base with mild peri infarct ischemia EF preserved 67%    Neuro/Psych PSYCHIATRIC DISORDERS Dementia negative neurological ROS     GI/Hepatic GERD  ,(+)     substance abuse (6 pack/day)  alcohol use, Lab Results      Component                Value               Date                      ALT                      20                  09/02/2020                 AST                      28                  09/02/2020                ALKPHOS                  64                  09/02/2020                BILITOT                  1.3 (H)             09/02/2020              Endo/Other  negative endocrine ROS  Renal/GU negative Renal ROSLab Results      Component  Value               Date                      CREATININE               0.81                09/02/2020           Lab Results      Component                Value               Date                      K                        3.9                 09/02/2020             negative genitourinary   Musculoskeletal Right femur fx   Abdominal   Peds  Hematology negative hematology ROS (+) Lab Results      Component                Value               Date                      WBC                      8.0                 09/02/2020                HGB                      14.4                09/02/2020                HCT                      42.8                09/02/2020                MCV                      99.1                09/02/2020                PLT                      139 (L)             09/02/2020            lovenox [id number]   Anesthesia Other Findings Patient agitated and confused in setting of hospital admission, pain, dementia, polypharmacy and h/o daily etoh.   Reproductive/Obstetrics                            Anesthesia Physical Anesthesia Plan  ASA: III  Anesthesia  Plan: General   Post-op Pain Management:    Induction: Intravenous  PONV Risk Score and Plan: 1 and Ondansetron, Dexamethasone, Treatment may vary due to age or medical condition, Propofol infusion and TIVA  Airway Management Planned: Oral ETT and Mask  Additional Equipment: None  Intra-op Plan:   Post-operative Plan: Extubation in OR  Informed Consent:     Dental advisory given and Consent reviewed with POA  Plan Discussed with: CRNA and  Surgeon  Anesthesia Plan Comments: (ECHO 2020: IMPRESSIONS  1. The left ventricle has normal systolic function, with an ejection  fraction of 55-60%. The cavity size was normal. Left ventricular diastolic  parameters were normal.  2. The right ventricle has normal systolic function. The cavity was  normal. There is no increase in right ventricular wall thickness.  3. Tricuspid valve regurgitation is mild-moderate.  4. The aortic valve is tricuspid. Moderate thickening of the aortic  valve. Moderate calcification of the aortic valve.  5. There is dilatation at the level of the sinuses of Valsalva measuring  39 mm.  Lab Results      Component                Value               Date                      WBC                      8.0                 09/02/2020                HGB                      14.4                09/02/2020                HCT                      42.8                09/02/2020                MCV                      99.1                09/02/2020                PLT                      139 (L)             09/02/2020           Lab Results      Component                Value               Date                      NA                       137  09/02/2020                K                        3.9                 09/02/2020                CO2                      24                  09/02/2020                GLUCOSE                  97                  09/02/2020                BUN                      15                  09/02/2020                CREATININE               0.81                09/02/2020                CALCIUM                  9.2                 09/02/2020                GFRNONAA                 >60                 09/02/2020                GFRAA                    >60                 08/28/2019          )       Anesthesia Quick Evaluation

## 2020-09-04 ENCOUNTER — Encounter (HOSPITAL_COMMUNITY): Payer: Self-pay | Admitting: Orthopedic Surgery

## 2020-09-04 ENCOUNTER — Ambulatory Visit: Payer: Medicare Other | Admitting: Neurology

## 2020-09-04 DIAGNOSIS — S72001A Fracture of unspecified part of neck of right femur, initial encounter for closed fracture: Secondary | ICD-10-CM | POA: Diagnosis not present

## 2020-09-04 LAB — URINALYSIS, ROUTINE W REFLEX MICROSCOPIC
Bilirubin Urine: NEGATIVE
Glucose, UA: NEGATIVE mg/dL
Ketones, ur: 5 mg/dL — AB
Leukocytes,Ua: NEGATIVE
Nitrite: NEGATIVE
Protein, ur: NEGATIVE mg/dL
Specific Gravity, Urine: 1.011 (ref 1.005–1.030)
pH: 5 (ref 5.0–8.0)

## 2020-09-04 LAB — BASIC METABOLIC PANEL
Anion gap: 9 (ref 5–15)
BUN: 17 mg/dL (ref 8–23)
CO2: 24 mmol/L (ref 22–32)
Calcium: 9 mg/dL (ref 8.9–10.3)
Chloride: 103 mmol/L (ref 98–111)
Creatinine, Ser: 0.79 mg/dL (ref 0.61–1.24)
GFR, Estimated: >60 mL/min (ref >60)
Glucose, Bld: 135 mg/dL — ABNORMAL HIGH (ref 70–99)
Potassium: 4 mmol/L (ref 3.5–5.1)
Sodium: 136 mmol/L (ref 135–145)

## 2020-09-04 LAB — CBC
HCT: 38.5 % — ABNORMAL LOW (ref 39.0–52.0)
Hemoglobin: 12.9 g/dL — ABNORMAL LOW (ref 13.0–17.0)
MCH: 32.8 pg (ref 26.0–34.0)
MCHC: 33.5 g/dL (ref 30.0–36.0)
MCV: 98 fL (ref 80.0–100.0)
Platelets: 127 10*3/uL — ABNORMAL LOW (ref 150–400)
RBC: 3.93 MIL/uL — ABNORMAL LOW (ref 4.22–5.81)
RDW: 13.6 % (ref 11.5–15.5)
WBC: 11.5 10*3/uL — ABNORMAL HIGH (ref 4.0–10.5)
nRBC: 0 % (ref 0.0–0.2)

## 2020-09-04 LAB — VITAMIN B12: Vitamin B-12: 324 pg/mL (ref 180–914)

## 2020-09-04 LAB — TSH: TSH: 0.402 u[IU]/mL (ref 0.350–4.500)

## 2020-09-04 LAB — AMMONIA: Ammonia: <9 umol/L — ABNORMAL LOW (ref 9–35)

## 2020-09-04 MED ORDER — SENNOSIDES-DOCUSATE SODIUM 8.6-50 MG PO TABS
1.0000 | ORAL_TABLET | Freq: Two times a day (BID) | ORAL | Status: DC
  Administered 2020-09-05 – 2020-09-10 (×11): 1 via ORAL
  Filled 2020-09-04 (×12): qty 1

## 2020-09-04 MED ORDER — ENSURE SURGERY PO LIQD
237.0000 mL | Freq: Two times a day (BID) | ORAL | Status: DC
  Administered 2020-09-04 – 2020-09-10 (×4): 237 mL via ORAL
  Filled 2020-09-04 (×14): qty 237

## 2020-09-04 MED ORDER — TRAMADOL HCL 50 MG PO TABS: 50.0000 mg | ORAL_TABLET | Freq: Four times a day (QID) | ORAL | 0 refills | Status: DC | PRN

## 2020-09-04 MED ORDER — QUETIAPINE FUMARATE 25 MG PO TABS
25.0000 mg | ORAL_TABLET | Freq: Every evening | ORAL | Status: DC | PRN
  Administered 2020-09-04 – 2020-09-09 (×2): 25 mg via ORAL
  Filled 2020-09-04 (×2): qty 1

## 2020-09-04 MED ORDER — HYDROCODONE-ACETAMINOPHEN 5-325 MG PO TABS: 1.0000 | ORAL_TABLET | Freq: Four times a day (QID) | ORAL | 0 refills | Status: AC | PRN

## 2020-09-04 MED ORDER — ASPIRIN 81 MG PO CHEW: 81.0000 mg | CHEWABLE_TABLET | Freq: Two times a day (BID) | ORAL | 0 refills | Status: AC

## 2020-09-04 MED ORDER — CHLORDIAZEPOXIDE HCL 5 MG PO CAPS
10.0000 mg | ORAL_CAPSULE | Freq: Three times a day (TID) | ORAL | Status: DC | PRN
  Administered 2020-09-05: 10 mg via ORAL
  Filled 2020-09-04 (×2): qty 2

## 2020-09-04 MED ORDER — ACETAMINOPHEN 325 MG PO TABS: 325.0000 mg | ORAL_TABLET | Freq: Four times a day (QID) | ORAL | 0 refills | Status: DC | PRN

## 2020-09-04 MED ORDER — CHLORDIAZEPOXIDE HCL 5 MG PO CAPS
10.0000 mg | ORAL_CAPSULE | Freq: Three times a day (TID) | ORAL | Status: DC
  Administered 2020-09-04 (×2): 10 mg via ORAL
  Filled 2020-09-04: qty 2

## 2020-09-04 NOTE — Progress Notes (Signed)
PROGRESS NOTE    Jesus Jordan  ZSW:[id number] DOB: [birth year] DOA: 09/01/2020 PCP: Eulas Post, MD   Chief Complaint  Patient presents with  . Fall    R. Leg Pain  Brief Narrative: 75 year old male with history of hypertension GERD hyperlipidemia, dementia history of alcohol abuse, has a daughter is a caregiver, very functional at baseline with short-term memory issues, brought to the ED after a fall while he was playing tennis 3/15. In the ED imaging concerning for hip fracture Ortho was consulted and MRI was ordered-which confirmed fracture. Seen by orthopedic underwent right intertrochanteric IM fixation.  Subjective: Seen this morning.Daughter was here overnight left this morning.Patient is alert awake able to tell me his name knows that he had a fracture but appears confused follows commands.Unable to tell current location current date or current president.  Not sweating or tremulous on exam.Telemetry sitter in place.     Assessment & Plan:  Nondisplaced intertrochanteric fracture right femur 2/2 mechanical fall: Status post right IM fixation 3/17.Continue DVT prophylaxis with Lovenox here, continue pain management. Defer pain management rx and DVT prophylaxis rx for outpatient to orthopedics team.  PT OT.Fall precaution supportive care.Vitamin D level is stable.  Acute metabolic encephalopathy/delirium multifactorial with alcohol withdrawal given his significant alcohol use history, in the setting of Lewy body dementia, pain hip fracture, hospitalization-baseline with short-term memory issues and at times hallucination: Patient was deeply sedated with Ativan yesterday.  He did relatively well after anesthesia and more coherent.  This morning alert awake but appears to be confused delirious, confirmed with the daughter that he does drink 6 packs of beer daily, and asking for beer here-psych is consulted- discussed w/ psych team okay to try low-dose Librium 10 mg  TID to get him  through this withdrawal- asked RN to hold for sedation discussed w/ daughter- she understands that he may react differently to usual meds-Check tsh/ammonia/b12.  UA no signs of infection. continue one-to-one telemetry sitter fall precaution.  Continue his  Remeron, Aricept.  CAD remote history of a stent/Hyperlipidemia:Continue aspirin beta-blocker, Zetia and statin.  Hypertension:Controlled on metoprolol.  GERD:continue PPI  Diet Order            Diet regular Room service appropriate? Yes; Fluid consistency: Thin  Diet effective now                 Patient's Body mass index is 25.12 kg/m.  DVT prophylaxis: enoxaparin (LOVENOX) injection 40 mg Start: 09/04/20 0800 SCDs Start: 09/03/20 1841. Code Status:   Code Status: Partial Code DNI, okay for CPR. Family Communication: plan of care discussed with patient's daughter at bedside 3/17 and again discussed 3/18.  Status is: Inpatient Patient remains hospitalized for ongoing post hip care, delirium Dispo: The patient is from: Home              Anticipated d/c is to: SNF  Vs HH pending ptot, 1-2 days.  Daughter unable to provide 24/7 assistance at home, given his mentation  he will likely need SNF placement              Patient currently is not medically stable to d/c.   Difficult to place patient No   Unresulted Labs (From admission, onward)          Start     Ordered   09/10/20 0500  Creatinine, serum  (enoxaparin (LOVENOX)  CrCl >/= 30 mL/min  )  Weekly,   R     Comments: while on enoxaparin  therapy.    09/03/20 1840   09/04/20 0735  Vitamin B1  Add-on,   AD        09/04/20 0735   09/04/20 6203  Basic metabolic panel  Daily,   R     Question:  Specimen collection method  Answer:  Lab=Lab collect   09/03/20 1406   09/04/20 0500  CBC  Daily,   R     Question:  Specimen collection method  Answer:  Lab=Lab collect   09/03/20 1406   09/04/20 0500  CBC  Daily,   R      09/03/20 1840   09/04/20 5597  Basic metabolic panel   Daily,   R      09/03/20 1840         Medications reviewed:  Scheduled Meds: . acetaminophen  500 mg Oral Q6H  . allopurinol  300 mg Oral Daily  . aspirin  81 mg Oral QHS  . docusate sodium  100 mg Oral BID  . donepezil  10 mg Oral QHS  . doxycycline  100 mg Oral Daily  . enoxaparin (LOVENOX) injection  40 mg Subcutaneous Q24H  . ezetimibe  10 mg Oral QHS  . feeding supplement  237 mL Oral BID BM  . folic acid  1 mg Oral Daily  . metoprolol succinate  25 mg Oral Daily  . mirtazapine  15 mg Oral QHS  . multivitamin with minerals  1 tablet Oral Daily  . simvastatin  40 mg Oral q1800  . thiamine  100 mg Oral Daily   Or  . thiamine  100 mg Intravenous Daily   Continuous Infusions: . methocarbamol (ROBAXIN) IV      Consultants:see note  Procedures:see note  Antimicrobials: Anti-infectives (From admission, onward)   Start     Dose/Rate Route Frequency Ordered Stop   09/03/20 2200  ceFAZolin (ANCEF) IVPB 2g/100 mL premix        2 g 200 mL/hr over 30 Minutes Intravenous Every 6 hours 09/03/20 1840 09/04/20 0430   09/03/20 1130  ceFAZolin (ANCEF) IVPB 2g/100 mL premix        2 g 200 mL/hr over 30 Minutes Intravenous On call to O.R. 09/03/20 1040 09/03/20 1629   09/02/20 1745  doxycycline (VIBRA-TABS) tablet 100 mg       Note to Pharmacy: For rosacea/continuous     100 mg Oral Daily 09/02/20 1654       Culture/Microbiology    Component Value Date/Time   SDES FLUID SYNOVIAL KNEE LEFT 09/16/2008 1258   SPECREQUEST NONE 09/16/2008 1258   CULT NO GROWTH 3 DAYS 09/16/2008 1258   REPTSTATUS 09/20/2008 FINAL 09/16/2008 1258    Other culture-see note  Objective: Vitals: Today's Vitals   09/03/20 2018 09/03/20 2342 09/04/20 0021 09/04/20 0357  BP: 120/71 118/75  130/84  Pulse: 84 83  76  Resp: 18 20  20   Temp: 98.1 F (36.7 C) 97.6 F (36.4 C)  97.6 F (36.4 C)  TempSrc: Oral Oral  Oral  SpO2: 96% 95%  97%  Weight:      Height:      PainSc:   Asleep      Intake/Output Summary (Last 24 hours) at 09/04/2020 1030 Last data filed at 09/04/2020 1000 Gross per 24 hour  Intake 368 ml  Output 860 ml  Net -492 ml   Filed Weights   09/01/20 2308 09/02/20 0134  Weight: 79.4 kg 79.4 kg   Weight change:   Intake/Output from previous day:  03/17 0701 - 03/18 0700 In: 248 [IV Piggyback:248] Out: 860 [Urine:760; Blood:100] Intake/Output this shift: Total I/O In: 120 [P.O.:120] Out: -  Filed Weights   09/01/20 2308 09/02/20 0134  Weight: 79.4 kg 79.4 kg    Examination: General exam: AAOx1-2, pleasantly confused smiling,NAD, weak appearing. HEENT:Oral mucosa moist, Ear/Nose WNL grossly, dentition normal. Respiratory system: bilaterally diminishedd,no wheezing or crackles,no use of accessory muscle Cardiovascular system: S1 & S2 +, No JVD,. Gastrointestinal system: Abdomen soft, NT,ND, BS+ Nervous System:Alert, awake, moving extremities and grossly nonfocal Extremities: Right hip surgical site with Aquacel dressing in place x2 no edema, distal peripheral pulses palpable.  Skin: No rashes,no icterus. MSK: Normal muscle bulk,tone, power  Data Reviewed: I have personally reviewed following labs and imaging studies CBC: Recent Labs  Lab 09/02/20 0018 09/02/20 1714 09/03/20 2043 09/04/20 0522  WBC 9.0 8.0 10.3 11.5*  NEUTROABS 6.6  --   --   --   HGB 13.5 14.4 14.3 12.9*  HCT 40.1 42.8 43.0 38.5*  MCV 96.9 99.1 100.0 98.0  PLT 131* 139* 121* 865*   Basic Metabolic Panel: Recent Labs  Lab 09/02/20 0018 09/02/20 1714 09/03/20 2043 09/04/20 0522  NA 136 137  --  136  K 4.1 3.9  --  4.0  CL 103 104  --  103  CO2 26 24  --  24  GLUCOSE 102* 97  --  135*  BUN 22 15  --  17  CREATININE 0.90 0.81 0.80 0.79  CALCIUM 9.1 9.2  --  9.0  MG  --  2.0  --   --   PHOS  --  3.0  --   --    GFR: Estimated Creatinine Clearance: 84.9 mL/min (by C-G formula based on SCr of 0.79 mg/dL). Liver Function Tests: Recent Labs  Lab  09/02/20 1714  AST 28  ALT 20  ALKPHOS 64  BILITOT 1.3*  PROT 7.1  ALBUMIN 4.1   No results for input(s): LIPASE, AMYLASE in the last 168 hours. Recent Labs  Lab 09/04/20 0819  AMMONIA <9*   Coagulation Profile: Recent Labs  Lab 09/02/20 0018  INR 1.1   Cardiac Enzymes: No results for input(s): CKTOTAL, CKMB, CKMBINDEX, TROPONINI in the last 168 hours. BNP (last 3 results) No results for input(s): PROBNP in the last 8760 hours. HbA1C: No results for input(s): HGBA1C in the last 72 hours. CBG: No results for input(s): GLUCAP in the last 168 hours. Lipid Profile: No results for input(s): CHOL, HDL, LDLCALC, TRIG, CHOLHDL, LDLDIRECT in the last 72 hours. Thyroid Function Tests: Recent Labs    09/04/20 0522  TSH 0.402   Anemia Panel: Recent Labs    09/04/20 0522  VITAMINB12 324   Sepsis Labs: No results for input(s): PROCALCITON, LATICACIDVEN in the last 168 hours.  Recent Results (from the past 240 hour(s))  Resp Panel by RT-PCR (Flu A&B, Covid) Nasopharyngeal Swab     Status: None   Collection Time: 09/02/20  1:26 AM   Specimen: Nasopharyngeal Swab; Nasopharyngeal(NP) swabs in vial transport medium  Result Value Ref Range Status   SARS Coronavirus 2 by RT PCR NEGATIVE NEGATIVE Final   Influenza A by PCR NEGATIVE NEGATIVE Final   Influenza B by PCR NEGATIVE NEGATIVE Final  MRSA PCR Screening     Status: None   Collection Time: 09/03/20 11:06 AM   Specimen: Nasal Mucosa; Nasopharyngeal  Result Value Ref Range Status   MRSA by PCR NEGATIVE NEGATIVE Final    Comment:  The GeneXpert MRSA Assay (FDA approved for NASAL specimens only), is one component of a comprehensive MRSA colonization surveillance program. It is not intended to diagnose MRSA infection nor to guide or monitor treatment for MRSA infections. Performed at [facility], [place] [address] [place], [place] [address]      Radiology Studies: Pelvis Portable  Result  Date: 09/03/2020 CLINICAL DATA:  Status post right IM nail EXAM: PORTABLE PELVIS 1-2 VIEWS COMPARISON:  09/03/2020, 09/02/2020 FINDINGS: Interval intramedullary rod and distal screw fixation of the right femur for nondisplaced intertrochanteric fracture. Gas in the soft tissues consistent with recent surgery. Intact hardware and normal alignment. Vascular calcifications. IMPRESSION: Interval surgical fixation of nondisplaced right intertrochanteric fracture. Electronically Signed   By: Donavan Foil M.D.   On: 09/03/2020 18:28   MR HIP RIGHT WO CONTRAST  Result Date: 09/02/2020 CLINICAL DATA:  Fall, right hip pain, abnormal CT EXAM: MR OF THE RIGHT HIP WITHOUT CONTRAST TECHNIQUE: Multiplanar, multisequence MR imaging was performed. No intravenous contrast was administered. COMPARISON:  09/02/2020 FINDINGS: Bones: There is linear marrow edema within the intertrochanteric region of the proximal right femur, compatible with the nondisplaced fracture seen on recent CT. The subtle cortical discontinuity seen on CT is not apparent by MR. Remaining bony structures demonstrate normal signal characteristics. Articular cartilage and labrum Articular cartilage:  Mild diffuse articular cartilage thinning. Labrum:  No gross labral pathology. Joint or bursal effusion Joint effusion:  No evidence of joint effusion. Bursae: Unremarkable. Muscles and tendons Muscles and tendons: There is edema throughout the adductor musculature and quadratus femoris within the proximal right thigh. Minimal edema within the gluteal musculature. Other findings Miscellaneous: Intrapelvic structures are grossly unremarkable. Normal signal flow voids within the vasculature. IMPRESSION: 1. Marrow edema within the intertrochanteric region of the proximal right femur, compatible with nondisplaced fracture as seen on previous CT. 2. Edema within the adductor musculature, quadratus femoris, and gluteal musculature as above. Electronically Signed   By:  Randa Ngo M.D.   On: 09/02/2020 22:27   DG C-Arm 1-60 Min-No Report  Result Date: 09/03/2020 Fluoroscopy was utilized by the requesting physician.  No radiographic interpretation.   DG HIP OPERATIVE UNILAT WITH PELVIS RIGHT  Result Date: 09/03/2020 CLINICAL DATA:  Right hip fracture. EXAM: OPERATIVE right HIP (WITH PELVIS IF PERFORMED) 4 VIEWS TECHNIQUE: Fluoroscopic spot image(s) were submitted for interpretation post-operatively. Radiation exposure index: 3.6602 mGy. COMPARISON:  September 01, 2020. FINDINGS: Four intraoperative fluoroscopic images demonstrate surgical internal fixation of right proximal femoral fracture. Good alignment of fracture components is noted. IMPRESSION: Fluoroscopic guidance provided during right hip surgery. Electronically Signed   By: Marijo Conception M.D.   On: 09/03/2020 17:44     LOS: 1 day   Antonieta Pert, MD [facility]  09/04/2020, 10:30 AM

## 2020-09-04 NOTE — Consult Note (Signed)
[facility] Consult   Reason for Consult:  Delirium, dementia, Etoh use Referring Physician:  Dr. Lupita Leash Patient Identification: Jesus Jordan MRN:  [id number] Principal Diagnosis: Major neurocognitive disorder due to multiple etiologies without behavioral disturbance ([place]) Diagnosis:  Principal Problem:   Major neurocognitive disorder due to multiple etiologies without behavioral disturbance Active Problems:   Closed right hip fracture ([place])   Hip fracture ([place])   Total Time spent with patient: 30 minutes  Subjective:   Jesus Jordan is a 75 y.o. male patient admitted with hip fracture after a fall at home. Patient is seen and assessed by this nurse practitioner. He is very pleasant and brightens upon approach, he is observed to be lying in bed with an activity belt over his lap.  Patient states he fell and bumped his elbow, he denies any damage to his hip.  When assessing how patient feel he states" it was raining, when blowing very hard. "  Chart review indicates patient fell while playing a game of tennis, this was verified by his daughter.  Patient is able to identify his name, date of birth, location, and current president.  Patient does display intermittent episodes of confusion, however is easily redirected.  When assessing psychosis, hallucinations, and delusions he states "I have people taking care of me.  I do not really deal with that too much."  Patient denies any previous psychiatric history to include any psychiatric diagnosis, inpatient hospitalizations, outpatient psychiatric services, and or self-harm or suicide attempt.  Patient also denies any current alcohol use.  He denies any illicit substance use.  He denies any suicidal ideation, homicidal ideation, and or auditory or visual hallucination.  On today's evaluation patient is alert and oriented x4, calm and cooperative, very pleasant. Patient denies any access to weapons, denies any alcohol and or substance abuse.  He  denies receiving any current outpatient behavioral health resources, although chart review does indicate current neurology work-up for Lewy bodies dementia.  Recent neuropsychologist evaluation suggested fairly diffuse cognitive impairment, an MRI showed age-related atrophy.  He is currently being prescribed mirtazapine for appetite stimulation and Aricept for dementia.  Patient denies any auditory and/or visual hallucinations, does not appear to be responding to internal or external stimuli.  There is no evidence of delusional thought content and patient appears to answer most questions appropriately.    Collateral information obtained from his daughter [name]) who is able to confirm that her father is receiving neuropsychological evaluation, and upcoming neurology appointment for management of his dementia.  She states her father does use alcohol, and drinks about 6 beers a day.  She denies any acute withdrawal symptoms that she has observed.  She is also able to confirm that patient has been driving, and continuing to take care of himself up until most recently.  She verifies that he was playing tennis with a friend, prior to his fall that led to hip fracture.  She has no acute concerns regarding his overall psychiatric condition, however she expresses much concern regarding use of Ativan.  She states previously Ativan has made his agitation and aggression worse, as the medication begins to wear off. "  Initially he will respond to it in the beginning, however when the medication wears off he becomes really agitated, aggressive and confused."  She states she recently moved in about 2 months ago to live with her father, she has two other siblings however she is his sole caretaker.  HPI:  Patient is a 75 year old male with  past medical history of coronary artery disease with stent, COPD, hypertension, EtOH abuse and what daughter describes as "memory issues".  Patient presenting today for evaluation of a fall.   From what I am told he was playing tennis with friends earlier today.  He was running in an attempt to return to a ball when he twisted awkwardly, fell, and injured his right leg.  He is describing pain in his right hip.  He was unable to ambulate afterward and his friends carried him to the car and to his home.  His daughter brings him tonight due to the pain and inability to ambulate  Past Psychiatric History: Major neurocognitive disorder, ETOH use   Risk to Self:   Risk to Others:   Prior Inpatient Therapy:   Prior Outpatient Therapy:    Past Medical History:  Past Medical History:  Diagnosis Date  . Alcohol abuse, daily use    05/29/20 - Reported consuming a 6 pack per day  . CAD in native artery 05/03/2016  . COPD (chronic obstructive pulmonary disease) 12/10/2018  . Coronary atherosclerosis 01/29/2009  . Essential hypertension 12/10/2018  . GERD (gastroesophageal reflux disease) 04/18/2018  . Gout   . Hearing loss   . History of MI (myocardial infarction) 09/12/2008   large inferolateral MI secondary to total occlusion of circumflex with VF cardiac arrest  . History of nuclear stress test 02/23/2012   exercise myoview; area of scar in inferolateral wall at mid-basal region  . Hyperlipidemia 03/06/2013   target LDL less than 70  . Inguinal hernia 05/25/2009  . Major neurocognitive disorder due to multiple etiologies without behavioral disturbance 05/29/2020    Past Surgical History:  Procedure Laterality Date  . CORONARY STENT PLACEMENT  09/12/2008   r/t MI; 3.5x42mm Xience DES to circumflex & PTCA at multiple sites in distal region of vessel; alos diffusely diseaase, narrowed, nondominant RCA  . FOOT SURGERY  1960  . HERNIA REPAIR  2011   left inguinal hernia  . TRANSTHORACIC ECHOCARDIOGRAM  03/02/2009   WH=67-59%; LV systolic function borderline reduced with mild inferior wall hypokinesis; mild-mod MR; mild TR; AV mildly sclerotic; mild aortic root dilatation   Family  History:  Family History  Problem Relation Age of Onset  . Colon cancer Father   . Alcoholism Father   . Hyperlipidemia Brother   . Heart disease Brother 27       massive MI  . Raynaud syndrome Brother   . Cancer Paternal Grandfather        colon  . Dementia Sister        Unspecified type   Family Psychiatric  History: Denies Social History:  Social History   Substance and Sexual Activity  Alcohol Use Yes  . Alcohol/week: 42.0 standard drinks  . Types: 42 Cans of beer per week   Comment: 6 pack per day     Social History   Substance and Sexual Activity  Drug Use No    Social History   Socioeconomic History  . Marital status: Single    Spouse name: Not on file  . Number of children: 3  . Years of education: 29  . Highest education level: High school graduate  Occupational History  . Occupation: Retired    Comment: Development worker, international aid  Tobacco Use  . Smoking status: Current Some Day Smoker    Years: 30.00    Types: Cigarettes  . Smokeless tobacco: Never Used  . Tobacco comment: skip days some; pack last 3 days  Vaping Use  . Vaping Use: Never used  Substance and Sexual Activity  . Alcohol use: Yes    Alcohol/week: 42.0 standard drinks    Types: 42 Cans of beer per week    Comment: 6 pack per day  . Drug use: No  . Sexual activity: Not on file  Other Topics Concern  . Not on file  Social History Narrative  . Not on file   Social Determinants of Health   Financial Resource Strain: Low Risk   . Difficulty of Paying Living Expenses: Not very hard  Food Insecurity: Not on file  Transportation Needs: No Transportation Needs  . Lack of Transportation (Medical): No  . Lack of Transportation (Non-Medical): No  Physical Activity: Not on file  Stress: Not on file  Social Connections: Not on file   Additional Social History:    Allergies:   Allergies  Allergen Reactions  . Imdur [Isosorbide Nitrate] Other (See Comments)    Caused vertigo  . Lisinopril Other  (See Comments)    Possible vertigo??    Labs:  Results for orders placed or performed during the hospital encounter of 09/01/20 (from the past 48 hour(s))  CBC     Status: Abnormal   Collection Time: 09/02/20  5:14 PM  Result Value Ref Range   WBC 8.0 4.0 - 10.5 K/uL   RBC 4.32 4.22 - 5.81 MIL/uL   Hemoglobin 14.4 13.0 - 17.0 g/dL   HCT 42.8 39.0 - 52.0 %   MCV 99.1 80.0 - 100.0 fL   MCH 33.3 26.0 - 34.0 pg   MCHC 33.6 30.0 - 36.0 g/dL   RDW 14.0 11.5 - 15.5 %   Platelets 139 (L) 150 - 400 K/uL   nRBC 0.0 0.0 - 0.2 %    Comment: Performed at [facility], [place] [address], [place], [place] [address]  VITAMIN D 25 Hydroxy (Vit-D Deficiency, Fractures)     Status: None   Collection Time: 09/02/20  5:14 PM  Result Value Ref Range   Vit D, 25-Hydroxy 35.85 30 - 100 ng/mL    Comment: (NOTE) Vitamin D deficiency has been defined by the [place] practice guideline as a level of serum 25-OH  vitamin D less than 20 ng/mL (1,2). The Endocrine Society went on to  further define vitamin D insufficiency as a level between 21 and 29  ng/mL (2).  1. IOM (Institute of Medicine). 2010. Dietary reference intakes for  calcium and D. [place]: The [employer]. 2. Holick MF, Binkley Amidon, Bischoff-Ferrari HA, et al. Evaluation,  treatment, and prevention of vitamin D deficiency: an Endocrine  Society clinical practice guideline, JCEM. 2011 Jul; 96(7): 1911-30.  Performed at [facility], [place] [address] [place], [place] [address]   Comprehensive metabolic panel     Status: Abnormal   Collection Time: 09/02/20  5:14 PM  Result Value Ref Range   Sodium 137 135 - 145 mmol/L   Potassium 3.9 3.5 - 5.1 mmol/L   Chloride 104 98 - 111 mmol/L   CO2 24 22 - 32 mmol/L   Glucose, Bld 97 70 - 99 mg/dL    Comment: Glucose reference range applies only to samples taken after fasting for at least 8 hours.   BUN 15 8 - 23 mg/dL    Creatinine, Ser 0.81 0.61 - 1.24 mg/dL   Calcium 9.2 8.9 - 10.3 mg/dL   Total Protein 7.1 6.5 - 8.1  g/dL   Albumin 4.1 3.5 - 5.0 g/dL   AST 28 15 - 41 U/L   ALT 20 0 - 44 U/L   Alkaline Phosphatase 64 38 - 126 U/L   Total Bilirubin 1.3 (H) 0.3 - 1.2 mg/dL   GFR, Estimated >60 >60 mL/min    Comment: (NOTE) Calculated using the CKD-EPI Creatinine Equation (2021)    Anion gap 9 5 - 15    Comment: Performed at [facility], [place] [address], [place], [place] [address]  Magnesium     Status: None   Collection Time: 09/02/20  5:14 PM  Result Value Ref Range   Magnesium 2.0 1.7 - 2.4 mg/dL    Comment: Performed at [facility], [place] [address] [place], [place] [address]  Phosphorus     Status: None   Collection Time: 09/02/20  5:14 PM  Result Value Ref Range   Phosphorus 3.0 2.5 - 4.6 mg/dL    Comment: Performed at [facility], [place] [address], [place], [place] [address]  MRSA PCR Screening     Status: None   Collection Time: 09/03/20 11:06 AM   Specimen: Nasal Mucosa; Nasopharyngeal  Result Value Ref Range   MRSA by PCR NEGATIVE NEGATIVE    Comment:        The GeneXpert MRSA Assay (FDA approved for NASAL specimens only), is one component of a comprehensive MRSA colonization surveillance program. It is not intended to diagnose MRSA infection nor to guide or monitor treatment for MRSA infections. Performed at [facility], [place] [address], [place], [place] [address]   CBC     Status: Abnormal   Collection Time: 09/03/20  8:43 PM  Result Value Ref Range   WBC 10.3 4.0 - 10.5 K/uL   RBC 4.30 4.22 - 5.81 MIL/uL   Hemoglobin 14.3 13.0 - 17.0 g/dL   HCT 43.0 39.0 - 52.0 %   MCV 100.0 80.0 - 100.0 fL   MCH 33.3 26.0 - 34.0 pg   MCHC 33.3 30.0 - 36.0 g/dL   RDW 13.9 11.5 - 15.5 %   Platelets 121 (L) 150 - 400 K/uL   nRBC 0.0 0.0 - 0.2 %    Comment: Performed at [facility], [place]  [address] [place], [place] [address]  Creatinine, serum     Status: None   Collection Time: 09/03/20  8:43 PM  Result Value Ref Range   Creatinine, Ser 0.80 0.61 - 1.24 mg/dL   GFR, Estimated >60 >60 mL/min    Comment: (NOTE) Calculated using the CKD-EPI Creatinine Equation (2021) Performed at [facility], [place] [address] [place], [place] [address]   Basic metabolic panel     Status: Abnormal   Collection Time: 09/04/20  5:22 AM  Result Value Ref Range   Sodium 136 135 - 145 mmol/L   Potassium 4.0 3.5 - 5.1 mmol/L   Chloride 103 98 - 111 mmol/L   CO2 24 22 - 32 mmol/L   Glucose, Bld 135 (H) 70 - 99 mg/dL    Comment: Glucose reference range applies only to samples taken after fasting for at least 8 hours.   BUN 17 8 - 23 mg/dL   Creatinine, Ser 0.79 0.61 - 1.24 mg/dL   Calcium 9.0 8.9 - 10.3 mg/dL   GFR, Estimated >60 >60 mL/min    Comment: (NOTE) Calculated using the CKD-EPI Creatinine Equation (2021)    Anion gap 9 5 - 15  Comment: Performed at [facility] [place] [address], [place], [place] [address]  CBC     Status: Abnormal   Collection Time: 09/04/20  5:22 AM  Result Value Ref Range   WBC 11.5 (H) 4.0 - 10.5 K/uL   RBC 3.93 (L) 4.22 - 5.81 MIL/uL   Hemoglobin 12.9 (L) 13.0 - 17.0 g/dL   HCT 38.5 (L) 39.0 - 52.0 %   MCV 98.0 80.0 - 100.0 fL   MCH 32.8 26.0 - 34.0 pg   MCHC 33.5 30.0 - 36.0 g/dL   RDW 13.6 11.5 - 15.5 %   Platelets 127 (L) 150 - 400 K/uL    Comment: Immature Platelet Fraction may be clinically indicated, consider ordering this additional test [id number] CONSISTENT WITH PREVIOUS RESULT REPEATED TO VERIFY    nRBC 0.0 0.0 - 0.2 %    Comment: Performed at [facility], [place] [address] [place], [place] [address]  TSH     Status: None   Collection Time: 09/04/20  5:22 AM  Result Value Ref Range   TSH 0.402 0.350 - 4.500 uIU/mL    Comment: Performed by a 3rd Generation assay with a functional  sensitivity of <=0.01 uIU/mL. Performed at [facility], [name] [address], [place], [place] [address]   Vitamin B12     Status: None   Collection Time: 09/04/20  5:22 AM  Result Value Ref Range   Vitamin B-12 324 180 - 914 pg/mL    Comment: (NOTE) This assay is not validated for testing neonatal or myeloproliferative syndrome specimens for Vitamin B12 levels. Performed at [facility], [place] [address], [place], [place] [address]   Ammonia     Status: Abnormal   Collection Time: 09/04/20  8:19 AM  Result Value Ref Range   Ammonia <9 (L) 9 - 35 umol/L    Comment: REPEATED TO VERIFY Performed at [facility], [place] [address], [place], [place] [address]   Urinalysis, Routine w reflex microscopic Urine, Random     Status: Abnormal   Collection Time: 09/04/20  8:49 AM  Result Value Ref Range   Color, Urine YELLOW YELLOW   APPearance CLEAR CLEAR   Specific Gravity, Urine 1.011 1.005 - 1.030   pH 5.0 5.0 - 8.0   Glucose, UA NEGATIVE NEGATIVE mg/dL   Hgb urine dipstick SMALL (A) NEGATIVE   Bilirubin Urine NEGATIVE NEGATIVE   Ketones, ur 5 (A) NEGATIVE mg/dL   Protein, ur NEGATIVE NEGATIVE mg/dL   Nitrite NEGATIVE NEGATIVE   Leukocytes,Ua NEGATIVE NEGATIVE   WBC, UA 0-5 0 - 5 WBC/hpf   Bacteria, UA RARE (A) NONE SEEN   Mucus PRESENT    Sperm, UA PRESENT     Comment: Performed at [facility], [place] [address] [place],  [address]    Current Facility-Administered Medications  Medication Dose Route Frequency Provider Last Rate Last Admin  . acetaminophen (TYLENOL) tablet 325-650 mg  325-650 mg Oral Q6H PRN Swinteck, Aaron Edelman, MD      . acetaminophen (TYLENOL) tablet 500 mg  500 mg Oral Q6H Swinteck, Aaron Edelman, MD   500 mg at 09/04/20 8127  . allopurinol (ZYLOPRIM) tablet 300 mg  300 mg Oral Daily Rod Can, MD   300 mg at 09/04/20 0841  . aspirin chewable tablet 81 mg  81 mg Oral QHS Rod Can, MD   81  mg at 09/03/20 2156  . docusate sodium (COLACE) capsule 100 mg  100 mg Oral  BID Rod Can, MD   100 mg at 09/04/20 0841  . donepezil (ARICEPT) tablet 10 mg  10 mg Oral QHS Rod Can, MD   10 mg at 09/03/20 2157  . doxycycline (VIBRA-TABS) tablet 100 mg  100 mg Oral Daily Rod Can, MD   100 mg at 09/04/20 0841  . enoxaparin (LOVENOX) injection 40 mg  40 mg Subcutaneous Q24H Rod Can, MD   40 mg at 09/04/20 0841  . ezetimibe (ZETIA) tablet 10 mg  10 mg Oral QHS Rod Can, MD   10 mg at 09/03/20 2157  . feeding supplement (ENSURE SURGERY) liquid 237 mL  237 mL Oral BID BM Kc, Ramesh, MD      . folic acid (FOLVITE) tablet 1 mg  1 mg Oral Daily Swinteck, Aaron Edelman, MD   1 mg at 09/04/20 0841  . HYDROcodone-acetaminophen (NORCO/VICODIN) 5-325 MG per tablet 1-2 tablet  1-2 tablet Oral Q6H PRN Rod Can, MD   2 tablet at 09/04/20 0403  . menthol-cetylpyridinium (CEPACOL) lozenge 3 mg  1 lozenge Oral PRN Swinteck, Aaron Edelman, MD       Or  . phenol (CHLORASEPTIC) mouth spray 1 spray  1 spray Mouth/Throat PRN Swinteck, Aaron Edelman, MD      . methocarbamol (ROBAXIN) tablet 500 mg  500 mg Oral Q6H PRN Swinteck, Aaron Edelman, MD       Or  . methocarbamol (ROBAXIN) 500 mg in dextrose 5 % 50 mL IVPB  500 mg Intravenous Q6H PRN Swinteck, Aaron Edelman, MD      . metoCLOPramide (REGLAN) tablet 5-10 mg  5-10 mg Oral Q8H PRN Swinteck, Aaron Edelman, MD       Or  . metoCLOPramide (REGLAN) injection 5-10 mg  5-10 mg Intravenous Q8H PRN Swinteck, Aaron Edelman, MD      . metoprolol succinate (TOPROL-XL) 24 hr tablet 25 mg  25 mg Oral Daily Rod Can, MD   25 mg at 09/04/20 0841  . mirtazapine (REMERON) tablet 15 mg  15 mg Oral QHS Rod Can, MD   15 mg at 09/03/20 2157  . morphine 2 MG/ML injection 0.5 mg  0.5 mg Intravenous Q2H PRN Swinteck, Aaron Edelman, MD      . morphine 2 MG/ML injection 0.5-1 mg  0.5-1 mg Intravenous Q2H PRN Rod Can, MD   1 mg at 09/03/20 2351  . multivitamin with minerals tablet 1 tablet   1 tablet Oral Daily Swinteck, Aaron Edelman, MD   1 tablet at 09/04/20 0841  . naphazoline-glycerin (CLEAR EYES REDNESS) ophth solution 1 drop  1 drop Both Eyes QID PRN Rod Can, MD      . ondansetron (ZOFRAN) tablet 4 mg  4 mg Oral Q6H PRN Swinteck, Aaron Edelman, MD       Or  . ondansetron (ZOFRAN) injection 4 mg  4 mg Intravenous Q6H PRN Swinteck, Aaron Edelman, MD      . polyethylene glycol (MIRALAX / GLYCOLAX) packet 17 g  17 g Oral Daily PRN Swinteck, Aaron Edelman, MD      . simvastatin (ZOCOR) tablet 40 mg  40 mg Oral q1800 Rod Can, MD   40 mg at 09/02/20 1715  . thiamine tablet 100 mg  100 mg Oral Daily Rod Can, MD   100 mg at 09/04/20 2130   Or  . thiamine (B-1) injection 100 mg  100 mg Intravenous Daily Rod Can, MD   100 mg at 09/03/20 1100  . traMADol (ULTRAM) tablet 50 mg  50 mg Oral Q6H PRN Rod Can, MD   50 mg at 09/03/20  2156    Musculoskeletal: Strength & Muscle Tone: within normal limits Gait & Station: normal Patient leans: N/A            Psychiatric Specialty Exam:  Presentation  General Appearance: Appropriate for Environment; Casual  Eye Contact:Good  Speech:Clear and Coherent; Slow  Speech Volume:Normal  Handedness:Right   Mood and Affect  Mood:Euthymic  Affect:Appropriate; Congruent   Thought Process  Thought Processes:Irrevelant; Disorganized; Coherent  Descriptions of Associations:Intact  Orientation:Full (Time, Place and Person)  Thought Content:Rumination; Scattered; Logical  History of Schizophrenia/Schizoaffective disorder:No data recorded Duration of Psychotic Symptoms:No data recorded Hallucinations:Hallucinations: None  Ideas of Reference:None  Suicidal Thoughts:Suicidal Thoughts: No  Homicidal Thoughts:Homicidal Thoughts: No   Sensorium  Memory:Immediate Fair; Recent Fair; Remote Fair  Judgment:Poor  Insight:Lacking   Executive Functions  Concentration:Fair  Attention  Span:Fair  [place]   Psychomotor Activity  Psychomotor Activity:Psychomotor Activity: Restlessness   Assets  Assets:Physical Health; Resilience; Social Support   Sleep  Sleep:Sleep: Poor   Physical Exam: Physical Exam Constitutional:      Appearance: Normal appearance. He is normal weight.  HENT:     Head: Normocephalic.  Neurological:     General: No focal deficit present.     Mental Status: He is alert and oriented to person, place, and time. Mental status is at baseline.  Psychiatric:        Attention and Perception: Attention normal.        Mood and Affect: Mood normal.        Speech: Speech normal.        Behavior: Behavior normal.        Thought Content: Thought content normal.        Cognition and Memory: Cognition is impaired. Memory is impaired.        Judgment: Judgment normal.    ROS Blood pressure 130/84, pulse 76, temperature 97.6 F (36.4 C), temperature source Oral, resp. rate 20, height 5\' 10"  (1.778 m), weight 79.4 kg, SpO2 97 %. Body mass index is 25.12 kg/m.  Treatment Plan Summary: Plan Will recommend CIWA protocol. Consider libirum 25mg  po TID prn for alcohol withdrawl related symptoms.  Will recommend limiting use of benzodiazepines as patient may experience paradoxical effects that results and increasing agitation and aggression, worsening confusion, and increased risk for falls.  In terms of his dementia he is currently receiving appropriate outpatient services by neurology and neuropsych.  He currently has neurocognitive evaluation pending, to include work-up for Lewy bodies dementia.  Will add Seroquel 25 mg p.o. nightly as needed for agitation.  Patient is at high risk for developing delirium, will recommend initiating delirium precautions.  Patient is also exhibiting intermittent periods of confusion and agitation, recommend one-to-one safety sitter when appropriate.  Disposition: No evidence of  imminent risk to self or others at present.   Patient does not meet criteria for psychiatric inpatient admission. Supportive therapy provided about ongoing stressors. Discussed crisis plan, support from social network, calling 911, coming to the Emergency Department, and calling Suicide Hotline.  Suella Broad, FNP 09/04/2020 12:34 PM

## 2020-09-04 NOTE — Evaluation (Signed)
Physical Therapy Evaluation Patient Details Name: Beniah Magnan MRN: [id number] DOB: 1946/01/05 Today's Date: 09/04/2020   History of Present Illness  Rodrigues Urbanek is a 75 y.o. male with medical history significant of EtOH abuse, HTN, GERD, COPD, MI 2010 with current c/o hip pain after fall while playing tennis. CY and MRI identified intertrochanteric femur fracture, s/p R IM nail 09/03/20.  Clinical Impression  Pt admitted with above diagnosis. Pt pleasantly confused, able to recall recent fall in detail but states surgery still needs to be done. Pt tangential, requires redirection and increased time and cues for sequencing and safety. Pt is strong per MMT and functionally able to rise to stand and ambulate with RW and min G for steadying and safety. Unsure of baseline functioning or home set up, currently recommending 24 hr assist due to confusion and slightly impulsive behavior, per notes pt's daughter living with him. Also recommending OPPT for strengthening, high level balance and return to tennis sport with friends. Will need RW if doesn't have one at home- will continue to assess. Pt currently with functional limitations due to the deficits listed below (see PT Problem List). Pt will benefit from skilled PT to increase their independence and safety with mobility to allow discharge to the venue listed below.       Follow Up Recommendations Outpatient PT;Supervision/Assistance - 24 hour    Equipment Recommendations  Other (comment) (TBD pending home equipment)    Recommendations for Other Services       Precautions / Restrictions Precautions Precautions: Fall Restrictions Weight Bearing Restrictions: No      Mobility  Bed Mobility Overal bed mobility: Needs Assistance Bed Mobility: Supine to Sit;Sit to Supine  Supine to sit: Supervision Sit to supine: Supervision   General bed mobility comments: SUPV for safety, attempting to exit bedside with bedrail raised instead of side with  bedrail lowered, cues for sequencing and safety, slightly increased time    Transfers Overall transfer level: Needs assistance Equipment used: Rolling walker (2 wheeled) Transfers: Sit to/from Stand Sit to Stand: Min guard    General transfer comment: min G to steady, attempts to rise with BLE braced against bed and no RW twice, educated on safety, min G for safety when rising with RW, cues for hand placement  Ambulation/Gait Ambulation/Gait assistance: Min guard Gait Distance (Feet): 20 Feet Assistive device: Rolling walker (2 wheeled) Gait Pattern/deviations: Step-through pattern;Decreased stride length Gait velocity: decreased   General Gait Details: initial decreased R weight-shift that improves with time, pt appears to being trying to keep on beat with the music playing and couting 1-6 with steps, able to redirect to ambulation, pt pushes RW aside and takes step without RW so educated on safety, 1 minor LOB with turning using RW and pt able to recover without physical assistance, smiling and conversational throughout  Stairs            Wheelchair Mobility    Modified Rankin (Stroke Patients Only)       Balance Overall balance assessment: Needs assistance Sitting-balance support: Feet supported;No upper extremity supported Sitting balance-Leahy Scale: Good Sitting balance - Comments: seated EOB   Standing balance support: During functional activity Standing balance-Leahy Scale: Poor Standing balance comment: unsteady without RW, improved balance with RW        Pertinent Vitals/Pain Pain Assessment: Faces Faces Pain Scale: Hurts little more Pain Location: R thigh with movement Pain Descriptors / Indicators: Grimacing;Discomfort Pain Intervention(s): Limited activity within patient's tolerance;Monitored during session;Repositioned    Home  Living Family/patient expects to be discharged to:: Private residence Living Arrangements: Children Available Help at  Discharge: Family  Additional Comments: Unsure, no family present during eval and pt unable to answer questions.    Prior Function   Comments: Unsure, per chart review pt's daughter moved in with him and he recently fell while playing tennis with friends. Pt unable to answer PLOF/home setup questions.     Hand Dominance        Extremity/Trunk Assessment   Upper Extremity Assessment Upper Extremity Assessment: Overall WFL for tasks assessed    Lower Extremity Assessment Lower Extremity Assessment: Overall WFL for tasks assessed;RLE deficits/detail RLE Deficits / Details: AROM WNL throughout, hip/knee strength 4/5, ankle strength 5/5 RLE Sensation: WNL    Cervical / Trunk Assessment Cervical / Trunk Assessment: Normal  Communication   Communication: No difficulties  Cognition Arousal/Alertness: Awake/alert Behavior During Therapy: WFL for tasks assessed/performed;Impulsive Overall Cognitive Status: No family/caregiver present to determine baseline cognitive functioning  General Comments: Pt with some tangential speech, requires redirection to task, increased time to follow single step commands, perseverates on playing tennis and listening to [place], somewhat easy to redirect, slightly impuslive trying to stand up and mobilize around room despite cues.      General Comments      Exercises     Assessment/Plan    PT Assessment Patient needs continued PT services  PT Problem List Decreased activity tolerance;Decreased balance;Decreased cognition;Decreased knowledge of use of DME;Decreased safety awareness;Pain       PT Treatment Interventions DME instruction;Gait training;Stair training;Functional mobility training;Therapeutic activities;Therapeutic exercise;Balance training;Cognitive remediation;Patient/family education;Modalities    PT Goals (Current goals can be found in the Care Plan section)  Acute Rehab PT Goals Patient Stated Goal: "play tennis" PT Goal  Formulation: With patient Time For Goal Achievement: 09/18/20 Potential to Achieve Goals: Good    Frequency Min 3X/week   Barriers to discharge        Co-evaluation               AM-PAC PT "6 Clicks" Mobility  Outcome Measure Help needed turning from your back to your side while in a flat bed without using bedrails?: None Help needed moving from lying on your back to sitting on the side of a flat bed without using bedrails?: None Help needed moving to and from a bed to a chair (including a wheelchair)?: A Little Help needed standing up from a chair using your arms (e.g., wheelchair or bedside chair)?: A Little Help needed to walk in hospital room?: A Little Help needed climbing 3-5 steps with a railing? : A Little 6 Click Score: 20    End of Session Equipment Utilized During Treatment: Gait belt Activity Tolerance: Patient tolerated treatment well Patient left: in bed;with call bell/phone within reach;with nursing/sitter in room Nurse Communication: Mobility status PT Visit Diagnosis: Unsteadiness on feet (R26.81);Other abnormalities of gait and mobility (R26.89);Pain Pain - Right/Left: Right Pain - part of body: Hip    Time: 9458-5929 PT Time Calculation (min) (ACUTE ONLY): 15 min   Charges:   PT Evaluation $PT Eval Low Complexity: 1 Low           Tori Lusero Nordlund PT, DPT 09/04/20, 1:07 PM

## 2020-09-04 NOTE — Progress Notes (Signed)
    Subjective:  Patient reports pain as mild.  Denies N/V/CP/SOB. Patient is resting comfortably in bed, pleasantly confused, with moments where he is oriented to his recent fracture. Per nursing, patient has been intermittently agitated, taking off his clothes, and removing bandages  Objective:   VITALS:   Vitals:   09/03/20 1815 09/03/20 2018 09/03/20 2342 09/04/20 0357  BP: (!) 145/82 120/71 118/75 130/84  Pulse: 81 84 83 76  Resp: 14 18 20 20   Temp:  98.1 F (36.7 C) 97.6 F (36.4 C) 97.6 F (36.4 C)  TempSrc:  Oral Oral Oral  SpO2: 100% 96% 95% 97%  Weight:      Height:        NAD ABD soft Neurovascular intact Sensation intact distally Intact pulses distally Dorsiflexion/Plantar flexion intact Incision: dressing C/D/I and superior aquacell changed to mepilex due to patient's removal of bandage   Lab Results  Component Value Date   WBC 11.5 (H) 09/04/2020   HGB 12.9 (L) 09/04/2020   HCT 38.5 (L) 09/04/2020   MCV 98.0 09/04/2020   PLT 127 (L) 09/04/2020   BMET    Component Value Date/Time   NA 136 09/04/2020 0522   K 4.0 09/04/2020 0522   CL 103 09/04/2020 0522   CO2 24 09/04/2020 0522   GLUCOSE 135 (H) 09/04/2020 0522   BUN 17 09/04/2020 0522   CREATININE 0.79 09/04/2020 0522   CREATININE 0.90 02/10/2020 1148   CALCIUM 9.0 09/04/2020 0522   GFRNONAA >60 09/04/2020 0522   GFRAA >60 08/28/2019 1554     Assessment/Plan: 1 Day Post-Op   Active Problems:   Closed right hip fracture (HCC)   Hip fracture (HCC)   WBAT with walker DVT ppx: Aspirin and Lovenox, SCDs, TEDS  ASA 81mg  BID at DC PO pain control, try to avoid narcotics unless strongly needed. Tramadol and tylenol preferred. Tramadol Rx at DC PT/OT Dispo: Pending    Dorothyann Peng 09/04/2020, 12:33 PM [facility] is now [facility] [address] [place], [place], [place] [address] Phone: [phone] [email or web address] Facebook  [employer]

## 2020-09-04 NOTE — TOC Initial Note (Signed)
Transition of Care [facility]) - Initial/Assessment Note    Patient Details  Name: Jesus Jordan MRN: [id number] Date of Birth: 10/22/1945  Transition of Care [facility] CM/SW Contact:    Lynnell Catalan, RN Phone Number: 09/04/2020, 2:54 PM  Clinical Narrative:                 Pt from home with daughter. Called daughter for dc planning as pt is confused. Went over physical therapy recommendations of Outpatient PT/Supervision 24hr. Daughter states that pt has dementia and has been more confused since in the hospital. Pt has a hx of 6 pack of beer per day. Daughter requesting RW and 3in1 for home. [employer] liaison contacted for DME. DME will be delivered to pt room prior to dc.  Expected Discharge Plan: Home/Self Care Barriers to Discharge: Continued Medical Work up  Expected Discharge Plan and Services Expected Discharge Plan: Home/Self Care   Discharge Planning Services: CM Consult   Living arrangements for the past 2 months: Single Family Home                     Prior Living Arrangements/Services Living arrangements for the past 2 months: Single Family Home Lives with:: Adult Children                   Activities of Daily Living Home Assistive Devices/Equipment: Eyeglasses ADL Screening (condition at time of admission) Patient's cognitive ability adequate to safely complete daily activities?: Yes Is the patient deaf or have difficulty hearing?: No Does the patient have difficulty seeing, even when wearing glasses/contacts?: No Does the patient have difficulty concentrating, remembering, or making decisions?: Yes Patient able to express need for assistance with ADLs?: Yes Does the patient have difficulty dressing or bathing?: Yes Independently performs ADLs?: No Communication: Independent Dressing (OT): Needs assistance Is this a change from baseline?: Change from baseline, expected to last >3 days Grooming: Needs assistance Is this a change from baseline?: Change from baseline,  expected to last >3 days Feeding: Needs assistance Is this a change from baseline?: Change from baseline, expected to last >3 days Bathing: Needs assistance Is this a change from baseline?: Change from baseline, expected to last >3 days Toileting: Dependent Is this a change from baseline?: Change from baseline, expected to last >3days In/Out Bed: Dependent Is this a change from baseline?: Change from baseline, expected to last >3 days Walks in Home: Dependent Is this a change from baseline?: Change from baseline, expected to last >3 days Does the patient have difficulty walking or climbing stairs?: Yes Weakness of Legs: Right Weakness of Arms/Hands: None      Emotional Assessment   Attitude/Demeanor/Rapport: Charismatic Affect (typically observed): Restless        Admission diagnosis:  CAD in native artery [I25.10] Closed right hip fracture ([place]) [S72.001A] Closed fracture of right hip, initial encounter ([place]) [S72.001A] Hip fracture ([place]) [S72.009A] Patient Active Problem List   Diagnosis Date Noted  . Hip fracture ([place]) 09/03/2020  . Closed right hip fracture ([place]) 09/02/2020  . Major neurocognitive disorder due to multiple etiologies without behavioral disturbance 05/29/2020  . Alcohol abuse, daily use   . Hearing loss   . COPD (chronic obstructive pulmonary disease) 12/10/2018  . Essential hypertension 12/10/2018  . Chest pain 12/09/2018  . GERD (gastroesophageal reflux disease) 04/18/2018  . CAD in native artery 05/03/2016  . Hyperlipidemia with target LDL less than 70 03/06/2013  . Inguinal hernia 05/25/2009  . Gout 01/29/2009  . Coronary atherosclerosis  01/29/2009  . History of MI (myocardial infarction) 09/12/2008   PCP:  Eulas Post, MD Pharmacy:   [place], [place] [address] Phone: [phone] Fax: [phone]  [facility] - [place], [place] - [address] [place] [address] Phone: [phone] Fax: [phone]     Social Determinants of Health (SDOH) Interventions    Readmission Risk Interventions No flowsheet data found.

## 2020-09-05 DIAGNOSIS — S72001A Fracture of unspecified part of neck of right femur, initial encounter for closed fracture: Secondary | ICD-10-CM | POA: Diagnosis not present

## 2020-09-05 LAB — BASIC METABOLIC PANEL
Anion gap: 8 (ref 5–15)
BUN: 22 mg/dL (ref 8–23)
CO2: 28 mmol/L (ref 22–32)
Calcium: 9.3 mg/dL (ref 8.9–10.3)
Chloride: 103 mmol/L (ref 98–111)
Creatinine, Ser: 0.85 mg/dL (ref 0.61–1.24)
GFR, Estimated: >60 mL/min (ref >60)
Glucose, Bld: 129 mg/dL — ABNORMAL HIGH (ref 70–99)
Potassium: 3.6 mmol/L (ref 3.5–5.1)
Sodium: 139 mmol/L (ref 135–145)

## 2020-09-05 LAB — CBC
HCT: 39.3 % (ref 39.0–52.0)
Hemoglobin: 13 g/dL (ref 13.0–17.0)
MCH: 32.7 pg (ref 26.0–34.0)
MCHC: 33.1 g/dL (ref 30.0–36.0)
MCV: 99 fL (ref 80.0–100.0)
Platelets: 130 10*3/uL — ABNORMAL LOW (ref 150–400)
RBC: 3.97 MIL/uL — ABNORMAL LOW (ref 4.22–5.81)
RDW: 13.7 % (ref 11.5–15.5)
WBC: 9.7 10*3/uL (ref 4.0–10.5)
nRBC: 0 % (ref 0.0–0.2)

## 2020-09-05 MED ORDER — LORAZEPAM 2 MG/ML IJ SOLN: 0.5000 mg | INTRAMUSCULAR | Status: DC | PRN

## 2020-09-05 MED ORDER — HYDROXYZINE HCL 50 MG/ML IM SOLN
50.0000 mg | Freq: Once | INTRAMUSCULAR | Status: AC
  Administered 2020-09-05: 50 mg via INTRAMUSCULAR
  Filled 2020-09-05: qty 1

## 2020-09-05 MED ORDER — CHLORDIAZEPOXIDE HCL 5 MG PO CAPS
25.0000 mg | ORAL_CAPSULE | Freq: Three times a day (TID) | ORAL | Status: DC
  Administered 2020-09-05 – 2020-09-07 (×7): 25 mg via ORAL
  Filled 2020-09-05 (×7): qty 5

## 2020-09-05 NOTE — Progress Notes (Signed)
Patient found continually attempting to jump out of bed throughout the night despite interventions. Patient found approximately around 0600 out of bed and at the sink. Patient agitated and aggressive. [place], NP notified and placed restraint order. This RN spoke with patient's daughter, Jesus Jordan about the new restraint order. [name], Jesus Jordan spoke in agreement with restraint intervention. Patient currently in bed with restraints safely applied. Patient has been offered food and water and external catheter in place for elimination.

## 2020-09-05 NOTE — Progress Notes (Signed)
Subjective: 2 Days Post-Op Procedure(s) (LRB): INTRAMEDULLARY (IM) NAIL FEMORAL (Right)  Patient reports pain as mild to moderate.  Patient agitated and confused.  Talking to folks that he apparently knows who are not actually present in the room.  Objective:   VITALS:  Temp:  [98 F (36.7 C)] 98 F (36.7 C) (03/19 0453) Pulse Rate:  [73-79] 73 (03/19 0453) Resp:  [16-18] 16 (03/19 0453) BP: (114-130)/(68-85) 130/74 (03/19 0453) SpO2:  [98 %-99 %] 99 % (03/19 0453)  General: WDWN patient in NAD. Psych:  Appropriate mood and affect. Neuro:  A&O x 3, Moving all extremities, sensation intact to light touch HEENT:  EOMs intact Chest:  Even non-labored respirations Skin:  Dressing C/D/I, no rashes or lesions Extremities: warm/dry, mild edema to R thigh, no erythema or echymosis.  No lymphadenopathy. Pulses: Popliteus 2+ MSK:  ROM: TKE, R hip flexion > 90 degrees, MMT: able to perform quad set, (-) Homan's    LABS Recent Labs    09/02/20 1714 09/03/20 2043 09/04/20 0522 09/05/20 0538  HGB 14.4 14.3 12.9* 13.0  WBC 8.0 10.3 11.5* 9.7  PLT 139* 121* 127* 130*   Recent Labs    09/04/20 0522 09/05/20 0538  NA 136 139  K 4.0 3.6  CL 103 103  CO2 24 28  BUN 17 22  CREATININE 0.79 0.85  GLUCOSE 135* 129*   No results for input(s): LABPT, INR in the last 72 hours.   Assessment/Plan: 2 Days Post-Op Procedure(s) (LRB): INTRAMEDULLARY (IM) NAIL FEMORAL (Right)  Patient seen in rounds for Dr. Lyla Glassing. WBAT with rolling walker DVT ppx:  ASA and lovenox.  Transition to 81mg  ASA BID at D/C Tramadol at D/C for pain.  Up with therapy Disp:  SNF vs HH PT Plan for outpatient post-op visit with Dr. Riley Lam PA-C [facility] Office:  [phone]

## 2020-09-05 NOTE — Plan of Care (Signed)
?  Problem: Activity: ?Goal: Risk for activity intolerance will decrease ?Outcome: Progressing ?  ?Problem: Nutrition: ?Goal: Adequate nutrition will be maintained ?Outcome: Progressing ?  ?Problem: Elimination: ?Goal: Will not experience complications related to urinary retention ?Outcome: Progressing ?  ?Problem: Pain Managment: ?Goal: General experience of comfort will improve ?Outcome: Progressing ?  ?

## 2020-09-05 NOTE — Progress Notes (Signed)
PROGRESS NOTE    Jesus Jordan  HEN:[id number] DOB: 1946-04-07 DOA: 09/01/2020 PCP: Eulas Post, MD   Chief Complaint  Patient presents with  . Fall    R. Leg Pain  Brief Narrative: 75 year old male with history of hypertension GERD hyperlipidemia, dementia history of alcohol abuse, has a daughter is a caregiver, very functional at baseline with short-term memory issues, brought to the ED after a fall while he was playing tennis 3/15. In the ED imaging concerning for hip fracture Ortho was consulted and MRI was ordered-which confirmed fracture. He received ativan w/ ciwa and was deeply sedated- ativan stopped. Seen by orthopedic underwent right intertrochanteric IM fixation 3/17. 3/18 seen by psych- librium 10 mg tid and tid prn ordered.  Subjective: Seen this morning overnight patient was agitated confused did not sleep.   Alert awake oriented to self, daughter-no tremors sweating tachycardia. Daughter at bedside  Librium was increased 25 mg and patient reassessed and he was sleeping comfortably, with private caregiver  RN at bedside in afternoon.  Assessment & Plan:  Nondisplaced intertrochanteric fracture right femur 2/2 mechanical fall: s/p  right IM fixation 3/17.Continue DVT prophylaxis with Lovenox here, continue pain management. Defer pain management rx and DVT prophylaxis rx for outpatient to orthopedics team.  Continue to work with PT OT, vitamin D level is stable.  Acute metabolic encephalopathy/delirium multifactorial suspect alcohol withdrawal given his significant alcohol use history and 2/2 his Dementia, post hip fracture surgery/anesthesia/ hospitalization- TSH B12 normal, B1 pending,No evidence of UTI in the urine, baseline with short-term memory issues and at times hallucination: Patient was deeply sedated with Ativan on admission. He did relatively well after anesthesia and was more coherent.  Overnight agitated.  Increase Librium to 25 mg 3 times daily, added  low-dose Ativan 0.1 mg IV for agitation.  With increased Librium to as he seems to be responding well.  Continue the same and can wean down bid tomorrow or so. Daughter confirmed he drinks 6 packs of beer daily. Appreciate psych team evaluation and follow-up.  Continues Remeron, Seroquel was added as needed. continue multivitamin folate thiamine.    CAD remote history of a stent/Hyperlipidemia: Continue his aspirin beta-blocker, Zetia and statin.  Hypertension: BP stable continue metoprolol  GERD:continue PPI  Diet Order            Diet regular Room service appropriate? Yes; Fluid consistency: Thin  Diet effective now                 Patient's Body mass index is 25.12 kg/m.  DVT prophylaxis: enoxaparin (LOVENOX) injection 40 mg Start: 09/04/20 0800 SCDs Start: 09/03/20 1841. Code Status:   Code Status: Partial Code DNI, okay for CPR. Family Communication: plan of care discussed with patient's daughter at the bedside.    Status is: Inpatient Patient remains hospitalized for ongoing post hip care, delirium Dispo: The patient is from: Home              Anticipated d/c is to: SNF  Vs HH pending ptot and his clinical improvement.Daughter unable to provide 24/7 assistance at home-but hiring private nurse- [employer]              Patient currently is not medically stable to d/c.   Difficult to place patient No   Unresulted Labs (From admission, onward)          Start     Ordered   09/10/20 0500  Creatinine, serum  (enoxaparin (LOVENOX)  CrCl >/= 30 mL/min  )  Weekly,   R     Comments: while on enoxaparin therapy.    09/03/20 1840   09/04/20 0735  Vitamin B1  Add-on,   AD        09/04/20 0735   09/04/20 5400  Basic metabolic panel  Daily,   R     Question:  Specimen collection method  Answer:  Lab=Lab collect   09/03/20 1406   09/04/20 0500  CBC  Daily,   R     Question:  Specimen collection method  Answer:  Lab=Lab collect   09/03/20 1406         Medications  reviewed:  Scheduled Meds: . allopurinol  300 mg Oral Daily  . aspirin  81 mg Oral QHS  . chlordiazePOXIDE  25 mg Oral TID  . docusate sodium  100 mg Oral BID  . donepezil  10 mg Oral QHS  . doxycycline  100 mg Oral Daily  . enoxaparin (LOVENOX) injection  40 mg Subcutaneous Q24H  . ezetimibe  10 mg Oral QHS  . feeding supplement  237 mL Oral BID BM  . folic acid  1 mg Oral Daily  . metoprolol succinate  25 mg Oral Daily  . mirtazapine  15 mg Oral QHS  . multivitamin with minerals  1 tablet Oral Daily  . senna-docusate  1 tablet Oral BID  . simvastatin  40 mg Oral q1800  . thiamine  100 mg Oral Daily   Or  . thiamine  100 mg Intravenous Daily   Continuous Infusions: . methocarbamol (ROBAXIN) IV      Consultants:see note  Procedures:see note  Antimicrobials: Anti-infectives (From admission, onward)   Start     Dose/Rate Route Frequency Ordered Stop   09/03/20 2200  ceFAZolin (ANCEF) IVPB 2g/100 mL premix        2 g 200 mL/hr over 30 Minutes Intravenous Every 6 hours 09/03/20 1840 09/04/20 1955   09/03/20 1130  ceFAZolin (ANCEF) IVPB 2g/100 mL premix        2 g 200 mL/hr over 30 Minutes Intravenous On call to O.R. 09/03/20 1040 09/03/20 1629   09/02/20 1745  doxycycline (VIBRA-TABS) tablet 100 mg       Note to Pharmacy: For rosacea/continuous     100 mg Oral Daily 09/02/20 1654       Culture/Microbiology    Component Value Date/Time   SDES FLUID SYNOVIAL KNEE LEFT 09/16/2008 1258   SPECREQUEST NONE 09/16/2008 1258   CULT NO GROWTH 3 DAYS 09/16/2008 1258   REPTSTATUS 09/20/2008 FINAL 09/16/2008 1258    Other culture-see note  Objective: Vitals: Today's Vitals   09/04/20 1339 09/04/20 2054 09/04/20 2116 09/05/20 0453  BP: 127/85 114/68  130/74  Pulse: 77 79  73  Resp: 16 18  16   Temp: 98 F (36.7 C)   98 F (36.7 C)  TempSrc: Oral   Oral  SpO2: 98% 98%  99%  Weight:      Height:      PainSc:   0-No pain     Intake/Output Summary (Last 24 hours) at  09/05/2020 1256 Last data filed at 09/05/2020 0458 Gross per 24 hour  Intake 360 ml  Output 1550 ml  Net -1190 ml   Filed Weights   09/01/20 2308 09/02/20 0134  Weight: 79.4 kg 79.4 kg   Weight change:   Intake/Output from previous day: 03/18 0701 - 03/19 0700 In: 480 [P.O.:480] Out: 1550 [Urine:1550] Intake/Output this  shift: No intake/output data recorded. Filed Weights   09/01/20 2308 09/02/20 0134  Weight: 79.4 kg 79.4 kg    Examination: General exam: AA oriented to self, daughter-reports he came to the hospital. HEENT:Oral mucosa moist, Ear/Nose WNL grossly, dentition normal. Respiratory system: bilaterally diminishedd,no wheezing or crackles,no use of accessory muscle Cardiovascular system: S1 & S2 +, No JVD,. Gastrointestinal system: Abdomen soft, NT,ND, BS+ Nervous System:Alert, awake, moving extremities and grossly nonfocal Extremities: Right lateral thigh w/ Aquacel dressing present x3 no edema, distal peripheral pulses palpable.  Skin: No rashes,no icterus. MSK: Normal muscle bulk,tone, power. Data Reviewed: I have personally reviewed following labs and imaging studies CBC: Recent Labs  Lab 09/02/20 0018 09/02/20 1714 09/03/20 2043 09/04/20 0522 09/05/20 0538  WBC 9.0 8.0 10.3 11.5* 9.7  NEUTROABS 6.6  --   --   --   --   HGB 13.5 14.4 14.3 12.9* 13.0  HCT 40.1 42.8 43.0 38.5* 39.3  MCV 96.9 99.1 100.0 98.0 99.0  PLT 131* 139* 121* 127* 696*   Basic Metabolic Panel: Recent Labs  Lab 09/02/20 0018 09/02/20 1714 09/03/20 2043 09/04/20 0522 09/05/20 0538  NA 136 137  --  136 139  K 4.1 3.9  --  4.0 3.6  CL 103 104  --  103 103  CO2 26 24  --  24 28  GLUCOSE 102* 97  --  135* 129*  BUN 22 15  --  17 22  CREATININE 0.90 0.81 0.80 0.79 0.85  CALCIUM 9.1 9.2  --  9.0 9.3  MG  --  2.0  --   --   --   PHOS  --  3.0  --   --   --    GFR: Estimated Creatinine Clearance: 79.9 mL/min (by C-G formula based on SCr of 0.85 mg/dL). Liver Function  Tests: Recent Labs  Lab 09/02/20 1714  AST 28  ALT 20  ALKPHOS 64  BILITOT 1.3*  PROT 7.1  ALBUMIN 4.1   No results for input(s): LIPASE, AMYLASE in the last 168 hours. Recent Labs  Lab 09/04/20 0819  AMMONIA <9*   Coagulation Profile: Recent Labs  Lab 09/02/20 0018  INR 1.1   Cardiac Enzymes: No results for input(s): CKTOTAL, CKMB, CKMBINDEX, TROPONINI in the last 168 hours. BNP (last 3 results) No results for input(s): PROBNP in the last 8760 hours. HbA1C: No results for input(s): HGBA1C in the last 72 hours. CBG: No results for input(s): GLUCAP in the last 168 hours. Lipid Profile: No results for input(s): CHOL, HDL, LDLCALC, TRIG, CHOLHDL, LDLDIRECT in the last 72 hours. Thyroid Function Tests: Recent Labs    09/04/20 0522  TSH 0.402   Anemia Panel: Recent Labs    09/04/20 0522  VITAMINB12 324   Sepsis Labs: No results for input(s): PROCALCITON, LATICACIDVEN in the last 168 hours.  Recent Results (from the past 240 hour(s))  Resp Panel by RT-PCR (Flu A&B, Covid) Nasopharyngeal Swab     Status: None   Collection Time: 09/02/20  1:26 AM   Specimen: Nasopharyngeal Swab; Nasopharyngeal(NP) swabs in vial transport medium  Result Value Ref Range Status   SARS Coronavirus 2 by RT PCR NEGATIVE NEGATIVE Final   Influenza A by PCR NEGATIVE NEGATIVE Final   Influenza B by PCR NEGATIVE NEGATIVE Final  MRSA PCR Screening     Status: None   Collection Time: 09/03/20 11:06 AM   Specimen: Nasal Mucosa; Nasopharyngeal  Result Value Ref Range Status   MRSA by PCR  NEGATIVE NEGATIVE Final    Comment:        The GeneXpert MRSA Assay (FDA approved for NASAL specimens only), is one component of a comprehensive MRSA colonization surveillance program. It is not intended to diagnose MRSA infection nor to guide or monitor treatment for MRSA infections. Performed at [facility], [place] [address] [place], [place] [address]      Radiology  Studies: Pelvis Portable  Result Date: 09/03/2020 CLINICAL DATA:  Status post right IM nail EXAM: PORTABLE PELVIS 1-2 VIEWS COMPARISON:  09/03/2020, 09/02/2020 FINDINGS: Interval intramedullary rod and distal screw fixation of the right femur for nondisplaced intertrochanteric fracture. Gas in the soft tissues consistent with recent surgery. Intact hardware and normal alignment. Vascular calcifications. IMPRESSION: Interval surgical fixation of nondisplaced right intertrochanteric fracture. Electronically Signed   By: Donavan Foil M.D.   On: 09/03/2020 18:28   DG C-Arm 1-60 Min-No Report  Result Date: 09/03/2020 Fluoroscopy was utilized by the requesting physician.  No radiographic interpretation.   DG HIP OPERATIVE UNILAT WITH PELVIS RIGHT  Result Date: 09/03/2020 CLINICAL DATA:  Right hip fracture. EXAM: OPERATIVE right HIP (WITH PELVIS IF PERFORMED) 4 VIEWS TECHNIQUE: Fluoroscopic spot image(s) were submitted for interpretation post-operatively. Radiation exposure index: 3.6602 mGy. COMPARISON:  September 01, 2020. FINDINGS: Four intraoperative fluoroscopic images demonstrate surgical internal fixation of right proximal femoral fracture. Good alignment of fracture components is noted. IMPRESSION: Fluoroscopic guidance provided during right hip surgery. Electronically Signed   By: Marijo Conception M.D.   On: 09/03/2020 17:44     LOS: 2 days   Antonieta Pert, MD [facility]  09/05/2020, 12:56 PM

## 2020-09-05 NOTE — Progress Notes (Signed)
Physical Therapy Treatment Patient Details Name: Jesus Jordan MRN: [id number] DOB: 16-May-1946 Today's Date: 09/05/2020    History of Present Illness Jesus Jordan is a 75 y.o. male with medical history significant of EtOH abuse, HTN, GERD, COPD, MI 2010 with current c/o hip pain after fall while playing tennis. CY and MRI identified intertrochanteric femur fracture, s/p R IM nail 09/03/20.    PT Comments    Per chair and friend in room pt was quite combative earlier, now lethargic d/t meds, arouses to voice and participates with RLE exercise however is resistant to movement LLE.  Updated d/c recommendations, per friend pt dtr feels pt will need SNF. Will continue PT POC.   Follow Up Recommendations  SNF     Equipment Recommendations  None recommended by PT    Recommendations for Other Services       Precautions / Restrictions Precautions Precautions: Fall Restrictions Weight Bearing Restrictions: No    Mobility  Bed Mobility               General bed mobility comments: deferred d/t lethargy    Transfers                    Ambulation/Gait                 Stairs             Wheelchair Mobility    Modified Rankin (Stroke Patients Only)       Balance                                            Cognition Arousal/Alertness: Lethargic;Suspect due to medications Behavior During Therapy: Flat affect Overall Cognitive Status: History of cognitive impairments - at baseline                                        Exercises General Exercises - Lower Extremity Ankle Circles/Pumps: AAROM;PROM;Both;10 reps;Limitations Ankle Circles/Pumps Limitations: lethargic Heel Slides: AAROM;Both;10 reps;Limitations Heel Slides Limitations: resistant to imposed movement on L Hip ABduction/ADduction: AAROM;Both;10 reps;Limitations Hip Abduction/Adduction Limitations: resistant to imposed movement on L (tol well on surgical  RLE-?)    General Comments        Pertinent Vitals/Pain Pain Assessment: Faces Faces Pain Scale: Hurts even more Pain Location: bil LE with imposed movement Pain Descriptors / Indicators: Grimacing;Discomfort Pain Intervention(s): Limited activity within patient's tolerance;Monitored during session;Repositioned    Home Living                      Prior Function            PT Goals (current goals can now be found in the care plan section) Acute Rehab PT Goals Patient Stated Goal: "play tennis" PT Goal Formulation: With patient Time For Goal Achievement: 09/18/20 Potential to Achieve Goals: Good Progress towards PT goals: Progressing toward goals    Frequency    Min 3X/week      PT Plan Discharge plan needs to be updated    Co-evaluation              AM-PAC PT "6 Clicks" Mobility   Outcome Measure  Help needed turning from your back to your side while in a flat bed without using bedrails?: A  Lot Help needed moving from lying on your back to sitting on the side of a flat bed without using bedrails?: A Lot Help needed moving to and from a bed to a chair (including a wheelchair)?: Total Help needed standing up from a chair using your arms (e.g., wheelchair or bedside chair)?: Total Help needed to walk in hospital room?: Total Help needed climbing 3-5 steps with a railing? : Total 6 Click Score: 8    End of Session   Activity Tolerance: Patient limited by lethargy Patient left: in bed;with call bell/phone within reach;with bed alarm set;with family/visitor present   PT Visit Diagnosis: Unsteadiness on feet (R26.81);Other abnormalities of gait and mobility (R26.89);Pain Pain - Right/Left: Right Pain - part of body: Hip     Time: 6295-2841 PT Time Calculation (min) (ACUTE ONLY): 17 min  Charges:  $Therapeutic Exercise: 23-37 mins                     Baxter Flattery, PT  Acute Rehab Dept ([place]) [phone] Pager  [phone]  09/05/2020    [facility] 09/05/2020, 2:00 PM

## 2020-09-06 ENCOUNTER — Encounter (HOSPITAL_COMMUNITY): Payer: Self-pay | Admitting: Internal Medicine

## 2020-09-06 DIAGNOSIS — I10 Essential (primary) hypertension: Secondary | ICD-10-CM

## 2020-09-06 DIAGNOSIS — F0280 Dementia in other diseases classified elsewhere without behavioral disturbance: Secondary | ICD-10-CM | POA: Diagnosis not present

## 2020-09-06 DIAGNOSIS — F1010 Alcohol abuse, uncomplicated: Secondary | ICD-10-CM | POA: Diagnosis not present

## 2020-09-06 DIAGNOSIS — S72001A Fracture of unspecified part of neck of right femur, initial encounter for closed fracture: Secondary | ICD-10-CM | POA: Diagnosis not present

## 2020-09-06 LAB — CBC
HCT: 36.1 % — ABNORMAL LOW (ref 39.0–52.0)
Hemoglobin: 12 g/dL — ABNORMAL LOW (ref 13.0–17.0)
MCH: 33.1 pg (ref 26.0–34.0)
MCHC: 33.2 g/dL (ref 30.0–36.0)
MCV: 99.7 fL (ref 80.0–100.0)
Platelets: 141 10*3/uL — ABNORMAL LOW (ref 150–400)
RBC: 3.62 MIL/uL — ABNORMAL LOW (ref 4.22–5.81)
RDW: 14.1 % (ref 11.5–15.5)
WBC: 8.6 10*3/uL (ref 4.0–10.5)
nRBC: 0 % (ref 0.0–0.2)

## 2020-09-06 LAB — BASIC METABOLIC PANEL
Anion gap: 11 (ref 5–15)
BUN: 29 mg/dL — ABNORMAL HIGH (ref 8–23)
CO2: 23 mmol/L (ref 22–32)
Calcium: 9 mg/dL (ref 8.9–10.3)
Chloride: 104 mmol/L (ref 98–111)
Creatinine, Ser: 0.79 mg/dL (ref 0.61–1.24)
GFR, Estimated: >60 mL/min (ref >60)
Glucose, Bld: 101 mg/dL — ABNORMAL HIGH (ref 70–99)
Potassium: 3.9 mmol/L (ref 3.5–5.1)
Sodium: 138 mmol/L (ref 135–145)

## 2020-09-06 NOTE — Progress Notes (Signed)
PROGRESS NOTE    Jesus Jordan  HKV:[id number] DOB: 11/12/1945 DOA: 09/01/2020 PCP: Eulas Post, MD   Chief Complaint  Patient presents with  . Fall    R. Leg Pain   Brief Narrative: 75 year old male with history of hypertension GERD hyperlipidemia, dementia history of alcohol abuse, very functional at baseline with short-term memory issues, brought to the ED after a fall while he was playing tennis 3/15. In the ED imaging concerning for hip fracture, Ortho was consulted and MRI was ordered-which confirmed fracture, underwent right intertrochanteric IM fixation 3/17. Pt received ativan w/ ciwa and was deeply sedated- ativan stopped. 3/18 seen by psych- librium recommended    Subjective: Patient seen and examined at bedside.  Daughter present at bedside.  Of note, patient reports to be feeling better this morning, slept well overnight.  Noted to be more alert, awake, oriented this morning.  Denies any new complaints.   Assessment & Plan: Nondisplaced intertrochanteric fracture right femur 2/2 mechanical fall s/p right IM fixation on 3/17 Continue DVT prophylaxis with Lovenox, continue pain management Defer pain management rx and DVT prophylaxis rx for outpatient to orthopedics team PT/OT, vitamin D level is stable.  Acute metabolic encephalopathy/delirium multifactorial suspect alcohol withdrawal given his significant alcohol use history and 2/2 his Dementia, post hip fracture surgery/anesthesia/ hospitalization Improving TSH B12 normal, B1 pending, no evidence of UTI in the urine Baseline with short-term memory issues and at times hallucination Patient was deeply sedated with Ativan on admission Continue Librium, plan to taper, added low-dose Ativan for agitation Daughter confirmed he drinks 6 packs of beer daily Appreciate psych team evaluation and follow-up Continue Remeron, Seroquel was added as needed Continue multivitamin folate thiamine.    CAD remote history of a  stent/Hyperlipidemia Continue his aspirin, beta-blocker, Zetia and statin.  Hypertension BP stable continue metoprolol  GERD Continue PPI        Patient's Body mass index is 25.12 kg/m.  DVT prophylaxis: enoxaparin (LOVENOX) injection 40 mg Start: 09/04/20 0800 SCDs Start: 09/03/20 1841. Code Status:   Code Status: Partial Code DNI, okay for CPR. Family Communication: plan of care discussed with patient's daughter at the bedside on 09/06/20   Status is: Inpatient Patient remains hospitalized for ongoing post hip care Dispo: The patient is from: Home              Anticipated d/c is to: SNF  Vs HH pending ptot and his clinical improvement. Daughter unable to provide 24/7 assistance at home-but hiring private nurse              Patient currently is not medically stable to d/c.   Difficult to place patient No    Medications reviewed:  Scheduled Meds: . allopurinol  300 mg Oral Daily  . aspirin  81 mg Oral QHS  . chlordiazePOXIDE  25 mg Oral TID  . docusate sodium  100 mg Oral BID  . donepezil  10 mg Oral QHS  . doxycycline  100 mg Oral Daily  . enoxaparin (LOVENOX) injection  40 mg Subcutaneous Q24H  . ezetimibe  10 mg Oral QHS  . feeding supplement  237 mL Oral BID BM  . folic acid  1 mg Oral Daily  . metoprolol succinate  25 mg Oral Daily  . mirtazapine  15 mg Oral QHS  . multivitamin with minerals  1 tablet Oral Daily  . senna-docusate  1 tablet Oral BID  . simvastatin  40 mg Oral q1800  . thiamine  100  mg Oral Daily   Or  . thiamine  100 mg Intravenous Daily   Continuous Infusions: . methocarbamol (ROBAXIN) IV      Consultants:see note  Procedures:see note  Antimicrobials: Anti-infectives (From admission, onward)   Start     Dose/Rate Route Frequency Ordered Stop   09/03/20 2200  ceFAZolin (ANCEF) IVPB 2g/100 mL premix        2 g 200 mL/hr over 30 Minutes Intravenous Every 6 hours 09/03/20 1840 09/04/20 1955   09/03/20 1130  ceFAZolin (ANCEF) IVPB  2g/100 mL premix        2 g 200 mL/hr over 30 Minutes Intravenous On call to O.R. 09/03/20 1040 09/03/20 1629   09/02/20 1745  doxycycline (VIBRA-TABS) tablet 100 mg       Note to Pharmacy: For rosacea/continuous     100 mg Oral Daily 09/02/20 1654       Culture/Microbiology    Component Value Date/Time   SDES FLUID SYNOVIAL KNEE LEFT 09/16/2008 1258   SPECREQUEST NONE 09/16/2008 1258   CULT NO GROWTH 3 DAYS 09/16/2008 1258   REPTSTATUS 09/20/2008 FINAL 09/16/2008 1258    Other culture-see note  Objective: Vitals: Today's Vitals   09/06/20 0551 09/06/20 0552 09/06/20 1258 09/06/20 1335  BP: 96/64 90/61 93/60  (!) 141/87  Pulse: 67 67 72 (!) 55  Resp: 16  16   Temp: 98.1 F (36.7 C)  98.2 F (36.8 C) 97.9 F (36.6 C)  TempSrc:   Oral Oral  SpO2: 97% 96% 98% 98%  Weight:      Height:      PainSc:        Intake/Output Summary (Last 24 hours) at 09/06/2020 1608 Last data filed at 09/06/2020 1400 Gross per 24 hour  Intake 120 ml  Output 1100 ml  Net -980 ml   Filed Weights   09/01/20 2308 09/02/20 0134  Weight: 79.4 kg 79.4 kg   Weight change:   Intake/Output from previous day: 03/19 0701 - 03/20 0700 In: 120 [P.O.:120] Out: 650 [Urine:650] Intake/Output this shift: Total I/O In: -  Out: 450 [Urine:450] Filed Weights   09/01/20 2308 09/02/20 0134  Weight: 79.4 kg 79.4 kg    Examination:  General: NAD, awake, alert, oriented  Cardiovascular: S1, S2 present  Respiratory: CTAB  Abdomen: Soft, nontender, nondistended, bowel sounds present  Musculoskeletal: No bilateral pedal edema noted, right thigh with dressing present, C/D/  Skin: Normal  Psychiatry: Normal mood   Data Reviewed: I have personally reviewed following labs and imaging studies CBC: Recent Labs  Lab 09/02/20 0018 09/02/20 1714 09/03/20 2043 09/04/20 0522 09/05/20 0538 09/06/20 0526  WBC 9.0 8.0 10.3 11.5* 9.7 8.6  NEUTROABS 6.6  --   --   --   --   --   HGB 13.5 14.4 14.3  12.9* 13.0 12.0*  HCT 40.1 42.8 43.0 38.5* 39.3 36.1*  MCV 96.9 99.1 100.0 98.0 99.0 99.7  PLT 131* 139* 121* 127* 130* 179*   Basic Metabolic Panel: Recent Labs  Lab 09/02/20 0018 09/02/20 1714 09/03/20 2043 09/04/20 0522 09/05/20 0538 09/06/20 0526  NA 136 137  --  136 139 138  K 4.1 3.9  --  4.0 3.6 3.9  CL 103 104  --  103 103 104  CO2 26 24  --  24 28 23   GLUCOSE 102* 97  --  135* 129* 101*  BUN 22 15  --  17 22 29*  CREATININE 0.90 0.81 0.80 0.79 0.85 0.79  CALCIUM  9.1 9.2  --  9.0 9.3 9.0  MG  --  2.0  --   --   --   --   PHOS  --  3.0  --   --   --   --    GFR: Estimated Creatinine Clearance: 84.9 mL/min (by C-G formula based on SCr of 0.79 mg/dL). Liver Function Tests: Recent Labs  Lab 09/02/20 1714  AST 28  ALT 20  ALKPHOS 64  BILITOT 1.3*  PROT 7.1  ALBUMIN 4.1   No results for input(s): LIPASE, AMYLASE in the last 168 hours. Recent Labs  Lab 09/04/20 0819  AMMONIA <9*   Coagulation Profile: Recent Labs  Lab 09/02/20 0018  INR 1.1   Cardiac Enzymes: No results for input(s): CKTOTAL, CKMB, CKMBINDEX, TROPONINI in the last 168 hours. BNP (last 3 results) No results for input(s): PROBNP in the last 8760 hours. HbA1C: No results for input(s): HGBA1C in the last 72 hours. CBG: No results for input(s): GLUCAP in the last 168 hours. Lipid Profile: No results for input(s): CHOL, HDL, LDLCALC, TRIG, CHOLHDL, LDLDIRECT in the last 72 hours. Thyroid Function Tests: Recent Labs    09/04/20 0522  TSH 0.402   Anemia Panel: Recent Labs    09/04/20 0522  VITAMINB12 324   Sepsis Labs: No results for input(s): PROCALCITON, LATICACIDVEN in the last 168 hours.  Recent Results (from the past 240 hour(s))  Resp Panel by RT-PCR (Flu A&B, Covid) Nasopharyngeal Swab     Status: None   Collection Time: 09/02/20  1:26 AM   Specimen: Nasopharyngeal Swab; Nasopharyngeal(NP) swabs in vial transport medium  Result Value Ref Range Status   SARS Coronavirus 2  by RT PCR NEGATIVE NEGATIVE Final   Influenza A by PCR NEGATIVE NEGATIVE Final   Influenza B by PCR NEGATIVE NEGATIVE Final  MRSA PCR Screening     Status: None   Collection Time: 09/03/20 11:06 AM   Specimen: Nasal Mucosa; Nasopharyngeal  Result Value Ref Range Status   MRSA by PCR NEGATIVE NEGATIVE Final    Comment:        The GeneXpert MRSA Assay (FDA approved for NASAL specimens only), is one component of a comprehensive MRSA colonization surveillance program. It is not intended to diagnose MRSA infection nor to guide or monitor treatment for MRSA infections. Performed at [facility], [place] [address], [place], [place] [address]      Radiology Studies: No results found.   LOS: 3 days   Alma Friendly, MD [facility]  09/06/2020, 4:08 PM

## 2020-09-06 NOTE — Progress Notes (Signed)
Physical Therapy Treatment Patient Details Name: Jesus Jordan MRN: [id number] DOB: 17-Dec-1945 Today's Date: 09/06/2020    History of Present Illness Jesus Jordan is a 75 y.o. male with medical history significant of EtOH abuse, HTN, GERD, COPD, MI 2010 with current c/o hip pain after fall while playing tennis. CY and MRI identified intertrochanteric femur fracture, s/p R IM nail 09/03/20.    PT Comments    Pt progressing very well today. dtr present for PT session. Continue to recommend SNF post acute and will continue PT in acute setting  Follow Up Recommendations  SNF     Equipment Recommendations  None recommended by PT    Recommendations for Other Services       Precautions / Restrictions Precautions Precautions: Fall Restrictions Weight Bearing Restrictions: No    Mobility  Bed Mobility Overal bed mobility: Needs Assistance Bed Mobility: Supine to Sit;Sit to Supine     Supine to sit: Min assist Sit to supine: Min assist   General bed mobility comments: assist with RLE on and off bed, incr time needed    Transfers Overall transfer level: Needs assistance Equipment used: Rolling walker (2 wheeled) Transfers: Sit to/from Stand Sit to Stand: Min assist         General transfer comment: min assist to steady on rising and transition to RW, initial posterior lean, pt able to correct with time and verbal cues  Ambulation/Gait Ambulation/Gait assistance: Min assist Gait Distance (Feet): 40 Feet Assistive device: Rolling walker (2 wheeled) Gait Pattern/deviations: Step-to pattern;Decreased weight shift to right Gait velocity: decreased   General Gait Details: cues for sequence, RW position and trunk extension   Stairs             Wheelchair Mobility    Modified Rankin (Stroke Patients Only)       Balance   Sitting-balance support: Feet supported;No upper extremity supported Sitting balance-Leahy Scale: Good     Standing balance support: During  functional activity Standing balance-Leahy Scale: Poor Standing balance comment: reliant on RW, initial external assist to maintain static stand                            Cognition Arousal/Alertness: Awake/alert Behavior During Therapy: WFL for tasks assessed/performed Overall Cognitive Status: History of cognitive impairments - at baseline                                 General Comments: pt cooperative, follows one step commands consistently      Exercises      General Comments        Pertinent Vitals/Pain Pain Assessment: Faces Faces Pain Scale: Hurts a little bit Pain Location: RLE Pain Descriptors / Indicators: Discomfort;Sore Pain Intervention(s): Limited activity within patient's tolerance;Monitored during session;Repositioned    Home Living                      Prior Function            PT Goals (current goals can now be found in the care plan section) Acute Rehab PT Goals Patient Stated Goal: "play tennis" PT Goal Formulation: With patient Time For Goal Achievement: 09/18/20 Potential to Achieve Goals: Good Progress towards PT goals: Progressing toward goals    Frequency    Min 3X/week      PT Plan Discharge plan needs to be updated  Co-evaluation              AM-PAC PT "6 Clicks" Mobility   Outcome Measure  Help needed turning from your back to your side while in a flat bed without using bedrails?: A Little Help needed moving from lying on your back to sitting on the side of a flat bed without using bedrails?: A Little Help needed moving to and from a bed to a chair (including a wheelchair)?: A Little Help needed standing up from a chair using your arms (e.g., wheelchair or bedside chair)?: A Little Help needed to walk in hospital room?: A Little Help needed climbing 3-5 steps with a railing? : A Lot 6 Click Score: 17    End of Session Equipment Utilized During Treatment: Gait belt Activity  Tolerance: Patient tolerated treatment well Patient left: with call bell/phone within reach;in bed;with bed alarm set;with family/visitor present Nurse Communication: Mobility status PT Visit Diagnosis: Unsteadiness on feet (R26.81);Other abnormalities of gait and mobility (R26.89);Pain Pain - Right/Left: Right Pain - part of body: Hip     Time: 3790-2409 PT Time Calculation (min) (ACUTE ONLY): 23 min  Charges:  $Gait Training: 23-37 mins                     Baxter Flattery, PT  Acute Rehab Dept ([place]) [phone] Pager [phone]  09/06/2020    [facility] 09/06/2020, 11:35 AM

## 2020-09-07 DIAGNOSIS — S72001A Fracture of unspecified part of neck of right femur, initial encounter for closed fracture: Secondary | ICD-10-CM | POA: Diagnosis not present

## 2020-09-07 DIAGNOSIS — I10 Essential (primary) hypertension: Secondary | ICD-10-CM | POA: Diagnosis not present

## 2020-09-07 DIAGNOSIS — F1010 Alcohol abuse, uncomplicated: Secondary | ICD-10-CM | POA: Diagnosis not present

## 2020-09-07 DIAGNOSIS — F0280 Dementia in other diseases classified elsewhere without behavioral disturbance: Secondary | ICD-10-CM | POA: Diagnosis not present

## 2020-09-07 LAB — BASIC METABOLIC PANEL
Anion gap: 8 (ref 5–15)
BUN: 24 mg/dL — ABNORMAL HIGH (ref 8–23)
CO2: 26 mmol/L (ref 22–32)
Calcium: 9.1 mg/dL (ref 8.9–10.3)
Chloride: 102 mmol/L (ref 98–111)
Creatinine, Ser: 0.85 mg/dL (ref 0.61–1.24)
GFR, Estimated: >60 mL/min (ref >60)
Glucose, Bld: 103 mg/dL — ABNORMAL HIGH (ref 70–99)
Potassium: 3.7 mmol/L (ref 3.5–5.1)
Sodium: 136 mmol/L (ref 135–145)

## 2020-09-07 LAB — CBC WITH DIFFERENTIAL/PLATELET
Abs Immature Granulocytes: 0.03 10*3/uL (ref 0.00–0.07)
Basophils Absolute: 0.1 10*3/uL (ref 0.0–0.1)
Basophils Relative: 1 %
Eosinophils Absolute: 0.3 10*3/uL (ref 0.0–0.5)
Eosinophils Relative: 5 %
HCT: 37.2 % — ABNORMAL LOW (ref 39.0–52.0)
Hemoglobin: 12.2 g/dL — ABNORMAL LOW (ref 13.0–17.0)
Immature Granulocytes: 0 %
Lymphocytes Relative: 27 %
Lymphs Abs: 1.9 10*3/uL (ref 0.7–4.0)
MCH: 32.6 pg (ref 26.0–34.0)
MCHC: 32.8 g/dL (ref 30.0–36.0)
MCV: 99.5 fL (ref 80.0–100.0)
Monocytes Absolute: 1.1 10*3/uL — ABNORMAL HIGH (ref 0.1–1.0)
Monocytes Relative: 16 %
Neutro Abs: 3.6 10*3/uL (ref 1.7–7.7)
Neutrophils Relative %: 51 %
Platelets: 144 10*3/uL — ABNORMAL LOW (ref 150–400)
RBC: 3.74 MIL/uL — ABNORMAL LOW (ref 4.22–5.81)
RDW: 13.8 % (ref 11.5–15.5)
WBC: 6.9 10*3/uL (ref 4.0–10.5)
nRBC: 0 % (ref 0.0–0.2)

## 2020-09-07 MED ORDER — CHLORDIAZEPOXIDE HCL 5 MG PO CAPS
25.0000 mg | ORAL_CAPSULE | Freq: Two times a day (BID) | ORAL | Status: DC
  Administered 2020-09-07 – 2020-09-08 (×2): 25 mg via ORAL
  Filled 2020-09-07 (×2): qty 5

## 2020-09-07 NOTE — NC FL2 (Signed)
[place] LEVEL OF CARE SCREENING TOOL     IDENTIFICATION  Patient Name: Jesus Jordan Birthdate: May 15, 1946 Sex: male Admission Date (Current Location): 09/01/2020  [facility] and [place] Number:  Herbalist and Address:  [facility],  [address], [place]      Provider Number: [id number]  Attending Physician Name and Address:  Alma Friendly, MD  Relative Name and Phone Number:       Current Level of Care: Hospital Recommended Level of Care: [place] Prior Approval Number:    Date Approved/Denied:   PASRR Number: [id number]  Discharge Plan: SNF    Current Diagnoses: Patient Active Problem List   Diagnosis Date Noted  . Hip fracture ([place]) 09/03/2020  . Closed right hip fracture ([place]) 09/02/2020  . Major neurocognitive disorder due to multiple etiologies without behavioral disturbance 05/29/2020  . Alcohol abuse, daily use   . Hearing loss   . COPD (chronic obstructive pulmonary disease) 12/10/2018  . Essential hypertension 12/10/2018  . Chest pain 12/09/2018  . GERD (gastroesophageal reflux disease) 04/18/2018  . CAD in native artery 05/03/2016  . Hyperlipidemia with target LDL less than 70 03/06/2013  . Inguinal hernia 05/25/2009  . Gout 01/29/2009  . Coronary atherosclerosis 01/29/2009  . History of MI (myocardial infarction) 09/12/2008    Orientation RESPIRATION BLADDER Height & Weight     Self,Time,Situation,Place (Forgetful at times)  Normal Incontinent Weight: 79.4 kg Height:  5\' 10"  (177.8 cm)  BEHAVIORAL SYMPTOMS/MOOD NEUROLOGICAL BOWEL NUTRITION STATUS      Continent Diet (Regular)  AMBULATORY STATUS COMMUNICATION OF NEEDS Skin   Limited Assist Verbally Surgical wounds (R hip fracture)                       Personal Care Assistance Level of Assistance  Bathing,Dressing,Feeding Bathing Assistance: Limited assistance Feeding assistance: Limited assistance Dressing  Assistance: Limited assistance     Functional Limitations Info  Sight,Hearing Sight Info: Impaired Hearing Info: Impaired      SPECIAL CARE FACTORS FREQUENCY  PT (By licensed PT),OT (By licensed OT)     PT Frequency: 5 x weekly OT Frequency: 5 x weekly            Contractures Contractures Info: Not present    Additional Factors Info  Code Status,Allergies Code Status Info: Partial Allergies Info: Imdur, Lisinopril           Current Medications (09/07/2020):  This is the current hospital active medication list Current Facility-Administered Medications  Medication Dose Route Frequency Provider Last Rate Last Admin  . acetaminophen (TYLENOL) tablet 325-650 mg  325-650 mg Oral Q6H PRN Swinteck, Aaron Edelman, MD      . allopurinol (ZYLOPRIM) tablet 300 mg  300 mg Oral Daily Rod Can, MD   300 mg at 09/07/20 1004  . aspirin chewable tablet 81 mg  81 mg Oral QHS Rod Can, MD   81 mg at 09/06/20 2153  . chlordiazePOXIDE (LIBRIUM) capsule 10 mg  10 mg Oral TID PRN Suella Broad, FNP   10 mg at 09/05/20 0015  . chlordiazePOXIDE (LIBRIUM) capsule 25 mg  25 mg Oral TID Antonieta Pert, MD   25 mg at 09/07/20 1003  . docusate sodium (COLACE) capsule 100 mg  100 mg Oral BID Rod Can, MD   100 mg at 09/07/20 1004  . donepezil (ARICEPT) tablet 10 mg  10 mg Oral QHS Rod Can, MD   10 mg at 09/06/20  2156  . doxycycline (VIBRA-TABS) tablet 100 mg  100 mg Oral Daily Rod Can, MD   100 mg at 09/07/20 1004  . enoxaparin (LOVENOX) injection 40 mg  40 mg Subcutaneous Q24H Rod Can, MD   40 mg at 09/07/20 0800  . ezetimibe (ZETIA) tablet 10 mg  10 mg Oral QHS Rod Can, MD   10 mg at 09/06/20 2157  . feeding supplement (ENSURE SURGERY) liquid 237 mL  237 mL Oral BID BM Kc, Ramesh, MD   237 mL at 09/04/20 1335  . folic acid (FOLVITE) tablet 1 mg  1 mg Oral Daily Swinteck, Aaron Edelman, MD   1 mg at 09/07/20 1004  . HYDROcodone-acetaminophen (NORCO/VICODIN)  5-325 MG per tablet 1-2 tablet  1-2 tablet Oral Q6H PRN Rod Can, MD   2 tablet at 09/04/20 0403  . LORazepam (ATIVAN) injection 0.5 mg  0.5 mg Intravenous Q4H PRN Kc, Ramesh, MD      . menthol-cetylpyridinium (CEPACOL) lozenge 3 mg  1 lozenge Oral PRN Swinteck, Aaron Edelman, MD       Or  . phenol (CHLORASEPTIC) mouth spray 1 spray  1 spray Mouth/Throat PRN Swinteck, Aaron Edelman, MD      . methocarbamol (ROBAXIN) tablet 500 mg  500 mg Oral Q6H PRN Swinteck, Aaron Edelman, MD       Or  . methocarbamol (ROBAXIN) 500 mg in dextrose 5 % 50 mL IVPB  500 mg Intravenous Q6H PRN Swinteck, Aaron Edelman, MD      . metoCLOPramide (REGLAN) tablet 5-10 mg  5-10 mg Oral Q8H PRN Swinteck, Aaron Edelman, MD       Or  . metoCLOPramide (REGLAN) injection 5-10 mg  5-10 mg Intravenous Q8H PRN Swinteck, Aaron Edelman, MD      . metoprolol succinate (TOPROL-XL) 24 hr tablet 25 mg  25 mg Oral Daily Rod Can, MD   25 mg at 09/07/20 1004  . mirtazapine (REMERON) tablet 15 mg  15 mg Oral QHS Rod Can, MD   15 mg at 09/06/20 2156  . morphine 2 MG/ML injection 0.5-1 mg  0.5-1 mg Intravenous Q2H PRN Rod Can, MD   1 mg at 09/03/20 2351  . multivitamin with minerals tablet 1 tablet  1 tablet Oral Daily Rod Can, MD   1 tablet at 09/07/20 1004  . naphazoline-glycerin (CLEAR EYES REDNESS) ophth solution 1 drop  1 drop Both Eyes QID PRN Swinteck, Aaron Edelman, MD      . ondansetron (ZOFRAN) tablet 4 mg  4 mg Oral Q6H PRN Swinteck, Aaron Edelman, MD       Or  . ondansetron (ZOFRAN) injection 4 mg  4 mg Intravenous Q6H PRN Swinteck, Aaron Edelman, MD      . polyethylene glycol (MIRALAX / GLYCOLAX) packet 17 g  17 g Oral Daily PRN Swinteck, Aaron Edelman, MD      . QUEtiapine (SEROQUEL) tablet 25 mg  25 mg Oral QHS PRN Suella Broad, FNP   25 mg at 09/04/20 2301  . senna-docusate (Senokot-S) tablet 1 tablet  1 tablet Oral BID Antonieta Pert, MD   1 tablet at 09/07/20 1004  . simvastatin (ZOCOR) tablet 40 mg  40 mg Oral q1800 Rod Can, MD   40 mg at 09/06/20  1717  . thiamine tablet 100 mg  100 mg Oral Daily Swinteck, Aaron Edelman, MD   100 mg at 09/07/20 1004   Or  . thiamine (B-1) injection 100 mg  100 mg Intravenous Daily Rod Can, MD   100 mg at 09/03/20 1100  . traMADol (ULTRAM) tablet  50 mg  50 mg Oral Q6H PRN Rod Can, MD   50 mg at 09/03/20 2156     Discharge Medications: Please see discharge summary for a list of discharge medications.  Relevant Imaging Results:  Relevant Lab Results:   Additional Information ss# [id number]  Covid vaccinated  CLEMENTS, Marjie Skiff, RN

## 2020-09-07 NOTE — Care Management Important Message (Signed)
Important Message  Patient Details IM Letter given to the Patient. Name: Jesus Jordan MRN: [id number] Date of Birth: 1946/03/17   Medicare Important Message Given:  Yes     Kerin Salen 09/07/2020, 11:39 AM

## 2020-09-07 NOTE — Progress Notes (Signed)
PROGRESS NOTE    Jesus Jordan  KYH:[id number] DOB: 1945-07-27 DOA: 09/01/2020 PCP: Eulas Post, MD   Chief Complaint  Patient presents with  . Fall    R. Leg Pain   Brief Narrative: 75 year old male with history of hypertension GERD hyperlipidemia, dementia history of alcohol abuse, very functional at baseline with short-term memory issues, brought to the ED after a fall while he was playing tennis 3/15. In the ED imaging concerning for hip fracture, Ortho was consulted and MRI was ordered-which confirmed fracture, underwent right intertrochanteric IM fixation 3/17. Pt received ativan w/ ciwa and was deeply sedated- ativan stopped. 3/18 seen by psych- librium recommended    Subjective: Patient seen and examined at bedside, denies any new complaints.   Assessment & Plan: Nondisplaced intertrochanteric fracture right femur 2/2 mechanical fall s/p right IM fixation on 3/17 Continue DVT prophylaxis with Lovenox, continue pain management Defer pain management rx and DVT prophylaxis rx for outpatient to orthopedics team PT/OT, vitamin D level is stable.  Acute metabolic encephalopathy/delirium multifactorial suspect alcohol withdrawal given his significant alcohol use history and 2/2 his Dementia, post hip fracture surgery/anesthesia/ hospitalization Improving TSH B12 normal, B1 pending, no evidence of UTI in the urine Baseline with short-term memory issues and at times hallucination Patient was deeply sedated with Ativan on admission Continue Librium, plan to taper, added low-dose Ativan prn for agitation Daughter confirmed he drinks 6 packs of beer daily Appreciate psych team evaluation and follow-up Continue Remeron, Seroquel was added as needed Continue multivitamin folate thiamine.    CAD remote history of a stent/Hyperlipidemia Continue his aspirin, beta-blocker, Zetia and statin.  Hypertension BP stable continue metoprolol  GERD Continue PPI        Patient's  Body mass index is 25.12 kg/m.  DVT prophylaxis: enoxaparin (LOVENOX) injection 40 mg Start: 09/04/20 0800 SCDs Start: 09/03/20 1841. Code Status:   Code Status: Partial Code DNI, okay for CPR. Family Communication: plan of care discussed with patient's daughter at the bedside on 09/06/20   Status is: Inpatient Patient remains hospitalized for ongoing post hip care Dispo: The patient is from: Home              Anticipated d/c is to: SNF    Daughter unable to provide 24/7 assistance at home-but hiring private nurse              Patient currently is medically stable to d/c.   Difficult to place patient No    Medications reviewed:  Scheduled Meds: . allopurinol  300 mg Oral Daily  . aspirin  81 mg Oral QHS  . chlordiazePOXIDE  25 mg Oral BID  . docusate sodium  100 mg Oral BID  . donepezil  10 mg Oral QHS  . doxycycline  100 mg Oral Daily  . enoxaparin (LOVENOX) injection  40 mg Subcutaneous Q24H  . ezetimibe  10 mg Oral QHS  . feeding supplement  237 mL Oral BID BM  . folic acid  1 mg Oral Daily  . metoprolol succinate  25 mg Oral Daily  . mirtazapine  15 mg Oral QHS  . multivitamin with minerals  1 tablet Oral Daily  . senna-docusate  1 tablet Oral BID  . simvastatin  40 mg Oral q1800  . thiamine  100 mg Oral Daily   Or  . thiamine  100 mg Intravenous Daily   Continuous Infusions: . methocarbamol (ROBAXIN) IV      Consultants:see note  Procedures:see note  Antimicrobials: Anti-infectives (From  admission, onward)   Start     Dose/Rate Route Frequency Ordered Stop   09/03/20 2200  ceFAZolin (ANCEF) IVPB 2g/100 mL premix        2 g 200 mL/hr over 30 Minutes Intravenous Every 6 hours 09/03/20 1840 09/04/20 1955   09/03/20 1130  ceFAZolin (ANCEF) IVPB 2g/100 mL premix        2 g 200 mL/hr over 30 Minutes Intravenous On call to O.R. 09/03/20 1040 09/03/20 1629   09/02/20 1745  doxycycline (VIBRA-TABS) tablet 100 mg       Note to Pharmacy: For rosacea/continuous      100 mg Oral Daily 09/02/20 1654       Culture/Microbiology    Component Value Date/Time   SDES FLUID SYNOVIAL KNEE LEFT 09/16/2008 1258   SPECREQUEST NONE 09/16/2008 1258   CULT NO GROWTH 3 DAYS 09/16/2008 1258   REPTSTATUS 09/20/2008 FINAL 09/16/2008 1258    Other culture-see note  Objective: Vitals: Today's Vitals   09/06/20 2150 09/07/20 0601 09/07/20 0800 09/07/20 1430  BP:  119/72  100/63  Pulse:  63  64  Resp:  16  16  Temp:  97.6 F (36.4 C)  97.6 F (36.4 C)  TempSrc:  Oral  Oral  SpO2:  99%    Weight:      Height:      PainSc: 0-No pain  0-No pain     Intake/Output Summary (Last 24 hours) at 09/07/2020 1552 Last data filed at 09/07/2020 1245 Gross per 24 hour  Intake 480 ml  Output 900 ml  Net -420 ml   Filed Weights   09/01/20 2308 09/02/20 0134  Weight: 79.4 kg 79.4 kg   Weight change:   Intake/Output from previous day: 03/20 0701 - 03/21 0700 In: -  Out: 750 [Urine:750] Intake/Output this shift: Total I/O In: 480 [P.O.:480] Out: 600 [Urine:600] Filed Weights   09/01/20 2308 09/02/20 0134  Weight: 79.4 kg 79.4 kg    Examination:  General: NAD, awake, alert, oriented  Cardiovascular: S1, S2 present  Respiratory: CTAB  Abdomen: Soft, nontender, nondistended, bowel sounds present  Musculoskeletal: No bilateral pedal edema noted, right thigh with dressing present, C/D/  Skin: Normal  Psychiatry: Normal mood   Data Reviewed: I have personally reviewed following labs and imaging studies CBC: Recent Labs  Lab 09/02/20 0018 09/02/20 1714 09/03/20 2043 09/04/20 0522 09/05/20 0538 09/06/20 0526 09/07/20 0722  WBC 9.0   < > 10.3 11.5* 9.7 8.6 6.9  NEUTROABS 6.6  --   --   --   --   --  3.6  HGB 13.5   < > 14.3 12.9* 13.0 12.0* 12.2*  HCT 40.1   < > 43.0 38.5* 39.3 36.1* 37.2*  MCV 96.9   < > 100.0 98.0 99.0 99.7 99.5  PLT 131*   < > 121* 127* 130* 141* 144*   < > = values in this interval not displayed.   Basic Metabolic  Panel: Recent Labs  Lab 09/02/20 1714 09/03/20 2043 09/04/20 0522 09/05/20 0538 09/06/20 0526 09/07/20 0722  NA 137  --  136 139 138 136  K 3.9  --  4.0 3.6 3.9 3.7  CL 104  --  103 103 104 102  CO2 24  --  24 28 23 26   GLUCOSE 97  --  135* 129* 101* 103*  BUN 15  --  17 22 29* 24*  CREATININE 0.81 0.80 0.79 0.85 0.79 0.85  CALCIUM 9.2  --  9.0  9.3 9.0 9.1  MG 2.0  --   --   --   --   --   PHOS 3.0  --   --   --   --   --    GFR: Estimated Creatinine Clearance: 79.9 mL/min (by C-G formula based on SCr of 0.85 mg/dL). Liver Function Tests: Recent Labs  Lab 09/02/20 1714  AST 28  ALT 20  ALKPHOS 64  BILITOT 1.3*  PROT 7.1  ALBUMIN 4.1   No results for input(s): LIPASE, AMYLASE in the last 168 hours. Recent Labs  Lab 09/04/20 0819  AMMONIA <9*   Coagulation Profile: Recent Labs  Lab 09/02/20 0018  INR 1.1   Cardiac Enzymes: No results for input(s): CKTOTAL, CKMB, CKMBINDEX, TROPONINI in the last 168 hours. BNP (last 3 results) No results for input(s): PROBNP in the last 8760 hours. HbA1C: No results for input(s): HGBA1C in the last 72 hours. CBG: No results for input(s): GLUCAP in the last 168 hours. Lipid Profile: No results for input(s): CHOL, HDL, LDLCALC, TRIG, CHOLHDL, LDLDIRECT in the last 72 hours. Thyroid Function Tests: No results for input(s): TSH, T4TOTAL, FREET4, T3FREE, THYROIDAB in the last 72 hours. Anemia Panel: No results for input(s): VITAMINB12, FOLATE, FERRITIN, TIBC, IRON, RETICCTPCT in the last 72 hours. Sepsis Labs: No results for input(s): PROCALCITON, LATICACIDVEN in the last 168 hours.  Recent Results (from the past 240 hour(s))  Resp Panel by RT-PCR (Flu A&B, Covid) Nasopharyngeal Swab     Status: None   Collection Time: 09/02/20  1:26 AM   Specimen: Nasopharyngeal Swab; Nasopharyngeal(NP) swabs in vial transport medium  Result Value Ref Range Status   SARS Coronavirus 2 by RT PCR NEGATIVE NEGATIVE Final   Influenza A by PCR  NEGATIVE NEGATIVE Final   Influenza B by PCR NEGATIVE NEGATIVE Final  MRSA PCR Screening     Status: None   Collection Time: 09/03/20 11:06 AM   Specimen: Nasal Mucosa; Nasopharyngeal  Result Value Ref Range Status   MRSA by PCR NEGATIVE NEGATIVE Final    Comment:        The GeneXpert MRSA Assay (FDA approved for NASAL specimens only), is one component of a comprehensive MRSA colonization surveillance program. It is not intended to diagnose MRSA infection nor to guide or monitor treatment for MRSA infections. Performed at [facility], [place] [address], [place], [place] [address]      Radiology Studies: No results found.   LOS: 4 days   Alma Friendly, MD [facility]  09/07/2020, 3:52 PM

## 2020-09-07 NOTE — TOC Progression Note (Signed)
Transition of Care [facility]) - Progression Note    Patient Details  Name: Jesus Jordan MRN: [id number] Date of Birth: April 30, 1946  [facility] of [facility] CM/SW Contact  Theresea Trautmann, Marjie Skiff, RN Phone Number: 09/07/2020, 2:02 PM  Clinical Narrative:     Spoke with daughter Jesus Jordan again today for dc planning. Jesus Jordan would like pt to go to SNF for short term rehab to allow her to have time to set up long term care for pt. FL2 faxed out to area facilities. TOC will follow with bed offers when available.   Expected Discharge Plan: [place] Barriers to Discharge: Continued Medical Work up  Expected Discharge Plan and Services Expected Discharge Plan: [place]   Discharge Planning Services: CM Consult   Living arrangements for the past 2 months: Single Family Home                    Social Determinants of Health (SDOH) Interventions    Readmission Risk Interventions No flowsheet data found.

## 2020-09-08 ENCOUNTER — Telehealth: Payer: Self-pay | Admitting: Family Medicine

## 2020-09-08 DIAGNOSIS — S72001A Fracture of unspecified part of neck of right femur, initial encounter for closed fracture: Secondary | ICD-10-CM | POA: Diagnosis not present

## 2020-09-08 DIAGNOSIS — F1010 Alcohol abuse, uncomplicated: Secondary | ICD-10-CM | POA: Diagnosis not present

## 2020-09-08 DIAGNOSIS — I10 Essential (primary) hypertension: Secondary | ICD-10-CM | POA: Diagnosis not present

## 2020-09-08 DIAGNOSIS — F0280 Dementia in other diseases classified elsewhere without behavioral disturbance: Secondary | ICD-10-CM | POA: Diagnosis not present

## 2020-09-08 LAB — RESP PANEL BY RT-PCR (FLU A&B, COVID) ARPGX2
Influenza A by PCR: NEGATIVE
Influenza B by PCR: NEGATIVE
SARS Coronavirus 2 by RT PCR: NEGATIVE

## 2020-09-08 MED ORDER — CHLORDIAZEPOXIDE HCL 5 MG PO CAPS
25.0000 mg | ORAL_CAPSULE | Freq: Every day | ORAL | Status: AC
  Administered 2020-09-09: 25 mg via ORAL
  Filled 2020-09-08: qty 5

## 2020-09-08 NOTE — Telephone Encounter (Signed)
Spoke with [facility], pt was clinically declined. I spoke with pt's daughter, advised her of the information. She has a call into the Environmental education officer there. I advised her I would try to get in touch with one of our social workers to see what help we can get them.

## 2020-09-08 NOTE — Progress Notes (Addendum)
PROGRESS NOTE    Jesus Jordan  KDT:[id number] DOB: 1945/07/10 DOA: 09/01/2020 PCP: Eulas Post, MD   Chief Complaint  Patient presents with  . Fall    R. Leg Pain   Brief Narrative: 76 year old male with history of hypertension GERD hyperlipidemia, dementia history of alcohol abuse, very functional at baseline with short-term memory issues, brought to the ED after a fall while he was playing tennis 3/15. In the ED imaging concerning for hip fracture, Ortho was consulted and MRI was ordered-which confirmed fracture, underwent right intertrochanteric IM fixation 3/17. Pt received ativan w/ ciwa and was deeply sedated- ativan stopped. 3/18 seen by psych- librium recommended    Subjective: Patient seen and examined at bedside, denies any new complaints.  Friend at bedside. Daughter finding a hard time choosing SNF, despite having bed offers.   Assessment & Plan: Nondisplaced intertrochanteric fracture right femur 2/2 mechanical fall s/p right IM fixation on 3/17 Continue DVT prophylaxis with Lovenox, continue pain management Defer outpatient pain management and DVT prophylaxis to orthopedics team (rec ASA 81 mg BID) PT/OT, vitamin D level is stable.  Acute metabolic encephalopathy/delirium multifactorial suspect alcohol withdrawal given his significant alcohol use history and 2/2 his Dementia, post hip fracture surgery/anesthesia/ hospitalization Improving TSH B12 normal, B1 pending, no evidence of UTI in the urine Baseline with short-term memory issues and at times hallucination Patient was deeply sedated with Ativan on admission Continue Librium, complete taper on 09/09/20 Daughter confirmed he drinks 6 packs of beer daily Appreciate psych team evaluation and follow-up Continue Remeron, Seroquel was added as needed Continue multivitamin folate thiamine.    CAD remote history of a stent/Hyperlipidemia Continue his aspirin, beta-blocker, Zetia and statin.  Hypertension BP  stable continue metoprolol  GERD Continue PPI     Patient's Body mass index is 25.12 kg/m.  DVT prophylaxis: enoxaparin (LOVENOX) injection 40 mg Start: 09/04/20 0800 SCDs Start: 09/03/20 1841. Code Status:   Code Status: Partial Code DNI, okay for CPR. Family Communication: plan of care discussed with patient's daughter at the bedside on 09/06/20, attempted to call daughter on 09/08/20, no answer.  Status is: Inpatient Patient remains hospitalized for ongoing post hip care Dispo: The patient is from: Home              Anticipated d/c is to: SNF    Daughter unable to provide 24/7 assistance at home-but hiring private nurse              Patient currently is medically stable to d/c.   Difficult to place patient No    Medications reviewed:  Scheduled Meds: . allopurinol  300 mg Oral Daily  . aspirin  81 mg Oral QHS  . [START ON 09/09/2020] chlordiazePOXIDE  25 mg Oral Daily  . docusate sodium  100 mg Oral BID  . donepezil  10 mg Oral QHS  . doxycycline  100 mg Oral Daily  . enoxaparin (LOVENOX) injection  40 mg Subcutaneous Q24H  . ezetimibe  10 mg Oral QHS  . feeding supplement  237 mL Oral BID BM  . folic acid  1 mg Oral Daily  . metoprolol succinate  25 mg Oral Daily  . mirtazapine  15 mg Oral QHS  . multivitamin with minerals  1 tablet Oral Daily  . senna-docusate  1 tablet Oral BID  . simvastatin  40 mg Oral q1800  . thiamine  100 mg Oral Daily   Or  . thiamine  100 mg Intravenous Daily   Continuous  Infusions:   Consultants:see note  Procedures:see note  Antimicrobials: Anti-infectives (From admission, onward)   Start     Dose/Rate Route Frequency Ordered Stop   09/03/20 2200  ceFAZolin (ANCEF) IVPB 2g/100 mL premix        2 g 200 mL/hr over 30 Minutes Intravenous Every 6 hours 09/03/20 1840 09/04/20 1955   09/03/20 1130  ceFAZolin (ANCEF) IVPB 2g/100 mL premix        2 g 200 mL/hr over 30 Minutes Intravenous On call to O.R. 09/03/20 1040 09/03/20 1629    09/02/20 1745  doxycycline (VIBRA-TABS) tablet 100 mg       Note to Pharmacy: For rosacea/continuous     100 mg Oral Daily 09/02/20 1654       Culture/Microbiology    Component Value Date/Time   SDES FLUID SYNOVIAL KNEE LEFT 09/16/2008 1258   SPECREQUEST NONE 09/16/2008 1258   CULT NO GROWTH 3 DAYS 09/16/2008 1258   REPTSTATUS 09/20/2008 FINAL 09/16/2008 1258    Other culture-see note  Objective: Vitals: Today's Vitals   09/07/20 2150 09/08/20 0500 09/08/20 1100 09/08/20 1441  BP:  102/67  106/66  Pulse:  64  71  Resp:  16  16  Temp:  (!) 97.2 F (36.2 C)  98.4 F (36.9 C)  TempSrc:  Oral  Oral  SpO2:  97%  100%  Weight:      Height:      PainSc: 0-No pain  0-No pain     Intake/Output Summary (Last 24 hours) at 09/08/2020 1747 Last data filed at 09/08/2020 1330 Gross per 24 hour  Intake 480 ml  Output --  Net 480 ml   Filed Weights   09/01/20 2308 09/02/20 0134  Weight: 79.4 kg 79.4 kg   Weight change:   Intake/Output from previous day: 03/21 0701 - 03/22 0700 In: 480 [P.O.:480] Out: 600 [Urine:600] Intake/Output this shift: Total I/O In: 480 [P.O.:480] Out: -  Filed Weights   09/01/20 2308 09/02/20 0134  Weight: 79.4 kg 79.4 kg    Examination:  General: NAD, awake, alert, oriented  Cardiovascular: S1, S2 present  Respiratory: CTAB  Abdomen: Soft, nontender, nondistended, bowel sounds present  Musculoskeletal: No bilateral pedal edema noted, right thigh with dressing present, C/D/  Skin: Normal  Psychiatry: Normal mood   Data Reviewed: I have personally reviewed following labs and imaging studies CBC: Recent Labs  Lab 09/02/20 0018 09/02/20 1714 09/03/20 2043 09/04/20 0522 09/05/20 0538 09/06/20 0526 09/07/20 0722  WBC 9.0   < > 10.3 11.5* 9.7 8.6 6.9  NEUTROABS 6.6  --   --   --   --   --  3.6  HGB 13.5   < > 14.3 12.9* 13.0 12.0* 12.2*  HCT 40.1   < > 43.0 38.5* 39.3 36.1* 37.2*  MCV 96.9   < > 100.0 98.0 99.0 99.7 99.5  PLT  131*   < > 121* 127* 130* 141* 144*   < > = values in this interval not displayed.   Basic Metabolic Panel: Recent Labs  Lab 09/02/20 1714 09/03/20 2043 09/04/20 0522 09/05/20 0538 09/06/20 0526 09/07/20 0722  NA 137  --  136 139 138 136  K 3.9  --  4.0 3.6 3.9 3.7  CL 104  --  103 103 104 102  CO2 24  --  24 28 23 26   GLUCOSE 97  --  135* 129* 101* 103*  BUN 15  --  17 22 29* 24*  CREATININE 0.81  0.80 0.79 0.85 0.79 0.85  CALCIUM 9.2  --  9.0 9.3 9.0 9.1  MG 2.0  --   --   --   --   --   PHOS 3.0  --   --   --   --   --    GFR: Estimated Creatinine Clearance: 79.9 mL/min (by C-G formula based on SCr of 0.85 mg/dL). Liver Function Tests: Recent Labs  Lab 09/02/20 1714  AST 28  ALT 20  ALKPHOS 64  BILITOT 1.3*  PROT 7.1  ALBUMIN 4.1   No results for input(s): LIPASE, AMYLASE in the last 168 hours. Recent Labs  Lab 09/04/20 0819  AMMONIA <9*   Coagulation Profile: Recent Labs  Lab 09/02/20 0018  INR 1.1   Cardiac Enzymes: No results for input(s): CKTOTAL, CKMB, CKMBINDEX, TROPONINI in the last 168 hours. BNP (last 3 results) No results for input(s): PROBNP in the last 8760 hours. HbA1C: No results for input(s): HGBA1C in the last 72 hours. CBG: No results for input(s): GLUCAP in the last 168 hours. Lipid Profile: No results for input(s): CHOL, HDL, LDLCALC, TRIG, CHOLHDL, LDLDIRECT in the last 72 hours. Thyroid Function Tests: No results for input(s): TSH, T4TOTAL, FREET4, T3FREE, THYROIDAB in the last 72 hours. Anemia Panel: No results for input(s): VITAMINB12, FOLATE, FERRITIN, TIBC, IRON, RETICCTPCT in the last 72 hours. Sepsis Labs: No results for input(s): PROCALCITON, LATICACIDVEN in the last 168 hours.  Recent Results (from the past 240 hour(s))  Resp Panel by RT-PCR (Flu A&B, Covid) Nasopharyngeal Swab     Status: None   Collection Time: 09/02/20  1:26 AM   Specimen: Nasopharyngeal Swab; Nasopharyngeal(NP) swabs in vial transport medium   Result Value Ref Range Status   SARS Coronavirus 2 by RT PCR NEGATIVE NEGATIVE Final   Influenza A by PCR NEGATIVE NEGATIVE Final   Influenza B by PCR NEGATIVE NEGATIVE Final  MRSA PCR Screening     Status: None   Collection Time: 09/03/20 11:06 AM   Specimen: Nasal Mucosa; Nasopharyngeal  Result Value Ref Range Status   MRSA by PCR NEGATIVE NEGATIVE Final    Comment:        The GeneXpert MRSA Assay (FDA approved for NASAL specimens only), is one component of a comprehensive MRSA colonization surveillance program. It is not intended to diagnose MRSA infection nor to guide or monitor treatment for MRSA infections. Performed at [facility], [place] [address], [place], [place] [address]   Resp Panel by RT-PCR (Flu A&B, Covid) Nasopharyngeal Swab     Status: None   Collection Time: 09/08/20 11:35 AM   Specimen: Nasopharyngeal Swab; Nasopharyngeal(NP) swabs in vial transport medium  Result Value Ref Range Status   SARS Coronavirus 2 by RT PCR NEGATIVE NEGATIVE Final    Comment: (NOTE) SARS-CoV-2 target nucleic acids are NOT DETECTED.  The SARS-CoV-2 RNA is generally detectable in upper respiratory specimens during the acute phase of infection. The lowest concentration of SARS-CoV-2 viral copies this assay can detect is 138 copies/mL. A negative result does not preclude SARS-Cov-2 infection and should not be used as the sole basis for treatment or other patient management decisions. A negative result may occur with  improper specimen collection/handling, submission of specimen other than nasopharyngeal swab, presence of viral mutation(s) within the areas targeted by this assay, and inadequate number of viral copies(<138 copies/mL). A negative result must be combined with clinical observations, patient history, and epidemiological information. The expected result is Negative.  Fact Sheet  for Patients:  EntrepreneurPulse.com.au  Fact Sheet for  Healthcare Providers:  IncredibleEmployment.be  This test is no t yet approved or cleared by the [place] FDA and  has been authorized for detection and/or diagnosis of SARS-CoV-2 by FDA under an Emergency Use Authorization (EUA). This EUA will remain  in effect (meaning this test can be used) for the duration of the COVID-19 declaration under Section 564(b)(1) of the Act, 21 U.S.C.section 360bbb-3(b)(1), unless the authorization is terminated  or revoked sooner.       Influenza A by PCR NEGATIVE NEGATIVE Final   Influenza B by PCR NEGATIVE NEGATIVE Final    Comment: (NOTE) The Xpert Xpress SARS-CoV-2/FLU/RSV plus assay is intended as an aid in the diagnosis of influenza from Nasopharyngeal swab specimens and should not be used as a sole basis for treatment. Nasal washings and aspirates are unacceptable for Xpert Xpress SARS-CoV-2/FLU/RSV testing.  Fact Sheet for Patients: EntrepreneurPulse.com.au  Fact Sheet for Healthcare Providers: IncredibleEmployment.be  This test is not yet approved or cleared by the [place] FDA and has been authorized for detection and/or diagnosis of SARS-CoV-2 by FDA under an Emergency Use Authorization (EUA). This EUA will remain in effect (meaning this test can be used) for the duration of the COVID-19 declaration under Section 564(b)(1) of the Act, 21 U.S.C. section 360bbb-3(b)(1), unless the authorization is terminated or revoked.  Performed at [facility], [place] [address] [place], [place] [address]      Radiology Studies: No results found.   LOS: 5 days   Alma Friendly, MD [facility]  09/08/2020, 5:47 PM

## 2020-09-08 NOTE — Telephone Encounter (Signed)
I spoke with [name], she is having a hard time getting pt placed in a rehab facility before he is discharged from the hospital. Case worker at the hospital told pt's daughter that he wouldn't be accepted by any of the local facilities based off his behavior, but the behavior didn't occur until pt was given Ativan. His daughter requested no more Ativan be given to pt after this. I advised [name] that I would call the facility to see why pt was denied and see what can be done.   I called [facility] to inquire about why the request was denied, I left a message for the rehab facility to call me back.

## 2020-09-08 NOTE — TOC Progression Note (Signed)
Transition of Care [facility]) - Progression Note    Patient Details  Name: Manan Olmo MRN: [id number] Date of Birth: Mar 08, 1946  [facility] CM/SW Contact  Akeela Busk, Marjie Skiff, RN Phone Number: 09/08/2020, 4:21 PM  Clinical Narrative:     Damaris Schooner with [name] via phone to give her SNF bed offers. Pt has multiple SNF bed offers which were given to [facility]. [name] asked about [facility] as she would like pt to go there. Spoke with Luellen Pucker at [facility] who states that the clinical team at the facility has declined the pt. This was relayed to [place]. [name] then left me a message that she was going to tour other facilities. This CM attempted to call [name] multiple times this afternoon and left a message that pt was being discharged and she needed to choose a SNF facility. [name] contacted pt staff RN and stated that she is trying to reach my supervisor as I am not "handling things correctly"  This CM contacted my supervisor Olga Coaster and asked her to call [name] as this was [name]'s request.  TOC will continue to follow.   Expected Discharge Plan: [place] Barriers to Discharge: Continued Medical Work up  Expected Discharge Plan and Services Expected Discharge Plan: [place]   Discharge Planning Services: CM Consult   Living arrangements for the past 2 months: Single Family Home                                       Social Determinants of Health (SDOH) Interventions    Readmission Risk Interventions No flowsheet data found.

## 2020-09-08 NOTE — Telephone Encounter (Signed)
Patient daughter is calling and is requesting a call back regarding patient being discharged from the hospital and going to a rehab facility, please advise. CB is [phone].

## 2020-09-08 NOTE — Telephone Encounter (Signed)
I called and spoke with Jesus Jordan. I advised her to speak with a Education officer, museum tomorrow when she goes in to see her dad. She also had questions in regards to a palliative care referral; advised her to ask the RN working with her dad or the charge nurse about seeing if they can start the process for palliative care. She verbalized understanding & expressed her gratitude for our help; she will let us know if she needs any other information.

## 2020-09-08 NOTE — Progress Notes (Signed)
Physical Therapy Treatment Patient Details Name: Jesus Jordan MRN: [id number] DOB: [birth year] Today's Date: 09/08/2020    History of Present Illness Jesus Jordan is a 75 y.o. male with medical history significant of EtOH abuse, HTN, GERD, COPD, MI 2010 with current c/o hip pain after fall while playing tennis. CY and MRI identified intertrochanteric femur fracture, s/p R IM nail 09/03/20.    PT Comments    Pt is progressing well with mobility, he tolerated increased ambulation distance of 33' with RW, no loss of balance. Instructed pt in RLE ROM exercises, encouraged him to perform these 2-3x/day using gait belt looped around R foot for assistance.   Follow Up Recommendations  SNF     Equipment Recommendations  None recommended by PT    Recommendations for Other Services       Precautions / Restrictions Precautions Precautions: Fall Restrictions Weight Bearing Restrictions: No RLE Weight Bearing: Weight bearing as tolerated    Mobility  Bed Mobility               General bed mobility comments: up on commode at start of tx    Transfers Overall transfer level: Needs assistance Equipment used: Rolling walker (2 wheeled) Transfers: Sit to/from Stand Sit to Stand: Min assist         General transfer comment: min assist to steady on rising and transition to RW  Ambulation/Gait Ambulation/Gait assistance: Min guard Gait Distance (Feet): 75 Feet Assistive device: Rolling walker (2 wheeled) Gait Pattern/deviations: Step-to pattern;Decreased weight shift to right;Trunk flexed Gait velocity: decreased   General Gait Details: cues for sequence, RW position and posture   Stairs             Wheelchair Mobility    Modified Rankin (Stroke Patients Only)       Balance   Sitting-balance support: Feet supported;No upper extremity supported Sitting balance-Leahy Scale: Good     Standing balance support: During functional activity Standing balance-Leahy  Scale: Poor Standing balance comment: reliant on RW, initial external assist to maintain static stand                            Cognition Arousal/Alertness: Awake/alert Behavior During Therapy: WFL for tasks assessed/performed Overall Cognitive Status: History of cognitive impairments - at baseline                                 General Comments: pt cooperative, follows one step commands consistently      Exercises Total Joint Exercises Ankle Circles/Pumps: AROM;Both;20 reps;Supine Heel Slides: AAROM;Right;10 reps;Supine Hip ABduction/ADduction: AAROM;Right;10 reps;Supine    General Comments        Pertinent Vitals/Pain Pain Score: 3  Pain Location: RLE Pain Descriptors / Indicators: Sore Pain Intervention(s): Limited activity within patient's tolerance;Monitored during session;Repositioned    Home Living                      Prior Function            PT Goals (current goals can now be found in the care plan section) Acute Rehab PT Goals Patient Stated Goal: "play tennis" PT Goal Formulation: With patient Time For Goal Achievement: 09/18/20 Potential to Achieve Goals: Good Progress towards PT goals: Progressing toward goals    Frequency    Min 3X/week      PT Plan Discharge plan needs to be updated  Co-evaluation              AM-PAC PT "6 Clicks" Mobility   Outcome Measure  Help needed turning from your back to your side while in a flat bed without using bedrails?: A Little Help needed moving from lying on your back to sitting on the side of a flat bed without using bedrails?: A Little Help needed moving to and from a bed to a chair (including a wheelchair)?: A Little Help needed standing up from a chair using your arms (e.g., wheelchair or bedside chair)?: A Little Help needed to walk in hospital room?: A Little Help needed climbing 3-5 steps with a railing? : A Lot 6 Click Score: 17    End of Session  Equipment Utilized During Treatment: Gait belt Activity Tolerance: Patient tolerated treatment well Patient left: with call bell/phone within reach;with family/visitor present;in chair;with nursing/sitter in room Nurse Communication: Mobility status PT Visit Diagnosis: Unsteadiness on feet (R26.81);Other abnormalities of gait and mobility (R26.89);Pain Pain - Right/Left: Right Pain - part of body: Hip     Time: 5789-7847 PT Time Calculation (min) (ACUTE ONLY): 25 min  Charges:  $Gait Training: 8-22 mins $Therapeutic Exercise: 8-22 mins                    Blondell Reveal Kistler PT 09/08/2020  Acute Rehabilitation Services Pager [phone] Office [phone]

## 2020-09-08 NOTE — Telephone Encounter (Signed)
I left Jesus Jordan a message to return my call.

## 2020-09-08 NOTE — Telephone Encounter (Signed)
I reached out to the hospital care management team to see if they can help pt's daughter. Do you have any other ideas?

## 2020-09-08 NOTE — Telephone Encounter (Signed)
I would suggest social worker consult to see if they have any further ideas.

## 2020-09-09 DIAGNOSIS — S72001A Fracture of unspecified part of neck of right femur, initial encounter for closed fracture: Secondary | ICD-10-CM | POA: Diagnosis not present

## 2020-09-09 LAB — BASIC METABOLIC PANEL
Anion gap: 6 (ref 5–15)
BUN: 25 mg/dL — ABNORMAL HIGH (ref 8–23)
CO2: 26 mmol/L (ref 22–32)
Calcium: 8.8 mg/dL — ABNORMAL LOW (ref 8.9–10.3)
Chloride: 103 mmol/L (ref 98–111)
Creatinine, Ser: 0.96 mg/dL (ref 0.61–1.24)
GFR, Estimated: >60 mL/min (ref >60)
Glucose, Bld: 99 mg/dL (ref 70–99)
Potassium: 3.9 mmol/L (ref 3.5–5.1)
Sodium: 135 mmol/L (ref 135–145)

## 2020-09-09 LAB — CBC
HCT: 33.7 % — ABNORMAL LOW (ref 39.0–52.0)
Hemoglobin: 10.9 g/dL — ABNORMAL LOW (ref 13.0–17.0)
MCH: 32.7 pg (ref 26.0–34.0)
MCHC: 32.3 g/dL (ref 30.0–36.0)
MCV: 101.2 fL — ABNORMAL HIGH (ref 80.0–100.0)
Platelets: 174 10*3/uL (ref 150–400)
RBC: 3.33 MIL/uL — ABNORMAL LOW (ref 4.22–5.81)
RDW: 13.6 % (ref 11.5–15.5)
WBC: 7.9 10*3/uL (ref 4.0–10.5)
nRBC: 0 % (ref 0.0–0.2)

## 2020-09-09 NOTE — Progress Notes (Signed)
PROGRESS NOTE    Jesus Jordan  PJS:[id number] DOB: [birth year] DOA: 09/01/2020 PCP: Eulas Post, MD    Brief Narrative: 75 year old male with history of hypertension GERD hyperlipidemia, dementia history of alcohol abuse, very functional at baseline with short-term memory issues, brought to the ED after a fall while he was playing tennis 3/15. In the ED imaging concerning for hip fracture, Ortho was consulted and MRI was ordered-which confirmed fracture, underwent right intertrochanteric IM fixation 3/17. Pt received ativan w/ ciwa and was deeply sedated- ativan stopped. 3/18 seen by psych- librium recommended   Assessment & Plan:   Principal Problem:   Closed right hip fracture [facility]) Active Problems:   Major neurocognitive disorder due to multiple etiologies without behavioral disturbance   Hip fracture (HCC)    Nondisplaced intertrochanteric fracture right femur 2/2 mechanical fall s/p right IM fixation on 3/17 Continue DVT prophylaxis with Lovenox, continue pain management Defer outpatient pain management and DVT prophylaxis to orthopedics team (rec ASA 81 mg BID) PT/OT, vitamin D level is NORMAL  Acute metabolic encephalopathy/delirium multifactorial suspect alcohol withdrawal given his significant alcohol use history and 2/2 his Dementia, post hip fracture surgery/anesthesia/ hospitalization Improving-normal B12 and TSH.  No evidence of UTI.  Reportedly patient has been having short-term memory loss and.'s of hallucination.  Continue Librium taper. Daughter confirmed he drinks 6 packs of beer daily Appreciate psych team evaluation and follow-up Continue Remeron, Seroquel was added as needed Continue multivitamin folate thiamine.    CAD remote history of a stent/Hyperlipidemia Continue his aspirin, beta-blocker, Zetia and statin.  Hypertension BP stable continue metoprolol  GERD Continue PPI    Nutrition Problem: Increased nutrient needs Etiology: hip  fracture,post-op healing   Signs/Symptoms: estimated needs   Interventions: Ensure Surgery,MVI  Estimated body mass index is 25.12 kg/m as calculated from the following:   Height as of this encounter: 5\' 10"  (2.924 m).   Weight as of this encounter: 79.4 kg.  DVT prophylaxis: Lovenox  code Status: Partial code Family Communication: Discussed with daughter Disposition Plan:  Status is: Inpatient  Dispo: The patient is from: Home              Anticipated d/c is to: SNF              Patient currently is medically stable to d/c.   Difficult to place patient no.  Daughter is trying to find a place for him.   Consultants: Ortho  Procedures right hip IM fixation Antimicrobials: None  Subjective: Patient is resting in bed no complaints  Objective: Vitals:   09/08/20 0500 09/08/20 1441 09/08/20 2056 09/09/20 0541  BP: 102/67 106/66 102/62 (!) 96/58  Pulse: 64 71 78 64  Resp: 16 16 16 16   Temp: (!) 97.2 F (36.2 C) 98.4 F (36.9 C) 98.3 F (36.8 C) (!) 97.5 F (36.4 C)  TempSrc: Oral Oral Oral Oral  SpO2: 97% 100% 96% 98%  Weight:      Height:        Intake/Output Summary (Last 24 hours) at 09/09/2020 1233 Last data filed at 09/09/2020 1000 Gross per 24 hour  Intake 480 ml  Output --  Net 480 ml   Filed Weights   09/01/20 2308 09/02/20 0134  Weight: 79.4 kg 79.4 kg    Examination:  General exam: Appears calm and comfortable  Respiratory system: Clear to auscultation. Respiratory effort normal. Cardiovascular system: S1 & S2 heard, RRR. No JVD, murmurs, rubs, gallops or clicks. No pedal edema. Gastrointestinal system:  Abdomen is nondistended, soft and nontender. No organomegaly or masses felt. Normal bowel sounds heard. Central nervous system: Alert and oriented. No focal neurological deficits. Extremities: Right hip incision covered with dressing with some drainage. Skin: No rashes, lesions or ulcers Psychiatry: Judgement and insight appear normal. Mood &  affect appropriate.     Data Reviewed: I have personally reviewed following labs and imaging studies  CBC: Recent Labs  Lab 09/04/20 0522 09/05/20 0538 09/06/20 0526 09/07/20 0722 09/09/20 0533  WBC 11.5* 9.7 8.6 6.9 7.9  NEUTROABS  --   --   --  3.6  --   HGB 12.9* 13.0 12.0* 12.2* 10.9*  HCT 38.5* 39.3 36.1* 37.2* 33.7*  MCV 98.0 99.0 99.7 99.5 101.2*  PLT 127* 130* 141* 144* 102   Basic Metabolic Panel: Recent Labs  Lab 09/02/20 1714 09/03/20 2043 09/04/20 0522 09/05/20 0538 09/06/20 0526 09/07/20 0722 09/09/20 0533  NA 137  --  136 139 138 136 135  K 3.9  --  4.0 3.6 3.9 3.7 3.9  CL 104  --  103 103 104 102 103  CO2 24  --  24 28 23 26 26   GLUCOSE 97  --  135* 129* 101* 103* 99  BUN 15  --  17 22 29* 24* 25*  CREATININE 0.81   < > 0.79 0.85 0.79 0.85 0.96  CALCIUM 9.2  --  9.0 9.3 9.0 9.1 8.8*  MG 2.0  --   --   --   --   --   --   PHOS 3.0  --   --   --   --   --   --    < > = values in this interval not displayed.   GFR: Estimated Creatinine Clearance: 70.8 mL/min (by C-G formula based on SCr of 0.96 mg/dL). Liver Function Tests: Recent Labs  Lab 09/02/20 1714  AST 28  ALT 20  ALKPHOS 64  BILITOT 1.3*  PROT 7.1  ALBUMIN 4.1   No results for input(s): LIPASE, AMYLASE in the last 168 hours. Recent Labs  Lab 09/04/20 0819  AMMONIA <9*   Coagulation Profile: No results for input(s): INR, PROTIME in the last 168 hours. Cardiac Enzymes: No results for input(s): CKTOTAL, CKMB, CKMBINDEX, TROPONINI in the last 168 hours. BNP (last 3 results) No results for input(s): PROBNP in the last 8760 hours. HbA1C: No results for input(s): HGBA1C in the last 72 hours. CBG: No results for input(s): GLUCAP in the last 168 hours. Lipid Profile: No results for input(s): CHOL, HDL, LDLCALC, TRIG, CHOLHDL, LDLDIRECT in the last 72 hours. Thyroid Function Tests: No results for input(s): TSH, T4TOTAL, FREET4, T3FREE, THYROIDAB in the last 72 hours. Anemia  Panel: No results for input(s): VITAMINB12, FOLATE, FERRITIN, TIBC, IRON, RETICCTPCT in the last 72 hours. Sepsis Labs: No results for input(s): PROCALCITON, LATICACIDVEN in the last 168 hours.  Recent Results (from the past 240 hour(s))  Resp Panel by RT-PCR (Flu A&B, Covid) Nasopharyngeal Swab     Status: None   Collection Time: 09/02/20  1:26 AM   Specimen: Nasopharyngeal Swab; Nasopharyngeal(NP) swabs in vial transport medium  Result Value Ref Range Status   SARS Coronavirus 2 by RT PCR NEGATIVE NEGATIVE Final   Influenza A by PCR NEGATIVE NEGATIVE Final   Influenza B by PCR NEGATIVE NEGATIVE Final  MRSA PCR Screening     Status: None   Collection Time: 09/03/20 11:06 AM   Specimen: Nasal Mucosa; Nasopharyngeal  Result Value Ref  Range Status   MRSA by PCR NEGATIVE NEGATIVE Final    Comment:        The GeneXpert MRSA Assay (FDA approved for NASAL specimens only), is one component of a comprehensive MRSA colonization surveillance program. It is not intended to diagnose MRSA infection nor to guide or monitor treatment for MRSA infections. Performed at [facility], [place] [address] [place], [place] [address]   Resp Panel by RT-PCR (Flu A&B, Covid) Nasopharyngeal Swab     Status: None   Collection Time: 09/08/20 11:35 AM   Specimen: Nasopharyngeal Swab; Nasopharyngeal(NP) swabs in vial transport medium  Result Value Ref Range Status   SARS Coronavirus 2 by RT PCR NEGATIVE NEGATIVE Final    Comment: (NOTE) SARS-CoV-2 target nucleic acids are NOT DETECTED.  The SARS-CoV-2 RNA is generally detectable in upper respiratory specimens during the acute phase of infection. The lowest concentration of SARS-CoV-2 viral copies this assay can detect is 138 copies/mL. A negative result does not preclude SARS-Cov-2 infection and should not be used as the sole basis for treatment or other patient management decisions. A negative result may occur with  improper specimen  collection/handling, submission of specimen other than nasopharyngeal swab, presence of viral mutation(s) within the areas targeted by this assay, and inadequate number of viral copies(<138 copies/mL). A negative result must be combined with clinical observations, patient history, and epidemiological information. The expected result is Negative.  Fact Sheet for Patients:  EntrepreneurPulse.com.au  Fact Sheet for Healthcare Providers:  IncredibleEmployment.be  This test is no t yet approved or cleared by the [place] FDA and  has been authorized for detection and/or diagnosis of SARS-CoV-2 by FDA under an Emergency Use Authorization (EUA). This EUA will remain  in effect (meaning this test can be used) for the duration of the COVID-19 declaration under Section 564(b)(1) of the Act, 21 U.S.C.section 360bbb-3(b)(1), unless the authorization is terminated  or revoked sooner.       Influenza A by PCR NEGATIVE NEGATIVE Final   Influenza B by PCR NEGATIVE NEGATIVE Final    Comment: (NOTE) The Xpert Xpress SARS-CoV-2/FLU/RSV plus assay is intended as an aid in the diagnosis of influenza from Nasopharyngeal swab specimens and should not be used as a sole basis for treatment. Nasal washings and aspirates are unacceptable for Xpert Xpress SARS-CoV-2/FLU/RSV testing.  Fact Sheet for Patients: EntrepreneurPulse.com.au  Fact Sheet for Healthcare Providers: IncredibleEmployment.be  This test is not yet approved or cleared by the [place] FDA and has been authorized for detection and/or diagnosis of SARS-CoV-2 by FDA under an Emergency Use Authorization (EUA). This EUA will remain in effect (meaning this test can be used) for the duration of the COVID-19 declaration under Section 564(b)(1) of the Act, 21 U.S.C. section 360bbb-3(b)(1), unless the authorization is terminated or revoked.  Performed at [facility], [place] [address], [place],  [address]          Radiology Studies: No results found.      Scheduled Meds: . allopurinol  300 mg Oral Daily  . aspirin  81 mg Oral QHS  . docusate sodium  100 mg Oral BID  . donepezil  10 mg Oral QHS  . doxycycline  100 mg Oral Daily  . enoxaparin (LOVENOX) injection  40 mg Subcutaneous Q24H  . ezetimibe  10 mg Oral QHS  . feeding supplement  237 mL Oral BID BM  . folic acid  1 mg Oral Daily  . metoprolol succinate  25 mg Oral Daily  . mirtazapine  15 mg Oral QHS  . multivitamin with minerals  1 tablet Oral Daily  . senna-docusate  1 tablet Oral BID  . simvastatin  40 mg Oral q1800  . thiamine  100 mg Oral Daily   Or  . thiamine  100 mg Intravenous Daily   Continuous Infusions:   LOS: 6 days   Georgette Shell, MD  09/09/2020, 12:33 PM

## 2020-09-09 NOTE — Progress Notes (Signed)
Nutrition Follow-up  INTERVENTION:   -Ensure Surgery po BID, each supplement provides 330 kcal and 18 grams of protein  NUTRITION DIAGNOSIS:   Increased nutrient needs related to hip fracture,post-op healing as evidenced by estimated needs.  Ongoing.  GOAL:   Patient will meet greater than or equal to 90% of their needs  Progressing.  MONITOR:   PO intake,Supplement acceptance,Labs,Weight trends,I & O's  ASSESSMENT:   75 year old male with past medical history of coronary artery disease with stent, COPD, hypertension, EtOH abuse and what daughter describes as "memory issues".  Patient presenting today for evaluation of a fall.  Admitted for right hip fracture.  3/17: s/p Intramedullary fixation, Right femur   Patient currently consuming 100% of meals. Patient has not been drinking Ensure Surgery but noted that one was accepted this morning. Per chart review, plan is for SNF at discharge.  Admission weight: 175 lbs.  Medications: colace, Folic acid, Remeron, Multivitamin with minerals daily, Senokot, Thiamine  Labs reviewed.   Diet Order:   Diet Order            Diet regular Room service appropriate? Yes; Fluid consistency: Thin  Diet effective now                 EDUCATION NEEDS:   No education needs have been identified at this time  Skin:  Skin Assessment: Skin Integrity Issues: Skin Integrity Issues:: Incisions Incisions: hip 3/17  Last BM:  3/20  Height:   Ht Readings from Last 1 Encounters:  09/02/20 5\' 10"  (1.778 m)    Weight:   Wt Readings from Last 1 Encounters:  09/02/20 79.4 kg    BMI:  Body mass index is 25.12 kg/m.  Estimated Nutritional Needs:   Kcal:  2100-2300  Protein:  90-100g  Fluid:  2.1L/day  Jesus Bibles, MS, RD, LDN Inpatient Clinical Dietitian Contact information available via Amion

## 2020-09-09 NOTE — TOC Progression Note (Signed)
Transition of Care [facility]) - Progression Note    Patient Details  Name: Jesus Jordan MRN: [id number] Date of Birth: [birth year]  Transition of [facility]) CM/SW Contact  Mccade Sullenberger, Marjie Skiff, RN Phone Number: 09/09/2020, 1:58 PM  Clinical Narrative:     Per MD daughter is trying to get pt into [facility] as private pay. TOC supervisor made aware of plan. TOC will continue to follow.  Expected Discharge Plan: [place] Barriers to Discharge: Continued Medical Work up  Expected Discharge Plan and Services Expected Discharge Plan: [place]   Discharge Planning Services: CM Consult   Living arrangements for the past 2 months: Single Family Home                   Readmission Risk Interventions No flowsheet data found.

## 2020-09-10 DIAGNOSIS — J449 Chronic obstructive pulmonary disease, unspecified: Secondary | ICD-10-CM | POA: Diagnosis present

## 2020-09-10 DIAGNOSIS — I10 Essential (primary) hypertension: Secondary | ICD-10-CM | POA: Diagnosis present

## 2020-09-10 DIAGNOSIS — Z79899 Other long term (current) drug therapy: Secondary | ICD-10-CM | POA: Diagnosis not present

## 2020-09-10 DIAGNOSIS — R2681 Unsteadiness on feet: Secondary | ICD-10-CM | POA: Diagnosis present

## 2020-09-10 DIAGNOSIS — M2550 Pain in unspecified joint: Secondary | ICD-10-CM | POA: Diagnosis not present

## 2020-09-10 DIAGNOSIS — R5381 Other malaise: Secondary | ICD-10-CM | POA: Diagnosis not present

## 2020-09-10 DIAGNOSIS — R079 Chest pain, unspecified: Secondary | ICD-10-CM | POA: Diagnosis present

## 2020-09-10 DIAGNOSIS — R278 Other lack of coordination: Secondary | ICD-10-CM | POA: Diagnosis present

## 2020-09-10 DIAGNOSIS — I252 Old myocardial infarction: Secondary | ICD-10-CM | POA: Diagnosis not present

## 2020-09-10 DIAGNOSIS — I2510 Atherosclerotic heart disease of native coronary artery without angina pectoris: Secondary | ICD-10-CM | POA: Diagnosis present

## 2020-09-10 DIAGNOSIS — R2689 Other abnormalities of gait and mobility: Secondary | ICD-10-CM | POA: Diagnosis present

## 2020-09-10 DIAGNOSIS — S72001A Fracture of unspecified part of neck of right femur, initial encounter for closed fracture: Secondary | ICD-10-CM | POA: Diagnosis present

## 2020-09-10 DIAGNOSIS — R52 Pain, unspecified: Secondary | ICD-10-CM | POA: Diagnosis not present

## 2020-09-10 DIAGNOSIS — R410 Disorientation, unspecified: Secondary | ICD-10-CM | POA: Diagnosis not present

## 2020-09-10 DIAGNOSIS — R402411 Glasgow coma scale score 13-15, in the field [EMT or ambulance]: Secondary | ICD-10-CM | POA: Diagnosis not present

## 2020-09-10 DIAGNOSIS — R488 Other symbolic dysfunctions: Secondary | ICD-10-CM | POA: Diagnosis present

## 2020-09-10 DIAGNOSIS — G4089 Other seizures: Secondary | ICD-10-CM | POA: Diagnosis present

## 2020-09-10 DIAGNOSIS — F0390 Unspecified dementia without behavioral disturbance: Secondary | ICD-10-CM | POA: Diagnosis present

## 2020-09-10 DIAGNOSIS — Z7401 Bed confinement status: Secondary | ICD-10-CM | POA: Diagnosis not present

## 2020-09-10 DIAGNOSIS — M6281 Muscle weakness (generalized): Secondary | ICD-10-CM | POA: Diagnosis present

## 2020-09-10 DIAGNOSIS — M810 Age-related osteoporosis without current pathological fracture: Secondary | ICD-10-CM | POA: Diagnosis not present

## 2020-09-10 DIAGNOSIS — Z4789 Encounter for other orthopedic aftercare: Secondary | ICD-10-CM | POA: Diagnosis not present

## 2020-09-10 DIAGNOSIS — E785 Hyperlipidemia, unspecified: Secondary | ICD-10-CM | POA: Diagnosis present

## 2020-09-10 DIAGNOSIS — K219 Gastro-esophageal reflux disease without esophagitis: Secondary | ICD-10-CM | POA: Diagnosis present

## 2020-09-10 DIAGNOSIS — S72144D Nondisplaced intertrochanteric fracture of right femur, subsequent encounter for closed fracture with routine healing: Secondary | ICD-10-CM | POA: Diagnosis not present

## 2020-09-10 MED ORDER — THIAMINE HCL 100 MG PO TABS: 100.0000 mg | ORAL_TABLET | Freq: Every day | ORAL | Status: DC

## 2020-09-10 MED ORDER — FOLIC ACID 1 MG PO TABS: 1.0000 mg | ORAL_TABLET | Freq: Every day | ORAL | Status: DC

## 2020-09-10 MED ORDER — ONDANSETRON HCL 4 MG PO TABS: 4.0000 mg | ORAL_TABLET | Freq: Four times a day (QID) | ORAL | 0 refills | Status: DC | PRN

## 2020-09-10 MED ORDER — QUETIAPINE FUMARATE 25 MG PO TABS: 25.0000 mg | ORAL_TABLET | Freq: Every evening | ORAL | Status: DC | PRN

## 2020-09-10 MED ORDER — ADULT MULTIVITAMIN W/MINERALS CH: 1.0000 | ORAL_TABLET | Freq: Every day | ORAL | Status: DC

## 2020-09-10 MED ORDER — POLYETHYLENE GLYCOL 3350 17 G PO PACK: 17.0000 g | PACK | Freq: Every day | ORAL | 0 refills | Status: DC | PRN

## 2020-09-10 MED ORDER — SENNOSIDES-DOCUSATE SODIUM 8.6-50 MG PO TABS: 1.0000 | ORAL_TABLET | Freq: Two times a day (BID) | ORAL | Status: DC

## 2020-09-10 NOTE — TOC Transition Note (Signed)
Transition of Care [facility]) - CM/SW Discharge Note   Patient Details  Name: Jesus Jordan MRN: [id number] Date of Birth: [birth year]  Transition of Care [facility]) CM/SW Contact:  Lynnell Catalan, RN Phone Number: 09/10/2020, 10:26 AM   Clinical Narrative:     Was alerted by pt daughter that she chooses [facility] for pt. [facility] alerted of bed acceptance. [facility] to transport pt to [facility]. RN to call report [phone].    Barriers to Discharge: Continued Medical Work up       Discharge Plan and Services   Discharge Planning Services: CM Consult                Readmission Risk Interventions No flowsheet data found.

## 2020-09-10 NOTE — Progress Notes (Addendum)
Report called to [place] at [facility]. All questions answered.  Prescription for tramadol and aspirin placed in pt discharge packet to go to [facility].  All pt belongings placed in pt belonging bag.

## 2020-09-10 NOTE — Discharge Summary (Addendum)
Physician Discharge Summary  Jesus Jordan FBP:[id number] DOB: 10-10-1945 DOA: 09/01/2020  PCP: Eulas Post, MD  Admit date: 09/01/2020 Discharge date: 09/10/2020  Admitted From: Home Disposition: Nursing home Recommendations for Outpatient Follow-up:  1. Follow up with PCP in 1-2 weeks 2. Please obtain BMP/CBC in one week 3. Please follow up with Ortho in 2 weeks  Home Health none Equipment/Devices none Discharge Condition stable CODE STATUS full code Diet recommendation cardiac Brief/Interim Summary:75 year old male with history of hypertension GERD hyperlipidemia, dementia history of alcohol abuse, very functional at baseline with short-term memory issues, brought to the ED after a fall while he was playing tennis 3/15. In the ED imaging concerning for hip fracture, Ortho was consulted and MRI was ordered-which confirmed fracture, underwent right intertrochanteric IM fixation 3/17. Pt received ativan w/ ciwa and was deeply sedated- ativan stopped. 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  Ortho has already given him a prescription for aspirin and tramadol.  Acute metabolic encephalopathy/delirium multifactorial suspect alcohol withdrawal given his significant alcohol use history and 2/2 his Dementia, post hip fracture surgery/anesthesia/ hospitalization-resolved normal B12 and TSH.  No evidence of UTI.  Reportedly patient has been having short-term memory loss and.'s of hallucination.    Patient received Librium as a taper and did well with the taper.   Daughter confirmed he drinks 6 packs of beer daily Appreciate  psych team evaluation and follow-up Continue Remeron, Seroquel was added as needed Continue multivitamin folate thiamine.   CAD remote history of a stent/Hyperlipidemia Continue his aspirin, beta-blocker, Zetia and statin.  Hypertension BP stable continue metoprolol  GERD Continue PPI    [place]-patient is on doxycycline 100 mg daily chronically.  This needs to be continued for a long time.    Nutrition Problem: Increased nutrient needs Etiology: hip fracture,post-op healing    Signs/Symptoms: estimated needs     Interventions: Ensure Surgery,MVI  Estimated body mass index is 25.12 kg/m as calculated from the following:   Height as of this encounter: 5\' 10"  (1.778 m).   Weight as of this encounter: 79.4 kg.  Discharge Instructions  Discharge Instructions    Diet - low sodium heart healthy   Complete by: As directed    Diet - low sodium heart healthy   Complete by: As directed    Increase activity slowly   Complete by: As directed    Increase activity slowly   Complete by: As directed    No wound care   Complete by: As directed    No wound care   Complete by: As directed      Allergies as of 09/10/2020      Reactions   Imdur [isosorbide Nitrate] Other (See Comments)   Caused vertigo   Lisinopril Other (See Comments)   Possible vertigo??      Medication List    STOP taking these medications   aspirin 81 MG tablet Replaced by: aspirin 81 MG chewable tablet   desoximetasone 0.25 % cream Commonly known as: TOPICORT   doxycycline 100 MG tablet Commonly known as: VIBRA-TABS     TAKE these medications   acetaminophen 325 MG tablet Commonly known as: TYLENOL Take  1-2 tablets (325-650 mg total) by mouth every 6 (six) hours as needed for mild pain (pain score 1-3 or temp > 100.5).   allopurinol 300 MG tablet Commonly known as: ZYLOPRIM Take 1 tablet (300 mg total) by mouth daily.   aspirin 81 MG chewable tablet Chew 1 tablet (81 mg total) by  mouth 2 (two) times daily with a meal. Replaces: aspirin 81 MG tablet   donepezil 10 MG tablet Commonly known as: Aricept Take 1 tablet (10 mg total) by mouth at bedtime.   ezetimibe 10 MG tablet Commonly known as: ZETIA Take 1 tablet by mouth once daily What changed: when to take this   folic acid 1 MG tablet Commonly known as: FOLVITE Take 1 tablet (1 mg total) by mouth daily. Start taking on: September 11, 2020   HYDROcodone-acetaminophen 5-325 MG tablet Commonly known as: NORCO/VICODIN Take 1-2 tablets by mouth every 6 (six) hours as needed for up to 7 days for moderate pain.   metoprolol succinate 25 MG 24 hr tablet Commonly known as: TOPROL-XL Take 1 tablet (25 mg total) by mouth daily.   mirtazapine 15 MG tablet Commonly known as: REMERON Take 15 mg by mouth at bedtime.   multivitamin with minerals Tabs tablet Take 1 tablet by mouth daily. Start taking on: September 11, 2020   ondansetron 4 MG tablet Commonly known as: ZOFRAN Take 1 tablet (4 mg total) by mouth every 6 (six) hours as needed for nausea.   polyethylene glycol 17 g packet Commonly known as: MIRALAX / GLYCOLAX Take 17 g by mouth daily as needed for mild constipation.   QUEtiapine 25 MG tablet Commonly known as: SEROQUEL Take 1 tablet (25 mg total) by mouth at bedtime as needed (agitaiton and aggression).   senna-docusate 8.6-50 MG tablet Commonly known as: Senokot-S Take 1 tablet by mouth 2 (two) times daily.   simvastatin 40 MG tablet Commonly known as: ZOCOR TAKE 1 TABLET BY MOUTH AT BEDTIME What changed: when to take this   tetrahydrozoline-zinc 0.05-0.25 % ophthalmic solution Commonly known as: VISINE-AC Place 2 drops into both eyes 3 (three) times daily as needed (for itching ot redness).   thiamine 100 MG tablet Take 1 tablet (100 mg total) by mouth daily. Start taking on: September 11, 2020   traMADol 50 MG tablet Commonly known as: ULTRAM Take 1 tablet (50 mg total) by mouth every 6  (six) hours as needed for moderate pain.            Durable Medical Equipment  (From admission, onward)         Start     Ordered   09/04/20 1255  For home use only DME 3 n 1  Once        09/04/20 1254   09/04/20 1255  For home use only DME Walker rolling  Once       Question Answer Comment  Walker: With 5 Inch Wheels   Patient needs a walker to treat with the following condition Weakness      09/04/20 1254          Follow-up Information    Swinteck, Aaron Edelman, MD. Schedule an appointment as soon as possible for a visit in 2 weeks.   Specialty: Orthopedic Surgery Why: For wound re-check, For suture removal Contact information: [address] [place] [address] [phone]        Eulas Post, MD Follow up.   Specialty: Family Medicine Contact information: [phone] [place] [address] [phone]        Troy Sine, MD .   Specialty: Cardiology Contact information: [address] [place] [address] [phone]              Allergies  Allergen Reactions  . Imdur [Isosorbide Nitrate] Other (See Comments)    Caused vertigo  . Lisinopril Other (See Comments)    Possible vertigo??    Consultations: Ortho Procedures/Studies: Pelvis Portable  Result Date: 09/03/2020 CLINICAL DATA:  Status post right IM nail EXAM: PORTABLE PELVIS 1-2 VIEWS COMPARISON:  09/03/2020, 09/02/2020 FINDINGS: Interval intramedullary rod and distal screw fixation of the right femur for nondisplaced intertrochanteric fracture. Gas in the soft tissues consistent with recent surgery. Intact hardware and normal alignment. Vascular calcifications. IMPRESSION: Interval surgical fixation of nondisplaced right intertrochanteric fracture. Electronically Signed   By: Donavan Foil M.D.   On: 09/03/2020 18:28   CT Hip Right Wo Contrast  Result Date: 09/02/2020 CLINICAL DATA:  Fall with right hip pain EXAM: CT OF THE RIGHT HIP  WITHOUT CONTRAST TECHNIQUE: Multidetector CT imaging of the right hip was performed according to the standard protocol. Multiplanar CT image reconstructions were also generated. COMPARISON:  Radiograph 09/02/2020 FINDINGS: Bones/Joint/Cartilage Best seen on the coronal images is an obliquely oriented irregular linear lucency that extends from the lesser trochanter towards the greater trochanter, coronal series 6, image number 49, though lucency through the greater trochanter is not readily identified. Right femoral head alignment is normal. There may be faint linear lucency in cortical step-off at the lower aspect of the greater trochanter, coronal series 6, image number 33. The right pubic rami appear intact. Ligaments Suboptimally assessed by CT. Muscles and Tendons No significant atrophy no intramuscular fluid collections. Soft tissues Suspected soft tissue edema about the proximal femur. No large hip effusion. Vascular calcifications in the groin and pelvis IMPRESSION: 1. Obliquely oriented irregular linear lucency best seen on coronal images in the region of lesser trochanter with poorly visible cranial extent. Findings raise concern for subtle nondisplaced trochanteric fracture. Correlation with MRI should be considered. Electronically Signed   By: Donavan Foil M.D.   On: 09/02/2020 01:11   MR HIP RIGHT WO CONTRAST  Result Date: 09/02/2020 CLINICAL DATA:  Fall, right hip pain, abnormal CT EXAM: MR OF THE RIGHT HIP WITHOUT CONTRAST TECHNIQUE: Multiplanar, multisequence MR imaging was performed. No intravenous contrast was administered. COMPARISON:  09/02/2020 FINDINGS: Bones: There is linear marrow edema within the intertrochanteric region of the proximal right femur, compatible with the nondisplaced fracture seen on recent CT. The subtle cortical discontinuity seen on CT is not apparent by MR. Remaining bony structures demonstrate normal signal characteristics. Articular cartilage and labrum Articular  cartilage:  Mild diffuse articular cartilage thinning. Labrum:  No gross labral pathology. Joint or bursal effusion Joint effusion:  No evidence of joint effusion. Bursae: Unremarkable. Muscles and tendons Muscles and tendons: There is edema throughout the adductor musculature and quadratus femoris within the proximal right thigh. Minimal edema within the gluteal musculature. Other findings Miscellaneous: Intrapelvic structures are grossly unremarkable. Normal signal flow voids within the vasculature. IMPRESSION: 1. Marrow edema within the intertrochanteric region of the proximal right femur, compatible with nondisplaced fracture as seen on previous CT. 2. Edema within the adductor musculature, quadratus femoris, and gluteal musculature as above. Electronically Signed   By: Randa Ngo M.D.   On: 09/02/2020 22:27   DG C-Arm 1-60 Min-No Report  Result Date: 09/03/2020 Fluoroscopy was utilized by  the requesting physician.  No radiographic interpretation.   DG HIP OPERATIVE UNILAT WITH PELVIS RIGHT  Result Date: 09/03/2020 CLINICAL DATA:  Right hip fracture. EXAM: OPERATIVE right HIP (WITH PELVIS IF PERFORMED) 4 VIEWS TECHNIQUE: Fluoroscopic spot image(s) were submitted for interpretation post-operatively. Radiation exposure index: 3.6602 mGy. COMPARISON:  September 01, 2020. FINDINGS: Four intraoperative fluoroscopic images demonstrate surgical internal fixation of right proximal femoral fracture. Good alignment of fracture components is noted. IMPRESSION: Fluoroscopic guidance provided during right hip surgery. Electronically Signed   By: Marijo Conception M.D.   On: 09/03/2020 17:44   DG Hip Unilat W or Wo Pelvis 2-3 Views Right  Result Date: 09/02/2020 CLINICAL DATA:  Fall with hip pain EXAM: DG HIP (WITH OR WITHOUT PELVIS) 2-3V RIGHT COMPARISON:  None. FINDINGS: Extensive vascular calcifications. SI joints are non widened. Pubic symphysis and rami are intact. Questionable fracture lucency at the  intertrochanteric right femur. IMPRESSION: Questionable subtle fracture lucency at the intertrochanteric right femur. Suggest CT for further evaluation. Electronically Signed   By: Donavan Foil M.D.   On: 09/02/2020 00:08    (Echo, Carotid, EGD, Colonoscopy, ERCP)    Subjective: Patient resting in bed anxious to go to rehab no specific complaints or events overnight  Discharge Exam: Vitals:   09/10/20 0618 09/10/20 0900  BP: 92/61 90/65  Pulse: 60 70  Resp: 16   Temp: 97.8 F (36.6 C)   SpO2: 94%    Vitals:   09/09/20 1314 09/09/20 2102 09/10/20 0618 09/10/20 0900  BP: 109/69 (!) 90/53 92/61 90/65   Pulse: 66 67 60 70  Resp: 18 16 16    Temp: 98.3 F (36.8 C) 97.8 F (36.6 C) 97.8 F (36.6 C)   TempSrc: Oral Oral Oral   SpO2: 99% 99% 94%   Weight:      Height:        General: Pt is alert, awake, not in acute distress Cardiovascular: RRR, S1/S2 +, no rubs, no gallops Respiratory: CTA bilaterally, no wheezing, no rhonchi Abdominal: Soft, NT, ND, bowel sounds + Extremities: no edema, no cyanosis.  Right hip incision covered with dressing with some soilage noted on the dressing.    The results of significant diagnostics from this hospitalization (including imaging, microbiology, ancillary and laboratory) are listed below for reference.     Microbiology: Recent Results (from the past 240 hour(s))  Resp Panel by RT-PCR (Flu A&B, Covid) Nasopharyngeal Swab     Status: None   Collection Time: 09/02/20  1:26 AM   Specimen: Nasopharyngeal Swab; Nasopharyngeal(NP) swabs in vial transport medium  Result Value Ref Range Status   SARS Coronavirus 2 by RT PCR NEGATIVE NEGATIVE Final   Influenza A by PCR NEGATIVE NEGATIVE Final   Influenza B by PCR NEGATIVE NEGATIVE Final  MRSA PCR Screening     Status: None   Collection Time: 09/03/20 11:06 AM   Specimen: Nasal Mucosa; Nasopharyngeal  Result Value Ref Range Status   MRSA by PCR NEGATIVE NEGATIVE Final    Comment:         The GeneXpert MRSA Assay (FDA approved for NASAL specimens only), is one component of a comprehensive MRSA colonization surveillance program. It is not intended to diagnose MRSA infection nor to guide or monitor treatment for MRSA infections. Performed at [facility], [place] [address] [place], [place] [address]   Resp Panel by RT-PCR (Flu A&B, Covid) Nasopharyngeal Swab     Status: None   Collection Time: 09/08/20 11:35 AM  Specimen: Nasopharyngeal Swab; Nasopharyngeal(NP) swabs in vial transport medium  Result Value Ref Range Status   SARS Coronavirus 2 by RT PCR NEGATIVE NEGATIVE Final    Comment: (NOTE) SARS-CoV-2 target nucleic acids are NOT DETECTED.  The SARS-CoV-2 RNA is generally detectable in upper respiratory specimens during the acute phase of infection. The lowest concentration of SARS-CoV-2 viral copies this assay can detect is 138 copies/mL. A negative result does not preclude SARS-Cov-2 infection and should not be used as the sole basis for treatment or other patient management decisions. A negative result may occur with  improper specimen collection/handling, submission of specimen other than nasopharyngeal swab, presence of viral mutation(s) within the areas targeted by this assay, and inadequate number of viral copies(<138 copies/mL). A negative result must be combined with clinical observations, patient history, and epidemiological information. The expected result is Negative.  Fact Sheet for Patients:  EntrepreneurPulse.com.au  Fact Sheet for Healthcare Providers:  IncredibleEmployment.be  This test is no t yet approved or cleared by the [place] FDA and  has been authorized for detection and/or diagnosis of SARS-CoV-2 by FDA under an Emergency Use Authorization (EUA). This EUA will remain  in effect (meaning this test can be used) for the duration of the COVID-19 declaration under Section  564(b)(1) of the Act, 21 U.S.C.section 360bbb-3(b)(1), unless the authorization is terminated  or revoked sooner.       Influenza A by PCR NEGATIVE NEGATIVE Final   Influenza B by PCR NEGATIVE NEGATIVE Final    Comment: (NOTE) The Xpert Xpress SARS-CoV-2/FLU/RSV plus assay is intended as an aid in the diagnosis of influenza from Nasopharyngeal swab specimens and should not be used as a sole basis for treatment. Nasal washings and aspirates are unacceptable for Xpert Xpress SARS-CoV-2/FLU/RSV testing.  Fact Sheet for Patients: EntrepreneurPulse.com.au  Fact Sheet for Healthcare Providers: IncredibleEmployment.be  This test is not yet approved or cleared by the [place] FDA and has been authorized for detection and/or diagnosis of SARS-CoV-2 by FDA under an Emergency Use Authorization (EUA). This EUA will remain in effect (meaning this test can be used) for the duration of the COVID-19 declaration under Section 564(b)(1) of the Act, 21 U.S.C. section 360bbb-3(b)(1), unless the authorization is terminated or revoked.  Performed at [facility], [place] [address], [place], [place] [address]      Labs: BNP (last 3 results) No results for input(s): BNP in the last 8760 hours. Basic Metabolic Panel: Recent Labs  Lab 09/04/20 0522 09/05/20 0538 09/06/20 0526 09/07/20 0722 09/09/20 0533  NA 136 139 138 136 135  K 4.0 3.6 3.9 3.7 3.9  CL 103 103 104 102 103  CO2 24 28 23 26 26   GLUCOSE 135* 129* 101* 103* 99  BUN 17 22 29* 24* 25*  CREATININE 0.79 0.85 0.79 0.85 0.96  CALCIUM 9.0 9.3 9.0 9.1 8.8*   Liver Function Tests: No results for input(s): AST, ALT, ALKPHOS, BILITOT, PROT, ALBUMIN in the last 168 hours. No results for input(s): LIPASE, AMYLASE in the last 168 hours. Recent Labs  Lab 09/04/20 0819  AMMONIA <9*   CBC: Recent Labs  Lab 09/04/20 0522 09/05/20 0538 09/06/20 0526 09/07/20 0722  09/09/20 0533  WBC 11.5* 9.7 8.6 6.9 7.9  NEUTROABS  --   --   --  3.6  --   HGB 12.9* 13.0 12.0* 12.2* 10.9*  HCT 38.5* 39.3 36.1* 37.2* 33.7*  MCV 98.0 99.0 99.7 99.5 101.2*  PLT 127* 130* 141* 144* 174  Cardiac Enzymes: No results for input(s): CKTOTAL, CKMB, CKMBINDEX, TROPONINI in the last 168 hours. BNP: Invalid input(s): POCBNP CBG: No results for input(s): GLUCAP in the last 168 hours. D-Dimer No results for input(s): DDIMER in the last 72 hours. Hgb A1c No results for input(s): HGBA1C in the last 72 hours. Lipid Profile No results for input(s): CHOL, HDL, LDLCALC, TRIG, CHOLHDL, LDLDIRECT in the last 72 hours. Thyroid function studies No results for input(s): TSH, T4TOTAL, T3FREE, THYROIDAB in the last 72 hours.  Invalid input(s): FREET3 Anemia work up No results for input(s): VITAMINB12, FOLATE, FERRITIN, TIBC, IRON, RETICCTPCT in the last 72 hours. Urinalysis    Component Value Date/Time   COLORURINE YELLOW 09/04/2020 [place] 09/04/2020 0849   LABSPEC 1.011 09/04/2020 0849   PHURINE 5.0 09/04/2020 0849   GLUCOSEU NEGATIVE 09/04/2020 0849   HGBUR SMALL (A) 09/04/2020 0849   BILIRUBINUR NEGATIVE 09/04/2020 0849   KETONESUR 5 (A) 09/04/2020 0849   PROTEINUR NEGATIVE 09/04/2020 0849   NITRITE NEGATIVE 09/04/2020 0849   LEUKOCYTESUR NEGATIVE 09/04/2020 0849   Sepsis Labs Invalid input(s): PROCALCITONIN,  WBC,  LACTICIDVEN Microbiology Recent Results (from the past 240 hour(s))  Resp Panel by RT-PCR (Flu A&B, Covid) Nasopharyngeal Swab     Status: None   Collection Time: 09/02/20  1:26 AM   Specimen: Nasopharyngeal Swab; Nasopharyngeal(NP) swabs in vial transport medium  Result Value Ref Range Status   SARS Coronavirus 2 by RT PCR NEGATIVE NEGATIVE Final   Influenza A by PCR NEGATIVE NEGATIVE Final   Influenza B by PCR NEGATIVE NEGATIVE Final  MRSA PCR Screening     Status: None   Collection Time: 09/03/20 11:06 AM   Specimen: Nasal  Mucosa; Nasopharyngeal  Result Value Ref Range Status   MRSA by PCR NEGATIVE NEGATIVE Final    Comment:        The GeneXpert MRSA Assay (FDA approved for NASAL specimens only), is one component of a comprehensive MRSA colonization surveillance program. It is not intended to diagnose MRSA infection nor to guide or monitor treatment for MRSA infections. Performed at [facility], [place] [address], [place], [place] [address]   Resp Panel by RT-PCR (Flu A&B, Covid) Nasopharyngeal Swab     Status: None   Collection Time: 09/08/20 11:35 AM   Specimen: Nasopharyngeal Swab; Nasopharyngeal(NP) swabs in vial transport medium  Result Value Ref Range Status   SARS Coronavirus 2 by RT PCR NEGATIVE NEGATIVE Final    Comment: (NOTE) SARS-CoV-2 target nucleic acids are NOT DETECTED.  The SARS-CoV-2 RNA is generally detectable in upper respiratory specimens during the acute phase of infection. The lowest concentration of SARS-CoV-2 viral copies this assay can detect is 138 copies/mL. A negative result does not preclude SARS-Cov-2 infection and should not be used as the sole basis for treatment or other patient management decisions. A negative result may occur with  improper specimen collection/handling, submission of specimen other than nasopharyngeal swab, presence of viral mutation(s) within the areas targeted by this assay, and inadequate number of viral copies(<138 copies/mL). A negative result must be combined with clinical observations, patient history, and epidemiological information. The expected result is Negative.  Fact Sheet for Patients:  EntrepreneurPulse.com.au  Fact Sheet for Healthcare Providers:  IncredibleEmployment.be  This test is no t yet approved or cleared by the [place] FDA and  has been authorized for detection and/or diagnosis of SARS-CoV-2 by FDA under an Emergency Use Authorization (EUA). This EUA will  remain  in  effect (meaning this test can be used) for the duration of the COVID-19 declaration under Section 564(b)(1) of the Act, 21 U.S.C.section 360bbb-3(b)(1), unless the authorization is terminated  or revoked sooner.       Influenza A by PCR NEGATIVE NEGATIVE Final   Influenza B by PCR NEGATIVE NEGATIVE Final    Comment: (NOTE) The Xpert Xpress SARS-CoV-2/FLU/RSV plus assay is intended as an aid in the diagnosis of influenza from Nasopharyngeal swab specimens and should not be used as a sole basis for treatment. Nasal washings and aspirates are unacceptable for Xpert Xpress SARS-CoV-2/FLU/RSV testing.  Fact Sheet for Patients: EntrepreneurPulse.com.au  Fact Sheet for Healthcare Providers: IncredibleEmployment.be  This test is not yet approved or cleared by the [place] FDA and has been authorized for detection and/or diagnosis of SARS-CoV-2 by FDA under an Emergency Use Authorization (EUA). This EUA will remain in effect (meaning this test can be used) for the duration of the COVID-19 declaration under Section 564(b)(1) of the Act, 21 U.S.C. section 360bbb-3(b)(1), unless the authorization is terminated or revoked.  Performed at [facility], [place] [address] [place], [place] [address]      Time coordinating discharge: 38 minutes  SIGNED:   Georgette Shell, MD  [facility] 09/10/2020, 12:30 PM

## 2020-09-10 NOTE — Care Management Important Message (Signed)
Important Message  Patient Details  IM Letter placed in Patient's room. Name: Michall Noffke MRN: [id number] Date of Birth: 08/17/1945   Medicare Important Message Given:  Yes     Kerin Salen 09/10/2020, 9:16 AM

## 2020-09-14 ENCOUNTER — Encounter: Payer: Self-pay | Admitting: Family Medicine

## 2020-09-14 DIAGNOSIS — F0280 Dementia in other diseases classified elsewhere without behavioral disturbance: Secondary | ICD-10-CM

## 2020-09-14 NOTE — Anesthesia Postprocedure Evaluation (Signed)
Anesthesia Post Note  Patient: Jesus Jordan  Procedure(s) Performed: INTRAMEDULLARY (IM) NAIL FEMORAL (Right )     Patient location during evaluation: PACU Anesthesia Type: General Level of consciousness: awake and alert Pain management: pain level controlled Vital Signs Assessment: post-procedure vital signs reviewed and stable Respiratory status: spontaneous breathing, nonlabored ventilation, respiratory function stable and patient connected to nasal cannula oxygen Cardiovascular status: blood pressure returned to baseline and stable Postop Assessment: no apparent nausea or vomiting Anesthetic complications: no   No complications documented.  Last Vitals:  Vitals:   09/10/20 0900 09/10/20 1344  BP: 90/65 99/63  Pulse: 70 74  Resp:  17  Temp:  36.7 C  SpO2:  98%    Last Pain:  Vitals:   09/10/20 1344  TempSrc: Oral  PainSc:                  [name]

## 2020-09-15 DIAGNOSIS — S72001A Fracture of unspecified part of neck of right femur, initial encounter for closed fracture: Secondary | ICD-10-CM | POA: Diagnosis not present

## 2020-09-17 LAB — VITAMIN B1: Vitamin B1 (Thiamine): 234.2 nmol/L — ABNORMAL HIGH (ref 66.5–200.0)

## 2020-09-18 DIAGNOSIS — R52 Pain, unspecified: Secondary | ICD-10-CM | POA: Diagnosis not present

## 2020-09-18 DIAGNOSIS — Z79899 Other long term (current) drug therapy: Secondary | ICD-10-CM | POA: Diagnosis not present

## 2020-09-18 DIAGNOSIS — S72001A Fracture of unspecified part of neck of right femur, initial encounter for closed fracture: Secondary | ICD-10-CM | POA: Diagnosis not present

## 2020-09-18 DIAGNOSIS — M810 Age-related osteoporosis without current pathological fracture: Secondary | ICD-10-CM | POA: Diagnosis not present

## 2020-09-18 DIAGNOSIS — R5381 Other malaise: Secondary | ICD-10-CM | POA: Diagnosis not present

## 2020-09-23 ENCOUNTER — Telehealth: Payer: Self-pay

## 2020-09-23 NOTE — Telephone Encounter (Signed)
Spoke with patient's daughter [name] and scheduled an in-person Palliative Consult for 10/07/20 @ 12PM  COVID screening was negative. One cat in the home. [name] lives with him.  Consent obtained; updated Outlook/Netsmart/Team List and Epic.  Family is aware they may be receiving a call from NP the day before or day of to confirm appointment.

## 2020-09-24 ENCOUNTER — Telehealth: Payer: Self-pay | Admitting: Pharmacist

## 2020-09-24 NOTE — Chronic Care Management (AMB) (Signed)
Chronic Care Management Pharmacy Assistant   Name: Jesus Jordan  MRN: [id number] DOB: Jan 22, 1946  Reason for Encounter: Medication Review   Recent office visits:  None  Recent consult visits:  None  Hospital visits:  Medication Reconciliation was completed by comparing discharge summary, patient's EMR and Pharmacy list, and upon discussion with patient.  Admitted to the hospital on 09/01/2020 due to Right Hip Fracture. Discharge date was 09/10/2020. Discharged from [facility]?Medications Started at [facility] Discharge:?? -started the following medication due to right hip fracture. . Acetaminophen (TYLENOL) 325 MG tablet every six hours as needed quantity 100 . Tramadol (ULTRAM) 50 MG tablet every six hours as needed quantity 30  Medications that remain the same after Hospital Discharge:??  -All other medications will remain the same.    Medications: Outpatient Encounter Medications as of 09/24/2020  Medication Sig  . acetaminophen (TYLENOL) 325 MG tablet Take 1-2 tablets (325-650 mg total) by mouth every 6 (six) hours as needed for mild pain (pain score 1-3 or temp > 100.5).  Marland Kitchen allopurinol (ZYLOPRIM) 300 MG tablet Take 1 tablet (300 mg total) by mouth daily.  Marland Kitchen aspirin 81 MG chewable tablet Chew 1 tablet (81 mg total) by mouth 2 (two) times daily with a meal.  . donepezil (ARICEPT) 10 MG tablet Take 1 tablet (10 mg total) by mouth at bedtime.  Marland Kitchen doxycycline (VIBRA-TABS) 100 MG tablet Take 100 mg by mouth daily. For rosacea/continuous  . ezetimibe (ZETIA) 10 MG tablet Take 1 tablet by mouth once daily (Patient taking differently: Take 10 mg by mouth at bedtime.)  . folic acid (FOLVITE) 1 MG tablet Take 1 tablet (1 mg total) by mouth daily.  . metoprolol succinate (TOPROL-XL) 25 MG 24 hr tablet Take 1 tablet (25 mg total) by mouth daily.  . mirtazapine (REMERON) 15 MG tablet Take 15 mg by mouth at bedtime.  . Multiple Vitamin (MULTIVITAMIN WITH MINERALS)  TABS tablet Take 1 tablet by mouth daily.  . ondansetron (ZOFRAN) 4 MG tablet Take 1 tablet (4 mg total) by mouth every 6 (six) hours as needed for nausea.  . polyethylene glycol (MIRALAX / GLYCOLAX) 17 g packet Take 17 g by mouth daily as needed for mild constipation.  . QUEtiapine (SEROQUEL) 25 MG tablet Take 1 tablet (25 mg total) by mouth at bedtime as needed (agitaiton and aggression).  Marland Kitchen senna-docusate (SENOKOT-S) 8.6-50 MG tablet Take 1 tablet by mouth 2 (two) times daily.  . simvastatin (ZOCOR) 40 MG tablet TAKE 1 TABLET BY MOUTH AT BEDTIME (Patient taking differently: Take 40 mg by mouth daily at 6 PM.)  . tetrahydrozoline-zinc (VISINE-AC) 0.05-0.25 % ophthalmic solution Place 2 drops into both eyes 3 (three) times daily as needed (for itching ot redness).   . thiamine 100 MG tablet Take 1 tablet (100 mg total) by mouth daily.  . traMADol (ULTRAM) 50 MG tablet Take 1 tablet (50 mg total) by mouth every 6 (six) hours as needed for moderate pain.   No facility-administered encounter medications on file as of 09/24/2020.   Reviewed chart for medication changes ahead of medication coordination call.  BP Readings from Last 3 Encounters:  09/10/20 99/63  09/01/20 120/72  07/14/20 112/60    Lab Results  Component Value Date   HGBA1C  09/13/2008    5.8 (NOTE)   The ADA recommends the following therapeutic goal for glycemic   control related to Hgb A1C measurement:   Goal of Therapy:   <  7.0% Hgb A1C   Reference: American Diabetes Association: Clinical Practice   Recommendations 2008, Diabetes Care,  2008, 31:(Suppl 1).     Patient obtains medications through Adherence Packaging  30 Days  Last adherence delivery included:  . Aspirin 81 mg: one tablet at breakfast . Allopurinol (ZYLOPRIM) 300 mg: one tablet at breakfast . Donepezil (ARICEPT) 10 mg: one tablet ay bedtime . Ezetimibe (ZETIA) 10 mg: one tablet at breakfast . Metoprolol succinate (TOPROL-XL) 25 MG 24 hr: one tablet at  breakfast . Simvastatin (ZOCOR) 40 mg: one tablet at bedtime . Mirtazapine (Remeron) 15 mg: one at bedtime  Patient declined the following medication last month due to PRN use. Marland Kitchen Fluticasone (CUTIVATE) 0.05 % cream . Desoximetasone (TOPICORT) 0.25 % cream  I spoke with the patient and reviewed medications. There are no changes in medications currently. The patient declined these medications this month due to PRN use/additional supply on hand. The patient is taking the following medications: the patient was admitted to the hospital when the last adherence package was delivered. No medication was used and the patient restarted the package on 04.08.2022 . Aspirin 81 mg: one tablet at breakfast . Allopurinol (ZYLOPRIM) 300 mg: one tablet at breakfast . Donepezil (ARICEPT) 10 mg: one tablet ay bedtime . Ezetimibe (ZETIA) 10 mg: one tablet at breakfast . Metoprolol succinate (TOPROL-XL) 25 MG 24 hr: one tablet at breakfast . Simvastatin (ZOCOR) 40 mg: one tablet at bedtime . Mirtazapine (Remeron) 15 mg: one at bedtime . Fluticasone (CUTIVATE) 0.05 % cream . Desoximetasone (TOPICORT) 0.25 % cream  The patient does not currently need refill.  Star Rating Drugs:  Dispensed Quantity Pharmacy  Simvastatin 03.16.2022 30 [facility]    Maia Breslow, [place] [phone]

## 2020-09-25 DIAGNOSIS — S72001A Fracture of unspecified part of neck of right femur, initial encounter for closed fracture: Secondary | ICD-10-CM | POA: Diagnosis not present

## 2020-09-25 DIAGNOSIS — K219 Gastro-esophageal reflux disease without esophagitis: Secondary | ICD-10-CM | POA: Diagnosis not present

## 2020-09-25 DIAGNOSIS — R5381 Other malaise: Secondary | ICD-10-CM | POA: Diagnosis not present

## 2020-09-25 DIAGNOSIS — M810 Age-related osteoporosis without current pathological fracture: Secondary | ICD-10-CM | POA: Diagnosis not present

## 2020-10-02 DIAGNOSIS — S72031D Displaced midcervical fracture of right femur, subsequent encounter for closed fracture with routine healing: Secondary | ICD-10-CM | POA: Diagnosis not present

## 2020-10-07 ENCOUNTER — Telehealth: Payer: Self-pay | Admitting: Nurse Practitioner

## 2020-10-07 ENCOUNTER — Other Ambulatory Visit: Payer: Self-pay

## 2020-10-07 ENCOUNTER — Other Ambulatory Visit: Payer: Medicare Other | Admitting: Nurse Practitioner

## 2020-10-21 ENCOUNTER — Telehealth: Payer: Self-pay | Admitting: Pharmacist

## 2020-10-21 NOTE — Chronic Care Management (AMB) (Signed)
Chronic Care Management Pharmacy Assistant   Name: Jesus Jordan  MRN: [id number] DOB: [birth year]  Reason for Encounter: Medication Review-Medication Coordination Call   Recent office visits:  None  Recent consult visits:  . 04.15.2022 Cherlynn June Physician Assistant-orthopedics patient presents for follow up of fracture of neck of femur  Hospital visits:  None in previous 6 months  Medications: Outpatient Encounter Medications as of 10/21/2020  Medication Sig  . acetaminophen (TYLENOL) 325 MG tablet Take 1-2 tablets (325-650 mg total) by mouth every 6 (six) hours as needed for mild pain (pain score 1-3 or temp > 100.5).  Marland Kitchen allopurinol (ZYLOPRIM) 300 MG tablet Take 1 tablet (300 mg total) by mouth daily.  Marland Kitchen donepezil (ARICEPT) 10 MG tablet Take 1 tablet (10 mg total) by mouth at bedtime.  Marland Kitchen doxycycline (VIBRA-TABS) 100 MG tablet Take 100 mg by mouth daily. For rosacea/continuous  . ezetimibe (ZETIA) 10 MG tablet Take 1 tablet by mouth once daily (Patient taking differently: Take 10 mg by mouth at bedtime.)  . folic acid (FOLVITE) 1 MG tablet Take 1 tablet (1 mg total) by mouth daily.  . metoprolol succinate (TOPROL-XL) 25 MG 24 hr tablet Take 1 tablet (25 mg total) by mouth daily.  . mirtazapine (REMERON) 15 MG tablet Take 15 mg by mouth at bedtime.  . Multiple Vitamin (MULTIVITAMIN WITH MINERALS) TABS tablet Take 1 tablet by mouth daily.  . ondansetron (ZOFRAN) 4 MG tablet Take 1 tablet (4 mg total) by mouth every 6 (six) hours as needed for nausea.  . polyethylene glycol (MIRALAX / GLYCOLAX) 17 g packet Take 17 g by mouth daily as needed for mild constipation.  . QUEtiapine (SEROQUEL) 25 MG tablet Take 1 tablet (25 mg total) by mouth at bedtime as needed (agitaiton and aggression).  Marland Kitchen senna-docusate (SENOKOT-S) 8.6-50 MG tablet Take 1 tablet by mouth 2 (two) times daily.  . simvastatin (ZOCOR) 40 MG tablet TAKE 1 TABLET BY MOUTH AT BEDTIME (Patient taking differently: Take 40 mg  by mouth daily at 6 PM.)  . tetrahydrozoline-zinc (VISINE-AC) 0.05-0.25 % ophthalmic solution Place 2 drops into both eyes 3 (three) times daily as needed (for itching ot redness).   . thiamine 100 MG tablet Take 1 tablet (100 mg total) by mouth daily.  . traMADol (ULTRAM) 50 MG tablet Take 1 tablet (50 mg total) by mouth every 6 (six) hours as needed for moderate pain.   No facility-administered encounter medications on file as of 10/21/2020.    Reviewed chart for medication changes ahead of medication coordination call.  BP Readings from Last 3 Encounters:  09/10/20 99/63  09/01/20 120/72  07/14/20 112/60    Lab Results  Component Value Date   HGBA1C  09/13/2008    5.8 (NOTE)   The ADA recommends the following therapeutic goal for glycemic   control related to Hgb A1C measurement:   Goal of Therapy:   < 7.0% Hgb A1C   Reference: American Diabetes Association: Clinical Practice   Recommendations 2008, Diabetes Care,  2008, 31:(Suppl 1).    Patient obtains medications through Adherence Packaging  30 Days  Last adherence delivery included:  . Aspirin 81 mg: one tablet at breakfast . Allopurinol (ZYLOPRIM) 300 mg: one tablet at breakfast . Donepezil (ARICEPT) 10 mg: one tablet ay bedtime . Ezetimibe (ZETIA) 10 mg: one tablet at breakfast . Metoprolol succinate (TOPROL-XL) 25 MG 24 hr: one tablet at breakfast . Simvastatin (ZOCOR) 40 mg: one tablet at bedtime . Mirtazapine (Remeron) 15  mg: one at bedtime  Patient declined the following last month due to patient was hospitalized for a month and restarted package on 09/25/2020. . Aspirin 81 mg: one tablet at breakfast . Allopurinol (ZYLOPRIM) 300 mg: one tablet at breakfast . Donepezil (ARICEPT) 10 mg: one tablet ay bedtime . Ezetimibe (ZETIA) 10 mg: one tablet at breakfast . Metoprolol succinate (TOPROL-XL) 25 MG 24 hr: one tablet at breakfast . Simvastatin (ZOCOR) 40 mg: one tablet at bedtime . Mirtazapine (Remeron) 15 mg: one at  bedtime . Fluticasone (CUTIVATE) 0.05 % cream . Desoximetasone (TOPICORT) 0.25 % cream  Patient is due for next adherence delivery on: 10/23/2020. Called patient and reviewed medications and coordinated delivery. This delivery to include: . Aspirin 81 mg: one tablet at breakfast . Allopurinol (ZYLOPRIM) 300 mg: one tablet at breakfast . Donepezil (ARICEPT) 10 mg: one tablet ay bedtime . Ezetimibe (ZETIA) 10 mg: one tablet at breakfast . Metoprolol succinate (TOPROL-XL) 25 MG 24 hr: one tablet at breakfast . Simvastatin (ZOCOR) 40 mg: one tablet at bedtime . Mirtazapine (Remeron) 15 mg: one at bedtime  Patient declined the following medications medication due to PRN use. Marland Kitchen Fluticasone (CUTIVATE) 0.05 % cream . Desoximetasone (TOPICORT) 0.25 % cream  Patient needs refills for . Allopurinol (ZYLOPRIM) 300 mg: one tablet at breakfast . Ezetimibe (ZETIA) 10 mg: one tablet at breakfast . Mirtazapine (Remeron) 15 mg: one at bedtime   Confirmed delivery date of 10/23/2020, advised patient that pharmacy will contact them the morning of delivery.  Star Rating Drugs:  Dispensed Quantity Pharmacy  Simvastatin 40 mg 03.16.2022 [facility]   Amilia Revonda Standard, [place] [phone]

## 2020-10-27 ENCOUNTER — Other Ambulatory Visit: Payer: Medicare Other | Admitting: Nurse Practitioner

## 2020-10-27 ENCOUNTER — Other Ambulatory Visit: Payer: Self-pay

## 2020-10-27 DIAGNOSIS — K5900 Constipation, unspecified: Secondary | ICD-10-CM | POA: Diagnosis not present

## 2020-10-27 DIAGNOSIS — Z515 Encounter for palliative care: Secondary | ICD-10-CM

## 2020-10-27 NOTE — Progress Notes (Signed)
[facility] Consult Note Telephone: [phone]  Fax: [phone]   Date of encounter: 10/27/20 PATIENT NAME: Jesus Jordan [address] [place] [address]   [phone] (home)  DOB: 09-06-1945 MRN: [id number]  PRIMARY CARE PROVIDER:    Eulas Post, MD,  [place] [address] [phone]  REFERRING PROVIDER:   Eulas Post, MD [address] [place],  [place] [address] [phone]  RESPONSIBLE PARTY:    Contact Information    Name Relation Home Work Mobile   [place] Daughter [phone]  [phone]     I met face to face with patient and daughter in home.  Palliative Care was asked to follow this patient by consultation request of  Burchette, Alinda Sierras, MD to address advance care planning and complex medical decision making. This is the initial visit.                                  ASSESSMENT AND PLAN / RECOMMENDATIONS:   Advance Care Planning/Goals of Care: Goals include to maximize quality of life and symptom management. Our advance care planning conversation included a discussion about:     The value and importance of advance care planning   Experiences with loved ones who have been seriously ill or have died   Exploration of personal, cultural or spiritual beliefs that might influence medical decisions   Exploration of goals of care in the event of a sudden injury or illness   Identification and preparation of a healthcare agent   Review and updating or creation of an  advance directive document.  CODE STATUS: Full code Goals of Care: Goal of care Is function. Patient's goal is to stay as independent as possible.  Directive: After discussions on ramifications and implications of code status patient elected to be a full Code. Patient reiterated desire for full spectrum of care including mechanical ventilation in the event of cardiac or respiratory arrest. Validation  provided.  It was also explained to patient that code will be regularly reviewed to ensure that it reflects patient wishes. The need to complete a MOST form was discussed, patient verbalized desire to defer completing the form today saying she need time to review the form and discuss it with his children. He also asked for time to research more into palliative Care services and check with his insurance company about coverage for the services. Opportunity for questions was given, all questions were answered. Questions and concerns were addressed. Patient and family was encouraged to call with questions and/or concerns. My business card was provided. A blank MOST form was left with patient to review, we will complete the form when he is ready. Palliative care will continue to provide support to patient, family and the medical team.   I spent 45mnutes providing this consultation. More than 50% of the time in this consultation was spent in counseling and care coordination. ----------------------------------------------------------------------------------------  Symptom Management/Plan: Constipation: Patient not taking prescribed Colace and Miralax. Encouraged to take Colace daily and Miralax if no bowel movement in more than 2 days. Patient verbalized understanding. Pain: Patient report pain related to right hip surgery is controlled on PRN Ibuprofen last taken 2 days ago. Endorsed 1/10 pain today.  Endorsed steady gait, no report of falls. Ambulates without assistive device.   Follow up Palliative Care Visit: Palliative care will continue to follow for complex medical decision making,  advance care planning, and clarification of goals. Return in 2 months or prn.  PPS: 70%  HOSPICE ELIGIBILITY/DIAGNOSIS: TBD  Chief Complaint: constipation  History obtained from review of Epic EMR and discussion with Jesus Jordan and daughter.   HISTORY OF PRESENT ILLNESS:  Jesus Jordan is a 75 y.o. year old male  with  medical problems including HTN, GERD, HLD, dementia with history of alcohol abuse. Patient is s/p right hip intertrochanteric IM fixation on 3/17 secondary to fracture sustained from fall while playing Tennis. Patient had problem with derilium and psychosis during recovery period both in the hospital and during rehabilitation stay in SNF. His daughter Jesus Jordan is his main caregiver and report resolution of symptoms since patient stopped opioid pain medication.  Patient complained of constipation today. Report last bowel movement was today after 3 days, report difficulty with straining. He denied rectal bleeding, denied bloating, denied abdominal pain, denied nausea or vomiting.  Reviewed portable pelvis 1-2 view x-[place]inel IMPRESSION: Interval surgical fixation of nondisplaced right intertrochanteric fracture.  Component Ref Range & Units 1 mo ago  (09/09/20) 1 mo ago  (09/07/20) 1 mo ago  (09/06/20) 1 mo ago  (09/05/20) 1 mo ago  (09/04/20) 1 mo ago  (09/03/20) 1 mo ago  (09/02/20)  Sodium 135 - 145 mmol/L 135  136  138  139  136   137   Potassium 3.5 - 5.1 mmol/L 3.9  3.7  3.9  3.6  4.0   3.9   Chloride 98 - 111 mmol/L 103  102  104  103  103   104   CO2 22 - 32 mmol/L _0 Glucose, Bld 70 - 99 mg/dL 99  103High CM  101High CM  129High CM  135High CM   97 CM   Comment: Glucose reference range applies only to samples taken after fasting for at least 8 hours.  BUN 8 - 23 mg/dL 25High  24High  29High  _1 Creatinine, Ser 0.61 - 1.24 mg/dL 0.96  0.85  0.79  0.85  0.79  0.80  0.81   Calcium 8.9 - 10.3 mg/dL 8.8Low  9.1  9.0  9.3  9.0   9.2   GFR, Estimated >60 mL/min >60  >60 CM  >60 CM  >60 CM  >60 CM  >60 CM  >60 CM   Comment: (NOTE)  Calculated using the CKD-EPI Creatinine Equation (2021)   Anion gap 5 - _2 CM  11 CM  8 CM  9 CM        Component Ref Range & Units 1 mo ago  (09/09/20) 1 mo ago  (09/07/20) 1 mo ago  (09/06/20) 1 mo ago  (09/05/20) 1  mo ago  (09/04/20) 1 mo ago  (09/03/20) 1 mo ago  (09/02/20)  WBC 4.0 - 10.5 K/uL 7.9  6.9  8.6  9.7  11.5High  10.3  8.0   RBC 4.22 - 5.81 MIL/uL 3.33Low  3.74Low  3.62Low  3.97Low  3.93Low  4.30  4.32   Hemoglobin 13.0 - 17.0 g/dL 10.9Low  12.2Low  12.0Low  13.0  12.9Low  14.3  14.4   HCT 39.0 - 52.0 % 33.7Low  37.2Low  36.1Low  39.3  38.5Low  43.0  42.8   MCV 80.0 - 100.0 fL 101.2High  99.5  99.7  99.0  98.0  100.0  99.1   MCH 26.0 -  34.0 pg 32.7  32.6  33.1  32.7  32.8  33.3  33.3   MCHC 30.0 - 36.0 g/dL 32.3  32.8  33.2  33.1  33.5  33.3  33.6   RDW 11.5 - 15.5 % 13.6  13.8  14.1  13.7  13.6  13.9  14.0   Platelets 150 - 400 K/uL 174  144Low  141Low CM  130Low CM  127Low CM  121Low    I reviewed available labs, medications, imaging, studies and related documents from the EMR.  Records reviewed and summarized above.   ROS EYES: denies acute vision changes ENMT: denies dysphagia Cardiovascular: denies chest pain, denies DOE Pulmonary: denies cough, denies increased SOB Abdomen: endorses good appetite, endorsed constipation, endorses continence of bowel GU: denies dysuria, endorses continence of urine MSK:  denies weakness,  no falls reported Skin: denies rashes or wounds Neurological: denies pain, denies insomnia Psych: Endorses positive mood Heme/lymph/immuno: denies bruises, abnormal bleeding  Physical Exam: General: frail appearing, cooperative, sitting in chair in NAD EYES: anicteric sclera, no discharge  ENMT: hearing aids in, oral mucous membranes moist CV: S1S2 normal, no LE edema Pulmonary: LCTA, no increased work of breathing, no cough, room air Abdomen: no ascites GU: deferred MSK: no sarcopenia, moves all extremities, ambulatory Skin: warm and dry, no rashes or wounds on visible skin Neuro:  generalized weakness,  no cognitive impairment Psych: non-anxious affect, A and O x 4 Hem/lymph/immuno: no widespread  bruising  CURRENT PROBLEM LIST:  Patient Active Problem List   Diagnosis Date Noted  . Hip fracture ([facility]) 09/03/2020  . Closed right hip fracture ([facility]) 09/02/2020  . Major neurocognitive disorder due to multiple etiologies without behavioral disturbance 05/29/2020  . Alcohol abuse, daily use   . Hearing loss   . COPD (chronic obstructive pulmonary disease) 12/10/2018  . Essential hypertension 12/10/2018  . Chest pain 12/09/2018  . GERD (gastroesophageal reflux disease) 04/18/2018  . CAD in native artery 05/03/2016  . Hyperlipidemia with target LDL less than 70 03/06/2013  . Inguinal hernia 05/25/2009  . Gout 01/29/2009  . Coronary atherosclerosis 01/29/2009  . History of MI (myocardial infarction) 09/12/2008   PAST MEDICAL HISTORY:  Active Ambulatory Problems    Diagnosis Date Noted  . Gout 01/29/2009  . Coronary atherosclerosis 01/29/2009  . Inguinal hernia 05/25/2009  . Hyperlipidemia with target LDL less than 70 03/06/2013  . CAD in native artery 05/03/2016  . GERD (gastroesophageal reflux disease) 04/18/2018  . Chest pain 12/09/2018  . COPD (chronic obstructive pulmonary disease) 12/10/2018  . Essential hypertension 12/10/2018  . History of MI (myocardial infarction) 09/12/2008  . Alcohol abuse, daily use   . Hearing loss   . Major neurocognitive disorder due to multiple etiologies without behavioral disturbance 05/29/2020  . Closed right hip fracture ([facility]) 09/02/2020  . Hip fracture ([facility]) 09/03/2020   Resolved Ambulatory Problems    Diagnosis Date Noted  . No Resolved Ambulatory Problems   Past Medical History:  Diagnosis Date  . History of nuclear stress test 02/23/2012   SOCIAL HX:  Social History   Tobacco Use  . Smoking status: Current Some Day Smoker    Years: 30.00    Types: Cigarettes  . Smokeless tobacco: Never Used  . Tobacco comment: skip days some; pack last 3 days   Substance Use Topics  . Alcohol use: Yes    Alcohol/week: 42.0 standard  drinks    Types: 42 Cans of beer per week    Comment: 6 pack  per day   FAMILY HX:  Family History  Problem Relation Age of Onset  . Colon cancer Father   . Alcoholism Father   . Hyperlipidemia Brother   . Heart disease Brother 94       massive MI  . Raynaud syndrome Brother   . Cancer Paternal Grandfather        colon  . Dementia Sister        Unspecified type     ALLERGIES:  Allergies  Allergen Reactions  . Imdur [Isosorbide Nitrate] Other (See Comments)    Caused vertigo  . Lisinopril Other (See Comments)    Possible vertigo??     PERTINENT MEDICATIONS:  Doxycyline 133m daily for chronic rocesae Donepezil 158mdaily Remeron 1583maily Simvastatin 22m33m bed time Allupuril 300mg98mly Aspirin 81mg 49my Ezetimibe 10mg d66m Toprol-XL 25mg   85mk you for the opportunity to participate in the care of Mr. Landen. ThePewlliative care team will continue to follow. Please call our office at [phone] be of additional assistance.   QueenethJari FavreGPCNP-BC  COVID-19 PATIENT SCREENING TOOL Asked and negative response unless otherwise noted:   Have you had symptoms of covid, tested positive or been in contact with someone with symptoms/positive test in the past 5-10 days?

## 2020-10-30 DIAGNOSIS — S72031D Displaced midcervical fracture of right femur, subsequent encounter for closed fracture with routine healing: Secondary | ICD-10-CM | POA: Diagnosis not present

## 2020-11-24 ENCOUNTER — Telehealth: Payer: Self-pay | Admitting: Pharmacist

## 2020-11-24 NOTE — Chronic Care Management (AMB) (Signed)
Chronic Care Management Pharmacy Assistant   Name: Jesus Jordan  MRN: [id number] DOB: 01-17-1946  Reason for Encounter: Disease State   Conditions to be addressed/monitored: HTN  Recent office visits:  None  Recent consult visits:  . 05.10.2022 palliative care home visit  Hospital visits:  None in previous 6 months  Medications: Outpatient Encounter Medications as of 11/24/2020  Medication Sig  . acetaminophen (TYLENOL) 325 MG tablet Take 1-2 tablets (325-650 mg total) by mouth every 6 (six) hours as needed for mild pain (pain score 1-3 or temp > 100.5).  Marland Kitchen allopurinol (ZYLOPRIM) 300 MG tablet Take 1 tablet (300 mg total) by mouth daily.  Marland Kitchen donepezil (ARICEPT) 10 MG tablet Take 1 tablet (10 mg total) by mouth at bedtime.  Marland Kitchen doxycycline (VIBRA-TABS) 100 MG tablet Take 100 mg by mouth daily. For rosacea/continuous  . ezetimibe (ZETIA) 10 MG tablet Take 1 tablet by mouth once daily (Patient taking differently: Take 10 mg by mouth at bedtime.)  . folic acid (FOLVITE) 1 MG tablet Take 1 tablet (1 mg total) by mouth daily.  . metoprolol succinate (TOPROL-XL) 25 MG 24 hr tablet Take 1 tablet (25 mg total) by mouth daily.  . mirtazapine (REMERON) 15 MG tablet Take 15 mg by mouth at bedtime.  . Multiple Vitamin (MULTIVITAMIN WITH MINERALS) TABS tablet Take 1 tablet by mouth daily.  . ondansetron (ZOFRAN) 4 MG tablet Take 1 tablet (4 mg total) by mouth every 6 (six) hours as needed for nausea.  . polyethylene glycol (MIRALAX / GLYCOLAX) 17 g packet Take 17 g by mouth daily as needed for mild constipation.  . QUEtiapine (SEROQUEL) 25 MG tablet Take 1 tablet (25 mg total) by mouth at bedtime as needed (agitaiton and aggression).  Marland Kitchen senna-docusate (SENOKOT-S) 8.6-50 MG tablet Take 1 tablet by mouth 2 (two) times daily.  . simvastatin (ZOCOR) 40 MG tablet TAKE 1 TABLET BY MOUTH AT BEDTIME (Patient taking differently: Take 40 mg by mouth daily at 6 PM.)  . tetrahydrozoline-zinc (VISINE-AC)  0.05-0.25 % ophthalmic solution Place 2 drops into both eyes 3 (three) times daily as needed (for itching ot redness).   . thiamine 100 MG tablet Take 1 tablet (100 mg total) by mouth daily.  . traMADol (ULTRAM) 50 MG tablet Take 1 tablet (50 mg total) by mouth every 6 (six) hours as needed for moderate pain.   No facility-administered encounter medications on file as of 11/24/2020.   Reviewed chart prior to disease state call. Spoke with patient regarding BP  Recent Office Vitals: BP Readings from Last 3 Encounters:  09/10/20 99/63  09/01/20 120/72  07/14/20 112/60   Pulse Readings from Last 3 Encounters:  09/10/20 74  09/01/20 67  07/14/20 68    Wt Readings from Last 3 Encounters:  09/02/20 175 lb 0.7 oz (79.4 kg)  09/01/20 146 lb 9 oz (66.5 kg)  07/14/20 144 lb (65.3 kg)     Kidney Function Lab Results  Component Value Date/Time   CREATININE 0.96 09/09/2020 05:33 AM   CREATININE 0.85 09/07/2020 07:22 AM   CREATININE 0.90 02/10/2020 11:48 AM   CREATININE 1.07 03/18/2014 10:51 AM   GFR 95.90 07/10/2017 03:45 PM   GFRNONAA >60 09/09/2020 05:33 AM   GFRAA >60 08/28/2019 03:54 PM    BMP Latest Ref Rng & Units 09/09/2020 09/07/2020 09/06/2020  Glucose 70 - 99 mg/dL 99 103(H) 101(H)  BUN 8 - 23 mg/dL 25(H) 24(H) 29(H)  Creatinine 0.61 - 1.24 mg/dL 0.96 0.85 0.79  BUN/Creat Ratio 6 - 22 (calc) - - -  Sodium 135 - 145 mmol/L 135 136 138  Potassium 3.5 - 5.1 mmol/L 3.9 3.7 3.9  Chloride 98 - 111 mmol/L 103 102 104  CO2 22 - 32 mmol/L 26 26 23   Calcium 8.9 - 10.3 mg/dL 8.8(L) 9.1 9.0    . Current antihypertensive regimen:  o Metoprolol succinate 25 mg daily . How often are you checking your Blood Pressure? 3-5x per week . Current home BP readings:  o 06.01 108/60  o 06.03 110/62 o 06.05 110/60 o 06.07 106/64 . What recent interventions/DTPs have been made by any provider to improve Blood Pressure control since last CPP Visit: None . Any recent hospitalizations or ED  visits since last visit with CPP? No . What diet changes have been made to improve Blood Pressure Control?  o No Change . What exercise is being done to improve your Blood Pressure Control?  o No Change  Adherence Review: Is the patient currently on ACE/ARB medication? No Does the patient have >5 day gap between last estimated fill dates? No  I spoke with the patient's daughter Jesus Jordan) about medication and adherence. She states that he has been doing well. He has finished physical therapy on his hip since coming out of the hospital. She states that she takes his blood pressure every other day. There have been no recent changes to his medications. He is taking his medication as prescribed. There have been no hospital visits or emergency department visits since his last CPP or PCP visit. There have been no changes in his diet or his exercise routine. The patient has not seen his PCP since he has been discharged from the hospital in March and an appointment was made for the end of June. He also was scheduled for a CCM appointment in July.  Star Rating Drugs: Medication Dispensed  Quantity Pharmacy  Simvastatin 40 mg 06.06.2022 [facility]     Yeng Perz Revonda Standard, [place] Pharmacist Assistant [phone]

## 2020-12-07 ENCOUNTER — Ambulatory Visit (INDEPENDENT_AMBULATORY_CARE_PROVIDER_SITE_OTHER): Payer: Medicare Other | Admitting: Neurology

## 2020-12-07 ENCOUNTER — Encounter: Payer: Self-pay | Admitting: Neurology

## 2020-12-07 ENCOUNTER — Other Ambulatory Visit: Payer: Self-pay

## 2020-12-07 VITALS — BP 107/69 | HR 67 | Ht 70.0 in | Wt 142.0 lb

## 2020-12-07 DIAGNOSIS — G301 Alzheimer's disease with late onset: Secondary | ICD-10-CM | POA: Diagnosis not present

## 2020-12-07 DIAGNOSIS — I2510 Atherosclerotic heart disease of native coronary artery without angina pectoris: Secondary | ICD-10-CM | POA: Diagnosis not present

## 2020-12-07 DIAGNOSIS — F02818 Dementia in other diseases classified elsewhere, unspecified severity, with other behavioral disturbance: Secondary | ICD-10-CM

## 2020-12-07 DIAGNOSIS — F0281 Dementia in other diseases classified elsewhere with behavioral disturbance: Secondary | ICD-10-CM

## 2020-12-07 NOTE — Progress Notes (Signed)
NEUROLOGY CONSULTATION NOTE  Jesus Jordan MRN: [id number] DOB: 07/08/1945  Referring provider: Dr. [name] Primary care provider: Dr. [name]  Reason for consult:  memory loss  Dear Dr [name]:  Thank you for your kind referral of Jesus Jordan for consultation of the above symptoms. Although his history is well known to you, please allow me to reiterate it for the purpose of our medical record. The patient was accompanied to the clinic by his daughter [name] who also provides collateral information. Records and images were personally reviewed where available.   HISTORY OF PRESENT ILLNESS: This is a pleasant 75 year old right-handed man with a history of hypertension, hyperlipidemia, CAD, COPD, alcohol abuse, presenting for evaluation of dementia. He underwent Neuropsychological evaluation in 05/2020 indicating Major Neurocognitive Disorder (ie dementia). Etiology difficult to determine, largely due to fairly diffuse nature of cognitive impairment. Alzheimer's disease is the most common form of neurodegenerative illness, and his profile does share many characteristics of this pattern. Lewy body dementia should remain on the differential, due to frontal subcortical dysfunction with pronounced visuospatial deficits, reports of potential visual hallucinations, and REM sleep behaviors. He also has a large number of cardiovascular ailments in his history with likely vascular contributions, making a "mixed dementia" presentation very likely. I personally reviewed MRI brain without contrast done 06/2020 which did not show any chronic microvascular disease. There was mild to moderate diffuse atrophy, no acute changes. He has been on Donepezil 10mg  qhs.   He feels his memory is "kind of screwed up a bit." [name] had been living in [place] and started noticing changes at least a year ago, however COVID had made it hard to see him more often initially. When she visited, she noted a lot of  confusion, standing and staring. He was drinking a 6-pack beer daily. She took over finances in November 2021 as she noticed bills were getting overwhelming, he was losing them/putting them down somewhere he could not find. When [name] visited, she discovered he was not taking his regular medications. He continues to drive short distances in the daytime and denies getting lost, [name] denies any issues with local driving. She moved in to stay with him in February 2022 and has been managing medications.She noted visual hallucinations in August 2021, thinking people were in the house or having a conversation with someone. This has quieted down, he still has both delusions that someone/family was there, occurring around once a week. No paranoia. When he takes his medications consistently and on a routine, he does much better. He is independent with dressing and bathing. He is able to make his breakfast and has not left the stove on, no difficulties using the microwave. Sleep is overall good, sometimes he yells in his sleep or she sees him acting out his dreams. He sometimes wakes up almost in a cold sweat thinking he needs to do something due to vivid dreams. He is on mirtazapine 15mg  qhs, [name] notes that things are worse when he does not take it at night. There is a family history of dementia in his maternal grandparents and 2 sisters. No history of significant head injuries. They report he has definitely cut down on beer intake to one at night, alternating with coke.  He denies any headaches, dizziness, vision changes, neck/back pain, bladder dysfunction, anosmia, or tremors. He has some constipation. He has numbness in the right hand in a median nerve distribution. He had a fall while playing tennis in March, sustaining a hip  fracture s/p fixation. He has been recovering well and ambulates without assistance. No further falls.    Laboratory Data: Lab Results  Component Value Date   TSH 0.402 09/04/2020    Lab  Results  Component Value Date   XAJOINOM76 720 09/04/2020     PAST MEDICAL HISTORY: Past Medical History:  Diagnosis Date   Alcohol abuse, daily use    05/29/20 - Reported consuming a 6 pack per day   CAD in native artery 05/03/2016   COPD (chronic obstructive pulmonary disease) 12/10/2018   Coronary atherosclerosis 01/29/2009   Essential hypertension 12/10/2018   GERD (gastroesophageal reflux disease) 04/18/2018   Gout    Hearing loss    History of MI (myocardial infarction) 09/12/2008   large inferolateral MI secondary to total occlusion of circumflex with VF cardiac arrest   History of nuclear stress test 02/23/2012   exercise myoview; area of scar in inferolateral wall at mid-basal region   Hyperlipidemia 03/06/2013   target LDL less than 70   Inguinal hernia 05/25/2009   Major neurocognitive disorder due to multiple etiologies without behavioral disturbance 05/29/2020    PAST SURGICAL HISTORY: Past Surgical History:  Procedure Laterality Date   CORONARY STENT PLACEMENT  09/12/2008   r/t MI; 3.5x74mm Xience DES to circumflex & PTCA at multiple sites in distal region of vessel; alos diffusely diseaase, narrowed, nondominant RCA   FEMUR IM NAIL Right 09/03/2020   Procedure: INTRAMEDULLARY (IM) NAIL FEMORAL;  Surgeon: Rod Can, MD;  Location: [facility];  Service: Orthopedics;  Laterality: Right;   FOOT SURGERY  1960   HERNIA REPAIR  2011   left inguinal hernia   TRANSTHORACIC ECHOCARDIOGRAM  03/02/2009   NO=70-96%; LV systolic function borderline reduced with mild inferior wall hypokinesis; mild-mod MR; mild TR; AV mildly sclerotic; mild aortic root dilatation    MEDICATIONS: Current Outpatient Medications on File Prior to Visit  Medication Sig Dispense Refill   acetaminophen (TYLENOL) 325 MG tablet Take 1-2 tablets (325-650 mg total) by mouth every 6 (six) hours as needed for mild pain (pain score 1-3 or temp > 100.5). 100 tablet 0   allopurinol (ZYLOPRIM) 300 MG  tablet Take 1 tablet (300 mg total) by mouth daily. 90 tablet 0   donepezil (ARICEPT) 10 MG tablet Take 1 tablet (10 mg total) by mouth at bedtime. 90 tablet 3   doxycycline (VIBRA-TABS) 100 MG tablet Take 100 mg by mouth daily. For rosacea/continuous     ezetimibe (ZETIA) 10 MG tablet Take 1 tablet by mouth once daily (Patient taking differently: Take 10 mg by mouth at bedtime.) 90 tablet 3   folic acid (FOLVITE) 1 MG tablet Take 1 tablet (1 mg total) by mouth daily.     metoprolol succinate (TOPROL-XL) 25 MG 24 hr tablet Take 1 tablet (25 mg total) by mouth daily. 90 tablet 3   mirtazapine (REMERON) 15 MG tablet Take 15 mg by mouth at bedtime.     Multiple Vitamin (MULTIVITAMIN WITH MINERALS) TABS tablet Take 1 tablet by mouth daily.     ondansetron (ZOFRAN) 4 MG tablet Take 1 tablet (4 mg total) by mouth every 6 (six) hours as needed for nausea. 20 tablet 0   polyethylene glycol (MIRALAX / GLYCOLAX) 17 g packet Take 17 g by mouth daily as needed for mild constipation. 14 each 0   QUEtiapine (SEROQUEL) 25 MG tablet Take 1 tablet (25 mg total) by mouth at bedtime as needed (agitaiton and aggression).     senna-docusate (SENOKOT-S) 8.6-50  MG tablet Take 1 tablet by mouth 2 (two) times daily.     simvastatin (ZOCOR) 40 MG tablet TAKE 1 TABLET BY MOUTH AT BEDTIME (Patient taking differently: Take 40 mg by mouth daily at 6 PM.) 90 tablet 3   tetrahydrozoline-zinc (VISINE-AC) 0.05-0.25 % ophthalmic solution Place 2 drops into both eyes 3 (three) times daily as needed (for itching ot redness).      thiamine 100 MG tablet Take 1 tablet (100 mg total) by mouth daily.     traMADol (ULTRAM) 50 MG tablet Take 1 tablet (50 mg total) by mouth every 6 (six) hours as needed for moderate pain. 30 tablet 0   No current facility-administered medications on file prior to visit.    ALLERGIES: Allergies  Allergen Reactions   Imdur [Isosorbide Nitrate] Other (See Comments)    Caused vertigo   Lisinopril Other  (See Comments)    Possible vertigo??    FAMILY HISTORY: Family History  Problem Relation Age of Onset   Colon cancer Father    Alcoholism Father    Hyperlipidemia Brother    Heart disease Brother 65       massive MI   Raynaud syndrome Brother    Cancer Paternal Grandfather        colon   Dementia Sister        Unspecified type    SOCIAL HISTORY: Social History   Socioeconomic History   Marital status: Single    Spouse name: Not on file   Number of children: 3   Years of education: 12   Highest education level: High school graduate  Occupational History   Occupation: Retired    Comment: [employer]  Tobacco Use   Smoking status: Some Days    Years: 30.00    Pack years: 0.00    Types: Cigarettes   Smokeless tobacco: Never   Tobacco comments:    skip days some; pack last 3 days   Vaping Use   Vaping Use: Never used  Substance and Sexual Activity   Alcohol use: Yes    Alcohol/week: 42.0 standard drinks    Types: 42 Cans of beer per week    Comment: 6 pack per day   Drug use: No   Sexual activity: Not on file  Other Topics Concern   Not on file  Social History Narrative   Not on file   Social Determinants of Health   Financial Resource Strain: Low Risk    Difficulty of Paying Living Expenses: Not very hard  Food Insecurity: Not on file  Transportation Needs: No Transportation Needs   Lack of Transportation (Medical): No   Lack of Transportation (Non-Medical): No  Physical Activity: Not on file  Stress: Not on file  Social Connections: Not on file  Intimate Partner Violence: Not on file     PHYSICAL EXAM: Vitals:   12/07/20 1020  BP: 107/69  Pulse: 67  SpO2: 97%   General: No acute distress Head:  Normocephalic/atraumatic Skin/Extremities: No rash, no edema Neurological Exam: Mental status: alert and awake, no dysarthria or aphasia, Fund of knowledge is appropriate.  Recent and remote memory are impaired. Attention and concentration are  normal.   Cranial nerves: CN I: not tested CN II: pupils equal, round and reactive to light, visual fields intact CN III, IV, VI:  full range of motion, no nystagmus, no ptosis CN V: facial sensation intact CN VII: upper and lower face symmetric CN VIII: hearing intact to conversation CN IX, X: gag  intact, uvula midline CN XI: sternocleidomastoid and trapezius muscles intact CN XII: tongue midline Bulk & Tone: normal, no fasciculations, no cogwheeling Motor: 5/5 throughout with no pronator drift. Sensation: decreased cold and pin sensation on the right 1st to 4th digits, otherwise intact to light touch, cold, pin, vibration sense on both LE. Deep Tendon Reflexes: +2 throughout Cerebellar: no incoordination on finger to nose testing Gait: narrow-based and steady, no ataxia Tremor: none Good finger taps   IMPRESSION: This is a pleasant 75 year old right-handed man with a history of hypertension, hyperlipidemia, CAD, COPD, alcohol abuse, presenting for evaluation of dementia. Neuropsychological evaluation in 05/2020 indicating Major Neurocognitive Disorder (ie dementia). Etiology difficult to determine, largely due to fairly diffuse nature of cognitive impairment. Alzheimer's disease is the most common form of neurodegenerative illness, and his profile does share many characteristics of this pattern. He does not have any parkinsonian signs on exam today, visual hallucinations are not a prominent component, no clear indication otherwise of Lewy body dementia. MRI brain did not show any chronic microvascular disease to indicate vascular component. Brain MRI showed mild to moderate diffuse atrophy, no acute changes. We discussed dementia likely due to Alzheimer's disease, discussed prognosis, management. He will try taking Donepezil 10mg  in the morning to see if this helps with vivid dreams. We also discussed how alcohol can affect cognition, alcohol cessation encouraged, continue thiamine and folic  acid. We discussed the importance of control of vascular risk factors, physical and brain stimulation exercises, MIND diet for overall brain health. Continue to monitor driving, recommend driving evaluation as difficulties arise. Continue close supervision. Follow-up with our Memory disorders PA [name] in 6 months, they know to call for any changes.     Thank you for allowing me to participate in the care of this patient. Please do not hesitate to call for any questions or concerns.   Ellouise Newer, M.D.  CC: Dr. [name]

## 2020-12-07 NOTE — Patient Instructions (Signed)
Good to meet you!  Try taking the Aricept 10mg  every morning and see if this helps with the vivid dreams  2. Restart Thiamine 100mg  daily, continue daily folic acid, mirtazapine  3. Continue to monitor driving  4. Follow-up in 6 months, call for any changes   FALL PRECAUTIONS: Be cautious when walking. Scan the area for obstacles that may increase the risk of trips and falls. When getting up in the mornings, sit up at the edge of the bed for a few minutes before getting out of bed. Consider elevating the bed at the head end to avoid drop of blood pressure when getting up. Walk always in a well-lit room (use night lights in the walls). Avoid area rugs or power cords from appliances in the middle of the walkways. Use a walker or a cane if necessary and consider physical therapy for balance exercise. Get your eyesight checked regularly.   HOME SAFETY: Consider the safety of the kitchen when operating appliances like stoves, microwave oven, and blender. Consider having supervision and share cooking responsibilities until no longer able to participate in those. Accidents with firearms and other hazards in the house should be identified and addressed as well.  DRIVING: Regarding driving, in patients with progressive memory problems, driving will be impaired. We advise to have someone else do the driving if trouble finding directions or if minor accidents are reported. Independent driving assessment is available to determine safety of driving.  ABILITY TO BE LEFT ALONE: If patient is unable to contact 911 operator, consider using LifeLine, or when the need is there, arrange for someone to stay with patients. Smoking is a fire hazard, consider supervision or cessation. Risk of wandering should be assessed by caregiver and if detected at any point, supervision and safe proof recommendations should be instituted.   RECOMMENDATIONS FOR ALL PATIENTS WITH MEMORY PROBLEMS: 1. Continue to exercise (Recommend  30 minutes of walking everyday, or 3 hours every week) 2. Increase social interactions - continue going to [place] and enjoy social gatherings with friends and family 3. Eat healthy, avoid fried foods and eat more fruits and vegetables 4. Maintain adequate blood pressure, blood sugar, and blood cholesterol level. Reducing the risk of stroke and cardiovascular disease also helps promoting better memory. 5. Avoid stressful situations. Live a simple life and avoid aggravations. Organize your time and prepare for the next day in anticipation. 6. Sleep well, avoid any interruptions of sleep and avoid any distractions in the bedroom that may interfere with adequate sleep quality 7. Avoid sugar, avoid sweets as there is a strong link between excessive sugar intake, diabetes, and cognitive impairment We discussed the Mediterranean diet, which has been shown to help patients reduce the risk of progressive memory disorders and reduces cardiovascular risk. This includes eating fish, eat fruits and green leafy vegetables, nuts like almonds and hazelnuts, walnuts, and also use olive oil. Avoid fast foods and fried foods as much as possible. Avoid sweets and sugar as sugar use has been linked to worsening of memory function.  There is always a concern of gradual progression of memory problems. If this is the case, then we may need to adjust level of care according to patient needs. Support, both to the patient and caregiver, should then be put into place.       Mediterranean Diet  Why follow it? Research shows. Those who follow the Mediterranean diet have a reduced risk of heart disease  The diet is associated with a reduced incidence of  Parkinson's and Alzheimer's diseases People following the diet may have longer life expectancies and lower rates of chronic diseases  The Dietary Guidelines for Americans recommends the Mediterranean diet as an eating plan to promote health and prevent disease  What Is the  Mediterranean Diet?  Healthy eating plan based on typical foods and recipes of Mediterranean-style cooking The diet is primarily a plant based diet; these foods should make up a majority of meals   Starches - Plant based foods should make up a majority of meals - They are an important sources of vitamins, minerals, energy, antioxidants, and fiber - Choose whole grains, foods high in fiber and minimally processed items  - Typical grain sources include wheat, oats, barley, corn, brown rice, bulgar, farro, millet, polenta, couscous  - Various types of beans include chickpeas, lentils, fava beans, black beans, white beans   Fruits  Veggies - Large quantities of antioxidant rich fruits & veggies; 6 or more servings  - Vegetables can be eaten raw or lightly drizzled with oil and cooked  - Vegetables common to the traditional Mediterranean Diet include: artichokes, arugula, beets, broccoli, brussel sprouts, cabbage, carrots, celery, collard greens, cucumbers, eggplant, kale, leeks, lemons, lettuce, mushrooms, okra, onions, peas, peppers, potatoes, pumpkin, radishes, rutabaga, shallots, spinach, sweet potatoes, turnips, zucchini - Fruits common to the Mediterranean Diet include: apples, apricots, avocados, cherries, clementines, dates, figs, grapefruits, grapes, melons, nectarines, oranges, peaches, pears, pomegranates, strawberries, tangerines  Fats - Replace butter and margarine with healthy oils, such as olive oil, canola oil, and tahini  - Limit nuts to no more than a handful a day  - Nuts include walnuts, almonds, pecans, pistachios, pine nuts  - Limit or avoid candied, honey roasted or heavily salted nuts - Olives are central to the [employer] - can be eaten whole or used in a variety of dishes   Meats Protein - Limiting red meat: no more than a few times a month - When eating red meat: choose lean cuts and keep the portion to the size of deck of cards - Eggs: approx. 0 to 4 times a week   - Fish and lean poultry: at least 2 a week  - Healthy protein sources include, chicken, [place], lean beef, lamb - Increase intake of seafood such as tuna, salmon, trout, mackerel, shrimp, scallops - Avoid or limit high fat processed meats such as sausage and bacon  Dairy - Include moderate amounts of low fat dairy products  - Focus on healthy dairy such as fat free yogurt, skim milk, low or reduced fat cheese - Limit dairy products higher in fat such as whole or 2% milk, cheese, ice cream  Alcohol - Moderate amounts of red wine is ok  - No more than 5 oz daily for women (all ages) and men older than age 55  - No more than 10 oz of wine daily for men younger than 83  Other - Limit sweets and other desserts  - Use herbs and spices instead of salt to flavor foods  - Herbs and spices common to the traditional Mediterranean Diet include: basil, bay leaves, chives, cloves, cumin, fennel, garlic, lavender, marjoram, mint, oregano, parsley, pepper, rosemary, sage, savory, sumac, tarragon, thyme   It's not just a diet, it's a lifestyle:  The Mediterranean diet includes lifestyle factors typical of those in the region  Foods, drinks and meals are best eaten with others and savored Daily physical activity is important for overall good health This could be strenuous exercise  like running and aerobics This could also be more leisurely activities such as walking, housework, yard-work, or taking the stairs Moderation is the key; a balanced and healthy diet accommodates most foods and drinks Consider portion sizes and frequency of consumption of certain foods   Meal Ideas & Options:  Breakfast:  Whole wheat toast or whole wheat English muffins with peanut butter & hard boiled egg Steel cut oats topped with apples & cinnamon and skim milk  Fresh fruit: banana, strawberries, melon, berries, peaches  Smoothies: strawberries, bananas, greek yogurt, peanut butter Low fat greek yogurt with blueberries and  granola  Egg white omelet with spinach and mushrooms Breakfast couscous: whole wheat couscous, apricots, skim milk, cranberries  Sandwiches:  Hummus and grilled vegetables (peppers, zucchini, squash) on whole wheat bread   Grilled chicken on whole wheat pita with lettuce, tomatoes, cucumbers or tzatziki  Jordan salad on whole wheat bread: tuna salad made with greek yogurt, olives, red peppers, capers, green onions Garlic rosemary lamb pita: lamb sauted with garlic, rosemary, salt & pepper; add lettuce, cucumber, greek yogurt to pita - flavor with lemon juice and black pepper  Seafood:  Mediterranean grilled salmon, seasoned with garlic, basil, parsley, lemon juice and black pepper Shrimp, lemon, and spinach whole-grain pasta salad made with low fat greek yogurt  Seared scallops with lemon orzo  Seared tuna steaks seasoned salt, pepper, coriander topped with tomato mixture of olives, tomatoes, olive oil, minced garlic, parsley, green onions and cappers  Meats:  Herbed greek chicken salad with kalamata olives, cucumber, feta  Red bell peppers stuffed with spinach, bulgur, lean ground beef (or lentils) & topped with feta   Kebabs: skewers of chicken, tomatoes, onions, zucchini, squash  [place] burgers: made with red onions, mint, dill, lemon juice, feta cheese topped with roasted red peppers Vegetarian Cucumber salad: cucumbers, artichoke hearts, celery, red onion, feta cheese, tossed in olive oil & lemon juice  Hummus and whole grain pita points with a greek salad (lettuce, tomato, feta, olives, cucumbers, red onion) Lentil soup with celery, carrots made with vegetable broth, garlic, salt and pepper  Tabouli salad: parsley, bulgur, mint, scallions, cucumbers, tomato, radishes, lemon juice, olive oil, salt and pepper.

## 2020-12-10 ENCOUNTER — Telehealth: Payer: Self-pay | Admitting: Pharmacist

## 2020-12-10 NOTE — Chronic Care Management (AMB) (Signed)
Chronic Care Management Pharmacy Assistant   Name: Jesus Jordan  MRN: [id number] DOB: 13-Jul-1945  Reason for Encounter: Medication Review   Recent office visits: None  Recent consult visits:  06.20.2022 Delice Lesch, Lezlie Octave, MD Neurology patient seen for follow-up visit. Aricept 10 mg moved to daily at breakfast  Hospital visits:  None in previous 6 months  Medications: Outpatient Encounter Medications as of 12/10/2020  Medication Sig   acetaminophen (TYLENOL) 325 MG tablet Take 1-2 tablets (325-650 mg total) by mouth every 6 (six) hours as needed for mild pain (pain score 1-3 or temp > 100.5).   allopurinol (ZYLOPRIM) 300 MG tablet Take 1 tablet (300 mg total) by mouth daily.   donepezil (ARICEPT) 10 MG tablet Take 1 tablet (10 mg total) by mouth at bedtime.   doxycycline (VIBRA-TABS) 100 MG tablet Take 100 mg by mouth daily. For rosacea/continuous (Patient not taking: Reported on 12/07/2020)   ezetimibe (ZETIA) 10 MG tablet Take 1 tablet by mouth once daily (Patient not taking: Reported on 09/26/8117)   folic acid (FOLVITE) 1 MG tablet Take 1 tablet (1 mg total) by mouth daily.   metoprolol succinate (TOPROL-XL) 25 MG 24 hr tablet Take 1 tablet (25 mg total) by mouth daily.   mirtazapine (REMERON) 15 MG tablet Take 15 mg by mouth at bedtime.   Multiple Vitamin (MULTIVITAMIN WITH MINERALS) TABS tablet Take 1 tablet by mouth daily.   ondansetron (ZOFRAN) 4 MG tablet Take 1 tablet (4 mg total) by mouth every 6 (six) hours as needed for nausea.   polyethylene glycol (MIRALAX / GLYCOLAX) 17 g packet Take 17 g by mouth daily as needed for mild constipation.   QUEtiapine (SEROQUEL) 25 MG tablet Take 1 tablet (25 mg total) by mouth at bedtime as needed (agitaiton and aggression). (Patient not taking: Reported on 12/07/2020)   senna-docusate (SENOKOT-S) 8.6-50 MG tablet Take 1 tablet by mouth 2 (two) times daily.   simvastatin (ZOCOR) 40 MG tablet TAKE 1 TABLET BY MOUTH AT BEDTIME (Patient taking  differently: Take 40 mg by mouth daily at 6 PM.)   tetrahydrozoline-zinc (VISINE-AC) 0.05-0.25 % ophthalmic solution Place 2 drops into both eyes 3 (three) times daily as needed (for itching ot redness).    thiamine 100 MG tablet Take 1 tablet (100 mg total) by mouth daily. (Patient not taking: Reported on 12/07/2020)   traMADol (ULTRAM) 50 MG tablet Take 1 tablet (50 mg total) by mouth every 6 (six) hours as needed for moderate pain. (Patient not taking: Reported on 12/07/2020)   No facility-administered encounter medications on file as of 12/10/2020.   Reviewed chart for medication changes ahead of medication coordination call.  BP Readings from Last 3 Encounters:  12/07/20 107/69  09/10/20 99/63  09/01/20 120/72    Lab Results  Component Value Date   HGBA1C  09/13/2008    5.8 (NOTE)   The ADA recommends the following therapeutic goal for glycemic   control related to Hgb A1C measurement:   Goal of Therapy:   < 7.0% Hgb A1C   Reference: American Diabetes Association: Clinical Practice   Recommendations 2008, Diabetes Care,  2008, 31:(Suppl 1).    Patient obtains medications through Adherence Packaging  30 Days  Last adherence delivery included:  Aspirin 81 mg: one tablet at breakfast Allopurinol (ZYLOPRIM) 300 mg: one tablet at breakfast Donepezil (ARICEPT) 10 mg: one tablet at bedtime Ezetimibe (ZETIA) 10 mg: one tablet at breakfast Metoprolol succinate (TOPROL-XL) 25 MG 24 hr: one tablet at  breakfast Simvastatin (ZOCOR) 40 mg: one tablet at bedtime Mirtazapine (Remeron) 15 mg: one at bedtime  Patient declined medication last month due to PRN use/additional supply on hand. Fluticasone (CUTIVATE) 0.05 % cream Desoximetasone (TOPICORT) 0.25 % cream  Patient is due for next adherence delivery on: 12/18/2020. Called patient and reviewed medications and coordinated delivery. This delivery to include: Aspirin 81 mg: one tablet at bedtime Allopurinol (ZYLOPRIM) 300 mg: one tablet at  breakfast Donepezil (ARICEPT) 10 mg: one tablet at breakfast Ezetimibe (ZETIA) 10 mg: one tablet at breakfast Metoprolol succinate (TOPROL-XL) 25 MG 24 hr: one tablet at breakfast Simvastatin (ZOCOR) 40 mg: one tablet at bedtime Mirtazapine (Remeron) 15 mg: one at bedtime  Patient declined the following medications  due to PRN use Fluticasone (CUTIVATE) 0.05 % cream Desoximetasone (TOPICORT) 0.25 % cream  He currently does not need refills.  Confirmed delivery date of 12/18/2020, advised patient that pharmacy will contact them the morning of delivery.  Star Rating Drugs: Medication Dispensed  Quantity Pharmacy  Simvastatin 40 mg 06.06.2022 [facility]   Amilia Revonda Standard, [place] Pharmacist Assistant [phone]

## 2020-12-11 DIAGNOSIS — S72031D Displaced midcervical fracture of right femur, subsequent encounter for closed fracture with routine healing: Secondary | ICD-10-CM | POA: Diagnosis not present

## 2020-12-13 ENCOUNTER — Other Ambulatory Visit: Payer: Self-pay | Admitting: Cardiovascular Disease

## 2020-12-14 ENCOUNTER — Encounter: Payer: Self-pay | Admitting: Family Medicine

## 2020-12-14 ENCOUNTER — Other Ambulatory Visit: Payer: Self-pay

## 2020-12-14 ENCOUNTER — Ambulatory Visit (INDEPENDENT_AMBULATORY_CARE_PROVIDER_SITE_OTHER): Payer: Medicare Other | Admitting: Family Medicine

## 2020-12-14 VITALS — BP 122/60 | HR 69 | Temp 97.7°F | Wt 144.5 lb

## 2020-12-14 DIAGNOSIS — E785 Hyperlipidemia, unspecified: Secondary | ICD-10-CM | POA: Diagnosis not present

## 2020-12-14 DIAGNOSIS — F0280 Dementia in other diseases classified elsewhere without behavioral disturbance: Secondary | ICD-10-CM

## 2020-12-14 DIAGNOSIS — N529 Male erectile dysfunction, unspecified: Secondary | ICD-10-CM

## 2020-12-14 DIAGNOSIS — I2510 Atherosclerotic heart disease of native coronary artery without angina pectoris: Secondary | ICD-10-CM

## 2020-12-14 LAB — LIPID PANEL
Cholesterol: 135 mg/dL (ref 0–200)
HDL: 62.7 mg/dL (ref >39.00)
LDL Cholesterol: 45 mg/dL (ref 0–99)
NonHDL: 72.74
Total CHOL/HDL Ratio: 2
Triglycerides: 138 mg/dL (ref 0.0–149.0)
VLDL: 27.6 mg/dL (ref 0.0–40.0)

## 2020-12-14 LAB — HEPATIC FUNCTION PANEL
ALT: 24 U/L (ref 0–53)
AST: 29 U/L (ref 0–37)
Albumin: 4.4 g/dL (ref 3.5–5.2)
Alkaline Phosphatase: 80 U/L (ref 39–117)
Bilirubin, Direct: 0.1 mg/dL (ref 0.0–0.3)
Total Bilirubin: 0.4 mg/dL (ref 0.2–1.2)
Total Protein: 7 g/dL (ref 6.0–8.3)

## 2020-12-14 NOTE — Progress Notes (Signed)
Established Patient Office Visit  Subjective:  Patient ID: Jesus Jordan, male    DOB: 1945-12-25  Age: 75 y.o. MRN: [id number]  CC:  Chief Complaint  Patient presents with   Follow-up    HPI Jesus Jordan presents for medical follow-up.  He has history of CAD, hypertension, COPD dementia, hyperlipidemia.  Followed by neurology.  He remains on Aricept.  They recently changed his Aricept dosing to daytime as he was having vivid dreams at night.  Patient thinks this may have helped somewhat.  He had fall with right hip replacement last year.  Does not complain of hip pain at this time.  Ambulating without difficulty.  He does request Cialis prescription but has nitroglycerin and with his memory deficits would be concerned about potential issues there  Hyperlipidemia treated with Zetia and simvastatin.  Due for follow up labs soon.    Past Medical History:  Diagnosis Date   Alcohol abuse, daily use    05/29/20 - Reported consuming a 6 pack per day   CAD in native artery 05/03/2016   COPD (chronic obstructive pulmonary disease) 12/10/2018   Coronary atherosclerosis 01/29/2009   Essential hypertension 12/10/2018   GERD (gastroesophageal reflux disease) 04/18/2018   Gout    Hearing loss    History of MI (myocardial infarction) 09/12/2008   large inferolateral MI secondary to total occlusion of circumflex with VF cardiac arrest   History of nuclear stress test 02/23/2012   exercise myoview; area of scar in inferolateral wall at mid-basal region   Hyperlipidemia 03/06/2013   target LDL less than 70   Inguinal hernia 05/25/2009   Major neurocognitive disorder due to multiple etiologies without behavioral disturbance 05/29/2020    Past Surgical History:  Procedure Laterality Date   CORONARY STENT PLACEMENT  09/12/2008   r/t MI; 3.5x12mm Xience DES to circumflex & PTCA at multiple sites in distal region of vessel; alos diffusely diseaase, narrowed, nondominant RCA   FEMUR IM NAIL Right  09/03/2020   Procedure: INTRAMEDULLARY (IM) NAIL FEMORAL;  Surgeon: Rod Can, MD;  Location: [facility];  Service: Orthopedics;  Laterality: Right;   [place]   HERNIA REPAIR  2011   left inguinal hernia   TRANSTHORACIC ECHOCARDIOGRAM  03/02/2009   OX=73-53%; LV systolic function borderline reduced with mild inferior wall hypokinesis; mild-mod MR; mild TR; AV mildly sclerotic; mild aortic root dilatation    Family History  Problem Relation Age of Onset   Colon cancer Father    Alcoholism Father    Hyperlipidemia Brother    Heart disease Brother 54       massive MI   Raynaud syndrome Brother    Cancer Paternal Grandfather        colon   Dementia Sister        Unspecified type    Social History   Socioeconomic History   Marital status: Single    Spouse name: Not on file   Number of children: 3   Years of education: 12   Highest education level: High school graduate  Occupational History   Occupation: Retired    Comment: [employer]  Tobacco Use   Smoking status: Some Days    Years: 30.00    Pack years: 0.00    Types: Cigarettes   Smokeless tobacco: Never   Tobacco comments:    skip days some; pack last 3 days   Vaping Use   Vaping Use: Never used  Substance and Sexual Activity   Alcohol use:  Yes    Alcohol/week: 42.0 standard drinks    Types: 42 Cans of beer per week    Comment: 6 pack per day   Drug use: No   Sexual activity: Not on file  Other Topics Concern   Not on file  Social History Narrative   Right handed   Drinks caffeine    One story home   Social Determinants of Health   Financial Resource Strain: Low Risk    Difficulty of Paying Living Expenses: Not very hard  Food Insecurity: Not on file  Transportation Needs: No Transportation Needs   Lack of Transportation (Medical): No   Lack of Transportation (Non-Medical): No  Physical Activity: Not on file  Stress: Not on file  Social Connections: Not on file  Intimate Partner  Violence: Not on file    Outpatient Medications Prior to Visit  Medication Sig Dispense Refill   acetaminophen (TYLENOL) 325 MG tablet Take 1-2 tablets (325-650 mg total) by mouth every 6 (six) hours as needed for mild pain (pain score 1-3 or temp > 100.5). 100 tablet 0   allopurinol (ZYLOPRIM) 300 MG tablet Take 1 tablet (300 mg total) by mouth daily. 90 tablet 0   donepezil (ARICEPT) 10 MG tablet Take 1 tablet (10 mg total) by mouth at bedtime. 90 tablet 3   doxycycline (VIBRA-TABS) 100 MG tablet Take 100 mg by mouth daily. For rosacea/continuous     ezetimibe (ZETIA) 10 MG tablet Take 1 tablet by mouth once daily 90 tablet 3   folic acid (FOLVITE) 1 MG tablet Take 1 tablet (1 mg total) by mouth daily.     metoprolol succinate (TOPROL-XL) 25 MG 24 hr tablet Take 1 tablet (25 mg total) by mouth daily. 90 tablet 3   mirtazapine (REMERON) 15 MG tablet Take 15 mg by mouth at bedtime.     Multiple Vitamin (MULTIVITAMIN WITH MINERALS) TABS tablet Take 1 tablet by mouth daily.     ondansetron (ZOFRAN) 4 MG tablet Take 1 tablet (4 mg total) by mouth every 6 (six) hours as needed for nausea. 20 tablet 0   polyethylene glycol (MIRALAX / GLYCOLAX) 17 g packet Take 17 g by mouth daily as needed for mild constipation. 14 each 0   QUEtiapine (SEROQUEL) 25 MG tablet Take 1 tablet (25 mg total) by mouth at bedtime as needed (agitaiton and aggression).     senna-docusate (SENOKOT-S) 8.6-50 MG tablet Take 1 tablet by mouth 2 (two) times daily.     simvastatin (ZOCOR) 40 MG tablet TAKE 1 TABLET BY MOUTH AT BEDTIME (Patient taking differently: Take 40 mg by mouth daily at 6 PM.) 90 tablet 3   tetrahydrozoline-zinc (VISINE-AC) 0.05-0.25 % ophthalmic solution Place 2 drops into both eyes 3 (three) times daily as needed (for itching ot redness).      thiamine 100 MG tablet Take 1 tablet (100 mg total) by mouth daily.     traMADol (ULTRAM) 50 MG tablet Take 1 tablet (50 mg total) by mouth every 6 (six) hours as  needed for moderate pain. 30 tablet 0   No facility-administered medications prior to visit.    Allergies  Allergen Reactions   Imdur [Isosorbide Nitrate] Other (See Comments)    Caused vertigo   Lisinopril Other (See Comments)    Possible vertigo??    ROS Review of Systems  Constitutional:  Negative for fatigue.  Eyes:  Negative for visual disturbance.  Respiratory:  Negative for cough, chest tightness and shortness of breath.  Cardiovascular:  Negative for chest pain, palpitations and leg swelling.  Neurological:  Negative for dizziness, syncope, weakness, light-headedness and headaches.     Objective:    Physical Exam Constitutional:      Appearance: He is well-developed.  HENT:     Right Ear: External ear normal.     Left Ear: External ear normal.  Eyes:     Pupils: Pupils are equal, round, and reactive to light.  Neck:     Thyroid: No thyromegaly.  Cardiovascular:     Rate and Rhythm: Normal rate and regular rhythm.  Pulmonary:     Effort: Pulmonary effort is normal. No respiratory distress.     Breath sounds: Normal breath sounds. No wheezing or rales.  Musculoskeletal:     Cervical back: Neck supple.  Neurological:     Mental Status: He is alert.    BP 122/60 (BP Location: Left Arm, Patient Position: Sitting, Cuff Size: Normal)   Pulse 69   Temp 97.7 F (36.5 C) (Oral)   Wt 144 lb 8 oz (65.5 kg)   SpO2 99%   BMI 20.73 kg/m  Wt Readings from Last 3 Encounters:  12/14/20 144 lb 8 oz (65.5 kg)  12/07/20 142 lb (64.4 kg)  09/02/20 175 lb 0.7 oz (79.4 kg)     Health Maintenance Due  Topic Date Due   Zoster Vaccines- Shingrix (1 of 2) Never done   TETANUS/TDAP  01/08/2020   COVID-19 Vaccine (3 - Booster for Pfizer series) 01/26/2020   COLONOSCOPY (Pts 45-44yrs Insurance coverage will need to be confirmed)  05/06/2020    There are no preventive care reminders to display for this patient.  Lab Results  Component Value Date   TSH 0.402 09/04/2020    Lab Results  Component Value Date   WBC 7.9 09/09/2020   HGB 10.9 (L) 09/09/2020   HCT 33.7 (L) 09/09/2020   MCV 101.2 (H) 09/09/2020   PLT 174 09/09/2020   Lab Results  Component Value Date   NA 135 09/09/2020   K 3.9 09/09/2020   CO2 26 09/09/2020   GLUCOSE 99 09/09/2020   BUN 25 (H) 09/09/2020   CREATININE 0.96 09/09/2020   BILITOT 1.3 (H) 09/02/2020   ALKPHOS 64 09/02/2020   AST 28 09/02/2020   ALT 20 09/02/2020   PROT 7.1 09/02/2020   ALBUMIN 4.1 09/02/2020   CALCIUM 8.8 (L) 09/09/2020   ANIONGAP 6 09/09/2020   GFR 95.90 07/10/2017   Lab Results  Component Value Date   CHOL 140 02/10/2020   Lab Results  Component Value Date   HDL 76 02/10/2020   Lab Results  Component Value Date   LDLCALC 49 02/10/2020   Lab Results  Component Value Date   TRIG 66 02/10/2020   Lab Results  Component Value Date   CHOLHDL 1.8 02/10/2020   Lab Results  Component Value Date   HGBA1C  09/13/2008    5.8 (NOTE)   The ADA recommends the following therapeutic goal for glycemic   control related to Hgb A1C measurement:   Goal of Therapy:   < 7.0% Hgb A1C   Reference: American Diabetes Association: Clinical Practice   Recommendations 2008, Diabetes Care,  2008, 31:(Suppl 1).      Assessment & Plan:   #1 dementia treated with Aricept.  He is followed by neurology.  Continue current medications.  #2 dyslipidemia.  Goal LDL less than 70 -Recheck lipid and hepatic panel -Continue simvastatin 40 mg daily and Zetia 10 mg  daily.    #3 history of erectile dysfunction.  Patient requesting prescription for Cialis.  With his memory deficits I would be concerned about having this around with his nitroglycerin use and potential for interaction.  Spoke with his daughter and she agrees.    No orders of the defined types were placed in this encounter.   Follow-up: Return in about 6 months (around 06/15/2021).    [name], MD

## 2020-12-17 ENCOUNTER — Other Ambulatory Visit: Payer: Self-pay | Admitting: Cardiovascular Disease

## 2020-12-17 ENCOUNTER — Other Ambulatory Visit: Payer: Self-pay | Admitting: Physician Assistant

## 2020-12-17 ENCOUNTER — Other Ambulatory Visit: Payer: Self-pay | Admitting: Family Medicine

## 2020-12-17 NOTE — Telephone Encounter (Signed)
Rx(s) sent to pharmacy electronically.  

## 2020-12-18 ENCOUNTER — Other Ambulatory Visit: Payer: Self-pay

## 2020-12-18 ENCOUNTER — Other Ambulatory Visit: Payer: Medicare Other | Admitting: Nurse Practitioner

## 2020-12-18 DIAGNOSIS — F0391 Unspecified dementia with behavioral disturbance: Secondary | ICD-10-CM

## 2020-12-18 DIAGNOSIS — F1010 Alcohol abuse, uncomplicated: Secondary | ICD-10-CM

## 2020-12-18 DIAGNOSIS — Z515 Encounter for palliative care: Secondary | ICD-10-CM

## 2020-12-18 DIAGNOSIS — K5901 Slow transit constipation: Secondary | ICD-10-CM

## 2020-12-18 NOTE — Progress Notes (Signed)
[facility] Palliative Care Consult Note Telephone: [phone]  Fax: [phone]    Date of encounter: 12/18/20 PATIENT NAME: Jesus Jordan [address] [place] [address]   [phone] (home)  DOB: 17-Jan-1946 MRN: [id number]  PRIMARY CARE PROVIDER:    Eulas Post, MD,  [place] [address] [phone]  REFERRING PROVIDER:   Eulas Post, MD [address] [place],  [place] [address] [phone]  RESPONSIBLE PARTY:    Contact Information     Name Relation Home Work Mobile   Jesus Jordan Daughter [phone]  [phone]     I met face to face with patient in home. [name] Jesus Jordan present during visit. Palliative Care was asked to follow this patient by consultation request of  Burchette, Alinda Sierras, MD to address advance care planning and complex medical decision making. This is a follow up visit.                                  ASSESSMENT AND PLAN / RECOMMENDATIONS:   Advance Care Planning/Goals of Care: Goals include to maximize quality of life and symptom management. Our advance care planning conversation included a discussion about:    The value and importance of advance care planning  Exploration of personal, cultural or spiritual beliefs that might influence medical decisions  Exploration of goals of care in the event of a sudden injury or illness  Review and updating or creation of an  advance directive document . CODE STATUS: Full code Goal of care: Patients goal of care is function. Directives: Patient reiterated desire for attempt at resuscitation in the event of cardiac or respiratory arrest, saying he has had resuscitation with success in the past when he had heart attack. He verbalized decision to remain a full code.  The need to complete a MOST form was discussed, patient and his daughter expressed interest in completing the MOST form today. We discussed and reviewed sections of  the form in detail, opportunity for questions given, all questions answered. Form completed and signed with patient, signed form given to daughter to keep, copy of MOST form uploaded to [facility] EMR. Details of MOST form include limited additional intervention, antibiotics if indicated, feeding tube for a defined trial period. Patient and daughter requested for assistance completing documentation for power of attorney assignment. Will refer patient to palliative care social worker for assistance. Palliative care will continue to provide support to patient, family and the medical team.  I spent 30 minutes providing this consultation. More than 50% of the time in this consultation was spent in counseling and care coordination. -----------------------------------------------------------------------------------  Symptoms management/plan: Constipation: Encouraged consistent use of Colace daily and Miralax 17g by mouth if no bowel movement in more than 2 days. Encouraged adequate oral fluid intake to promote hydration. Daughter report adding liquid IV nutritional supplement to his water. Dementia: daughter report occasional hallucination. Patient seen by his neurologist Dr. Delice Lesch, advised to switch Aricept administration from evening to morning time. Patient report some improvement in symptoms with morning administration. Continue supportive care. Encourage participation in brain stimulating activities.  Alcohol abuse: Per patient's daughter, condition is improved. Saying patient now drinks mostly zero percent alcohol beer and 2.5% alcohol beer. Total abstinence encouraged. Patient verbalized understanding.  Provided general support and encouragement. Questions and concerns were addressed. Patient and family was encouraged to call with questions and/or concerns.  Follow up Palliative Care Visit: Palliative care will continue to follow for complex medical decision making, advance care planning, and  clarification of goals. Return in about 6-8 weeks or prn.  PPS: 70%  HOSPICE ELIGIBILITY/DIAGNOSIS: TBD  CHIEF COMPLAIN: follow up on constipation  History obtained from review of Epic EMR and discussion with Jesus Jordan and his daughter.  HISTORY OF PRESENT ILLNESS:  Jesus Jordan is a 75 y.o. year old male with medical problems including Dementia with alcohol abuse (FAST 4), COPD, HTN, GERD, HLD. Patient report ongoing constipation, report last bowel movement was 3 days ago, report not taking Colace and Miralax as prescribed saying he takes it once in a while. He report straining when having bowel movement. Daughter also report patient with poor fluid intake. Report patient not wanting to drink water. Report patient continues to drink about 6 cans of beer a day, mostly low alcohol content beer(2.4% alcohol).  Patient denied abdominal pain, denied abdominal distention, denied nausea or vomiting. He report feeling well overall, walks without assistive device, no report of recent falls. Denied any uncontrolled pain or pain in his surgical hip. Ten systems reviewed and are negative for acute change, except as noted in the HPI.   I reviewed available labs, medications, imaging, studies and related documents from the EMR.  Records reviewed and summarized above.   Physical Exam: General: frail appearing, cooperative, sitting in chair in NAD actively participating in visit discussions. EYES: anicteric sclera, no discharge ENMT: hearing aids in, oral mucous membranes moist CV: no LE edema Pulmonary: no increased work of breathing, no cough, room air Abdomen: no ascites GU: deferred MSK: no sarcopenia, moves all extremities, ambulatory Skin: warm and dry, no rashes or wounds on visible skin Neuro:  generalized weakness,  no cognitive impairment Psych: non-anxious affect, A and O x 4 Hem/lymph/immuno: no widespread bruising  Past Medical History:  Diagnosis Date   Alcohol abuse, daily use    05/29/20  - Reported consuming a 6 pack per day   CAD in native artery 05/03/2016   COPD (chronic obstructive pulmonary disease) 12/10/2018   Coronary atherosclerosis 01/29/2009   Essential hypertension 12/10/2018   GERD (gastroesophageal reflux disease) 04/18/2018   Gout    Hearing loss    History of MI (myocardial infarction) 09/12/2008   large inferolateral MI secondary to total occlusion of circumflex with VF cardiac arrest   History of nuclear stress test 02/23/2012   exercise myoview; area of scar in inferolateral wall at mid-basal region   Hyperlipidemia 03/06/2013   target LDL less than 70   Inguinal hernia 05/25/2009   Major neurocognitive disorder due to multiple etiologies without behavioral disturbance 05/29/2020    Thank you for the opportunity to participate in the care of Jesus Jordan.  The palliative care team will continue to follow. Please call our office at [phone] if we can be of additional assistance.   Colbi Schiltz DNP, AGPCNP-BC  COVID-19 PATIENT SCREENING TOOL Asked and negative response unless otherwise noted:   Have you had symptoms of covid, tested positive or been in contact with someone with symptoms/positive test in the past 5-10 days?

## 2021-01-01 DIAGNOSIS — Z20822 Contact with and (suspected) exposure to covid-19: Secondary | ICD-10-CM | POA: Diagnosis not present

## 2021-01-11 ENCOUNTER — Telehealth: Payer: Self-pay | Admitting: Pharmacist

## 2021-01-11 NOTE — Chronic Care Management (AMB) (Signed)
    Chronic Care Management Pharmacy Assistant   Name: Jesus Jordan  MRN: [id number] DOB: January 13, 1946  01-11-2021- Patient called to remind of appointment with [name] Clinical Pharmacist on 01-12-2021 at 12p via phone call   No answer, left message of appointment date, time and type of appointment (via phone call). Left message to have all medications, supplements, blood pressure and/or blood sugar logs available during appointment and to return call if need to reschedule.   Care Gaps:  Zoster Vaccine _ Overdue TDAP - Overdue Covid Booster #3 Therapist, music) - Overdue Colonoscopy - Overdue  Star Rating Drug:  Simvastatin '40mg'$  - Last filled 12-17-2020 30DS at [facility]  Any gaps in medications fill history? None   Medications: Outpatient Encounter Medications as of 01/11/2021  Medication Sig   acetaminophen (TYLENOL) 325 MG tablet Take 1-2 tablets (325-650 mg total) by mouth every 6 (six) hours as needed for mild pain (pain score 1-3 or temp > 100.5).   allopurinol (ZYLOPRIM) 300 MG tablet TAKE ONE TABLET BY MOUTH EVERY MORNING   donepezil (ARICEPT) 10 MG tablet Take 1 tablet (10 mg total) by mouth at bedtime.   doxycycline (VIBRA-TABS) 100 MG tablet Take 100 mg by mouth daily. For rosacea/continuous   ezetimibe (ZETIA) 10 MG tablet Take 1 tablet (10 mg total) by mouth every morning. NEED APPT   folic acid (FOLVITE) 1 MG tablet Take 1 tablet (1 mg total) by mouth daily.   metoprolol succinate (TOPROL-XL) 25 MG 24 hr tablet Take 1 tablet (25 mg total) by mouth daily.   mirtazapine (REMERON) 15 MG tablet TAKE ONE TABLET BY MOUTH EVERYDAY AT BEDTIME   Multiple Vitamin (MULTIVITAMIN WITH MINERALS) TABS tablet Take 1 tablet by mouth daily.   ondansetron (ZOFRAN) 4 MG tablet Take 1 tablet (4 mg total) by mouth every 6 (six) hours as needed for nausea.   polyethylene glycol (MIRALAX / GLYCOLAX) 17 g packet Take 17 g by mouth daily as needed for mild constipation.   senna-docusate (SENOKOT-S)  8.6-50 MG tablet Take 1 tablet by mouth 2 (two) times daily.   simvastatin (ZOCOR) 40 MG tablet Take 1 tablet (40 mg total) by mouth daily at 6 PM. NEED APPT   tetrahydrozoline-zinc (VISINE-AC) 0.05-0.25 % ophthalmic solution Place 2 drops into both eyes 3 (three) times daily as needed (for itching ot redness).    thiamine 100 MG tablet Take 1 tablet (100 mg total) by mouth daily.   traMADol (ULTRAM) 50 MG tablet Take 1 tablet (50 mg total) by mouth every 6 (six) hours as needed for moderate pain.   No facility-administered encounter medications on file as of 01/11/2021.    [facility] Clinical Pharmacist Assistant [phone]

## 2021-01-12 ENCOUNTER — Ambulatory Visit (INDEPENDENT_AMBULATORY_CARE_PROVIDER_SITE_OTHER): Payer: Medicare Other | Admitting: Pharmacist

## 2021-01-12 DIAGNOSIS — I10 Essential (primary) hypertension: Secondary | ICD-10-CM | POA: Diagnosis not present

## 2021-01-12 DIAGNOSIS — I2510 Atherosclerotic heart disease of native coronary artery without angina pectoris: Secondary | ICD-10-CM

## 2021-01-12 DIAGNOSIS — E785 Hyperlipidemia, unspecified: Secondary | ICD-10-CM | POA: Diagnosis not present

## 2021-01-12 NOTE — Progress Notes (Signed)
Chronic Care Management Pharmacy Note  01/17/2021 Name:  Jesus Jordan MRN:  [id number] DOB:  1945/12/13  Summary: Pt has had worsening of sleep and is having daytime hallucinations  Recommendations/Changes made from today's visit: -Recommended discussing switching donepezil to rivastigmine patches due to lower incidence of insomnia -Recommended switching simvastatin and ezetimibe to combination product to simplify medication regimen -Recommended adding docusate to medication packaging to ensure patient is taking daily  Plan: Follow up in 6 months  Subjective: Jesus Jordan is an 75 y.o. year old male who is a primary patient of Burchette, Alinda Sierras, MD.  The CCM team was consulted for assistance with disease management and care coordination needs.    Engaged with patient by telephone for follow up visit in response to provider referral for pharmacy case management and/or care coordination services.   Consent to Services:  The patient was given information about Chronic Care Management services, agreed to services, and gave verbal consent prior to initiation of services.  Please see initial visit note for detailed documentation.   Patient Care Team: Eulas Post, MD as PCP - General Troy Sine, MD as PCP - Cardiology (Cardiology) Viona Gilmore, [facility] as Pharmacist (Pharmacist)  Recent office visits: 09/01/20 [name], MD: Patient presented for carpal tunnel. Recommended night splint.  12/14/20 [name], MD: Patient presented for chronic conditions follow up. Lipid panel WNL. Patient requested Cialis rx but concerned about memory with interaction with nitroglycerin use.  Recent consult visits: 12/18/20 Jari Favre, NP (palliative care): Patient presented for palliative care home visit. Recommended use of colace daily and Miralax 17 g if no bowel movement in more than 2 days.  12/11/20 Rod Can (ortho): Patient presented for femur fracture follow up.  Unable to access notes.  11/23/20 Ellouise Newer, MD (neurology): Patient presented for loss of memory follow up. Recommended moving Aricept to breakfast to help with vivid dreams.  10/30/20 Cherlynn June, PA (ortho): Patient presented for femur fracture follow up. Unable to access notes.  10/27/20 Jari Favre, NP (palliative care): Patient presented for palliative care home visit.   10/02/20 Cherlynn June, PA (ortho): Patient presented for femur fracture follow up. Unable to access notes.  Hospital visits: Medication Reconciliation was completed by comparing discharge summary, patient's EMR and Pharmacy list, and upon discussion with patient.   Admitted to the hospital on 09/01/2020 due to Right Hip Fracture. Discharge date was 09/10/2020. Discharged from [facility]Medications Started at [facility] Discharge:?? -started the following medication due to right hip fracture. Acetaminophen (TYLENOL) 325 MG tablet every six hours as needed quantity 100 Tramadol (ULTRAM) 50 MG tablet every six hours as needed quantity 30   Medications that remain the same after Hospital Discharge:?? -All other medications will remain the same.     Objective:  Lab Results  Component Value Date   CREATININE 0.96 09/09/2020   BUN 25 (H) 09/09/2020   GFR 95.90 07/10/2017   GFRNONAA >60 09/09/2020   GFRAA >60 08/28/2019   NA 135 09/09/2020   K 3.9 09/09/2020   CALCIUM 8.8 (L) 09/09/2020   CO2 26 09/09/2020   GLUCOSE 99 09/09/2020    Lab Results  Component Value Date/Time   HGBA1C  09/13/2008 03:30 AM    5.8 (NOTE)   The ADA recommends the following therapeutic goal for glycemic   control related to Hgb A1C measurement:   Goal of Therapy:   < 7.0% Hgb A1C   Reference: American Diabetes  Association: Clinical Practice   Recommendations 2008, Diabetes Care,  2008, 31:(Suppl 1).   GFR 95.90 07/10/2017 03:45 PM   GFR 100.39 03/21/2016 12:19 PM    Last diabetic Eye exam: No  results found for: HMDIABEYEEXA  Last diabetic Foot exam: No results found for: HMDIABFOOTEX   Lab Results  Component Value Date   CHOL 135 12/14/2020   HDL 62.70 12/14/2020   LDLCALC 45 12/14/2020   TRIG 138.0 12/14/2020   CHOLHDL 2 12/14/2020    Hepatic Function Latest Ref Rng & Units 12/14/2020 09/02/2020 02/10/2020  Total Protein 6.0 - 8.3 g/dL 7.0 7.1 6.9  Albumin 3.5 - 5.2 g/dL 4.4 4.1 -  AST 0 - 37 U/L _0 ALT 0 - 53 U/L _1 Alk Phosphatase 39 - 117 U/L 80 64 -  Total Bilirubin 0.2 - 1.2 mg/dL 0.4 1.3(H) 0.8  Bilirubin, Direct 0.0 - 0.3 mg/dL 0.1 - 0.2    Lab Results  Component Value Date/Time   TSH 0.402 09/04/2020 05:22 AM   TSH 1.40 02/10/2020 11:48 AM   TSH 0.68 03/21/2016 12:19 PM    CBC Latest Ref Rng & Units 09/09/2020 09/07/2020 09/06/2020  WBC 4.0 - 10.5 K/uL 7.9 6.9 8.6  Hemoglobin 13.0 - 17.0 g/dL 10.9(L) 12.2(L) 12.0(L)  Hematocrit 39.0 - 52.0 % 33.7(L) 37.2(L) 36.1(L)  Platelets 150 - 400 K/uL 174 144(L) 141(L)    Lab Results  Component Value Date/Time   VD25OH 35.85 09/02/2020 05:14 PM    Clinical ASCVD: Yes  The ASCVD Risk score Mikey Bussing DC Jr., et al., 2013) failed to calculate for the following reasons:   The patient has a prior MI or stroke diagnosis    Depression screen [facility] 2/9 07/14/2020 04/29/2019 04/18/2018  Decreased Interest 0 1 1  Down, Depressed, Hopeless 1 0 1  PHQ - 2 Score _2 Altered sleeping 1 - 1  Tired, decreased energy 1 - 0  Change in appetite 0 - 1  Feeling bad or failure about yourself  1 - 0  Trouble concentrating 1 - 1  Moving slowly or fidgety/restless 1 - 0  Suicidal thoughts 0 - 0  PHQ-9 Score 6 - 5  Difficult doing work/chores - - Somewhat difficult     Social History   Tobacco Use  Smoking Status Some Days   Years: 30.00   Types: Cigarettes  Smokeless Tobacco Never  Tobacco Comments   skip days some; pack last 3 days    BP Readings from Last 3 Encounters:  12/14/20 122/60  12/07/20 107/69   09/10/20 99/63   Pulse Readings from Last 3 Encounters:  12/14/20 69  12/07/20 67  09/10/20 74   Wt Readings from Last 3 Encounters:  12/14/20 144 lb 8 oz (65.5 kg)  12/07/20 142 lb (64.4 kg)  09/02/20 175 lb 0.7 oz (79.4 kg)   BMI Readings from Last 3 Encounters:  12/14/20 20.73 kg/m  12/07/20 20.37 kg/m  09/02/20 25.12 kg/m    Assessment/Interventions: Review of patient past medical history, allergies, medications, health status, including review of consultants reports, laboratory and other test data, was performed as part of comprehensive evaluation and provision of chronic care management services.   SDOH:  (Social Determinants of Health) assessments and interventions performed: No  SDOH Screenings   Alcohol Screen: Not on file  Depression (PHQ2-9): Medium Risk   PHQ-2 Score: 6  Financial Resource Strain: Low Risk    Difficulty of Paying Living Expenses: Not very  hard  Food Insecurity: Not on file  Housing: Not on file  Physical Activity: Not on file  Social Connections: Not on file  Stress: Not on file  Tobacco Use: High Risk   Smoking Tobacco Use: Some Days   Smokeless Tobacco Use: Never  Transportation Needs: No Transportation Needs   Lack of Transportation (Medical): No   Lack of Transportation (Non-Medical): No    CCM Care Plan  Allergies  Allergen Reactions   Imdur [Isosorbide Nitrate] Other (See Comments)    Caused vertigo   Lisinopril Other (See Comments)    Possible vertigo??    Medications Reviewed Today     Reviewed by Eulas Post, MD (Physician) on 12/14/20 at [place] List Status: <None>   Medication Order Taking? Sig Documenting Provider Last Dose Status Informant  acetaminophen (TYLENOL) 325 MG tablet [id number] Yes Take 1-2 tablets (325-650 mg total) by mouth every 6 (six) hours as needed for mild pain (pain score 1-3 or temp > 100.5). Dorothyann Peng, PA Taking Active   allopurinol (ZYLOPRIM) 300 MG tablet [id number] Yes Take  1 tablet (300 mg total) by mouth daily. Eulas Post, MD Taking Active Child  donepezil (ARICEPT) 10 MG tablet [id number] Yes Take 1 tablet (10 mg total) by mouth at bedtime. Eulas Post, MD Taking Active Child  doxycycline (VIBRA-TABS) 100 MG tablet [id number] Yes Take 100 mg by mouth daily. For rosacea/continuous [provider] Taking Active   ezetimibe (ZETIA) 10 MG tablet [id number] Yes Take 1 tablet by mouth once daily Almyra Deforest, [place] Taking Active   folic acid (FOLVITE) 1 MG tablet [id number] Yes Take 1 tablet (1 mg total) by mouth daily. Georgette Shell, MD Taking Active   metoprolol succinate (TOPROL-XL) 25 MG 24 hr tablet [id number] Yes Take 1 tablet (25 mg total) by mouth daily. Eulas Post, MD Taking Active Child  mirtazapine (REMERON) 15 MG tablet [id number] Yes Take 15 mg by mouth at bedtime. [provider] Taking Active Child  Multiple Vitamin (MULTIVITAMIN WITH MINERALS) TABS tablet [id number] Yes Take 1 tablet by mouth daily. Georgette Shell, MD Taking Active   ondansetron [facility]) 4 MG tablet [id number] Yes Take 1 tablet (4 mg total) by mouth every 6 (six) hours as needed for nausea. Georgette Shell, MD Taking Active   polyethylene glycol [facility] / GLYCOLAX) 17 g packet [id number] Yes Take 17 g by mouth daily as needed for mild constipation. Georgette Shell, MD Taking Active   QUEtiapine (SEROQUEL) 25 MG tablet [id number] Yes Take 1 tablet (25 mg total) by mouth at bedtime as needed (agitaiton and aggression). Georgette Shell, MD Taking Active   senna-docusate (SENOKOT-S) 8.6-50 MG tablet [id number] Yes Take 1 tablet by mouth 2 (two) times daily. Georgette Shell, MD Taking Active   simvastatin (ZOCOR) 40 MG tablet [id number] Yes TAKE 1 TABLET BY MOUTH AT BEDTIME  Patient taking differently: Take 40 mg by mouth daily at 6 PM.   Troy Sine, MD Taking Active Child  tetrahydrozoline-zinc (VISINE-AC) 0.05-0.25 % ophthalmic  solution [id number] Yes Place 2 drops into both eyes 3 (three) times daily as needed (for itching ot redness).  [provider] Taking Active Child  thiamine 100 MG tablet [id number] Yes Take 1 tablet (100 mg total) by mouth daily. Georgette Shell, MD Taking Active   traMADol Veatrice [facility]bon) 50 MG tablet [id number] Yes Take 1 tablet (50 mg total) by mouth every 6 (six) hours as needed for moderate pain.  Dorothyann Peng, [place] Taking Active             Patient Active Problem List   Diagnosis Date Noted   Hip fracture ([place]) 09/03/2020   Closed right hip fracture ([place]) 09/02/2020   Major neurocognitive disorder due to multiple etiologies without behavioral disturbance 05/29/2020   Alcohol abuse, daily use    Hearing loss    COPD (chronic obstructive pulmonary disease) 12/10/2018   Essential hypertension 12/10/2018   Chest pain 12/09/2018   GERD (gastroesophageal reflux disease) 04/18/2018   CAD in native artery 05/03/2016   Hyperlipidemia with target LDL less than 70 03/06/2013   Inguinal hernia 05/25/2009   Gout 01/29/2009   Coronary atherosclerosis 01/29/2009   History of MI (myocardial infarction) 09/12/2008    Immunization History  Administered Date(s) Administered   Fluad Quad(high Dose 65+) 02/08/2019, 03/06/2020   Influenza, High Dose Seasonal PF 04/30/2013, 05/12/2014, 05/12/2015, 03/21/2016, 04/20/2017   Influenza-Unspecified 03/13/2018, 02/19/2019   PFIZER(Purple Top)SARS-COV-2 Vaccination 08/01/2019, 08/26/2019   Pneumococcal Conjugate-13 01/14/2015, 03/13/2018   Pneumococcal Polysaccharide-23 01/07/2010, 03/21/2016   Pneumococcal-Unspecified 05/24/2019   Td 06/20/1993, 01/07/2010   Zoster, Live 02/14/2012   Patient is now having hallucinations during the day and trouble sleeping since moving donepezil to the morning. [name] will discuss next steps with neurology such as switching medications or returning to bedtime administration of donepezil.    [name] is also aware that patient needs an appt with cardiology before medication changes are made to cardiac meds.  Conditions to be addressed/monitored:  Hypertension, Hyperlipidemia, Coronary Artery Disease, GERD, Depression, Gout, and Memory loss  Conditions addressed this visit: Hypertension, hyperlipidemia, memory loss  Care Plan : [place]  Updates made by Viona Gilmore, [place] since 01/17/2021 12:00 AM     Problem: Problem: Hypertension, Hyperlipidemia, Coronary Artery Disease, GERD, Depression, Gout, and Memory loss      Long-Range Goal: Patient-Specific Goal   Start Date: 01/12/2021  Expected End Date: 01/12/2022  This Visit's Progress: On track  Priority: High  Note:   Current Barriers:  Unable to independently monitor therapeutic efficacy  Pharmacist Clinical Goal(s):  Patient will achieve adherence to monitoring guidelines and medication adherence to achieve therapeutic efficacy through collaboration with PharmD and provider.   Interventions: 1:1 collaboration with Eulas Post, MD regarding development and update of comprehensive plan of care as evidenced by provider attestation and co-signature Inter-disciplinary care team collaboration (see longitudinal plan of care) Comprehensive medication review performed; medication list updated in electronic medical record  Hypertension  (Status:Goal on track: YES.)   Med Management Intervention:  No medication changes  (BP goal <130/80) -Controlled -Current treatment: Metoprolol succinate 25 mg 1 tablet daily -Medications previously tried: carvedilol (adherence), lisinopril (vertigo)  -Current home readings: 114/72 (checking every other day) -Current dietary habits: limiting salt intake -Current exercise habits: limited due to recent fall and fracture -Denies hypotensive/hypertensive symptoms -Educated on BP goals and benefits of medications for prevention of heart attack, stroke and  kidney damage; Importance of home blood pressure monitoring; Proper BP monitoring technique; -Counseled to monitor BP at home weekly, document, and provide log at future appointments -Counseled on diet and exercise extensively Recommended to continue current medication  Hyperlipidemia: (LDL goal < 70) -Controlled -Current treatment: Simvastatin 40 mg 1 tablet daily Ezetimibe 10 mg 1 tablet daily -Medications previously tried: none  -Current dietary patterns: did not discuss -Current exercise habits: limited due to recent fall and fracture -Educated on Cholesterol goals;  Importance of  limiting foods high in cholesterol; Exercise goal of 150 minutes per week; -Counseled on diet and exercise extensively Recommended combining medications into one tablet. Patient needs a cardiology appt before this can be done.  CAD/History of MI (Goal: prevent heart events) -Controlled -Current treatment  Aspirin 81 mg 1 tablet daily in PM Ezetimibe 10 mg 1 tablet daily Simvastatin 40 mg 1 tablet daily Nitroglycerin 0.4 mg SL tablet PRN -Medications previously tried: clopidogrel (side effects)  -Recommended to continue current medication  Depression (Goal: minimize symptoms) -Controlled -Current treatment: Mirtazapine 15 mg 1 tablet at bedtime -Medications previously tried/failed: n/a -PHQ9: 6 -Educated on Benefits of medication for symptom control -Recommended to continue current medication  Gout (Goal: prevent flare ups and maintain uric acid < 6) -Controlled -Current treatment  Allopurinol 300 mg 1 tablet daily -Medications previously tried: none  -Recommended to continue current medication  Memory loss (Goal: slow memory loss) -Not ideally controlled -Current treatment  Donepezil 10 mg 1 tablet in the morning -Medications previously tried: none  -Recommended to reach out to neurologist to discuss alternative therapy. Discussed possibly switching to rivastigmine patches to minimize  side effects depending on coverage/cost. [name] will discuss wit h neurology.  GERD (Goal: minimize symptoms) -Controlled -Current treatment  Tums 500 mg 1-2 tablets as needed Pantoprazole 40 mg 1 tablet daily as needed -Medications previously tried: none  -Recommended to continue current medication   Health Maintenance -Vaccine gaps: shingrix, COVID booster, tetanus -Current therapy:  Desoximetasone 0.25% cream apply twice daily as directed Fluticasone 0.05% cream apply daily as needed Visine AC 0.05%-0.25% apply 2 drops in both eyes three times daily Milk thistle 1000 mg 1 capsule daily -Educated on Cost vs benefit of each product must be carefully weighed by individual consumer -Patient is satisfied with current therapy and denies issues -Recommended adding docusate to packaging to improve adherence and constipation.  Patient Goals/Self-Care Activities Patient will:  - take medications as prescribed check blood pressure weekly, document, and provide at future appointments  Follow Up Plan: Telephone follow up appointment with care management team member scheduled for: 6 months       Medication Assistance: None required.  Patient affirms current coverage meets needs.  Compliance/Adherence/Medication fill history: Care Gaps: Shingrix, tetanus, COVID booster, colonoscopy  Star-Rating Drugs: Simvastatin 52m - Last filled 12-17-2020 30DS at [facility]  [facility]  [facility] [address]Phone: [phone]: [phone] [facility] [place] [place]Phone: [phone]: [phone] Uses pill box? No - adherence packaging Pt endorses 95% compliance  We discussed: Benefits of medication synchronization, packaging and delivery as well as enhanced  pharmacist oversight with [facility]. Patient decided to: Utilize [facility] pharmacy for medication synchronization, packaging and delivery  Care Plan and Follow Up Patient Decision:  Patient agrees to Care Plan and Follow-up.  Plan: Telephone follow up appointment with care management team member scheduled for:  6 months  MJeni Salles PharmD, BPea RidgePharmacist [phone]

## 2021-01-18 ENCOUNTER — Telehealth: Payer: Self-pay | Admitting: Pharmacist

## 2021-01-18 NOTE — Telephone Encounter (Signed)
Called patient's daughter and confirmed no changes with medications. Will plan to leave donepezil in the morning and she is aware to set up a cardiology appointment for the patient to make changes to cholesterol medications. [name] requested the addition of stool softener to packaging to improve compliance.

## 2021-01-18 NOTE — Telephone Encounter (Signed)
Called patient's daughter and left voicemail for her to call back about plan for next delivery of medications from [facility]. Will wait for call back about donepezil as unsure if patient will continue on it.

## 2021-02-08 ENCOUNTER — Other Ambulatory Visit: Payer: Self-pay | Admitting: Family Medicine

## 2021-02-08 ENCOUNTER — Other Ambulatory Visit: Payer: Self-pay | Admitting: Cardiovascular Disease

## 2021-02-09 ENCOUNTER — Telehealth: Payer: Self-pay | Admitting: Pharmacist

## 2021-02-09 NOTE — Chronic Care Management (AMB) (Addendum)
Chronic Care Management Pharmacy Assistant   Name: Jesus Jordan  MRN: [id number] DOB: May 07, 1946  Reason for Encounter: Medication Review/ Medication Coordination Call.    Recent office visits:  None.  Recent consult visits:  None.  Hospital visits:  None in previous 6 months  Medications: Outpatient Encounter Medications as of 02/09/2021  Medication Sig   acetaminophen (TYLENOL) 325 MG tablet Take 1-2 tablets (325-650 mg total) by mouth every 6 (six) hours as needed for mild pain (pain score 1-3 or temp > 100.5).   allopurinol (ZYLOPRIM) 300 MG tablet TAKE ONE TABLET BY MOUTH EVERY MORNING   donepezil (ARICEPT) 10 MG tablet Take 1 tablet (10 mg total) by mouth at bedtime.   doxycycline (VIBRA-TABS) 100 MG tablet Take 100 mg by mouth daily. For rosacea/continuous   ezetimibe (ZETIA) 10 MG tablet TAKE ONE TABLET BY MOUTH EVERY MORNING Needs appointment for further refills   folic acid (FOLVITE) 1 MG tablet Take 1 tablet (1 mg total) by mouth daily.   metoprolol succinate (TOPROL-XL) 25 MG 24 hr tablet Take 1 tablet (25 mg total) by mouth daily.   mirtazapine (REMERON) 15 MG tablet TAKE ONE TABLET BY MOUTH EVERYDAY AT BEDTIME   Multiple Vitamin (MULTIVITAMIN WITH MINERALS) TABS tablet Take 1 tablet by mouth daily.   ondansetron (ZOFRAN) 4 MG tablet Take 1 tablet (4 mg total) by mouth every 6 (six) hours as needed for nausea.   polyethylene glycol (MIRALAX / GLYCOLAX) 17 g packet Take 17 g by mouth daily as needed for mild constipation.   senna-docusate (SENOKOT-S) 8.6-50 MG tablet Take 1 tablet by mouth 2 (two) times daily.   simvastatin (ZOCOR) 40 MG tablet TAKE ONE TABLET BY MOUTH EVERYDAY AT BEDTIME Needs appointment for further refills   tetrahydrozoline-zinc (VISINE-AC) 0.05-0.25 % ophthalmic solution Place 2 drops into both eyes 3 (three) times daily as needed (for itching ot redness).    thiamine 100 MG tablet Take 1 tablet (100 mg total) by mouth daily.   traMADol (ULTRAM)  50 MG tablet Take 1 tablet (50 mg total) by mouth every 6 (six) hours as needed for moderate pain.   No facility-administered encounter medications on file as of 02/09/2021.   Fill History: allopurinol 300 mg tablet 01/19/2021 30   donepezil 10 mg tablet 01/19/2021 30   ezetimibe 10 mg tablet 01/19/2021 30   metoprolol succinate ER 25 mg tablet,extended release 24 hr 01/19/2021 30   mirtazapine 15 mg tablet 01/19/2021 30   simvastatin 40 mg tablet 01/19/2021 30   Reviewed chart for medication changes ahead of medication coordination call.  No OVs, Consults, or hospital visits since last care coordination call/Pharmacist visit. (If appropriate, list visit date, provider name)  No medication changes indicated OR if recent visit, treatment plan here.  BP Readings from Last 3 Encounters:  12/14/20 122/60  12/07/20 107/69  09/10/20 99/63    Lab Results  Component Value Date   HGBA1C  09/13/2008    5.8 (NOTE)   The ADA recommends the following therapeutic goal for glycemic   control related to Hgb A1C measurement:   Goal of Therapy:   < 7.0% Hgb A1C   Reference: American Diabetes Association: Clinical Practice   Recommendations 2008, Diabetes Care,  2008, 31:(Suppl 1).     Patient obtains medications through Adherence Packaging  30 Days   Last adherence delivery included:  Aspirin 81 mg: one tablet at bedtime Allopurinol (ZYLOPRIM) 300 mg: one tablet at breakfast Donepezil (ARICEPT) 10 mg: one  tablet at breakfast Ezetimibe (ZETIA) 10 mg: one tablet at breakfast Metoprolol succinate (TOPROL-XL) 25 MG 24 hr: one tablet at breakfast Simvastatin (ZOCOR) 40 mg: one tablet at bedtime Mirtazapine (Remeron) 15 mg: one at bedtime Docusate 100 mg: one capsule at breakfast and one at bedtime  Patient declined (meds) last month due to PRN use/additional supply on hand. Fluticasone (CUTIVATE) 0.05 % cream Desoximetasone (TOPICORT) 0.25 % cream   Patient is due for next adherence  delivery on: 02/18/21. Called patient and reviewed medications and coordinated delivery.  This delivery to include: Aspirin 81 mg: one tablet at bedtime Allopurinol (ZYLOPRIM) 300 mg: one tablet at breakfast Donepezil (ARICEPT) 10 mg: one tablet at breakfast Ezetimibe (ZETIA) 10 mg: one tablet at breakfast Metoprolol succinate (TOPROL-XL) 25 MG 24 hr: one tablet at breakfast Simvastatin (ZOCOR) 40 mg: one tablet at bedtime Mirtazapine (Remeron) 15 mg: one at bedtime Docusate 100 mg: one capsule at breakfast and one at bedtime   Patient declined the following medications (meds) due to (reason) Fluticasone (CUTIVATE) 0.05 % cream Desoximetasone (TOPICORT) 0.25 % cream    Confirmed delivery date of 02/18/21, advised patient that pharmacy will contact them the morning of delivery.   Care Gaps:  AWV- message sent to [name] CMA to schedule. Zoster vaccines - never done COVID-19 vaccine booster 3 - overdue since 09/23/19 Tetanus/TDAP - overdue since 01/08/20 Colonoscopy - overdue since 05/06/20 Flu vaccine - due  Star Rating Drugs:  Simvastatin '40mg'$  - last filled on 01/19/21 30DS at [place] Pharmacist Assistant [phone]

## 2021-03-04 ENCOUNTER — Other Ambulatory Visit: Payer: Self-pay | Admitting: Family Medicine

## 2021-03-11 ENCOUNTER — Other Ambulatory Visit: Payer: Self-pay | Admitting: Cardiovascular Disease

## 2021-03-12 ENCOUNTER — Telehealth: Payer: Self-pay | Admitting: Pharmacist

## 2021-03-12 NOTE — Chronic Care Management (AMB) (Signed)
Chronic Care Management Pharmacy Assistant   Name: Jesus Jordan  MRN: [id number] DOB: 09/03/1945  Reason for Encounter: Medication Review/ Medication Coordination Call.    Recent office visits:  None.  Recent consult visits:  None.  Hospital visits:  None in previous 6 months  Medications: Outpatient Encounter Medications as of 03/12/2021  Medication Sig   acetaminophen (TYLENOL) 325 MG tablet Take 1-2 tablets (325-650 mg total) by mouth every 6 (six) hours as needed for mild pain (pain score 1-3 or temp > 100.5).   allopurinol (ZYLOPRIM) 300 MG tablet TAKE ONE TABLET BY MOUTH EVERY MORNING   donepezil (ARICEPT) 10 MG tablet TAKE ONE TABLET BY MOUTH EVERY MORNING   doxycycline (VIBRA-TABS) 100 MG tablet Take 100 mg by mouth daily. For rosacea/continuous   ezetimibe (ZETIA) 10 MG tablet Take 1 tablet (10 mg total) by mouth daily. PATIENT MUST KEEP APPOINTMENT FOR FUTURE REFILLS   folic acid (FOLVITE) 1 MG tablet Take 1 tablet (1 mg total) by mouth daily.   metoprolol succinate (TOPROL-XL) 25 MG 24 hr tablet Take 1 tablet (25 mg total) by mouth daily.   mirtazapine (REMERON) 15 MG tablet TAKE ONE TABLET BY MOUTH EVERYDAY AT BEDTIME   Multiple Vitamin (MULTIVITAMIN WITH MINERALS) TABS tablet Take 1 tablet by mouth daily.   ondansetron (ZOFRAN) 4 MG tablet Take 1 tablet (4 mg total) by mouth every 6 (six) hours as needed for nausea.   polyethylene glycol (MIRALAX / GLYCOLAX) 17 g packet Take 17 g by mouth daily as needed for mild constipation.   senna-docusate (SENOKOT-S) 8.6-50 MG tablet Take 1 tablet by mouth 2 (two) times daily.   simvastatin (ZOCOR) 40 MG tablet Take 1 tablet (40 mg total) by mouth daily at 6 PM. PATIENT MUST KEEP APPOINTMENT FOR FUTURE REFILLS   tetrahydrozoline-zinc (VISINE-AC) 0.05-0.25 % ophthalmic solution Place 2 drops into both eyes 3 (three) times daily as needed (for itching ot redness).    thiamine 100 MG tablet Take 1 tablet (100 mg total) by mouth  daily.   traMADol (ULTRAM) 50 MG tablet Take 1 tablet (50 mg total) by mouth every 6 (six) hours as needed for moderate pain.   No facility-administered encounter medications on file as of 03/12/2021.   Fill History: allopurinol 300 mg tablet 02/12/2021 30   donepezil 10 mg tablet 02/12/2021 30   ezetimibe 10 mg tablet 02/12/2021 30   metoprolol succinate ER 25 mg tablet,extended release 24 hr 02/12/2021 30   mirtazapine 15 mg tablet 02/12/2021 30   simvastatin 40 mg tablet 02/12/2021 30   Reviewed chart for medication changes ahead of medication coordination call.  No OVs, Consults, or hospital visits since last care coordination call/Pharmacist visit. (If appropriate, list visit date, provider name)  No medication changes indicated OR if recent visit, treatment plan here.  BP Readings from Last 3 Encounters:  12/14/20 122/60  12/07/20 107/69  09/10/20 99/63    Lab Results  Component Value Date   HGBA1C  09/13/2008    5.8 (NOTE)   The ADA recommends the following therapeutic goal for glycemic   control related to Hgb A1C measurement:   Goal of Therapy:   < 7.0% Hgb A1C   Reference: American Diabetes Association: Clinical Practice   Recommendations 2008, Diabetes Care,  2008, 31:(Suppl 1).     Patient obtains medications through Adherence Packaging  30 Days   Last adherence delivery included:  Aspirin 81 mg: one tablet at bedtime Allopurinol (ZYLOPRIM) 300 mg: one tablet  at breakfast Donepezil (ARICEPT) 10 mg: one tablet at breakfast Ezetimibe (ZETIA) 10 mg: one tablet at breakfast Metoprolol succinate (TOPROL-XL) 25 MG 24 hr: one tablet at breakfast Simvastatin (ZOCOR) 40 mg: one tablet at bedtime Mirtazapine (Remeron) 15 mg: one at bedtime Docusate 100 mg: one capsule at breakfast and one at bedtime    Patient declined (meds) last month due to PRN use/additional supply on hand. Fluticasone (CUTIVATE) 0.05 % cream Desoximetasone (TOPICORT) 0.25 % cream     Patient is due for next adherence delivery on: 03/22/2021. Called patient and reviewed medications and coordinated delivery.  This delivery to include: Aspirin 81 mg: one tablet at bedtime Allopurinol (ZYLOPRIM) 300 mg: one tablet at breakfast Donepezil (ARICEPT) 10 mg: one tablet at breakfast Ezetimibe (ZETIA) 10 mg: one tablet at breakfast Metoprolol succinate (TOPROL-XL) 25 MG 24 hr: one tablet at breakfast Simvastatin (ZOCOR) 40 mg: one tablet at bedtime Mirtazapine (Remeron) 15 mg: one at bedtime Docusate 100 mg: one capsule at breakfast and one at bedtime  Patient declined the following medications (meds) due to (reason) Fluticasone (CUTIVATE) 0.05 % cream Desoximetasone (TOPICORT) 0.25 % cream    Confirmed delivery date of 03/22/21, advised patient that pharmacy will contact them the morning of delivery.   Care Gaps:  AWV- message sent to [name] CMA to schedule. Zoster vaccines - never done COVID-19 vaccine booster 3 - overdue since 09/23/19 Tetanus/TDAP - overdue since 01/08/20 Colonoscopy - overdue since 05/06/20 Flu vaccine - due  Star Rating Drugs:  Simvastatin 40mg  - last filled on 02/12/21 30DS at [facility] Pharmacist Assistant [phone]

## 2021-03-17 ENCOUNTER — Telehealth: Payer: Self-pay

## 2021-03-17 NOTE — Telephone Encounter (Signed)
(  4:30 pm) SW left a message for patient's daughter [name] requesting a call back to schedule a visit.

## 2021-04-07 ENCOUNTER — Other Ambulatory Visit: Payer: Self-pay | Admitting: Family Medicine

## 2021-04-08 ENCOUNTER — Telehealth: Payer: Self-pay | Admitting: Pharmacist

## 2021-04-08 NOTE — Chronic Care Management (AMB) (Signed)
Chronic Care Management Pharmacy Assistant   Name: Jesus Jordan  MRN: [id number] DOB: 08-Sep-1945  Reason for Encounter: Disease State and Medication Review   Conditions to be addressed/monitored: HTN   Recent office visits:  None.  Recent consult visits:  None.  Hospital visits:  None in previous 6 months  Medications: Outpatient Encounter Medications as of 04/08/2021  Medication Sig   acetaminophen (TYLENOL) 325 MG tablet Take 1-2 tablets (325-650 mg total) by mouth every 6 (six) hours as needed for mild pain (pain score 1-3 or temp > 100.5).   allopurinol (ZYLOPRIM) 300 MG tablet TAKE ONE TABLET BY MOUTH EVERY MORNING   donepezil (ARICEPT) 10 MG tablet TAKE ONE TABLET BY MOUTH EVERY MORNING   doxycycline (VIBRA-TABS) 100 MG tablet Take 100 mg by mouth daily. For rosacea/continuous   ezetimibe (ZETIA) 10 MG tablet Take 1 tablet (10 mg total) by mouth daily. PATIENT MUST KEEP APPOINTMENT FOR FUTURE REFILLS   folic acid (FOLVITE) 1 MG tablet Take 1 tablet (1 mg total) by mouth daily.   metoprolol succinate (TOPROL-XL) 25 MG 24 hr tablet Take 1 tablet (25 mg total) by mouth daily.   mirtazapine (REMERON) 15 MG tablet TAKE ONE TABLET BY MOUTH EVERYDAY AT BEDTIME   Multiple Vitamin (MULTIVITAMIN WITH MINERALS) TABS tablet Take 1 tablet by mouth daily.   ondansetron (ZOFRAN) 4 MG tablet Take 1 tablet (4 mg total) by mouth every 6 (six) hours as needed for nausea.   polyethylene glycol (MIRALAX / GLYCOLAX) 17 g packet Take 17 g by mouth daily as needed for mild constipation.   senna-docusate (SENOKOT-S) 8.6-50 MG tablet Take 1 tablet by mouth 2 (two) times daily.   simvastatin (ZOCOR) 40 MG tablet Take 1 tablet (40 mg total) by mouth daily at 6 PM. PATIENT MUST KEEP APPOINTMENT FOR FUTURE REFILLS   tetrahydrozoline-zinc (VISINE-AC) 0.05-0.25 % ophthalmic solution Place 2 drops into both eyes 3 (three) times daily as needed (for itching ot redness).    thiamine 100 MG tablet Take 1  tablet (100 mg total) by mouth daily.   traMADol (ULTRAM) 50 MG tablet Take 1 tablet (50 mg total) by mouth every 6 (six) hours as needed for moderate pain.   No facility-administered encounter medications on file as of 04/08/2021.   Fill History:  allopurinol 300 mg tablet 03/16/2021 30   donepezil 10 mg tablet 03/16/2021 30   ezetimibe 10 mg tablet 03/16/2021 30   metoprolol succinate ER 25 mg tablet,extended release 24 hr 03/16/2021 30   mirtazapine 15 mg tablet 03/16/2021 30   simvastatin 40 mg tablet 03/16/2021 30   Reviewed chart prior to disease state call. Spoke with patient regarding BP  Recent Office Vitals: BP Readings from Last 3 Encounters:  12/14/20 122/60  12/07/20 107/69  09/10/20 99/63   Pulse Readings from Last 3 Encounters:  12/14/20 69  12/07/20 67  09/10/20 74    Wt Readings from Last 3 Encounters:  12/14/20 144 lb 8 oz (65.5 kg)  12/07/20 142 lb (64.4 kg)  09/02/20 175 lb 0.7 oz (79.4 kg)     Kidney Function Lab Results  Component Value Date/Time   CREATININE 0.96 09/09/2020 05:33 AM   CREATININE 0.85 09/07/2020 07:22 AM   CREATININE 0.90 02/10/2020 11:48 AM   CREATININE 1.07 03/18/2014 10:51 AM   GFR 95.90 07/10/2017 03:45 PM   GFRNONAA >60 09/09/2020 05:33 AM   GFRAA >60 08/28/2019 03:54 PM    BMP Latest Ref Rng & Units 09/09/2020 09/07/2020  09/06/2020  Glucose 70 - 99 mg/dL 99 103(H) 101(H)  BUN 8 - 23 mg/dL 25(H) 24(H) 29(H)  Creatinine 0.61 - 1.24 mg/dL 0.96 0.85 0.79  BUN/Creat Ratio 6 - 22 (calc) - - -  Sodium 135 - 145 mmol/L 135 136 138  Potassium 3.5 - 5.1 mmol/L 3.9 3.7 3.9  Chloride 98 - 111 mmol/L 103 102 104  CO2 22 - 32 mmol/L 26 26 23   Calcium 8.9 - 10.3 mg/dL 8.8(L) 9.1 9.0    Current antihypertensive regimen:  Metoprolol succinate 25mg  - take 1 tablet by mouth daily.  How often are you checking your Blood Pressure? infrequently Current home BP readings: None to report. Has not checked recently.  What recent  interventions/DTPs have been made by any provider to improve Blood Pressure control since last CPP Visit: None. Any recent hospitalizations or ED visits since last visit with CPP? No  Adherence Review: Is the patient currently on ACE/ARB medication? No Does the patient have >5 day gap between last estimated fill dates? No   Reviewed chart for medication changes ahead of medication coordination call.  No OVs, Consults, or hospital visits since last care coordination call/Pharmacist visit. (If appropriate, list visit date, provider name)  No medication changes indicated OR if recent visit, treatment plan here.  BP Readings from Last 3 Encounters:  12/14/20 122/60  12/07/20 107/69  09/10/20 99/63    Lab Results  Component Value Date   HGBA1C  09/13/2008    5.8 (NOTE)   The ADA recommends the following therapeutic goal for glycemic   control related to Hgb A1C measurement:   Goal of Therapy:   < 7.0% Hgb A1C   Reference: American Diabetes Association: Clinical Practice   Recommendations 2008, Diabetes Care,  2008, 31:(Suppl 1).     Patient obtains medications through Adherence Packaging  30 Days   Last adherence delivery included:  Aspirin 81 mg: one tablet at bedtime Allopurinol (ZYLOPRIM) 300 mg: one tablet at breakfast Donepezil (ARICEPT) 10 mg: one tablet at breakfast Ezetimibe (ZETIA) 10 mg: one tablet at breakfast Metoprolol succinate (TOPROL-XL) 25 MG 24 hr: one tablet at breakfast Simvastatin (ZOCOR) 40 mg: one tablet at bedtime Mirtazapine (Remeron) 15 mg: one at bedtime Docusate 100 mg: one capsule at breakfast and one at bedtime   Patient declined (meds) last month due to PRN use. Fluticasone (CUTIVATE) 0.05 % cream Desoximetasone (TOPICORT) 0.25 % cream    Patient is due for next adherence delivery on: 04/20/21. Called patient and reviewed medications and coordinated delivery.  This delivery to include: Aspirin 81 mg: one tablet at bedtime Allopurinol  (ZYLOPRIM) 300 mg: one tablet at breakfast Donepezil (ARICEPT) 10 mg: one tablet at breakfast Ezetimibe (ZETIA) 10 mg: one tablet at breakfast Metoprolol succinate (TOPROL-XL) 25 MG 24 hr: one tablet at breakfast Simvastatin (ZOCOR) 40 mg: one tablet at bedtime Mirtazapine (Remeron) 15 mg: one at bedtime Docusate 100 mg: one capsule at breakfast and one at bedtime   Patient declined the following medications due to PRN use.  Fluticasone (CUTIVATE) 0.05 % cream Desoximetasone (TOPICORT) 0.25 % cream    Confirmed delivery date of 04/20/21, advised patient that pharmacy will contact them the morning of delivery.    Notes: Spoke with patients daughter Jesus Jordan who states there are no changes to patients medications. They have not been checking blood pressure due to patients blood pressure running normal at appointments. Jesus Jordan stated that patient eats what he wants and gets activity around his home. Jesus Jordan thanked me  for my call.   Care Gaps:  AWV- message sent to [name] CMA to schedule. Zoster vaccines - never done COVID-19 vaccine booster 3 - overdue since 09/23/19 Tetanus/TDAP - overdue since 01/08/20 Colonoscopy - overdue since 05/06/20 Flu vaccine - due Last PCP BP: 122/60  Star Rating Drugs:  Simvastatin 40mg  - last filled on 03/16/21 30DS at [facility] Pharmacist Assistant [phone]

## 2021-04-26 ENCOUNTER — Encounter: Payer: Self-pay | Admitting: Family Medicine

## 2021-04-26 ENCOUNTER — Telehealth: Payer: Self-pay

## 2021-04-26 NOTE — Telephone Encounter (Signed)
(  4:15 pm)  SW scheduled follow-up visit with patient with patient's daughter-[name]. RN/SW team to follow-up with patient on 05/04/21 @ 10 am.

## 2021-05-05 ENCOUNTER — Other Ambulatory Visit: Payer: Medicare Other

## 2021-05-05 ENCOUNTER — Other Ambulatory Visit: Payer: Self-pay

## 2021-05-05 VITALS — BP 110/60 | HR 80 | Resp 18

## 2021-05-05 DIAGNOSIS — Z515 Encounter for palliative care: Secondary | ICD-10-CM

## 2021-05-07 ENCOUNTER — Other Ambulatory Visit: Payer: Self-pay | Admitting: Family Medicine

## 2021-05-07 ENCOUNTER — Other Ambulatory Visit: Payer: Self-pay

## 2021-05-07 NOTE — Progress Notes (Signed)
PATIENT NAME: Jihan Rudy DOB: 20-Jun-1946 MRN: [id number]  PRIMARY CARE PROVIDER: Eulas Post, MD  RESPONSIBLE PARTY:  Acct ID - Guarantor Home Phone Work Phone Relationship Acct Type  [id number] CROIX, PRESLEY [phone]  Self P/F     [address], [place], [place] [address]  COMMUNITY PALLIATIVE CARE RN NOTE    PLAN OF CARE and INTERVENTION:   ADVANCE CARE PLANNING/GOALS OF CARE: Safety, Full Code, comfort    DISEASE STATUS: Palliative care visit completed today. [facility] Met with daughter and patient in living room.  Patient sitting on sofa, awake and verbally engaging. Patient stating "I'm alright" when asked about pain or discomfort. Daughter shared that patient has had a few episodes when he left the home unattended, and she is concerned that his dementia is worsening. Daughter able to share that patient does not show aggression and is usually easy to redirect. Patient po intake continues to be good. SW discussed respite options and other community resources.   HISTORY OF PRESENT ILLNESS: This is a 75 year old male with PMH including but not limited to Dementia with alcohol abuse, COPD, Htn, and GERD. Palliative care has been asked to follow for additional support, goals of care and complex decision making.  CODE STATUS:  DNR, MOST  PPS: 50%    PHYSICAL EXAM:    Skin: Warm, Dry, no reported wounds/lesions   Lungs: Clear, occasional cough.    Cardiac: Cor Pulmonale: RRR Abdomen: BS positive. Neurological: Progressive cognitive decline         Engineer, maintenance (IT), BSN   VITALS: Today's Vitals   05/05/21 1339  BP: 110/60  Pulse: 80  Resp: 18  SpO2: 98%  PainSc: 0-No pain         Cornelius Moras, RN

## 2021-05-10 ENCOUNTER — Telehealth: Payer: Self-pay | Admitting: Pharmacist

## 2021-05-10 NOTE — Chronic Care Management (AMB) (Signed)
Chronic Care Management Pharmacy Assistant   Name: Jesus Jordan  MRN: [id number] DOB: April 29, 1946  Reason for Encounter: Medication Review / Medication Coordination call   Conditions to be addressed/monitored: HTN   Recent office visits:  None  Recent consult visits:  None  Hospital visits:  None  Medications: Outpatient Encounter Medications as of 05/10/2021  Medication Sig   acetaminophen (TYLENOL) 325 MG tablet Take 1-2 tablets (325-650 mg total) by mouth every 6 (six) hours as needed for mild pain (pain score 1-3 or temp > 100.5).   allopurinol (ZYLOPRIM) 300 MG tablet TAKE ONE TABLET BY MOUTH EVERY MORNING   donepezil (ARICEPT) 10 MG tablet TAKE ONE TABLET BY MOUTH EVERY MORNING   doxycycline (VIBRA-TABS) 100 MG tablet Take 100 mg by mouth daily. For rosacea/continuous   ezetimibe (ZETIA) 10 MG tablet Take 1 tablet (10 mg total) by mouth daily. PATIENT MUST KEEP APPOINTMENT FOR FUTURE REFILLS   folic acid (FOLVITE) 1 MG tablet Take 1 tablet (1 mg total) by mouth daily.   metoprolol succinate (TOPROL-XL) 25 MG 24 hr tablet TAKE ONE TABLET BY MOUTH EVERY MORNING   mirtazapine (REMERON) 15 MG tablet TAKE ONE TABLET BY MOUTH EVERYDAY AT BEDTIME   Multiple Vitamin (MULTIVITAMIN WITH MINERALS) TABS tablet Take 1 tablet by mouth daily.   ondansetron (ZOFRAN) 4 MG tablet Take 1 tablet (4 mg total) by mouth every 6 (six) hours as needed for nausea.   polyethylene glycol (MIRALAX / GLYCOLAX) 17 g packet Take 17 g by mouth daily as needed for mild constipation.   senna-docusate (SENOKOT-S) 8.6-50 MG tablet Take 1 tablet by mouth 2 (two) times daily.   simvastatin (ZOCOR) 40 MG tablet Take 1 tablet (40 mg total) by mouth daily at 6 PM. PATIENT MUST KEEP APPOINTMENT FOR FUTURE REFILLS   tetrahydrozoline-zinc (VISINE-AC) 0.05-0.25 % ophthalmic solution Place 2 drops into both eyes 3 (three) times daily as needed (for itching ot redness).    thiamine 100 MG tablet Take 1 tablet (100 mg  total) by mouth daily.   traMADol (ULTRAM) 50 MG tablet Take 1 tablet (50 mg total) by mouth every 6 (six) hours as needed for moderate pain.   No facility-administered encounter medications on file as of 05/10/2021.   Reviewed chart for medication changes ahead of medication coordination call.  No OVs, Consults, or hospital visits since last care coordination call/Pharmacist visit. (If appropriate, list visit date, provider name)  No medication changes indicated OR if recent visit, treatment plan here.  BP Readings from Last 3 Encounters:  05/05/21 110/60  12/14/20 122/60  12/07/20 107/69    Lab Results  Component Value Date   HGBA1C  09/13/2008    5.8 (NOTE)   The ADA recommends the following therapeutic goal for glycemic   control related to Hgb A1C measurement:   Goal of Therapy:   < 7.0% Hgb A1C   Reference: American Diabetes Association: Clinical Practice   Recommendations 2008, Diabetes Care,  2008, 31:(Suppl 1).     Patient obtains medications through Adherence Packaging  30 Days   Last adherence delivery included:  delivery to include: Aspirin 81 mg: one tablet at bedtime Allopurinol (ZYLOPRIM) 300 mg: one tablet at breakfast Donepezil (ARICEPT) 10 mg: one tablet at breakfast Ezetimibe (ZETIA) 10 mg: one tablet at breakfast Metoprolol succinate (TOPROL-XL) 25 MG 24 hr: one tablet at breakfast Simvastatin (ZOCOR) 40 mg: one tablet at bedtime Mirtazapine (Remeron) 15 mg: one at bedtime Docusate 100 mg: one capsule at breakfast  and one at bedtime  Patient declined (meds) last month: Fluticasone (CUTIVATE) 0.05 % cream Desoximetasone (TOPICORT) 0.25 % cream PRN use  Patient is due for next adherence delivery on: 05/20/2021  Called patient and reviewed medications and coordinated delivery.  This delivery to include: Aspirin 81 mg: one tablet at bedtime Allopurinol (ZYLOPRIM) 300 mg: one tablet at breakfast Donepezil (ARICEPT) 10 mg: one tablet at  breakfast Ezetimibe (ZETIA) 10 mg: one tablet at breakfast Metoprolol succinate (TOPROL-XL) 25 MG 24 hr: one tablet at breakfast Simvastatin (ZOCOR) 40 mg: one tablet at bedtime Mirtazapine (Remeron) 15 mg: one at bedtime Docusate 100 mg: one capsule at breakfast and one at bedtime  Patient will need a short fill: None  Coordinated acute fill:None  Patient declined the following medications:  Fluticasone (CUTIVATE) 0.05 % cream Desoximetasone (TOPICORT) 0.25 % cream PRN use  Confirmed delivery date of 05/20/2021, advised patient that pharmacy will contact them the morning of delivery.  Care Gaps: AWV- message sent to [name] CMA to schedule. Zoster vaccines - never done COVID-19 vaccine - overdue  Tetanus/TDAP - overdue  Colonoscopy - overdue  Flu vaccine - due Last PCP BP: 122/60  Star Rating Drugs: Simvastatin 40mg  - last filled on 04/20/2021 30DS at [facility] Pharmacist Assistant [phone]

## 2021-05-15 NOTE — Progress Notes (Signed)
COMMUNITY PALLIATIVE CARE SW NOTE  PATIENT NAME: Jesus Jordan DOB: 11-20-1945 MRN: [id number]  PRIMARY CARE PROVIDER: Eulas Post, MD  RESPONSIBLE PARTY:  Acct ID - Guarantor Home Phone Work Phone Relationship Acct Type  [id number] LONNELL, CHAPUT [phone]  Self P/F     [place], [place], [place] [address]     PLAN OF CARE and INTERVENTIONS:             GOALS OF CARE/ ADVANCE CARE PLANNING:  Goal is for patient to remain at home. Patient is a DNR.  SOCIAL/EMOTIONAL/SPIRITUAL ASSESSMENT/ INTERVENTIONS:  SW and RN-B. Senor completed a follow-up visit with patient at his home, where he was present with his daughter. Patient was observed sitting on his sofa, awake, alert and engaged with the team. There have been two incidents in the past month where law enforcement was contacted because patient wondered away from home. Patient was found without incident and patient was registered with law enforcement if this should happen again. Patient's appetite is good. His sleep pattern varies. Patient ambulates independently and is independent for ADL's with cueing and reminders. However , overall, his cognitive state is declining. Patient indicated that he is not in any pain or discomfort. His daughter expressed that she is overwhelmed and fatigue with caring for patient. SW normalized and validated her feelings. SW provided resources for day programs, facilities that provide respite care and interventions to provide more safety in the home. PCG expressed appreciation for the support. Patient/PCG remain open to ongoing palliative care/SW support.  PATIENT/CAREGIVER EDUCATION/ COPING:  PCG is coping adequately. SW encouraged her to follow-up on the resources provided. Patient appears to be coping well and has supportive friends that visit and take him out for lunch and other social activities from time-to-time.  PERSONAL EMERGENCY PLAN:  Interventions for additional support and safety. 911 can be activated  for emergencies.  COMMUNITY RESOURCES COORDINATION/ HEALTH CARE NAVIGATION:  SW provided resources for  day programs and facilities that provide respite care.  FINANCIAL/LEGAL CONCERNS/INTERVENTIONS:  None.     SOCIAL HX:  Social History   Tobacco Use   Smoking status: Some Days    Years: 30.00    Types: Cigarettes   Smokeless tobacco: Never   Tobacco comments:    skip days some; pack last 3 days   Substance Use Topics   Alcohol use: Yes    Alcohol/week: 42.0 standard drinks    Types: 42 Cans of beer per week    Comment: 6 pack per day    CODE STATUS: Fill Code ADVANCED DIRECTIVES: No MOST FORM COMPLETE:  Yes HOSPICE EDUCATION PROVIDED: No  PPS: Patient is alert and oriented to self. He is independent for ADL's with cueing and encouragement.    Duration of visit and documentation: 60 minutes.   [address] [place], [place]

## 2021-05-26 DIAGNOSIS — Z23 Encounter for immunization: Secondary | ICD-10-CM | POA: Diagnosis not present

## 2021-05-27 DIAGNOSIS — Z20822 Contact with and (suspected) exposure to covid-19: Secondary | ICD-10-CM | POA: Diagnosis not present

## 2021-06-02 ENCOUNTER — Telehealth: Payer: Self-pay | Admitting: Physician Assistant

## 2021-06-02 ENCOUNTER — Other Ambulatory Visit: Payer: Self-pay

## 2021-06-02 ENCOUNTER — Ambulatory Visit (INDEPENDENT_AMBULATORY_CARE_PROVIDER_SITE_OTHER): Payer: Medicare Other | Admitting: Physician Assistant

## 2021-06-02 VITALS — BP 115/71 | HR 64 | Ht 69.0 in | Wt 153.2 lb

## 2021-06-02 DIAGNOSIS — F02818 Dementia in other diseases classified elsewhere, unspecified severity, with other behavioral disturbance: Secondary | ICD-10-CM | POA: Diagnosis not present

## 2021-06-02 DIAGNOSIS — G629 Polyneuropathy, unspecified: Secondary | ICD-10-CM

## 2021-06-02 DIAGNOSIS — I2510 Atherosclerotic heart disease of native coronary artery without angina pectoris: Secondary | ICD-10-CM

## 2021-06-02 DIAGNOSIS — G301 Alzheimer's disease with late onset: Secondary | ICD-10-CM | POA: Diagnosis not present

## 2021-06-02 NOTE — Patient Instructions (Signed)
Good to meet you!   Continue Aricept 10mg  every morning and see if this helps with the vivid dreams  2. Restart Thiamine 100mg  daily, continue daily folic acid, mirtazapine  3. Continue to monitor driving  4.nerve conduction study  R forearm   4. Follow-up in 6 months, call for any changes   FALL PRECAUTIONS: Be cautious when walking. Scan the area for obstacles that may increase the risk of trips and falls. When getting up in the mornings, sit up at the edge of the bed for a few minutes before getting out of bed. Consider elevating the bed at the head end to avoid drop of blood pressure when getting up. Walk always in a well-lit room (use night lights in the walls). Avoid area rugs or power cords from appliances in the middle of the walkways. Use a walker or a cane if necessary and consider physical therapy for balance exercise. Get your eyesight checked regularly.   HOME SAFETY: Consider the safety of the kitchen when operating appliances like stoves, microwave oven, and blender. Consider having supervision and share cooking responsibilities until no longer able to participate in those. Accidents with firearms and other hazards in the house should be identified and addressed as well.  DRIVING: Regarding driving, in patients with progressive memory problems, driving will be impaired. We advise to have someone else do the driving if trouble finding directions or if minor accidents are reported. Independent driving assessment is available to determine safety of driving.  ABILITY TO BE LEFT ALONE: If patient is unable to contact 911 operator, consider using LifeLine, or when the need is there, arrange for someone to stay with patients. Smoking is a fire hazard, consider supervision or cessation. Risk of wandering should be assessed by caregiver and if detected at any point, supervision and safe proof recommendations should be instituted.   RECOMMENDATIONS FOR ALL PATIENTS WITH MEMORY  PROBLEMS: 1. Continue to exercise (Recommend 30 minutes of walking everyday, or 3 hours every week) 2. Increase social interactions - continue going to [place] and enjoy social gatherings with friends and family 3. Eat healthy, avoid fried foods and eat more fruits and vegetables 4. Maintain adequate blood pressure, blood sugar, and blood cholesterol level. Reducing the risk of stroke and cardiovascular disease also helps promoting better memory. 5. Avoid stressful situations. Live a simple life and avoid aggravations. Organize your time and prepare for the next day in anticipation. 6. Sleep well, avoid any interruptions of sleep and avoid any distractions in the bedroom that may interfere with adequate sleep quality 7. Avoid sugar, avoid sweets as there is a strong link between excessive sugar intake, diabetes, and cognitive impairment We discussed the Mediterranean diet, which has been shown to help patients reduce the risk of progressive memory disorders and reduces cardiovascular risk. This includes eating fish, eat fruits and green leafy vegetables, nuts like almonds and hazelnuts, walnuts, and also use olive oil. Avoid fast foods and fried foods as much as possible. Avoid sweets and sugar as sugar use has been linked to worsening of memory function.  There is always a concern of gradual progression of memory problems. If this is the case, then we may need to adjust level of care according to patient needs. Support, both to the patient and caregiver, should then be put into place.       Mediterranean Diet  Why follow it? Research shows Those who follow the Mediterranean diet have a reduced risk of heart disease  The diet  is associated with a reduced incidence of Parkinson's and Alzheimer's diseases People following the diet may have longer life expectancies and lower rates of chronic diseases  The Dietary Guidelines for Americans recommends the Mediterranean diet as an eating plan to promote  health and prevent disease  What Is the Mediterranean Diet?  Healthy eating plan based on typical foods and recipes of Mediterranean-style cooking The diet is primarily a plant based diet; these foods should make up a majority of meals   Starches - Plant based foods should make up a majority of meals - They are an important sources of vitamins, minerals, energy, antioxidants, and fiber - Choose whole grains, foods high in fiber and minimally processed items  - Typical grain sources include wheat, oats, barley, corn, brown rice, bulgar, farro, millet, polenta, couscous  - Various types of beans include chickpeas, lentils, fava beans, black beans, white beans   Fruits  Veggies - Large quantities of antioxidant rich fruits & veggies; 6 or more servings  - Vegetables can be eaten raw or lightly drizzled with oil and cooked  - Vegetables common to the traditional Mediterranean Diet include: artichokes, arugula, beets, broccoli, brussel sprouts, cabbage, carrots, celery, collard greens, cucumbers, eggplant, kale, leeks, lemons, lettuce, mushrooms, okra, onions, peas, peppers, potatoes, pumpkin, radishes, rutabaga, shallots, spinach, sweet potatoes, turnips, zucchini - Fruits common to the Mediterranean Diet include: apples, apricots, avocados, cherries, clementines, dates, figs, grapefruits, grapes, melons, nectarines, oranges, peaches, pears, pomegranates, strawberries, tangerines  Fats - Replace butter and margarine with healthy oils, such as olive oil, canola oil, and tahini  - Limit nuts to no more than a handful a day  - Nuts include walnuts, almonds, pecans, pistachios, pine nuts  - Limit or avoid candied, honey roasted or heavily salted nuts - Olives are central to the [employer] - can be eaten whole or used in a variety of dishes   Meats Protein - Limiting red meat: no more than a few times a month - When eating red meat: choose lean cuts and keep the portion to the size of deck of  cards - Eggs: approx. 0 to 4 times a week  - Fish and lean poultry: at least 2 a week  - Healthy protein sources include, chicken, [place], lean beef, lamb - Increase intake of seafood such as tuna, salmon, trout, mackerel, shrimp, scallops - Avoid or limit high fat processed meats such as sausage and bacon  Dairy - Include moderate amounts of low fat dairy products  - Focus on healthy dairy such as fat free yogurt, skim milk, low or reduced fat cheese - Limit dairy products higher in fat such as whole or 2% milk, cheese, ice cream  Alcohol - Moderate amounts of red wine is ok  - No more than 5 oz daily for women (all ages) and men older than age 67  - No more than 10 oz of wine daily for men younger than 47  Other - Limit sweets and other desserts  - Use herbs and spices instead of salt to flavor foods  - Herbs and spices common to the traditional Mediterranean Diet include: basil, bay leaves, chives, cloves, cumin, fennel, garlic, lavender, marjoram, mint, oregano, parsley, pepper, rosemary, sage, savory, sumac, tarragon, thyme   Its not just a diet, its a lifestyle:  The Mediterranean diet includes lifestyle factors typical of those in the region  Foods, drinks and meals are best eaten with others and savored Daily physical activity is important for overall  good health This could be strenuous exercise like running and aerobics This could also be more leisurely activities such as walking, housework, yard-work, or taking the stairs Moderation is the key; a balanced and healthy diet accommodates most foods and drinks Consider portion sizes and frequency of consumption of certain foods   Meal Ideas & Options:  Breakfast:  Whole wheat toast or whole wheat English muffins with peanut butter & hard boiled egg Steel cut oats topped with apples & cinnamon and skim milk  Fresh fruit: banana, strawberries, melon, berries, peaches  Smoothies: strawberries, bananas, greek yogurt, peanut  butter Low fat greek yogurt with blueberries and granola  Egg white omelet with spinach and mushrooms Breakfast couscous: whole wheat couscous, apricots, skim milk, cranberries  Sandwiches:  Hummus and grilled vegetables (peppers, zucchini, squash) on whole wheat bread   Grilled chicken on whole wheat pita with lettuce, tomatoes, cucumbers or tzatziki  Jordan salad on whole wheat bread: tuna salad made with greek yogurt, olives, red peppers, capers, green onions Garlic rosemary lamb pita: lamb sauted with garlic, rosemary, salt & pepper; add lettuce, cucumber, greek yogurt to pita - flavor with lemon juice and black pepper  Seafood:  Mediterranean grilled salmon, seasoned with garlic, basil, parsley, lemon juice and black pepper Shrimp, lemon, and spinach whole-grain pasta salad made with low fat greek yogurt  Seared scallops with lemon orzo  Seared tuna steaks seasoned salt, pepper, coriander topped with tomato mixture of olives, tomatoes, olive oil, minced garlic, parsley, green onions and cappers  Meats:  Herbed greek chicken salad with kalamata olives, cucumber, feta  Red bell peppers stuffed with spinach, bulgur, lean ground beef (or lentils) & topped with feta   Kebabs: skewers of chicken, tomatoes, onions, zucchini, squash  [place] burgers: made with red onions, mint, dill, lemon juice, feta cheese topped with roasted red peppers Vegetarian Cucumber salad: cucumbers, artichoke hearts, celery, red onion, feta cheese, tossed in olive oil & lemon juice  Hummus and whole grain pita points with a greek salad (lettuce, tomato, feta, olives, cucumbers, red onion) Lentil soup with celery, carrots made with vegetable broth, garlic, salt and pepper  Tabouli salad: parsley, bulgur, mint, scallions, cucumbers, tomato, radishes, lemon juice, olive oil, salt and pepper.

## 2021-06-02 NOTE — Progress Notes (Signed)
Assessment/Plan:     Late onset Alzheimer's disease with behavioral disturbance   Discussed safety both in and out of the home.  Discussed the importance of regular daily schedule with inclusion of crossword puzzles to maintain brain function.  Continue to monitor mood by PCP Stay active at least 30 minutes at least 3 times a week.  Naps should be scheduled and should be no longer than 60 minutes and should not occur after 2 PM.  Mediterranean diet is recommended  Continue donepezil 10 mg daily Side effects were discussed Follow up in  6 months. NCS/EMG for evaluation of median nerve neuropathy   Case discussed with Dr. Delice Lesch who agrees with the plan     Subjective:    Jesus Jordan is a very pleasant 75 y.o. RH male with a history of hypertension, hyperlipidemia, CAD, COPD, alcohol abuse, presenting for evaluation of dementia. This patient is accompanied in the office by his daughter who supplements the history.  Previous records as well as any outside records available were reviewed prior to todays visit.  Patient was last seen at our office on 12/07/2020.  Brain MRI showed mild to moderate diffuse atrophy, no acute changes. Patient is currently on donepezil 10 mg daily now in the morning due to agitation tolerating it better.  The patient's daughter states that the "memory tends to get some sundowners ".  However, the memory is about the same.  He continues to hide things in the morning, then telling the family that someone stole them.  She began putting air tags and all the important things.  For example, the other day he lost his wallet, who he believed people took it but she was able to find it because of these [employer].  He has visual hallucinations.  He sees people in the house, his brother Jesus Jordan who is deceased, and "a little girl who talks to me ".  "Sometimes I talk about ".  He continues to repeat the same stories and assessing questions.  He only drives short distances and his  daughter put her dog in the car, and him, he drives with GPS only.Marland Kitchen  His mood is good, denies any depression.  His sleep is good, he has vivid dreams and some REM behavior but not worse than before.  He denies sleepwalking.  No paranoia.  There are no hygiene concerns, he is independent of bathing and dressing.  His medications are in a pillbox, given by his daughter.  They are also in charge of the finances for him.  His appetite is good, denies trouble swallowing.  He does not cook.  He ambulates without difficulty, he only had 1 recent fall in the middle of the night due to loose socks.  He hit his head, but had no concussion.  He denies headaches, double vision, dizziness, focal weakness.  He has some numbness in the same area described back in March of this year, day median nerve region, which is bothersome to him, he is able to move his hand however.  He denies any urine incontinence, retention, constipation or diarrhea.  He had COVID in early November, with no residual.   HISTORY OF PRESENT ILLNESS 12/07/2020: This is a pleasant 75 year old right-handed man with a history of hypertension, hyperlipidemia, CAD, COPD, alcohol abuse, presenting for evaluation of dementia. He underwent Neuropsychological evaluation in 05/2020 indicating Major Neurocognitive Disorder (ie dementia). Etiology difficult to determine, largely due to fairly diffuse nature of cognitive impairment. Alzheimer's disease is the  most common form of neurodegenerative illness, and his profile does share many characteristics of this pattern. Lewy body dementia should remain on the differential, due to frontal subcortical dysfunction with pronounced visuospatial deficits, reports of potential visual hallucinations, and REM sleep behaviors. He also has a large number of cardiovascular ailments in his history with likely vascular contributions, making a "mixed dementia" presentation very likely. I personally reviewed MRI brain without contrast  done 06/2020 which did not show any chronic microvascular disease. There was mild to moderate diffuse atrophy, no acute changes. He has been on Donepezil 10mg  qhs.    He feels his memory is "kind of screwed up a bit." Jesus Jordan had been living in [place] and started noticing changes at least a year ago, however COVID had made it hard to see him more often initially. When she visited, she noted a lot of confusion, standing and staring. He was drinking a 6-pack beer daily. She took over finances in November 2021 as she noticed bills were getting overwhelming, he was losing them/putting them down somewhere he could not find. When Preston visited, she discovered he was not taking his regular medications. He continues to drive short distances in the daytime and denies getting lost, Jesus Jordan denies any issues with local driving. She moved in to stay with him in February 2022 and has been managing medications.She noted visual hallucinations in August 2021, thinking people were in the house or having a conversation with someone. This has quieted down, he still has both delusions that someone/family was there, occurring around once a week. No paranoia. When he takes his medications consistently and on a routine, he does much better. He is independent with dressing and bathing. He is able to make his breakfast and has not left the stove on, no difficulties using the microwave. Sleep is overall good, sometimes he yells in his sleep or she sees him acting out his dreams. He sometimes wakes up almost in a cold sweat thinking he needs to do something due to vivid dreams. He is on mirtazapine 15mg  qhs, Jesus Jordan notes that things are worse when he does not take it at night. There is a family history of dementia in his maternal grandparents and 2 sisters. No history of significant head injuries. They report he has definitely cut down on beer intake to one at night, alternating with coke.   He denies any headaches, dizziness, vision changes,  neck/back pain, bladder dysfunction, anosmia, or tremors. He has some constipation. He has numbness in the right hand in a median nerve distribution. He had a fall while playing tennis in March, sustaining a hip fracture s/p fixation. He has been recovering well and ambulates without assistance. No further falls.    Labs on 09/04/2020 shows TSH 0.402, B12 324 PREVIOUS MEDICATIONS:   CURRENT MEDICATIONS:  Outpatient Encounter Medications as of 06/02/2021  Medication Sig   acetaminophen (TYLENOL) 325 MG tablet Take 1-2 tablets (325-650 mg total) by mouth every 6 (six) hours as needed for mild pain (pain score 1-3 or temp > 100.5).   allopurinol (ZYLOPRIM) 300 MG tablet TAKE ONE TABLET BY MOUTH EVERY MORNING   donepezil (ARICEPT) 10 MG tablet TAKE ONE TABLET BY MOUTH EVERY MORNING   doxycycline (VIBRA-TABS) 100 MG tablet Take 100 mg by mouth daily. For rosacea/continuous   ezetimibe (ZETIA) 10 MG tablet Take 1 tablet (10 mg total) by mouth daily. PATIENT MUST KEEP APPOINTMENT FOR FUTURE REFILLS   folic acid (FOLVITE) 1 MG tablet Take 1 tablet (1  mg total) by mouth daily.   metoprolol succinate (TOPROL-XL) 25 MG 24 hr tablet TAKE ONE TABLET BY MOUTH EVERY MORNING   mirtazapine (REMERON) 15 MG tablet TAKE ONE TABLET BY MOUTH EVERYDAY AT BEDTIME   Multiple Vitamin (MULTIVITAMIN WITH MINERALS) TABS tablet Take 1 tablet by mouth daily.   ondansetron (ZOFRAN) 4 MG tablet Take 1 tablet (4 mg total) by mouth every 6 (six) hours as needed for nausea.   polyethylene glycol (MIRALAX / GLYCOLAX) 17 g packet Take 17 g by mouth daily as needed for mild constipation.   senna-docusate (SENOKOT-S) 8.6-50 MG tablet Take 1 tablet by mouth 2 (two) times daily.   simvastatin (ZOCOR) 40 MG tablet Take 1 tablet (40 mg total) by mouth daily at 6 PM. PATIENT MUST KEEP APPOINTMENT FOR FUTURE REFILLS   tetrahydrozoline-zinc (VISINE-AC) 0.05-0.25 % ophthalmic solution Place 2 drops into both eyes 3 (three) times daily as  needed (for itching ot redness).    thiamine 100 MG tablet Take 1 tablet (100 mg total) by mouth daily.   traMADol (ULTRAM) 50 MG tablet Take 1 tablet (50 mg total) by mouth every 6 (six) hours as needed for moderate pain.   No facility-administered encounter medications on file as of 06/02/2021.     Objective:     PHYSICAL EXAMINATION:    VITALS:   Vitals:   06/02/21 1313  BP: 115/71  Pulse: 64  SpO2: 99%  Weight: 153 lb 3.2 oz (69.5 kg)  Height: 5\' 9"  (1.753 m)    GEN:  The patient appears stated age and is in NAD. HEENT:  Normocephalic, atraumatic.   Neurological examination:  General: NAD, well-groomed, appears stated age. Orientation: The patient is alert. Oriented to person, place and not to date Cranial nerves: There is good facial symmetry.The speech is fluent and clear. No aphasia or dysarthria. Fund of knowledge is appropriate. Recent and remote memory are impaired. Attention and concentration are reduced.  Able to name objects and repeat phrases.  Hearing is intact to conversational tone.    Sensation: Sensation is intact to light touch except at the R UE median nerve region. The 5th digit is spared.  Motor: Strength is at least antigravity x4. Tremors: none  DTR's 2/4 in UE/LE    No flowsheet data found. MMSE - Mini Mental State Exam 09/05/2017  Not completed: (No Data)    No flowsheet data found.     Movement examination: Tone: There is normal tone in the UE/LE Abnormal movements:  no tremor.  No myoclonus.  No asterixis.   Coordination:  There is no decremation with RAM's. Normal finger to nose  Gait and Station: The patient has no difficulty arising out of a deep-seated chair without the use of the hands. The patient's stride length is good.  Gait is cautious and narrow.        Total time spent on today's visit was 30 minutes, including both face-to-face time and nonface-to-face time. Time included that spent on review of records (prior notes  available to me/labs/imaging if pertinent), discussing treatment and goals, answering patient's questions and coordinating care.  Cc:  Eulas Post, MD [name], PA-C

## 2021-06-02 NOTE — Telephone Encounter (Signed)
Called and left a message to call back to schedule an EMG RUE.

## 2021-06-03 ENCOUNTER — Other Ambulatory Visit: Payer: Self-pay | Admitting: Family Medicine

## 2021-06-03 ENCOUNTER — Other Ambulatory Visit: Payer: Self-pay | Admitting: Cardiology

## 2021-06-03 NOTE — Telephone Encounter (Signed)
Left a message to call back to schedule EMG

## 2021-06-04 ENCOUNTER — Telehealth: Payer: Self-pay | Admitting: Pharmacist

## 2021-06-04 ENCOUNTER — Encounter: Payer: Self-pay | Admitting: *Deleted

## 2021-06-04 NOTE — Chronic Care Management (AMB) (Signed)
Chronic Care Management Pharmacy Assistant   Name: Jesus Jordan  MRN: [id number] DOB: [birth year]  Reason for Encounter: Medication Review / Medication Coordination Call   Conditions to be addressed/monitored: HLD  Recent office visits:  None  Recent consult visits:  06/02/2021 Jesus Butters PA-C (neurology) - Patient was seen for Neuropathy and an additional issue. Restart Thiamine 100mg  daily. Follow up in 6 months.  Hospital visits:  None  Medications: Outpatient Encounter Medications as of 06/04/2021  Medication Sig   acetaminophen (TYLENOL) 325 MG tablet Take 1-2 tablets (325-650 mg total) by mouth every 6 (six) hours as needed for mild pain (pain score 1-3 or temp > 100.5).   allopurinol (ZYLOPRIM) 300 MG tablet TAKE ONE TABLET BY MOUTH EVERY MORNING   donepezil (ARICEPT) 10 MG tablet TAKE ONE TABLET BY MOUTH EVERY MORNING   doxycycline (VIBRA-TABS) 100 MG tablet Take 100 mg by mouth daily. For rosacea/continuous   ezetimibe (ZETIA) 10 MG tablet TAKE ONE TABLET BY MOUTH ONCE DAILY   folic acid (FOLVITE) 1 MG tablet Take 1 tablet (1 mg total) by mouth daily.   metoprolol succinate (TOPROL-XL) 25 MG 24 hr tablet TAKE ONE TABLET BY MOUTH EVERY MORNING   mirtazapine (REMERON) 15 MG tablet TAKE ONE TABLET BY MOUTH EVERYDAY AT BEDTIME   Multiple Vitamin (MULTIVITAMIN WITH MINERALS) TABS tablet Take 1 tablet by mouth daily.   ondansetron (ZOFRAN) 4 MG tablet Take 1 tablet (4 mg total) by mouth every 6 (six) hours as needed for nausea.   polyethylene glycol (MIRALAX / GLYCOLAX) 17 g packet Take 17 g by mouth daily as needed for mild constipation.   senna-docusate (SENOKOT-S) 8.6-50 MG tablet Take 1 tablet by mouth 2 (two) times daily.   simvastatin (ZOCOR) 40 MG tablet TAKE ONE TABLET BY MOUTH EVERYDAY AT BEDTIME   tetrahydrozoline-zinc (VISINE-AC) 0.05-0.25 % ophthalmic solution Place 2 drops into both eyes 3 (three) times daily as needed (for itching ot redness).    thiamine 100  MG tablet Take 1 tablet (100 mg total) by mouth daily.   traMADol (ULTRAM) 50 MG tablet Take 1 tablet (50 mg total) by mouth every 6 (six) hours as needed for moderate pain.   No facility-administered encounter medications on file as of 06/04/2021.   Patient obtains medications through Adherence Packaging  30 Days    Last adherence delivery included:  Aspirin 81 mg: one tablet at bedtime Allopurinol (ZYLOPRIM) 300 mg: one tablet at breakfast Donepezil (ARICEPT) 10 mg: one tablet at breakfast Ezetimibe (ZETIA) 10 mg: one tablet at breakfast Metoprolol succinate (TOPROL-XL) 25 MG 24 hr: one tablet at breakfast Simvastatin (ZOCOR) 40 mg: one tablet at bedtime Mirtazapine (Remeron) 15 mg: one at bedtime Docusate 100 mg: one capsule at breakfast and one at bedtime     Patient declined (meds) last month due to PRN use. Fluticasone (CUTIVATE) 0.05 % cream Desoximetasone (TOPICORT) 0.25 % cream     Patient is due for next adherence delivery on: 06/18/2021  Called patient and reviewed medications with patient daughter and coordinated delivery.   This delivery to include: Aspirin 81 mg: one tablet at bedtime Allopurinol (ZYLOPRIM) 300 mg: one tablet at breakfast Donepezil (ARICEPT) 10 mg: one tablet at breakfast Ezetimibe (ZETIA) 10 mg: one tablet at breakfast Metoprolol succinate (TOPROL-XL) 25 MG 24 hr: one tablet at breakfast Simvastatin (ZOCOR) 40 mg: one tablet at bedtime Mirtazapine (Remeron) 15 mg: one at bedtime Docusate 100 mg: one capsule at breakfast and one at bedtime  Patient declined the following medications due to PRN use.   Fluticasone (CUTIVATE) 0.05 % cream  Desoximetasone (TOPICORT) 0.25 % cream   Confirmed delivery date of 06/18/2021, advised patients daughter that pharmacy will contact them the morning of delivery.     Care Gaps: AWV- message sent to [name] CMA to schedule. Zoster vaccines - never done COVID-19 vaccine booster 3 - overdue since  09/23/19 Tetanus/TDAP - overdue since 01/08/20 Colonoscopy - overdue since 05/06/20 Flu vaccine - due Last PCP BP: 122/60  Star Rating Drugs: Simvastatin 40mg  - last filled on 05/14/2021 30DS at [facility] [phone]

## 2021-06-04 NOTE — Telephone Encounter (Signed)
This encounter was created in error - please disregard.

## 2021-06-04 NOTE — Progress Notes (Signed)
error 

## 2021-06-15 ENCOUNTER — Ambulatory Visit: Payer: Medicare Other | Admitting: Family Medicine

## 2021-06-17 ENCOUNTER — Other Ambulatory Visit: Payer: Self-pay

## 2021-06-17 ENCOUNTER — Other Ambulatory Visit: Payer: Medicare Other

## 2021-06-17 DIAGNOSIS — Z515 Encounter for palliative care: Secondary | ICD-10-CM

## 2021-06-18 NOTE — Progress Notes (Signed)
COMMUNITY PALLIATIVE CARE SW NOTE  PATIENT NAME: Jesus Jordan DOB: [birth year] MRN: [id number]  PRIMARY CARE PROVIDER: Eulas Post, MD  RESPONSIBLE PARTY:  Acct ID - Guarantor Home Phone Work Phone Relationship Acct Type  [id number] Jesus Jordan, Jesus Jordan [phone]  Self P/F     [place], [place], [place] [address]   Due to the COVID-19, this visit was done via telephone from my office and it was initiated and consent by this patient and/or family.    PLAN OF CARE and INTERVENTIONS:             GOALS OF CARE/ ADVANCE CARE PLANNING:  Goal is for patient to remain at home. Patient is a DNR. SOCIAL/EMOTIONAL/SPIRITUAL ASSESSMENT/ INTERVENTIONS:  SW completed a telephonic virtual visit with patient's daughter-[name]. [name] provided a status update on patient. She advised that overall patient has doing fine. He remains very cognitively confused. They try to keep him as busy as possible. [name] advised that her brother is currently visiting a few weeks which gives her a break. Patient has not attempted to leave or wander away from the home. He is signed up to go to [employer] and has attended a few times. [name] advised that patient has had difficulty adjusting to the program so she pulled him out for the holidays, but will start him back slowly. He continues to eat well. He remains relatively independent for performing ADL's, but needs cueing and direction. He continues to ambulate independently. He continues to sleep 1/2 hours, on and off through the day/night. [name] verbalized no other concerns. She remains open to ongoing visits/support of the palliative care team.  PATIENT/CAREGIVER EDUCATION/ COPING:  Patient and PCG appear to be coping well. They both have supportive family and friends.  PERSONAL EMERGENCY PLAN:  911 can be activated for emergencies. COMMUNITY RESOURCES COORDINATION/ HEALTH CARE NAVIGATION:  Patient is to return to [place]. FINANCIAL/LEGAL CONCERNS/INTERVENTIONS:   None.     SOCIAL HX:  Social History   Tobacco Use   Smoking status: Some Days    Years: 30.00    Types: Cigarettes   Smokeless tobacco: Never   Tobacco comments:    skip days some; pack last 3 days   Substance Use Topics   Alcohol use: Yes    Alcohol/week: 42.0 standard drinks    Types: 42 Cans of beer per week    Comment: 6 pack per day    CODE STATUS: Full Code ADVANCED DIRECTIVES: No MOST FORM COMPLETE:  Yes HOSPICE EDUCATION PROVIDED: No  PPS: Patient is alert and oriented to self. Patient ambulates independently. He is independent for ADL's with cueing and encouragement.      Duration of telephonic visit and documentation: 30 minutes.      [address] [place], [place]

## 2021-06-22 ENCOUNTER — Other Ambulatory Visit: Payer: Self-pay

## 2021-06-22 ENCOUNTER — Encounter: Payer: Self-pay | Admitting: Cardiovascular Disease

## 2021-06-22 ENCOUNTER — Ambulatory Visit (INDEPENDENT_AMBULATORY_CARE_PROVIDER_SITE_OTHER): Payer: Medicare Other | Admitting: Cardiovascular Disease

## 2021-06-22 VITALS — BP 98/59 | HR 71 | Ht 70.0 in | Wt 155.6 lb

## 2021-06-22 DIAGNOSIS — E785 Hyperlipidemia, unspecified: Secondary | ICD-10-CM

## 2021-06-22 DIAGNOSIS — F02A18 Dementia in other diseases classified elsewhere, mild, with other behavioral disturbance: Secondary | ICD-10-CM | POA: Diagnosis not present

## 2021-06-22 DIAGNOSIS — I2510 Atherosclerotic heart disease of native coronary artery without angina pectoris: Secondary | ICD-10-CM

## 2021-06-22 DIAGNOSIS — F32A Depression, unspecified: Secondary | ICD-10-CM

## 2021-06-22 DIAGNOSIS — G301 Alzheimer's disease with late onset: Secondary | ICD-10-CM

## 2021-06-22 NOTE — Progress Notes (Signed)
Patient ID: Jesus Jordan, male   DOB: October 03, 1945, 76 y.o.   MRN: [id number]    PCP: Dr. [name]   HPI: Jesus Jordan is a 76 y.o. male  who presents to the office today for a 46 month cardiology evaluation.  Jesus Jordan suffered a large inferolateral myocardial infarction on 09/12/2008 and presented to [facility] with recurrent episodes of ventricular fibrillation and required numerous defibrillator shocks along with CPR.  I was able to successfully perform intervention to a totally occluded proximal left circumflex coronary artery. This was stented and more distally he had diffuse disease with joint and underwent PTCA at multiple sites in the very distal aspect of this dominant circumflex system. His RCA  was diffusely diseased with stenoses of 90% and was nondominant. His LAD was normal. He had undergone an initial stress test in one year showed an area of scar and inferolateral wall at the mid to basal region. He subsequently was taken off his Effient and then switched to Plavix. A nuclear perfusion study in September 2012 was unchanged from previously and showed the small moderate region of the mid to basal inferior inferolateral scar without associated ischemia.  On 03/04/2014 a three-year followup nuclear perfusion study continued to be low risk and only showed a small fixed inferior defect without reversible ischemia.  He had normal wall motion.  Ejection fraction was 64%.  I saw him in March 2019 at which time he had developed a right inguinal hernia and was in need for surgical repair.  He had previously undergone successful repair of a left inguinal hernia by Dr. Lucia Gaskins.  When I saw him, he was stable without anginal symptomatology or palpitations but had noticed some mild episodes of dizziness.  I recommended he reduce lisinopril from 10 mg to 5 mg.  He underwent right inguinal hernia surgery successfully without cardiovascular compromise.  His dizziness has improved but at times he still  notes rare episodes.  Of note, since suffering his myocardial infarction in March 2010 he has lost 75 pounds.    I last saw him in November 2019 and at that time he was slightly bradycardic.  I suggested slight reduction of carvedilol to 12.5 mg in the morning and 6.2 5 at night.  Dr. Elease Jordan has been checking laboratory and he was on Zetia 10 mg and simvastatin 40 mg.  I suggested that if his LDL cholesterol was greater than 70 it would be advisable to switch to either atorvastatin or rosuvastatin rather than simvastatin.  He  had been doing fairly well but in June 2020 re-presented to the emergency room with complaints of chest pain.  Troponin was negative.  He subsequently was referred for an outpatient Myoview study on December 18, 2018 which showed an EF of 67% and small inferior wall infarct from apex to base with minimal peri-infarction ischemia but was felt to be a low risk study.  His echo Doppler study showed an EF of 55 to 60% with mild to moderate TR.  He was evaluated by Almyra Deforest, PAC on December 20, 2018 his blood pressure was low.  He has continued to play doubles tennis without exertional chest pain.  He was off isosorbide due to his low blood pressure and this was not restarted at his follow-up visit with Almyra Deforest, Jesus Jordan.    I  saw him in November 2020 at which time he felt improved.  His blood pressure remained low.  He denied recurrent chest pain symptomatology or shortness of  breath but if he stood up very quickly he did note some very transient lightheadedness.   Jesus Jordan was recently evaluated by Dr. Davina Poke on August 28, 2019 after he presented complaining of intermittent left-sided chest pain over the past 10 days.  Iaw him in follow-up on September 10, 2019.  His chest pain did not appear to be ischemic in etiology, but the patient was extremely concerned.  His ECG was normal.  He was very stressed and felt depressed.  Ultimately it was advised that he go to the emergency room for laboratory.   Troponins were negative.  It was felt to be noncardiac etiology.  During that evaluation, blood pressure remained stable without orthostatic change on carvedilol 12.5 mg twice a day.  He continues to be on DAPT with aspirin/Plavix and was on Zetia and simvastatin 40 mg for hyperlipidemia.  Since my last evaluation, he has undergone neurologic evaluation for late onset Alzheimer's disease and most recently had seen Jesus Collum, Jesus Jordan at [facility] neurology.  A brain MRI showed mild to moderate diffuse atrophy without acute changes and he was currently on Aricept 10 mg.  He denies any anginal symptoms.  He denies any shortness of breath.  He still plays occasional tennis at [employer].  He has experience of numbness of his right hand and fingers.  Aware of palpitations.  He denies presyncope or syncope.  He presents for evaluation.  Past Medical History:  Diagnosis Date   Alcohol abuse, daily use    05/29/20 - Reported consuming a 6 pack per day   CAD in native artery 05/03/2016   COPD (chronic obstructive pulmonary disease) 12/10/2018   Coronary atherosclerosis 01/29/2009   Essential hypertension 12/10/2018   GERD (gastroesophageal reflux disease) 04/18/2018   Gout    Hearing loss    History of MI (myocardial infarction) 09/12/2008   large inferolateral MI secondary to total occlusion of circumflex with VF cardiac arrest   History of nuclear stress test 02/23/2012   exercise myoview; area of scar in inferolateral wall at mid-basal region   Hyperlipidemia 03/06/2013   target LDL less than 70   Inguinal hernia 05/25/2009   Major neurocognitive disorder due to multiple etiologies without behavioral disturbance 05/29/2020    Past Surgical History:  Procedure Laterality Date   CORONARY STENT PLACEMENT  09/12/2008   r/t MI; 3.5x54m Xience DES to circumflex & PTCA at multiple sites in distal region of vessel; alos diffusely diseaase, narrowed, nondominant RCA   FEMUR IM NAIL Right 09/03/2020    Procedure: INTRAMEDULLARY (IM) NAIL FEMORAL;  Surgeon: SRod Can MD;  Location: [facility];  Service: Orthopedics;  Laterality: Right;   FOOT SURGERY  1960   HERNIA REPAIR  2011   left inguinal hernia   TRANSTHORACIC ECHOCARDIOGRAM  03/02/2009   EOF=75-10% LV systolic function borderline reduced with mild inferior wall hypokinesis; mild-mod MR; mild TR; AV mildly sclerotic; mild aortic root dilatation    Allergies  Allergen Reactions   Imdur [Isosorbide Nitrate] Other (See Comments)    Caused vertigo   Lisinopril Other (See Comments)    Possible vertigo??    Current Outpatient Medications  Medication Sig Dispense Refill   acetaminophen (TYLENOL) 325 MG tablet Take 1-2 tablets (325-650 mg total) by mouth every 6 (six) hours as needed for mild pain (pain score 1-3 or temp > 100.5). 100 tablet 0   allopurinol (ZYLOPRIM) 300 MG tablet TAKE ONE TABLET BY MOUTH EVERY MORNING 30 tablet 1   donepezil (ARICEPT) 10 MG  tablet TAKE ONE TABLET BY MOUTH EVERY MORNING 90 tablet 2   doxycycline (VIBRA-TABS) 100 MG tablet Take 100 mg by mouth daily. For rosacea/continuous     ezetimibe (ZETIA) 10 MG tablet TAKE ONE TABLET BY MOUTH ONCE DAILY 90 tablet 0   folic acid (FOLVITE) 1 MG tablet Take 1 tablet (1 mg total) by mouth daily.     metoprolol succinate (TOPROL-XL) 25 MG 24 hr tablet TAKE ONE TABLET BY MOUTH EVERY MORNING 330 tablet 0   mirtazapine (REMERON) 15 MG tablet TAKE ONE TABLET BY MOUTH EVERYDAY AT BEDTIME 30 tablet 1   Multiple Vitamin (MULTIVITAMIN WITH MINERALS) TABS tablet Take 1 tablet by mouth daily.     ondansetron (ZOFRAN) 4 MG tablet Take 1 tablet (4 mg total) by mouth every 6 (six) hours as needed for nausea. 20 tablet 0   polyethylene glycol (MIRALAX / GLYCOLAX) 17 g packet Take 17 g by mouth daily as needed for mild constipation. 14 each 0   senna-docusate (SENOKOT-S) 8.6-50 MG tablet Take 1 tablet by mouth 2 (two) times daily.     simvastatin (ZOCOR) 40 MG tablet TAKE ONE TABLET  BY MOUTH EVERYDAY AT BEDTIME 90 tablet 0   tetrahydrozoline-zinc (VISINE-AC) 0.05-0.25 % ophthalmic solution Place 2 drops into both eyes 3 (three) times daily as needed (for itching ot redness).      thiamine 100 MG tablet Take 1 tablet (100 mg total) by mouth daily.     traMADol (ULTRAM) 50 MG tablet Take 1 tablet (50 mg total) by mouth every 6 (six) hours as needed for moderate pain. 30 tablet 0   No current facility-administered medications for this visit.    Social History   Socioeconomic History   Marital status: Single    Spouse name: Not on file   Number of children: 3   Years of education: 12   Highest education level: High school graduate  Occupational History   Occupation: Retired    Comment: Development worker, international aid  Tobacco Use   Smoking status: Some Days    Years: 30.00    Types: Cigarettes   Smokeless tobacco: Never   Tobacco comments:    skip days some; pack last 3 days   Vaping Use   Vaping Use: Never used  Substance and Sexual Activity   Alcohol use: Yes    Alcohol/week: 42.0 standard drinks    Types: 42 Cans of beer per week    Comment: 6 pack per day   Drug use: No   Sexual activity: Not on file  Other Topics Concern   Not on file  Social History Narrative   Right handed   Drinks caffeine    One story home   Social Determinants of Health   Financial Resource Strain: Not on file  Food Insecurity: Not on file  Transportation Needs: Not on file  Physical Activity: Not on file  Stress: Not on file  Social Connections: Not on file  Intimate Partner Violence: Not on file   Social history is notable that he is divorced.  His daughter is now working in [employer].  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  There is no present tobacco use.  Family History  Problem Relation Age of Onset   Colon cancer Father    Alcoholism Father    Hyperlipidemia Brother    Heart disease Brother 57       massive MI   Raynaud syndrome  Brother    Cancer Paternal  Grandfather        colon   Dementia Sister        Unspecified type   Socially he is divorced. Has 3 children and 2 grandchildren. No tobacco use. He does drink occasional alcohol. He does play tennis.  His daughter, Jesus Jordan is working at [employer] in [employer] in [employer].  ROS General: Negative; No fevers, chills, or night sweats;  HEENT: Negative; No changes in vision or hearing, sinus congestion, difficulty swallowing Pulmonary: Negative; No cough, wheezing, shortness of breath, hemoptysis Cardiovascular: Negative; No chest pain, presyncope, syncope, palpitations GI: Negative; No nausea, vomiting, diarrhea, or abdominal pain GU: Positive for  Bilateral inguinal hernia surgeries;  No dysuria, hematuria, or difficulty voiding Musculoskeletal: Negative; no myalgias, joint pain, or weakness He has a history of gout, but denies any recurrence. Hematologic/Oncology: Negative; no easy bruising, bleeding Endocrine: Negative; no heat/cold intolerance; no diabetes Neuro: Late onset Alzheimer's dementia Skin: Negative; No rashes or skin lesions Psychiatric: Negative; No behavioral problems, depression Sleep: Negative; No snoring, daytime sleepiness, hypersomnolence, bruxism, restless legs, hypnogognic hallucinations, no cataplexy Other comprehensive 14 point system review is negative.  PE BP (!) 98/59    Pulse 71    Ht '5\' 10"'  (1.778 m)    Wt 155 lb 9.6 oz (70.6 kg)    SpO2 98%    BMI 22.33 kg/m    Repeat blood pressure by me was 106/64 supine and 98/60 standing  Wt Readings from Last 3 Encounters:  06/22/21 155 lb 9.6 oz (70.6 kg)  06/02/21 153 lb 3.2 oz (69.5 kg)  12/14/20 144 lb 8 oz (65.5 kg)    General: Alert, oriented, no distress.  More frail in appearance compared to his prior evaluation Skin: normal turgor, no rashes, warm and dry HEENT: Normocephalic, atraumatic. Pupils equal round and reactive to light; sclera anicteric; extraocular muscles intact;   Nose without nasal septal hypertrophy Mouth/Parynx benign; Mallinpatti scale 2 Neck: No JVD, no carotid bruits; normal carotid upstroke Lungs: clear to ausculatation and percussion; no wheezing or rales Chest wall: without tenderness to palpitation Heart: PMI not displaced, RRR, s1 s2 normal, 1/6 systolic murmur, no diastolic murmur, no rubs, gallops, thrills, or heaves Abdomen: soft, nontender; no hepatosplenomehaly, BS+; abdominal aorta nontender and not dilated by palpation. Back: no CVA tenderness Pulses 2+ Musculoskeletal: full range of motion, normal strength, no joint deformities Extremities: no clubbing cyanosis or edema, Homan's sign negative  Neurologic: grossly nonfocal; Cranial nerves grossly wnl Psychologic: Normal mood and affect   June 22, 2021 ECG (independently read by me): Sinus rhythm at 71; 1st degree AV block; PR 216 msec; mild T wave abnormality in III  March 2021 ECG (independently read by me): Sinus bradycardia at 58 bpm, first-degree AV block with a parable of 230 ms.  No ectopy.  August 2020 ECG (independently read by me): Normal sinus rhythm at 62 bpm; first-degree AV block with PR interval 222 ms.  No ectopy.  QTc interval 414 ms  November 2019 ECG (independently read by me): Sinus bradycardia 57 bpm.  Borderline first-degree AV block with appeared normal at 206 ms.  March 2019 ECG (independently read by me): sinus bradycardia 58 bpm.  Borderline first-degree AV blo  No ST segment abnormalities.  November 2017 ECG (independently read by me): Normal sinus rhythm at 61 bpm.  Borderline first-degree AV block with a PR interval of 210 ms.  QTc interval normal at 390 ms.  November 2016 ECG (independently read by me): Normal sinus rhythm at  61 bpm.  No ectopy.  Normal intervals.  No ECG evidence of prior myocardial infarction.  September 2015 ECG (independently read by me): Sinus bradycardia 57 beats per minute.  Borderline first-degree AV block with PR interval  of 204 ms.  Prior ECG: Sinus rhythm at 63 beats per minute. Normal intervals.  LABS:  BMP Latest Ref Rng & Units 09/09/2020 09/07/2020 09/06/2020  Glucose 70 - 99 mg/dL 99 103(H) 101(H)  BUN 8 - 23 mg/dL 25(H) 24(H) 29(H)  Creatinine 0.61 - 1.24 mg/dL 0.96 0.85 0.79  BUN/Creat Ratio 6 - 22 (calc) - - -  Sodium 135 - 145 mmol/L 135 136 138  Potassium 3.5 - 5.1 mmol/L 3.9 3.7 3.9  Chloride 98 - 111 mmol/L 103 102 104  CO2 22 - 32 mmol/L '26 26 23  ' Calcium 8.9 - 10.3 mg/dL 8.8(L) 9.1 9.0   Hepatic Function Latest Ref Rng & Units 12/14/2020 09/02/2020 02/10/2020  Total Protein 6.0 - 8.3 g/dL 7.0 7.1 6.9  Albumin 3.5 - 5.2 g/dL 4.4 4.1 -  AST 0 - 37 U/L '29 28 28  ' ALT 0 - 53 U/L '24 20 21  ' Alk Phosphatase 39 - 117 U/L 80 64 -  Total Bilirubin 0.2 - 1.2 mg/dL 0.4 1.3(H) 0.8  Bilirubin, Direct 0.0 - 0.3 mg/dL 0.1 - 0.2   CBC Latest Ref Rng & Units 09/09/2020 09/07/2020 09/06/2020  WBC 4.0 - 10.5 K/uL 7.9 6.9 8.6  Hemoglobin 13.0 - 17.0 g/dL 10.9(L) 12.2(L) 12.0(L)  Hematocrit 39.0 - 52.0 % 33.7(L) 37.2(L) 36.1(L)  Platelets 150 - 400 K/uL 174 144(L) 141(L)   Lab Results  Component Value Date   MCV 101.2 (H) 09/09/2020   MCV 99.5 09/07/2020   MCV 99.7 09/06/2020   Lab Results  Component Value Date   TSH 0.402 09/04/2020   Lipid Panel     Component Value Date/Time   CHOL 135 12/14/2020 1422   CHOL 142 03/06/2013 1039   TRIG 138.0 12/14/2020 1422   TRIG 74 03/06/2013 1039   HDL 62.70 12/14/2020 1422   HDL 73 03/06/2013 1039   CHOLHDL 2 12/14/2020 1422   VLDL 27.6 12/14/2020 1422   LDLCALC 45 12/14/2020 1422   LDLCALC 49 02/10/2020 1148   LDLCALC 54 03/06/2013 1039   RADIOLOGY: No results found.   IMPRESSION: 1. Coronary artery disease involving native coronary artery of native heart without angina pectoris   2. Hyperlipidemia LDL goal <70   3. Depression, unspecified depression type   4. Mild late onset Alzheimer's dementia with other behavioral disturbance [facility])      ASSESSMENT AND PLAN: Jesus Jordan is a 76 year-old Caucasian male who suffered a VF cardiac arrest secondary to total proximal occlusion of his of a dominant left circumflex artery artery on 09/12/2008.  He has been demonstrated to have significant myocardial salvage in the lateral wall but does have mid to basal inferior to inferolateral scar. His  nuclear perfusion study in 2015  did not reveal any wall motion abnormality and his ejection fraction was 64% without evidence for ischemia.  He was evaluated in the emergency room in June 2020 pain was not felt to be ischemic and possibly anxiety mediated.   Troponins were negative.  Subsequent nuclear study was unchanged from previously and was low risk with previous demonstration of inferior scar.  A subsequent echo Doppler study reveals an EF of 55 to 60% and he had normal diastolic parameters.  There was mild to moderate TR.  He has had  issues with orthostatic hypotension resulting in reduction in his medical regimen.  When seen by me in November 2020 his blood pressure was low and with his normal systolic and diastolic function and some orthostatic dizziness I recommended discontinuance of lisinopril.  He has not had any recurrent episodes of chest pain.  His blood pressure today on repeat by me was 106/64 supine and 98/60 standing.  His resting pulse is 71 on metoprolol succinate 25 mg daily.  Lipid studies have been aggressively treated with Zetia 10 mg and simvastatin 40 mg.  Dr. [name] will be checking his laboratory.  He continues to be on Aricept in addition to mirtazapine.     Troy Sine, MD, [facility]  06/23/2021 5:50 PM

## 2021-06-22 NOTE — Patient Instructions (Signed)
Medication Instructions:  ?The current medical regimen is effective;  continue present plan and medications as directed. Please refer to the Current Medication list given to you today.  ? ?*If you need a refill on your cardiac medications before your next appointment, please call your pharmacy* ? ?Lab Work:   Testing/Procedures:  ?NONE    NONE ? ?Follow-Up: ?Your next appointment:  12 month(s) In Person with Thomas Kelly, MD    ? ?Please call our office 2 months in advance to schedule this appointment  ? ?At [facility], you and your health needs are our priority.  As part of our continuing mission to provide you with exceptional heart care, we have created designated Provider Care Teams.  These Care Teams include your primary Cardiologist (physician) and Advanced Practice Providers (APPs -  Physician Assistants and Nurse Practitioners) who all work together to provide you with the care you need, when you need it. ? ? ?

## 2021-06-23 ENCOUNTER — Encounter: Payer: Self-pay | Admitting: Cardiovascular Disease

## 2021-07-06 ENCOUNTER — Other Ambulatory Visit: Payer: Self-pay

## 2021-07-06 ENCOUNTER — Ambulatory Visit (INDEPENDENT_AMBULATORY_CARE_PROVIDER_SITE_OTHER): Payer: Medicare Other | Admitting: Neurology

## 2021-07-06 DIAGNOSIS — G629 Polyneuropathy, unspecified: Secondary | ICD-10-CM | POA: Diagnosis not present

## 2021-07-06 DIAGNOSIS — G5601 Carpal tunnel syndrome, right upper limb: Secondary | ICD-10-CM

## 2021-07-06 NOTE — Procedures (Signed)
[facility] Neurology  [place]  [place], [place] [address] Tel: [phone] Fax:  [phone] Test Date:  07/06/2021  Patient: Jesus Jordan DOB: 1946-05-15 Physician: Narda Amber, DO  Sex: Male Height: 5\' 10"  Ref Phys: [name], PA-C  ID#: [id number]   Technician:    Patient Complaints: This is a 76 year old man referred for evaluation of right hand numbness and tingling.  NCV & EMG Findings: Extensive electrodiagnostic testing of the right upper and lower extremity shows:  Right median sensory response is absent.  Right ulnar sensory responses within normal limits. Right median motor response is absent.  Right ulnar motor responses within normal limits. Chronic motor axonal loss changes are seen affecting the right abductor pollicis brevis muscle, without accompanying active denervation.  Impression: Right median neuropathy at or distal to the wrist, consistent with a clinical diagnosis of carpal tunnel syndrome.  Overall, these findings are very severe in degree electrically.   ___________________________ Narda Amber, DO    Nerve Conduction Studies Anti Sensory Summary Table   Stim Site NR Peak (ms) Norm Peak (ms) P-T Amp (V) Norm P-T Amp  Right Median Anti Sensory (2nd Digit)  32C  Wrist NR  <3.8  >10  Right Ulnar Anti Sensory (5th Digit)  32C  Wrist    3.1 <3.2 18.8 >5   Motor Summary Table   Stim Site NR Onset (ms) Norm Onset (ms) O-P Amp (mV) Norm O-P Amp Site1 Site2 Delta-0 (ms) Dist (cm) Vel (m/s) Norm Vel (m/s)  Right Median Motor (Abd Poll Brev)  32C  Wrist NR  <4.0  >5 Elbow Wrist  0.0  >50  Elbow NR            Right Ulnar Motor (Abd Dig Minimi)  32C  Wrist    2.7 <3.1 10.1 >7 B Elbow Wrist 3.7 22.0 59 >50  B Elbow    6.4  9.3  A Elbow B Elbow 1.6 10.0 63 >50  A Elbow    8.0  8.6          EMG   Side Muscle Ins Act Fibs Psw Fasc Number Recrt Dur Dur. Amp Amp. Poly Poly. Comment  Right 1stDorInt Nml Nml Nml Nml Nml Nml Nml Nml  Nml Nml Nml Nml N/A  Right PronatorTeres Nml Nml Nml Nml Nml Nml Nml Nml Nml Nml Nml Nml N/A  Right Abd Poll Brev Nml Nml Nml Nml SMU Rapid All 1+ All 1+ All 1+ N/A  Right Biceps Nml Nml Nml Nml Nml Nml Nml Nml Nml Nml Nml Nml N/A  Right Triceps Nml Nml Nml Nml Nml Nml Nml Nml Nml Nml Nml Nml N/A  Right Deltoid Nml Nml Nml Nml Nml Nml Nml Nml Nml Nml Nml Nml N/A      Waveforms:

## 2021-07-08 ENCOUNTER — Telehealth: Payer: Self-pay | Admitting: Pharmacist

## 2021-07-08 NOTE — Chronic Care Management (AMB) (Signed)
Chronic Care Management Pharmacy Assistant   Name: Jesus Jordan  MRN: [id number] DOB: 1946/05/29  Reason for Encounter: Medication Review / Medication Coordination Call   Conditions to be addressed/monitored: HTN  Recent office visits:  None  Recent consult visits:  06/22/2021 Jesus Majestic MD (cardiology) - Patient was seen for Coronary artery disease involving native coronary artery of native heart without angina pectoris and additional issues. No medication changes. Follow up in 12 months.   Hospital visits:  None  Medications: Outpatient Encounter Medications as of 07/08/2021  Medication Sig   acetaminophen (TYLENOL) 325 MG tablet Take 1-2 tablets (325-650 mg total) by mouth every 6 (six) hours as needed for mild pain (pain score 1-3 or temp > 100.5).   allopurinol (ZYLOPRIM) 300 MG tablet TAKE ONE TABLET BY MOUTH EVERY MORNING   donepezil (ARICEPT) 10 MG tablet TAKE ONE TABLET BY MOUTH EVERY MORNING   doxycycline (VIBRA-TABS) 100 MG tablet Take 100 mg by mouth daily. For rosacea/continuous   ezetimibe (ZETIA) 10 MG tablet TAKE ONE TABLET BY MOUTH ONCE DAILY   folic acid (FOLVITE) 1 MG tablet Take 1 tablet (1 mg total) by mouth daily.   metoprolol succinate (TOPROL-XL) 25 MG 24 hr tablet TAKE ONE TABLET BY MOUTH EVERY MORNING   mirtazapine (REMERON) 15 MG tablet TAKE ONE TABLET BY MOUTH EVERYDAY AT BEDTIME   Multiple Vitamin (MULTIVITAMIN WITH MINERALS) TABS tablet Take 1 tablet by mouth daily.   ondansetron (ZOFRAN) 4 MG tablet Take 1 tablet (4 mg total) by mouth every 6 (six) hours as needed for nausea.   polyethylene glycol (MIRALAX / GLYCOLAX) 17 g packet Take 17 g by mouth daily as needed for mild constipation.   senna-docusate (SENOKOT-S) 8.6-50 MG tablet Take 1 tablet by mouth 2 (two) times daily.   simvastatin (ZOCOR) 40 MG tablet TAKE ONE TABLET BY MOUTH EVERYDAY AT BEDTIME   tetrahydrozoline-zinc (VISINE-AC) 0.05-0.25 % ophthalmic solution Place 2 drops into both  eyes 3 (three) times daily as needed (for itching ot redness).    thiamine 100 MG tablet Take 1 tablet (100 mg total) by mouth daily.   traMADol (ULTRAM) 50 MG tablet Take 1 tablet (50 mg total) by mouth every 6 (six) hours as needed for moderate pain.   No facility-administered encounter medications on file as of 07/08/2021.   Reviewed chart for medication changes ahead of medication coordination call.  No OVs, Consults, or hospital visits since last care coordination call/Pharmacist visit. (If appropriate, list visit date, provider name)  No medication changes indicated OR if recent visit, treatment plan here.  BP Readings from Last 3 Encounters:  06/22/21 (!) 98/59  06/02/21 115/71  05/05/21 110/60    Lab Results  Component Value Date   HGBA1C  09/13/2008    5.8 (NOTE)   The ADA recommends the following therapeutic goal for glycemic   control related to Hgb A1C measurement:   Goal of Therapy:   < 7.0% Hgb A1C   Reference: American Diabetes Association: Clinical Practice   Recommendations 2008, Diabetes Care,  2008, 31:(Suppl 1).     Patient obtains medications through Adherence Packaging  30 Days    Last adherence delivery included:  Aspirin 81 mg: one tablet at bedtime Allopurinol (ZYLOPRIM) 300 mg: one tablet at breakfast Donepezil (ARICEPT) 10 mg: one tablet at breakfast Ezetimibe (ZETIA) 10 mg: one tablet at breakfast Metoprolol succinate (TOPROL-XL) 25 MG 24 hr: one tablet at breakfast Simvastatin (ZOCOR) 40 mg: one tablet at bedtime Mirtazapine (Remeron)  15 mg: one at bedtime Docusate 100 mg: one capsule at breakfast and one at bedtime     Patient declined (meds) last month due to PRN use. Fluticasone (CUTIVATE) 0.05 % cream Desoximetasone (TOPICORT) 0.25 % cream     Patient is due for next adherence delivery on: 07/20/2021   Called patient and reviewed medications with patients daughter Jesus Jordan and coordinated delivery.   This delivery to include: Aspirin 81 mg:  one tablet at bedtime Allopurinol (ZYLOPRIM) 300 mg: one tablet at breakfast Donepezil (ARICEPT) 10 mg: one tablet at breakfast Ezetimibe (ZETIA) 10 mg: one tablet at breakfast Metoprolol succinate (TOPROL-XL) 25 MG 24 hr: one tablet at breakfast Simvastatin (ZOCOR) 40 mg: one tablet at bedtime Mirtazapine (Remeron) 15 mg: one at bedtime Docusate 100 mg: one capsule at breakfast and one at bedtime     Patient declined the following medications due to PRN use.   Fluticasone (CUTIVATE) 0.05 % cream  Desoximetasone (TOPICORT) 0.25 % cream   Confirmed delivery date of 07/20/2021, advised patients daughter Jesus Jordan that pharmacy will contact them the morning of delivery.   Care Gaps: AWV- message sent to Jesus Jordan CMA to schedule. Last BP - 98/59 on 06/22/2021 Zoster vaccines - never done COVID-19 vaccine booster 3 - overdue since 09/23/19 Tetanus/TDAP - overdue since 01/08/20 Colonoscopy - overdue since 05/06/20  Star Rating Drugs: Simvastatin 40mg  - last filled on 06/10/2021 30DS at [facility] [phone]

## 2021-08-05 ENCOUNTER — Other Ambulatory Visit: Payer: Self-pay | Admitting: Family Medicine

## 2021-08-06 ENCOUNTER — Telehealth: Payer: Self-pay | Admitting: Pharmacist

## 2021-08-06 NOTE — Chronic Care Management (AMB) (Addendum)
Chronic Care Management Pharmacy Assistant   Name: Jesus Jordan  MRN: [id number] DOB: [birth year]  Reason for Encounter: Medication Review / Medication Coordination Call   Conditions to be addressed/monitored: HTN  Recent office visits:  None   Recent consult visits:  None  Hospital visits:  None  Medications: Outpatient Encounter Medications as of 08/06/2021  Medication Sig   acetaminophen (TYLENOL) 325 MG tablet Take 1-2 tablets (325-650 mg total) by mouth every 6 (six) hours as needed for mild pain (pain score 1-3 or temp > 100.5).   allopurinol (ZYLOPRIM) 300 MG tablet TAKE ONE TABLET BY MOUTH EVERY MORNING   donepezil (ARICEPT) 10 MG tablet TAKE ONE TABLET BY MOUTH EVERY MORNING   doxycycline (VIBRA-TABS) 100 MG tablet Take 100 mg by mouth daily. For rosacea/continuous   ezetimibe (ZETIA) 10 MG tablet TAKE ONE TABLET BY MOUTH ONCE DAILY   folic acid (FOLVITE) 1 MG tablet Take 1 tablet (1 mg total) by mouth daily.   metoprolol succinate (TOPROL-XL) 25 MG 24 hr tablet TAKE ONE TABLET BY MOUTH EVERY MORNING   mirtazapine (REMERON) 15 MG tablet TAKE ONE TABLET BY MOUTH EVERYDAY AT BEDTIME   Multiple Vitamin (MULTIVITAMIN WITH MINERALS) TABS tablet Take 1 tablet by mouth daily.   ondansetron (ZOFRAN) 4 MG tablet Take 1 tablet (4 mg total) by mouth every 6 (six) hours as needed for nausea.   polyethylene glycol (MIRALAX / GLYCOLAX) 17 g packet Take 17 g by mouth daily as needed for mild constipation.   senna-docusate (SENOKOT-S) 8.6-50 MG tablet Take 1 tablet by mouth 2 (two) times daily.   simvastatin (ZOCOR) 40 MG tablet TAKE ONE TABLET BY MOUTH EVERYDAY AT BEDTIME   tetrahydrozoline-zinc (VISINE-AC) 0.05-0.25 % ophthalmic solution Place 2 drops into both eyes 3 (three) times daily as needed (for itching ot redness).    thiamine 100 MG tablet Take 1 tablet (100 mg total) by mouth daily.   traMADol (ULTRAM) 50 MG tablet Take 1 tablet (50 mg total) by mouth every 6 (six) hours as  needed for moderate pain.   No facility-administered encounter medications on file as of 08/06/2021.  Reviewed chart for medication changes ahead of medication coordination call.  No OVs, Consults, or hospital visits since last care coordination call/Pharmacist visit. (If appropriate, list visit date, provider name)  No medication changes indicated OR if recent visit, treatment plan here.  BP Readings from Last 3 Encounters:  06/22/21 (!) 98/59  06/02/21 115/71  05/05/21 110/60    Lab Results  Component Value Date   HGBA1C  09/13/2008    5.8 (NOTE)   The ADA recommends the following therapeutic goal for glycemic   control related to Hgb A1C measurement:   Goal of Therapy:   < 7.0% Hgb A1C   Reference: American Diabetes Association: Clinical Practice   Recommendations 2008, Diabetes Care,  2008, 31:(Suppl 1).     Patient obtains medications through Adherence Packaging  30 Days    Last adherence delivery included:  Aspirin 81 mg: one tablet at bedtime Allopurinol (ZYLOPRIM) 300 mg: one tablet at breakfast Donepezil (ARICEPT) 10 mg: one tablet at breakfast Ezetimibe (ZETIA) 10 mg: one tablet at breakfast Metoprolol succinate (TOPROL-XL) 25 MG 24 hr: one tablet at breakfast Simvastatin (ZOCOR) 40 mg: one tablet at bedtime Mirtazapine (Remeron) 15 mg: one tablet at bedtime Docusate 100 mg: one capsule at breakfast and one at bedtime     Patient declined (meds) last month due to PRN use. Fluticasone (CUTIVATE) 0.05 %  cream Desoximetasone (TOPICORT) 0.25 % cream     Patient is due for next adherence delivery on: 08/18/2021   Called patient and reviewed medications with patients daughter [name] and coordinated delivery.   This delivery to include: Aspirin 81 mg - one tablet at bedtime Allopurinol (ZYLOPRIM) 300 mg - one tablet at breakfast Donepezil (ARICEPT) 10 mg - one tablet at breakfast Ezetimibe (ZETIA) 10 mg - one tablet at breakfast Metoprolol succinate (TOPROL-XL) 25 MG  - one tablet at breakfast Simvastatin (ZOCOR) 40 mg - one tablet at bedtime Mirtazapine (Remeron) 15 mg - one tablet at bedtime Docusate 100 mg - one capsule at breakfast and one at bedtime     Patient declined the following medications due to PRN use.   Fluticasone (CUTIVATE) 0.05 % cream  Desoximetasone (TOPICORT) 0.25 % cream   Confirmed delivery date of 08/18/2021, advised patients daughter [name] that pharmacy will contact them the morning of delivery.   Care Gaps: AWV- message sent to [name]  Last BP - 98/59 on 06/22/2021 Zoster vaccines - never done Covid booster - overdue  TDAP - overdue  Colonoscopy - overdue   Star Rating Drugs: Simvastatin 40mg  - last filled on 07/13/2021 30DS at [facility] Pharmacist Assistant [phone]

## 2021-09-03 ENCOUNTER — Other Ambulatory Visit: Payer: Self-pay | Admitting: Cardiology

## 2021-09-04 DIAGNOSIS — Z20822 Contact with and (suspected) exposure to covid-19: Secondary | ICD-10-CM | POA: Diagnosis not present

## 2021-09-06 ENCOUNTER — Telehealth: Payer: Self-pay | Admitting: Pharmacist

## 2021-09-06 NOTE — Chronic Care Management (AMB) (Addendum)
? ? ?Chronic Care Management ?Pharmacy Assistant  ? ?Name: Jesus Jordan  MRN: [id number] DOB: 12-19-1945 ? ?Reason for Encounter: Medication Review / Medication Coordination Call ?  ?Conditions to be addressed/monitored: ?HTN ? ?Recent office visits:  ?None ? ?Recent consult visits:  ?None ? ?Hospital visits:  ?None ? ?Medications: ?Outpatient Encounter Medications as of 09/06/2021  ?Medication Sig  ? acetaminophen (TYLENOL) 325 MG tablet Take 1-2 tablets (325-650 mg total) by mouth every 6 (six) hours as needed for mild pain (pain score 1-3 or temp > 100.5).  ? allopurinol (ZYLOPRIM) 300 MG tablet TAKE ONE TABLET BY MOUTH EVERY MORNING  ? donepezil (ARICEPT) 10 MG tablet TAKE ONE TABLET BY MOUTH EVERY MORNING  ? doxycycline (VIBRA-TABS) 100 MG tablet Take 100 mg by mouth daily. For rosacea/continuous  ? ezetimibe (ZETIA) 10 MG tablet TAKE ONE TABLET BY MOUTH ONCE DAILY  ? folic acid (FOLVITE) 1 MG tablet Take 1 tablet (1 mg total) by mouth daily.  ? metoprolol succinate (TOPROL-XL) 25 MG 24 hr tablet TAKE ONE TABLET BY MOUTH EVERY MORNING  ? mirtazapine (REMERON) 15 MG tablet TAKE ONE TABLET BY MOUTH EVERYDAY AT BEDTIME  ? Multiple Vitamin (MULTIVITAMIN WITH MINERALS) TABS tablet Take 1 tablet by mouth daily.  ? ondansetron (ZOFRAN) 4 MG tablet Take 1 tablet (4 mg total) by mouth every 6 (six) hours as needed for nausea.  ? polyethylene glycol (MIRALAX / GLYCOLAX) 17 g packet Take 17 g by mouth daily as needed for mild constipation.  ? senna-docusate (SENOKOT-S) 8.6-50 MG tablet Take 1 tablet by mouth 2 (two) times daily.  ? simvastatin (ZOCOR) 40 MG tablet TAKE ONE TABLET BY MOUTH EVERYDAY AT BEDTIME  ? tetrahydrozoline-zinc (VISINE-AC) 0.05-0.25 % ophthalmic solution Place 2 drops into both eyes 3 (three) times daily as needed (for itching ot redness).   ? thiamine 100 MG tablet Take 1 tablet (100 mg total) by mouth daily.  ? traMADol (ULTRAM) 50 MG tablet Take 1 tablet (50 mg total) by mouth every 6 (six) hours as  needed for moderate pain.  ? ?No facility-administered encounter medications on file as of 09/06/2021.  ? ?Reviewed chart for medication changes ahead of medication coordination call. ? ?No OVs, Consults, or hospital visits since last care coordination call/Pharmacist visit. (If appropriate, list visit date, provider name) ? ?No medication changes indicated OR if recent visit, treatment plan here. ? ?BP Readings from Last 3 Encounters:  ?06/22/21 (!) 98/59  ?06/02/21 115/71  ?05/05/21 110/60  ?  ?Lab Results  ?Component Value Date  ? HGBA1C  09/13/2008  ?  5.8 ?(NOTE)   The ADA recommends the following therapeutic goal for glycemic   control related to Hgb A1C measurement:   Goal of Therapy:   < 7.0% Hgb A1C   Reference: American Diabetes Association: Clinical Practice   Recommendations 2008, Diabetes Care,  ?2008, 31:(Suppl 1).  ?  ? ?Patient obtains medications through Adherence Packaging  30 Days  ?  ?Last adherence delivery included:  ?Aspirin 81 mg - one tablet at bedtime ?Allopurinol (ZYLOPRIM) 300 mg - one tablet at breakfast ?Donepezil (ARICEPT) 10 mg - one tablet at breakfast ?Ezetimibe (ZETIA) 10 mg - one tablet at breakfast ?Metoprolol succinate (TOPROL-XL) 25 MG - one tablet at breakfast ?Simvastatin (ZOCOR) 40 mg - one tablet at bedtime ?Mirtazapine (Remeron) 15 mg - one tablet at bedtime ?Docusate 100 mg - one capsule at breakfast and one at bedtime ?  ?  ?Patient declined (meds) last month due to  PRN use. ?Fluticasone (CUTIVATE) 0.05 % cream ?Desoximetasone (TOPICORT) 0.25 % cream ? ? ?Patient is due for next adherence delivery on: 09/16/2021. ?Called patient and reviewed medications and coordinated delivery. ? ?This delivery to include: ?Aspirin 81 mg - one tablet at bedtime ?Allopurinol (ZYLOPRIM) 300 mg - one tablet at breakfast ?Donepezil (ARICEPT) 10 mg - one tablet at breakfast ?Ezetimibe (ZETIA) 10 mg - one tablet at breakfast ?Metoprolol succinate (TOPROL-XL) 25 MG - one tablet at  breakfast ?Simvastatin (ZOCOR) 40 mg - one tablet at bedtime ?Mirtazapine (Remeron) 15 mg - one tablet at bedtime ?Docusate 100 mg - one capsule at breakfast and one at bedtime ? ?Patient will need a short fill: ? ?Coordinated acute fill: ? ?Patient declined the following medications due to PRN use.  ? Fluticasone (CUTIVATE) 0.05 % cream ? Desoximetasone (TOPICORT) 0.25 % cream ? ?Confirmed delivery (with daughter [name]) date of 09/16/2021, advised patient that pharmacy will contact them the morning of delivery. ? ?Reviewed chart prior to disease state call. Spoke with patient regarding BP ? ?Recent Office Vitals: ?BP Readings from Last 3 Encounters:  ?06/22/21 (!) 98/59  ?06/02/21 115/71  ?05/05/21 110/60  ? ?Pulse Readings from Last 3 Encounters:  ?06/22/21 71  ?06/02/21 64  ?05/05/21 80  ?  ?Wt Readings from Last 3 Encounters:  ?06/22/21 155 lb 9.6 oz (70.6 kg)  ?06/02/21 153 lb 3.2 oz (69.5 kg)  ?12/14/20 144 lb 8 oz (65.5 kg)  ?  ? ?Kidney Function ?Lab Results  ?Component Value Date/Time  ? CREATININE 0.96 09/09/2020 05:33 AM  ? CREATININE 0.85 09/07/2020 07:22 AM  ? CREATININE 0.90 02/10/2020 11:48 AM  ? CREATININE 1.07 03/18/2014 10:51 AM  ? GFR 95.90 07/10/2017 03:45 PM  ? GFRNONAA >60 09/09/2020 05:33 AM  ? GFRAA >60 08/28/2019 03:54 PM  ? ? ?BMP Latest Ref Rng & Units 09/09/2020 09/07/2020 09/06/2020  ?Glucose 70 - 99 mg/dL 99 103(H) 101(H)  ?BUN 8 - 23 mg/dL 25(H) 24(H) 29(H)  ?Creatinine 0.61 - 1.24 mg/dL 0.96 0.85 0.79  ?BUN/Creat Ratio 6 - 22 (calc) - - -  ?Sodium 135 - 145 mmol/L 135 136 138  ?Potassium 3.5 - 5.1 mmol/L 3.9 3.7 3.9  ?Chloride 98 - 111 mmol/L 103 102 104  ?CO2 22 - 32 mmol/L '26 26 23  '$ ?Calcium 8.9 - 10.3 mg/dL 8.8(L) 9.1 9.0  ? ? ?Current antihypertensive regimen:  ?Toprol XL 100 mg daily ? ?How often are you checking your Blood Pressure? Patients daughter states she checks his blood pressure 2-3 times per week ? ?Current home BP readings: Patients daughter states his last reading was  114/76 and is always in this range.  ? ?What recent interventions/DTPs have been made by any provider to improve Blood Pressure control since last CPP Visit: No recent interventions ? ?Any recent hospitalizations or ED visits since last visit with CPP? No recent hospital visits.  ? ?What diet changes have been made to improve Blood Pressure Control?  ?Patient is not following any special diet ?Breakfast - he will have eggs, toast, bacon or sausage ?Lunch - generally doesn't eat lunch ?Dinner - he will have a meal including a meat and vegetable ? ?What exercise is being done to improve your Blood Pressure Control?  ?Patient goes to play pickleball with a friend often.  ? ?Adherence Review: ?Is the patient currently on ACE/ARB medication? No ?Does the patient have >5 day gap between last estimated fill dates? No ? ?Care Gaps: ?AWV- previous message sent to  [name]  ?Last BP - 98/59 on 06/22/2021 ?Zoster vaccines - never done ?Covid booster - overdue  ?TDAP - overdue  ?Colonoscopy - overdue  ?  ?Star Rating Drugs: ?Simvastatin '40mg'$  - last filled on 08/12/2021 30DS at [facility] ? ?Gennie Alma CMA  ?Clinical Pharmacist Assistant ?[phone] ? ?

## 2021-10-04 DIAGNOSIS — Z20822 Contact with and (suspected) exposure to covid-19: Secondary | ICD-10-CM | POA: Diagnosis not present

## 2021-10-05 ENCOUNTER — Other Ambulatory Visit: Payer: Self-pay | Admitting: Family Medicine

## 2021-10-06 ENCOUNTER — Telehealth: Payer: Self-pay | Admitting: Pharmacist

## 2021-10-06 NOTE — Chronic Care Management (AMB) (Signed)
? ? ?Chronic Care Management ?Pharmacy Assistant  ? ?Name: Jesus Jordan  MRN: [id number] DOB: 04/18/1946 ? ?Reason for Encounter: Medication Review / Medication Coordination Call ?  ?Conditions to be addressed/monitored: ?DMII ? ?Recent office visits:  ?None ? ?Recent consult visits:  ?None ? ?Hospital visits:  ?None ? ?Medications: ?Outpatient Encounter Medications as of 10/06/2021  ?Medication Sig  ? acetaminophen (TYLENOL) 325 MG tablet Take 1-2 tablets (325-650 mg total) by mouth every 6 (six) hours as needed for mild pain (pain score 1-3 or temp > 100.5).  ? allopurinol (ZYLOPRIM) 300 MG tablet TAKE ONE TABLET BY MOUTH EVERY MORNING  ? donepezil (ARICEPT) 10 MG tablet TAKE ONE TABLET BY MOUTH EVERY MORNING  ? doxycycline (VIBRA-TABS) 100 MG tablet Take 100 mg by mouth daily. For rosacea/continuous  ? ezetimibe (ZETIA) 10 MG tablet TAKE ONE TABLET BY MOUTH ONCE DAILY  ? folic acid (FOLVITE) 1 MG tablet Take 1 tablet (1 mg total) by mouth daily.  ? metoprolol succinate (TOPROL-XL) 25 MG 24 hr tablet TAKE ONE TABLET BY MOUTH EVERY MORNING  ? mirtazapine (REMERON) 15 MG tablet TAKE ONE TABLET BY MOUTH EVERYDAY AT BEDTIME  ? Multiple Vitamin (MULTIVITAMIN WITH MINERALS) TABS tablet Take 1 tablet by mouth daily.  ? ondansetron (ZOFRAN) 4 MG tablet Take 1 tablet (4 mg total) by mouth every 6 (six) hours as needed for nausea.  ? polyethylene glycol (MIRALAX / GLYCOLAX) 17 g packet Take 17 g by mouth daily as needed for mild constipation.  ? senna-docusate (SENOKOT-S) 8.6-50 MG tablet Take 1 tablet by mouth 2 (two) times daily.  ? simvastatin (ZOCOR) 40 MG tablet TAKE ONE TABLET BY MOUTH EVERYDAY AT BEDTIME  ? tetrahydrozoline-zinc (VISINE-AC) 0.05-0.25 % ophthalmic solution Place 2 drops into both eyes 3 (three) times daily as needed (for itching ot redness).   ? thiamine 100 MG tablet Take 1 tablet (100 mg total) by mouth daily.  ? traMADol (ULTRAM) 50 MG tablet Take 1 tablet (50 mg total) by mouth every 6 (six) hours as  needed for moderate pain.  ? ?No facility-administered encounter medications on file as of 10/06/2021.  ? ?Reviewed chart for medication changes ahead of medication coordination call. ? ?No OVs, Consults, or hospital visits since last care coordination call/Pharmacist visit. (If appropriate, list visit date, provider name) ? ?No medication changes indicated OR if recent visit, treatment plan here. ? ?BP Readings from Last 3 Encounters:  ?06/22/21 (!) 98/59  ?06/02/21 115/71  ?05/05/21 110/60  ?  ?Lab Results  ?Component Value Date  ? HGBA1C  09/13/2008  ?  5.8 ?(NOTE)   The ADA recommends the following therapeutic goal for glycemic   control related to Hgb A1C measurement:   Goal of Therapy:   < 7.0% Hgb A1C   Reference: American Diabetes Association: Clinical Practice   Recommendations 2008, Diabetes Care,  ?2008, 31:(Suppl 1).  ?  ? ?Patient obtains medications through Adherence Packaging  30 Days  ?  ?Last adherence delivery included:  ?Aspirin 81 mg - one tablet at bedtime ?Allopurinol (ZYLOPRIM) 300 mg - one tablet at breakfast ?Donepezil (ARICEPT) 10 mg - one tablet at breakfast ?Ezetimibe (ZETIA) 10 mg - one tablet at breakfast ?Metoprolol succinate (TOPROL-XL) 25 MG - one tablet at breakfast ?Simvastatin (ZOCOR) 40 mg - one tablet at bedtime ?Mirtazapine (Remeron) 15 mg - one tablet at bedtime ?Docusate 100 mg - one capsule at breakfast and one at bedtime ?  ?  ?Patient declined (meds) last month due to  PRN use. ?Fluticasone (CUTIVATE) 0.05 % cream ?Desoximetasone (TOPICORT) 0.25 % cream ?  ?  ?Patient is due for next adherence delivery on: 10/18/2021 ?Called patient and reviewed medications and coordinated delivery. ?  ?This delivery to include: ?Aspirin 81 mg - one tablet at bedtime ?Allopurinol (ZYLOPRIM) 300 mg - one tablet at breakfast ?Donepezil (ARICEPT) 10 mg - one tablet at breakfast ?Ezetimibe (ZETIA) 10 mg - one tablet at breakfast ?Metoprolol succinate (TOPROL-XL) 25 MG - one tablet at  breakfast ?Simvastatin (ZOCOR) 40 mg - one tablet at bedtime ?Mirtazapine (Remeron) 15 mg - one tablet at bedtime ?Docusate 100 mg - one capsule at breakfast and one at bedtime ?  ?Patient will need a short fill: ?  ?Coordinated acute fill: ?  ?Patient declined the following medications due to PRN use: ? ?Unsure in refills needed:   ? Fluticasone (CUTIVATE) 0.05 % cream ? Desoximetasone (TOPICORT) 0.25 % cream ?  ?Unable to confirm delivery (with daughter [name]) date of 10/18/2021. ?Unable to reach patients daughter [name] after several attempts. ?  ?Care Gaps: ?AWV- previous message sent to [name]  ?Last BP - 98/59 on 06/22/2021 ?Zoster vaccines - never done ?Covid booster - overdue  ?TDAP - overdue  ?Colonoscopy - overdue  ?  ?Star Rating Drugs: ?Simvastatin '40mg'$  - last filled on 09/10/2021 30DS at [facility] ? ?Gennie Alma CMA  ?Clinical Pharmacist Assistant ?[phone] ? ?

## 2021-10-12 ENCOUNTER — Telehealth: Payer: Self-pay

## 2021-10-12 NOTE — Telephone Encounter (Signed)
PC check in call, no answer ?

## 2021-10-23 DIAGNOSIS — Z20822 Contact with and (suspected) exposure to covid-19: Secondary | ICD-10-CM | POA: Diagnosis not present

## 2021-10-25 DIAGNOSIS — Z20822 Contact with and (suspected) exposure to covid-19: Secondary | ICD-10-CM | POA: Diagnosis not present

## 2021-10-27 ENCOUNTER — Telehealth: Payer: Self-pay

## 2021-10-27 NOTE — Telephone Encounter (Signed)
(  1:35 pm) SW left a message requesting a call back from patient's daughter to assess his status, needs and comfort.  ?

## 2021-11-01 ENCOUNTER — Telehealth: Payer: Self-pay | Admitting: Pharmacist

## 2021-11-01 NOTE — Chronic Care Management (AMB) (Signed)
? ? ?  Chronic Care Management ?Pharmacy Assistant  ? ?Name: Jesus Jordan  MRN: [id number] DOB: 05-08-1946 ? ?11/02/2021 APPOINTMENT REMINDER ? ? ?Called [name], No answer, left message of appointment on 11/02/2021 at 2:30 via telephone visit with [name], Pharm D. Notified to have all medications, supplements, blood pressure and/or blood sugar logs available during appointment and to return call if need to reschedule. ? ?Care Gaps: ?AWV- previous message sent to [name]  ?Last BP - 98/59 on 06/22/2021 ?Zoster vaccines - never done ?Covid booster - overdue  ?TDAP - overdue  ?Colonoscopy - overdue  ?  ?Star Rating Drugs: ?Simvastatin '40mg'$  - last filled on 10/12/2021 30DS at [facility] ? ?Any gaps in medications fill history? No ? ?Gennie Alma CMA  ?Clinical Pharmacist Assistant ?[phone] ? ?

## 2021-11-02 ENCOUNTER — Ambulatory Visit (INDEPENDENT_AMBULATORY_CARE_PROVIDER_SITE_OTHER): Payer: Medicare Other | Admitting: Pharmacist

## 2021-11-02 ENCOUNTER — Other Ambulatory Visit: Payer: Self-pay

## 2021-11-02 DIAGNOSIS — E785 Hyperlipidemia, unspecified: Secondary | ICD-10-CM

## 2021-11-02 DIAGNOSIS — I10 Essential (primary) hypertension: Secondary | ICD-10-CM

## 2021-11-02 MED ORDER — EZETIMIBE-SIMVASTATIN 10-40 MG PO TABS: 1.0000 | ORAL_TABLET | Freq: Every day | ORAL | 1 refills | Status: DC

## 2021-11-02 NOTE — Progress Notes (Signed)
? ?Chronic Care Management ?Pharmacy Note ? ?11/02/2021 ?Name:  Jesus Jordan MRN:  [id number] DOB:  11/02/1945 ? ?Summary: ?Pt has had worsening of sleep and is having more frequent hallucinations ?Pt has tried melatonin without relief ? ?Recommendations/Changes made from today's visit: ?-Consider quetiapine for sleep to help with hallucinations ?-Recommended switching simvastatin and ezetimibe to combination product to simplify medication regimen ? ?Plan: ?PCP follow up in 1 week to discuss sleep medications ? ?Subjective: ?Jesus Jordan is an 76 y.o. year old male who is a primary patient of Burchette, Alinda Sierras, MD.  The CCM team was consulted for assistance with disease management and care coordination needs.   ? ?Engaged with patient by telephone for follow up visit in response to provider referral for pharmacy case management and/or care coordination services.  ? ?Consent to Services:  ?The patient was given information about Chronic Care Management services, agreed to services, and gave verbal consent prior to initiation of services.  Please see initial visit note for detailed documentation.  ? ?Patient Care Team: ?Eulas Post, MD as PCP - General ?Troy Sine, MD as PCP - Cardiology (Cardiology) ?Viona Gilmore, [facility] as Pharmacist (Pharmacist) ? ?Recent office visits: ?09/01/20 [name], MD: Patient presented for carpal tunnel. Recommended night splint. ? ?12/14/20 [name], MD: Patient presented for chronic conditions follow up. Lipid panel WNL. Patient requested Cialis rx but concerned about memory with interaction with nitroglycerin use. ? ?Recent consult visits: ?07/06/21 Narda Amber, DO (neurology): Patient presented for nerve conduction studies for neuropathy. ? ?06/22/2021 Shelva Majestic MD (cardiology) - Patient was seen for Coronary artery disease involving native coronary artery of native heart without angina pectoris and additional issues. No medication changes. Follow up in 12  months. ? ?06/17/21 Katheren Puller, LCSW [facility]): Patient presented for home visit. ? ?Hospital visits: ?None in previous 6 months ? ?Objective: ? ?Lab Results  ?Component Value Date  ? CREATININE 0.96 09/09/2020  ? BUN 25 (H) 09/09/2020  ? GFR 95.90 07/10/2017  ? GFRNONAA >60 09/09/2020  ? GFRAA >60 08/28/2019  ? NA 135 09/09/2020  ? K 3.9 09/09/2020  ? CALCIUM 8.8 (L) 09/09/2020  ? CO2 26 09/09/2020  ? GLUCOSE 99 09/09/2020  ? ? ?Lab Results  ?Component Value Date/Time  ? HGBA1C  09/13/2008 03:30 AM  ?  5.8 ?(NOTE)   The ADA recommends the following therapeutic goal for glycemic   control related to Hgb A1C measurement:   Goal of Therapy:   < 7.0% Hgb A1C   Reference: American Diabetes Association: Clinical Practice   Recommendations 2008, Diabetes Care,  ?2008, 31:(Suppl 1).  ? GFR 95.90 07/10/2017 03:45 PM  ? GFR 100.39 03/21/2016 12:19 PM  ?  ?Last diabetic Eye exam: No results found for: HMDIABEYEEXA  ?Last diabetic Foot exam: No results found for: HMDIABFOOTEX  ? ?Lab Results  ?Component Value Date  ? CHOL 135 12/14/2020  ? HDL 62.70 12/14/2020  ? LDLCALC 45 12/14/2020  ? TRIG 138.0 12/14/2020  ? CHOLHDL 2 12/14/2020  ? ? ? ?  Latest Ref Rng & Units 12/14/2020  ?  2:22 PM 09/02/2020  ?  5:14 PM 02/10/2020  ? 11:48 AM  ?Hepatic Function  ?Total Protein 6.0 - 8.3 g/dL 7.0   7.1   6.9    ?Albumin 3.5 - 5.2 g/dL 4.4   4.1     ?AST 0 - 37 U/L '29   28   28    ' ?ALT 0 - 53 U/L  '24   20   21    ' ?Alk Phosphatase 39 - 117 U/L 80   64     ?Total Bilirubin 0.2 - 1.2 mg/dL 0.4   1.3   0.8    ?Bilirubin, Direct 0.0 - 0.3 mg/dL 0.1    0.2    ? ? ?Lab Results  ?Component Value Date/Time  ? TSH 0.402 09/04/2020 05:22 AM  ? TSH 1.40 02/10/2020 11:48 AM  ? TSH 0.68 03/21/2016 12:19 PM  ? ? ? ?  Latest Ref Rng & Units 09/09/2020  ?  5:33 AM 09/07/2020  ?  7:22 AM 09/06/2020  ?  5:26 AM  ?CBC  ?WBC 4.0 - 10.5 K/uL 7.9   6.9   8.6    ?Hemoglobin 13.0 - 17.0 g/dL 10.9   12.2   12.0    ?Hematocrit 39.0 - 52.0 % 33.7   37.2   36.1     ?Platelets 150 - 400 K/uL 174   144   141    ? ? ?Lab Results  ?Component Value Date/Time  ? VD25OH 35.85 09/02/2020 05:14 PM  ? ? ?Clinical ASCVD: Yes  ?The ASCVD Risk score (Arnett DK, et al., 2019) failed to calculate for the following reasons: ?  The patient has a prior MI or stroke diagnosis   ? ? ?  07/14/2020  ? 10:28 AM 04/29/2019  ?  9:40 AM 04/18/2018  ?  7:46 AM  ?Depression screen PHQ 2/9  ?Decreased Interest 0 1 1  ?Down, Depressed, Hopeless 1 0 1  ?PHQ - 2 Score '1 1 2  ' ?Altered sleeping 1  1  ?Tired, decreased energy 1  0  ?Change in appetite 0  1  ?Feeling bad or failure about yourself  1  0  ?Trouble concentrating 1  1  ?Moving slowly or fidgety/restless 1  0  ?Suicidal thoughts 0  0  ?PHQ-9 Score 6  5  ?Difficult doing work/chores   Somewhat difficult  ?  ? ?Social History  ? ?Tobacco Use  ?Smoking Status Some Days  ? Years: 30.00  ? Types: Cigarettes  ?Smokeless Tobacco Never  ?Tobacco Comments  ? skip days some; pack last 3 days   ? ?BP Readings from Last 3 Encounters:  ?06/22/21 (!) 98/59  ?06/02/21 115/71  ?05/05/21 110/60  ? ?Pulse Readings from Last 3 Encounters:  ?06/22/21 71  ?06/02/21 64  ?05/05/21 80  ? ?Wt Readings from Last 3 Encounters:  ?06/22/21 155 lb 9.6 oz (70.6 kg)  ?06/02/21 153 lb 3.2 oz (69.5 kg)  ?12/14/20 144 lb 8 oz (65.5 kg)  ? ?BMI Readings from Last 3 Encounters:  ?06/22/21 22.33 kg/m?  ?06/02/21 22.62 kg/m?  ?12/14/20 20.73 kg/m?  ? ? ?Assessment/Interventions: Review of patient past medical history, allergies, medications, health status, including review of consultants reports, laboratory and other test data, was performed as part of comprehensive evaluation and provision of chronic care management services.  ? ?SDOH:  (Social Determinants of Health) assessments and interventions performed: No ? ?SDOH Screenings  ? ?Alcohol Screen: Not on file  ?Depression (PHQ2-9): Not on file  ?Financial Resource Strain: Not on file  ?Food Insecurity: Not on file  ?Housing: Not on  file  ?Physical Activity: Not on file  ?Social Connections: Not on file  ?Stress: Not on file  ?Tobacco Use: High Risk  ? Smoking Tobacco Use: Some Days  ? Smokeless Tobacco Use: Never  ? Passive Exposure: Not on file  ?Transportation Needs: Not on file  ? ? ?  CCM Care Plan ? ?Allergies  ?Allergen Reactions  ? Imdur [Isosorbide Nitrate] Other (See Comments)  ?  Caused vertigo  ? Lisinopril Other (See Comments)  ?  Possible vertigo??  ? ? ?Medications Reviewed Today   ? ? Reviewed by Troy Sine, MD (Physician) on 06/23/21 at [facility] List Status: <None>  ? ?Medication Order Taking? Sig Documenting Provider Last Dose Status Informant  ?acetaminophen (TYLENOL) 325 MG tablet [id number] Yes Take 1-2 tablets (325-650 mg total) by mouth every 6 (six) hours as needed for mild pain (pain score 1-3 or temp > 100.5). Cherlynn June B, [place] Taking Active   ?allopurinol (ZYLOPRIM) 300 MG tablet [id number] Yes TAKE ONE TABLET BY MOUTH EVERY MORNING Burchette, Alinda Sierras, MD Taking Active   ?donepezil (ARICEPT) 10 MG tablet [id number] Yes TAKE ONE TABLET BY MOUTH EVERY MORNING Burchette, Alinda Sierras, MD Taking Active   ?doxycycline (VIBRA-TABS) 100 MG tablet [id number] Yes Take 100 mg by mouth daily. For rosacea/continuous [provider] Taking Active   ?ezetimibe (ZETIA) 10 MG tablet [id number] Yes TAKE ONE TABLET BY MOUTH ONCE DAILY Stanford Breed Denice Bors, MD Taking Active   ?folic acid (FOLVITE) 1 MG tablet [id number] Yes Take 1 tablet (1 mg total) by mouth daily. Georgette Shell, MD Taking Active   ?metoprolol succinate (TOPROL-XL) 25 MG 24 hr tablet [id number] Yes TAKE ONE TABLET BY MOUTH EVERY MORNING Burchette, Alinda Sierras, MD Taking Active   ?mirtazapine (REMERON) 15 MG tablet [id number] Yes TAKE ONE TABLET BY MOUTH EVERYDAY AT BEDTIME Burchette, Alinda Sierras, MD Taking Active   ?Multiple Vitamin (MULTIVITAMIN WITH MINERALS) TABS tablet [id number] Yes Take 1 tablet by mouth daily. Georgette Shell, MD Taking Active   ?ondansetron  [facility]) 4 MG tablet [id number] Yes Take 1 tablet (4 mg total) by mouth every 6 (six) hours as needed for nausea. Georgette Shell, MD Taking Active   ?polyethylene glycol (MIRALAX / GLYCOLAX) 17 g pack

## 2021-11-02 NOTE — Progress Notes (Signed)
See staff messages:  ? ?Medication sent to pharmacy- med list updated.  ?Thanks! ? ?Supple, Harlon Flor, RPH-CPP  Caprice Beaver, LPN ?Yes that's just fine to change to the combo pill   ?  ?   ?Previous Messages ?  ?----- Message -----  ?From: Caprice Beaver, LPN  ?Sent: 11/02/2021   3:50 PM EDT  ?To: Cv Div Pharmd  ?Subject: FW: Combination simvastatin and ezetimibe      ? ? ?----- Message -----  ?From: Gean Birchwood, CMA  ?Sent: 11/02/2021   3:41 PM EDT  ?To: Troy Sine, MD, Caprice Beaver, LPN  ?Subject: FW: Combination simvastatin and ezetimibe      ? ? ?----- Message -----  ?From: Viona Gilmore, [facility]  ?Sent: 11/02/2021   3:38 PM EDT  ?To: Cv Div Nl Refills  ?Subject: Combination simvastatin and ezetimibe          ? ?Hi,  ? ?I just spoke with Mr. Gopaul daughter and anything we can do to help compliance with his medications would be much appreciated. Would it be possible to switch him to the combination of simvastatin and ezetimibe to help with this? It should be the same cost for him. If so, can you please send a refill to [facility]?  ? ?Thank you!  ?Maddie   ? ? ? ? ? ? ? ?

## 2021-11-02 NOTE — Patient Instructions (Signed)
Hi Jesus Jordan, ? ?It was great to catch up again! ? ?Please reach out to me if you have any questions or need anything! ? ?Best, ?Maddie ? ?[name], PharmD, BCACP ?Clinical Pharmacist ?Therapist, music at [facility] [facility] ?[phone] ? ? Visit Information ? ? Goals Addressed   ?None ?  ? ?Patient Care Plan: [name]  ?  ? ?Problem Identified: Problem: Hypertension, Hyperlipidemia, Coronary Artery Disease, GERD, Depression, Gout, and Memory loss   ?  ? ?Long-Range Goal: Patient-Specific Goal   ?Start Date: 01/12/2021  ?Expected End Date: 01/12/2022  ?Recent Progress: On track  ?Priority: High  ?Note:   ?Current Barriers:  ?Unable to independently monitor therapeutic efficacy ? ?Pharmacist Clinical Goal(s):  ?Patient will achieve adherence to monitoring guidelines and medication adherence to achieve therapeutic efficacy through collaboration with PharmD and provider.  ? ?Interventions: ?1:1 collaboration with Eulas Post, MD regarding development and update of comprehensive plan of care as evidenced by provider attestation and co-signature ?Inter-disciplinary care team collaboration (see longitudinal plan of care) ?Comprehensive medication review performed; medication list updated in electronic medical record ? ?Hypertension (BP goal <130/80) ?-Controlled ?-Current treatment: ?Metoprolol succinate 25 mg 1 tablet daily - Appropriate, Effective, Safe, Accessible ?-Medications previously tried: carvedilol (adherence), lisinopril (vertigo)  ?-Current home readings: 109/76 (checking every other day) ?-Current dietary habits: limiting salt intake ?-Current exercise habits: limited due to recent fall and fracture ?-Denies hypotensive/hypertensive symptoms ?-Educated on BP goals and benefits of medications for prevention of heart attack, stroke and kidney damage; ?Importance of home blood pressure monitoring; ?Proper BP monitoring technique; ?-Counseled to monitor BP at home weekly, document, and  provide log at future appointments ?-Counseled on diet and exercise extensively ?Recommended to continue current medication ? ?Hyperlipidemia: (LDL goal < 70) ?-Controlled ?-Current treatment: ?Simvastatin 40 mg 1 tablet daily - Appropriate, Effective, Safe, Accessible ?Ezetimibe 10 mg 1 tablet daily - Appropriate, Effective, Safe, Accessible ?-Medications previously tried: none  ?-Current dietary patterns: did not discuss ?-Current exercise habits: limited due to recent fall and fracture ?-Educated on Cholesterol goals;  ?Importance of limiting foods high in cholesterol; ?Exercise goal of 150 minutes per week; ?-Counseled on diet and exercise extensively ?Recommended combining medications into one tablet. Patient needs a cardiology appt before this can be done. ? ?CAD/History of MI (Goal: prevent heart events) ?-Controlled ?-Current treatment  ?Aspirin 81 mg 1 tablet daily in PM - Appropriate, Effective, Safe, Accessible ?Ezetimibe 10 mg 1 tablet daily - Appropriate, Effective, Safe, Accessible ?Simvastatin 40 mg 1 tablet daily - Appropriate, Effective, Safe, Accessible ?Nitroglycerin 0.4 mg SL tablet PRN - Appropriate, Effective, Safe, Accessible ?-Medications previously tried: clopidogrel (side effects)  ?-Recommended to continue current medication ? ?Depression (Goal: minimize symptoms) ?-Controlled ?-Current treatment: ?Mirtazapine 15 mg 1 tablet at bedtime - Appropriate, Query effective, Safe, Accessible ?-Medications previously tried/failed: n/a ?-PHQ9: 6 ?-Educated on Benefits of medication for symptom control ?-Recommended to continue current medication ? ?Gout (Goal: prevent flare ups and maintain uric acid < 6) ?-Controlled ?-Current treatment  ?Allopurinol 300 mg 1 tablet daily ?-Medications previously tried: none  ?-Recommended to continue current medication ? ?Memory loss (Goal: slow memory loss) ?-Not ideally controlled ?-Current treatment  ?Donepezil 10 mg 1 tablet in the morning ?-Medications  previously tried: none  ?-Recommended to reach out to neurologist to discuss alternative therapy. Discussed possibly switching to rivastigmine patches to minimize side effects depending on coverage/cost. [name] will discuss wit h neurology. ? ?GERD (Goal: minimize symptoms) ?-Controlled ?-Current treatment  ?  Tums 500 mg 1-2 tablets as needed ?Pantoprazole 40 mg 1 tablet daily as needed ?-Medications previously tried: none  ?-Recommended to continue current medication ? ? ?Health Maintenance ?-Vaccine gaps: shingrix, COVID booster, tetanus ?-Current therapy:  ?Desoximetasone 0.25% cream apply twice daily as directed ?Fluticasone 0.05% cream apply daily as needed ?Visine AC 0.05%-0.25% apply 2 drops in both eyes three times daily ?Milk thistle 1000 mg 1 capsule daily ?-Educated on Cost vs benefit of each product must be carefully weighed by individual consumer ?-Patient is satisfied with current therapy and denies issues ?-Recommended adding docusate to packaging to improve adherence and constipation. ? ?Patient Goals/Self-Care Activities ?Patient will:  ?- take medications as prescribed ?check blood pressure weekly, document, and provide at future appointments ? ?Follow Up Plan: Telephone follow up appointment with care management team member scheduled for: 1 year ? ?  ?  ? ?Patient verbalizes understanding of instructions and care plan provided today and agrees to view in [place]. Active MyChart status confirmed with patient.   ?The pharmacy team will reach out to the patient again over the next 30 days.  ? ?Viona Gilmore, RPH  ?

## 2021-11-05 ENCOUNTER — Telehealth: Payer: Self-pay | Admitting: Pharmacist

## 2021-11-05 NOTE — Chronic Care Management (AMB) (Signed)
Chronic Care Management Pharmacy Assistant   Name: Jesus Jordan  MRN: [id number] DOB: 1946/02/13  Reason for Encounter: Medication Review / Medication Coordination Call   Conditions to be addressed/monitored: HTN  Recent office visits:  None  Recent consult visits:  None  Hospital visits:  None  Medications: Outpatient Encounter Medications as of 11/05/2021  Medication Sig   acetaminophen (TYLENOL) 325 MG tablet Take 1-2 tablets (325-650 mg total) by mouth every 6 (six) hours as needed for mild pain (pain score 1-3 or temp > 100.5).   allopurinol (ZYLOPRIM) 300 MG tablet TAKE ONE TABLET BY MOUTH EVERY MORNING   donepezil (ARICEPT) 10 MG tablet TAKE ONE TABLET BY MOUTH EVERY MORNING   doxycycline (VIBRA-TABS) 100 MG tablet Take 100 mg by mouth daily. For rosacea/continuous   ezetimibe-simvastatin (VYTORIN) 10-40 MG tablet Take 1 tablet by mouth daily.   folic acid (FOLVITE) 1 MG tablet Take 1 tablet (1 mg total) by mouth daily.   metoprolol succinate (TOPROL-XL) 25 MG 24 hr tablet TAKE ONE TABLET BY MOUTH EVERY MORNING   mirtazapine (REMERON) 15 MG tablet TAKE ONE TABLET BY MOUTH EVERYDAY AT BEDTIME   Multiple Vitamin (MULTIVITAMIN WITH MINERALS) TABS tablet Take 1 tablet by mouth daily.   ondansetron (ZOFRAN) 4 MG tablet Take 1 tablet (4 mg total) by mouth every 6 (six) hours as needed for nausea.   polyethylene glycol (MIRALAX / GLYCOLAX) 17 g packet Take 17 g by mouth daily as needed for mild constipation.   senna-docusate (SENOKOT-S) 8.6-50 MG tablet Take 1 tablet by mouth 2 (two) times daily.   tetrahydrozoline-zinc (VISINE-AC) 0.05-0.25 % ophthalmic solution Place 2 drops into both eyes 3 (three) times daily as needed (for itching ot redness).    thiamine 100 MG tablet Take 1 tablet (100 mg total) by mouth daily.   traMADol (ULTRAM) 50 MG tablet Take 1 tablet (50 mg total) by mouth every 6 (six) hours as needed for moderate pain.   No facility-administered encounter  medications on file as of 11/05/2021.   Reviewed chart for medication changes ahead of medication coordination call.  No OVs, Consults, or hospital visits since last care coordination call/Pharmacist visit. (If appropriate, list visit date, provider name)  No medication changes indicated OR if recent visit, treatment plan here.  BP Readings from Last 3 Encounters:  06/22/21 (!) 98/59  06/02/21 115/71  05/05/21 110/60    Lab Results  Component Value Date   HGBA1C  09/13/2008    5.8 (NOTE)   The ADA recommends the following therapeutic goal for glycemic   control related to Hgb A1C measurement:   Goal of Therapy:   < 7.0% Hgb A1C   Reference: American Diabetes Association: Clinical Practice   Recommendations 2008, Diabetes Care,  2008, 31:(Suppl 1).     Patient obtains medications through Adherence Packaging  30 Days    Last adherence delivery included:  Aspirin 81 mg - one tablet at bedtime Allopurinol (ZYLOPRIM) 300 mg - one tablet at breakfast Donepezil (ARICEPT) 10 mg - one tablet at breakfast Ezetimibe (ZETIA) 10 mg - one tablet at breakfast Metoprolol succinate (TOPROL-XL) 25 MG - one tablet at breakfast Simvastatin (ZOCOR) 40 mg - one tablet at bedtime Mirtazapine (Remeron) 15 mg - one tablet at bedtime Docusate 100 mg - one capsule at breakfast and one at bedtime     Patient declined (meds) last month due to PRN use. Fluticasone (CUTIVATE) 0.05 % cream  Desoximetasone (TOPICORT) 0.25 % cream  Patient is due for next adherence delivery on: 11/16/2021  Called patient and reviewed medications and coordinated delivery.   This delivery to include: Aspirin 81 mg - one tablet at bedtime Allopurinol (ZYLOPRIM) 300 mg - one tablet at breakfast Donepezil (ARICEPT) 10 mg - one tablet at breakfast Metoprolol succinate (TOPROL-XL) 25 MG - one tablet at breakfast Mirtazapine (Remeron) 15 mg - one tablet at bedtime Docusate 100 mg - one capsule at breakfast and one at  bedtime  New prescription to replace Simvastatin and Ezetimibe Vytorin 10/40 mg - take one tablet at breakfast ([facility] on 11/04/2021)   Patient will need a short fill:   Coordinated acute fill:   Patient declined the following medications due to PRN use:   Unsure in refills needed:    Fluticasone (CUTIVATE) 0.05 % cream  Desoximetasone (TOPICORT) 0.25 % cream   Unable to confirm delivery (with daughter [name]) date of 11/16/2021. CCM deadline today 11/05/2021   Care Gaps: AWV- previous message sent to [name]  Last BP - 98/59 on 06/22/2021 Shingrix - never done Covid booster - overdue Tdap - overdue Colonoscopy - overdue  Star Rating Drugs: Vytorin 10/40 mg  - last filled 11/04/2021 90 DS at [facility] Pharmacist Assistant [phone]

## 2021-11-10 ENCOUNTER — Encounter: Payer: Self-pay | Admitting: Family Medicine

## 2021-11-10 ENCOUNTER — Ambulatory Visit (INDEPENDENT_AMBULATORY_CARE_PROVIDER_SITE_OTHER): Payer: Medicare Other | Admitting: Family Medicine

## 2021-11-10 ENCOUNTER — Telehealth: Payer: Self-pay | Admitting: Cardiovascular Disease

## 2021-11-10 VITALS — BP 102/60 | HR 60 | Temp 97.4°F | Ht 70.0 in | Wt 155.3 lb

## 2021-11-10 DIAGNOSIS — I2510 Atherosclerotic heart disease of native coronary artery without angina pectoris: Secondary | ICD-10-CM | POA: Diagnosis not present

## 2021-11-10 DIAGNOSIS — G4700 Insomnia, unspecified: Secondary | ICD-10-CM

## 2021-11-10 DIAGNOSIS — F0280 Dementia in other diseases classified elsewhere without behavioral disturbance: Secondary | ICD-10-CM

## 2021-11-10 DIAGNOSIS — L719 Rosacea, unspecified: Secondary | ICD-10-CM | POA: Diagnosis not present

## 2021-11-10 MED ORDER — QUETIAPINE FUMARATE 25 MG PO TABS: 25.0000 mg | ORAL_TABLET | Freq: Every day | ORAL | 5 refills | Status: DC

## 2021-11-10 MED ORDER — EZETIMIBE-SIMVASTATIN 10-40 MG PO TABS: 1.0000 | ORAL_TABLET | Freq: Every day | ORAL | 1 refills | Status: DC

## 2021-11-10 MED ORDER — DOXYCYCLINE HYCLATE 100 MG PO TABS: 100.0000 mg | ORAL_TABLET | Freq: Every day | ORAL | 0 refills | Status: DC

## 2021-11-10 NOTE — Progress Notes (Signed)
Established Patient Office Visit  Subjective   Patient ID: Jesus Jordan, male    DOB: 09-Dec-1945  Age: 76 y.o. MRN: [id number]  Chief Complaint  Patient presents with   Insomnia    [name] complains of insomnia, [name] reported that the pt stays up for multiple hours a dau     HPI   Patient here accompanied by daughter.  His chronic problems include history of hypertension, CAD, COPD, neurocognitive disorder with probable dementia.  Past history of alcohol abuse.  He is currently on Aricept 10 mg daily.  MRI scan last year.  This showed significant atrophy.  There are recent issues that he is not sleeping well at all at night.  Occasional mild agitation.  Drinks some coffee in the morning and occasionally later in the day.  They have tried melatonin without improvement.  Daughter states sometimes he stays up most the night.  This has been very challenging for her as expected.  He is currently on Remeron 15 mg nightly which does not seem to help much with sleep issues.  He has history of rosacea and then requesting refill of doxycycline.  He has seen dermatologist in the past but cannot get in right away.  Has had good success with doxycycline 100 mg once daily.  Has not done well with topicals.  He does have follow-up with dermatologist this summer later.  Past Medical History:  Diagnosis Date   Alcohol abuse, daily use    05/29/20 - Reported consuming a 6 pack per day   CAD in native artery 05/03/2016   COPD (chronic obstructive pulmonary disease) 12/10/2018   Coronary atherosclerosis 01/29/2009   Essential hypertension 12/10/2018   GERD (gastroesophageal reflux disease) 04/18/2018   Gout    Hearing loss    History of MI (myocardial infarction) 09/12/2008   large inferolateral MI secondary to total occlusion of circumflex with VF cardiac arrest   History of nuclear stress test 02/23/2012   exercise myoview; area of scar in inferolateral wall at mid-basal  region   Hyperlipidemia 03/06/2013   target LDL less than 70   Inguinal hernia 05/25/2009   Major neurocognitive disorder due to multiple etiologies without behavioral disturbance 05/29/2020   Past Surgical History:  Procedure Laterality Date   CORONARY STENT PLACEMENT  09/12/2008   r/t MI; 3.5x12m Xience DES to circumflex & PTCA at multiple sites in distal region of vessel; alos diffusely diseaase, narrowed, nondominant RCA   FEMUR IM NAIL Right 09/03/2020   Procedure: INTRAMEDULLARY (IM) NAIL FEMORAL;  Surgeon: SRod Can MD;  Location: [facility];  Service: Orthopedics;  Laterality: Right;   FOOT SURGERY  1960   HERNIA REPAIR  2011   left inguinal hernia   TRANSTHORACIC ECHOCARDIOGRAM  03/02/2009   EOB=09-62% LV systolic function borderline reduced with mild inferior wall hypokinesis; mild-mod MR; mild TR; AV mildly sclerotic; mild aortic root dilatation    reports that he has been smoking cigarettes. He has never used smokeless tobacco. He reports current alcohol use of about 42.0 standard drinks per week. He reports that he does not use drugs. family history includes Alcoholism in his father; Cancer in his paternal grandfather; Colon cancer in his father; Dementia in his sister; Heart disease (age of onset: 369 in his brother; Hyperlipidemia in his brother; Raynaud syndrome in his brother. Allergies  Allergen Reactions   Imdur [Isosorbide Nitrate] Other (See Comments)    Caused vertigo   Lisinopril Other (See Comments)    Possible vertigo??  Review of Systems  Constitutional:  Negative for chills and fever.  Respiratory:  Negative for shortness of breath.   Cardiovascular:  Negative for chest pain.  Gastrointestinal:  Negative for nausea and vomiting.  Genitourinary:  Negative for dysuria.  Neurological:  Negative for headaches.  Psychiatric/Behavioral:  Negative for hallucinations. The patient has insomnia.      Objective:     BP 102/60 (BP Location: Left Arm, Patient  Position: Sitting, Cuff Size: Normal)   Pulse 60   Temp (!) 97.4 F (36.3 C) (Oral)   Ht '5\' 10"'$  (1.778 m)   Wt 155 lb 4.8 oz (70.4 kg)   SpO2 99%   BMI 22.28 kg/m    Physical Exam Vitals reviewed.  Cardiovascular:     Rate and Rhythm: Normal rate and regular rhythm.  Pulmonary:     Effort: Pulmonary effort is normal.     Breath sounds: Normal breath sounds.  Skin:    Comments: Multiple areas of erythema involving the face.  He has thick scaly lesion mid parietal region which may represent squamous cell carcinoma.  Neurological:     Mental Status: He is alert.     No results found for any visits on 11/10/21.    The ASCVD Risk score (Arnett DK, et al., 2019) failed to calculate for the following reasons:   The patient has a prior MI or stroke diagnosis    Assessment & Plan:   #1 neurocognitive impairment with recent progressive insomnia issues.  They have tried melatonin without success.  Has been on Remeron without success.  Prefer to avoid benzodiazepines. We discussed consideration for trial of Seroquel 25 mg nightly.  EKG in January unremarkable -Daughter will give [place] some feedback in the next couple weeks -Avoid late day use of caffeine -Avoid alcohol  #2 rosacea.  Requesting refills of doxycycline until he can see dermatologist again.  This was provided at 100 mg once daily.  They are aware of risk of photosensitivity.  No follow-ups on file.    [name], MD

## 2021-11-10 NOTE — Telephone Encounter (Signed)
*  STAT* If patient is at the pharmacy, call can be transferred to refill team.   1. Which medications need to be refilled? (please list name of each medication and dose if known)  ezetimibe-simvastatin (VYTORIN) 10-40 MG tablet  2. Which pharmacy/location (including street and city if local pharmacy) is medication to be sent to? [facility] - [place], [place] - [place] [address]  3. Do they need a 30 day or 90 day supply? 90 day supply   Prescription was sent to the wrong pharmacy.

## 2021-11-17 DIAGNOSIS — F32A Depression, unspecified: Secondary | ICD-10-CM

## 2021-11-17 DIAGNOSIS — F17210 Nicotine dependence, cigarettes, uncomplicated: Secondary | ICD-10-CM | POA: Diagnosis not present

## 2021-11-17 DIAGNOSIS — E785 Hyperlipidemia, unspecified: Secondary | ICD-10-CM

## 2021-11-17 DIAGNOSIS — I2510 Atherosclerotic heart disease of native coronary artery without angina pectoris: Secondary | ICD-10-CM | POA: Diagnosis not present

## 2021-11-17 DIAGNOSIS — I10 Essential (primary) hypertension: Secondary | ICD-10-CM | POA: Diagnosis not present

## 2021-12-02 ENCOUNTER — Ambulatory Visit: Payer: Medicare Other | Admitting: Physician Assistant

## 2021-12-02 NOTE — Progress Notes (Incomplete)
Assessment/Plan:   Dementia likely due to    Recommendations:    Continue  10 mg   Side effects were discussed Follow up in   months.   Case discussed with Dr. Delice Lesch who agrees with the plan     Subjective:    Jesus Jordan is a very pleasant 76 y.o. RH male with a history of hypertension, hyperlipidemia, CAD, COPD, alcohol abuse, presenting for evaluation of dementia. This patient is accompanied in the office by his daughter who supplements the history.  Previous records as well as any outside records available were reviewed prior to todays visit.  Patient was last seen at our office on 12/07/2020.  Brain MRI showed mild to moderate diffuse atrophy, no acute changes. Patient is currently on donepezil 10 mg daily now in the morning due to agitation tolerating it better.  The patient's daughter states that the "memory tends to get some sundowners ".  However, the memory is about the same.  He continues to hide things in the morning, then telling the family that someone stole them.  She began putting air tags and all the important things.  For example, the other day he lost his wallet, who he believed people took it but she was able to find it because of these [employer].  He has visual hallucinations.  He sees people in the house, his brother Annie Main who is deceased, and "a little girl who talks to me ".  "Sometimes I talk about ".  He continues to repeat the same stories and assessing questions.  He only drives short distances and his daughter put her dog in the car, and him, he drives with GPS only.Marland Kitchen  His mood is good, denies any depression.  His sleep is good, he has vivid dreams and some REM behavior but not worse than before.  He denies sleepwalking.  No paranoia.  There are no hygiene concerns, he is independent of bathing and dressing.  His medications are in a pillbox, given by his daughter.  They are also in charge of the finances for him.  His appetite is good, denies trouble swallowing.  He  does not cook.  He ambulates without difficulty, he only had 1 recent fall in the middle of the night due to loose socks.  He hit his head, but had no concussion.  He denies headaches, double vision, dizziness, focal weakness.  He has some numbness in the same area described back in March of this year, day median nerve region, which is bothersome to him, he is able to move his hand however.  He denies any urine incontinence, retention, constipation or diarrhea.  Jesus Jordan is a very pleasant 76 y.o. RH male  seen today in follow up for memory loss. This patient is accompanied in the office by his who supplements the history.  Previous records as well as any outside records available were reviewed prior to todays visit.  Patient was last seen at our office on  at which time his  Patient is currently on   Any changes in memory since last visit? Patient lives with: Spouse who noticed changes as well.  Patient lives alone repeats oneself?  Disoriented when walking into a room?  Patient denies   Leaving objects in unusual places?  Patient denies   Ambulates  with difficulty?   Patient denies   Recent falls?  Patient denies   Any head injuries?  Patient denies   History of seizures?   Patient denies  Wandering behavior?  Patient denies   Patient drives?   Patient no longer drives  Patient drives with assistance  Patient uses [employer] to drive   Any mood changes such irritability agitation?  Patient denies   Any history of depression?:  Patient denies   Hallucinations?  Patient denies   Paranoia?  Patient denies   Patient reports that he sleeps well without vivid dreams, REM behavior or sleepwalking   Patient reports vivid dreams   History of sleep apnea?  Patient denies   Any hygiene concerns?  Patient denies   Independent of bathing and dressing?  Endorsed  Does the patient needs help with medications?  pillbox pill pack  Patient denies   Who is in charge of the finances?  Patient is in charge   is  in charge   assists the patient  and denies missing any bills   occasionally misses a payment. Any changes in appetite?  Patient denies   Patient have trouble swallowing? Patient denies   Does the patient cook?  Patient denies   Any kitchen accidents such as leaving the stove on? Patient denies   Any headaches?  Patient denies   The double vision? Patient denies   Any focal numbness or tingling?  Patient denies   Chronic back pain Patient denies   Unilateral weakness?  Patient denies   Any tremors?  Patient denies   Any history of anosmia?  Patient denies   Any incontinence of urine?  Patient denies   Any bowel dysfunction?   Patient denies     Constipation     diarrhea    HISTORY OF PRESENT ILLNESS 12/07/2020: This is a pleasant 76 year old right-handed man with a history of hypertension, hyperlipidemia, CAD, COPD, alcohol abuse, presenting for evaluation of dementia. He underwent Neuropsychological evaluation in 05/2020 indicating Major Neurocognitive Disorder (ie dementia). Etiology difficult to determine, largely due to fairly diffuse nature of cognitive impairment. Alzheimer's disease is the most common form of neurodegenerative illness, and his profile does share many characteristics of this pattern. Lewy body dementia should remain on the differential, due to frontal subcortical dysfunction with pronounced visuospatial deficits, reports of potential visual hallucinations, and REM sleep behaviors. He also has a large number of cardiovascular ailments in his history with likely vascular contributions, making a "mixed dementia" presentation very likely. I personally reviewed MRI brain without contrast done 06/2020 which did not show any chronic microvascular disease. There was mild to moderate diffuse atrophy, no acute changes. He has been on Donepezil '10mg'$  qhs.    He feels his memory is "kind of screwed up a bit." [name] had been living in [place] and started noticing changes at least a year  ago, however COVID had made it hard to see him more often initially. When she visited, she noted a lot of confusion, standing and staring. He was drinking a 6-pack beer daily. She took over finances in November 2021 as she noticed bills were getting overwhelming, he was losing them/putting them down somewhere he could not find. When [place] visited, she discovered he was not taking his regular medications. He continues to drive short distances in the daytime and denies getting lost, [name] denies any issues with local driving. She moved in to stay with him in February 2022 and has been managing medications.She noted visual hallucinations in August 2021, thinking people were in the house or having a conversation with someone. This has quieted down, he still has both delusions that someone/family was there,  occurring around once a week. No paranoia. When he takes his medications consistently and on a routine, he does much better. He is independent with dressing and bathing. He is able to make his breakfast and has not left the stove on, no difficulties using the microwave. Sleep is overall good, sometimes he yells in his sleep or she sees him acting out his dreams. He sometimes wakes up almost in a cold sweat thinking he needs to do something due to vivid dreams. He is on mirtazapine '15mg'$  qhs, [name] notes that things are worse when he does not take it at night. There is a family history of dementia in his maternal grandparents and 2 sisters. No history of significant head injuries. They report he has definitely cut down on beer intake to one at night, alternating with coke.   He denies any headaches, dizziness, vision changes, neck/back pain, bladder dysfunction, anosmia, or tremors. He has some constipation. He has numbness in the right hand in a median nerve distribution. He had a fall while playing tennis in March, sustaining a hip fracture s/p fixation. He has been recovering well and ambulates without assistance. No  further falls.      Labs on 09/04/2020 shows TSH 0.402, B12 324 PREVIOUS MEDICATIONS:   CURRENT MEDICATIONS:  Outpatient Encounter Medications as of 12/02/2021  Medication Sig   allopurinol (ZYLOPRIM) 300 MG tablet TAKE ONE TABLET BY MOUTH EVERY MORNING   donepezil (ARICEPT) 10 MG tablet TAKE ONE TABLET BY MOUTH EVERY MORNING   doxycycline (VIBRA-TABS) 100 MG tablet Take 1 tablet (100 mg total) by mouth daily. For rosacea/continuous   ezetimibe-simvastatin (VYTORIN) 10-40 MG tablet Take 1 tablet by mouth daily.   metoprolol succinate (TOPROL-XL) 25 MG 24 hr tablet TAKE ONE TABLET BY MOUTH EVERY MORNING   mirtazapine (REMERON) 15 MG tablet TAKE ONE TABLET BY MOUTH EVERYDAY AT BEDTIME   Multiple Vitamin (MULTIVITAMIN WITH MINERALS) TABS tablet Take 1 tablet by mouth daily.   ondansetron (ZOFRAN) 4 MG tablet Take 1 tablet (4 mg total) by mouth every 6 (six) hours as needed for nausea.   polyethylene glycol (MIRALAX / GLYCOLAX) 17 g packet Take 17 g by mouth daily as needed for mild constipation.   QUEtiapine (SEROQUEL) 25 MG tablet Take 1 tablet (25 mg total) by mouth at bedtime.   senna-docusate (SENOKOT-S) 8.6-50 MG tablet Take 1 tablet by mouth 2 (two) times daily.   tetrahydrozoline-zinc (VISINE-AC) 0.05-0.25 % ophthalmic solution Place 2 drops into both eyes 3 (three) times daily as needed (for itching ot redness).    thiamine 100 MG tablet Take 1 tablet (100 mg total) by mouth daily.   No facility-administered encounter medications on file as of 12/02/2021.        No data to display             No data to display          Objective:     PHYSICAL EXAMINATION:    VITALS:  There were no vitals filed for this visit.  GEN:  The patient appears stated age and is in NAD. HEENT:  Normocephalic, atraumatic.   Neurological examination:  General: NAD, well-groomed, appears stated age. Orientation: The patient is alert. Oriented to person, place and date Cranial nerves: There  is good facial symmetry.The speech is fluent and clear. No aphasia or dysarthria. Fund of knowledge is appropriate. Recent and remote memory are impaired. Attention and concentration are reduced.  Able to name objects and repeat phrases.  Hearing is intact to conversational tone.    Sensation: Sensation is intact to light touch throughout Motor: Strength is at least antigravity x4. Tremors: none  DTR's 2/4 in UE/LE     Movement examination: Tone: There is normal tone in the UE/LE Abnormal movements:  no tremor.  No myoclonus.  No asterixis.   Coordination:  There is no decremation with RAM's. Normal finger to nose  Gait and Station: The patient has no difficulty arising out of a deep-seated chair without the use of the hands. The patient's stride length is good.  Gait is cautious and narrow.    Thank you for allowing [place] the opportunity to participate in the care of this nice patient. Please do not hesitate to contact us for any questions or concerns.   Total time spent on today's visit was *** minutes dedicated to this patient today, preparing to see patient, examining the patient, ordering tests and/or medications and counseling the patient, documenting clinical information in the EHR or other health record, independently interpreting results and communicating results to the patient/family, discussing treatment and goals, answering patient's questions and coordinating care.  Cc:  Eulas Post, MD  [name] 12/02/2021 9:04 AM

## 2021-12-07 ENCOUNTER — Ambulatory Visit: Payer: Medicare Other | Admitting: Neurology

## 2021-12-10 ENCOUNTER — Ambulatory Visit (INDEPENDENT_AMBULATORY_CARE_PROVIDER_SITE_OTHER): Payer: Medicare Other | Admitting: Physician Assistant

## 2021-12-10 ENCOUNTER — Encounter: Payer: Self-pay | Admitting: Physician Assistant

## 2021-12-10 VITALS — BP 92/56 | HR 68 | Resp 18 | Ht 70.0 in | Wt 155.0 lb

## 2021-12-10 DIAGNOSIS — F0280 Dementia in other diseases classified elsewhere without behavioral disturbance: Secondary | ICD-10-CM | POA: Diagnosis not present

## 2021-12-10 DIAGNOSIS — I2510 Atherosclerotic heart disease of native coronary artery without angina pectoris: Secondary | ICD-10-CM | POA: Diagnosis not present

## 2021-12-10 DIAGNOSIS — G309 Alzheimer's disease, unspecified: Secondary | ICD-10-CM | POA: Diagnosis not present

## 2021-12-10 MED ORDER — MEMANTINE HCL 10 MG PO TABS: ORAL_TABLET | ORAL | 11 refills | Status: DC

## 2021-12-13 DIAGNOSIS — L570 Actinic keratosis: Secondary | ICD-10-CM | POA: Diagnosis not present

## 2021-12-13 DIAGNOSIS — D485 Neoplasm of uncertain behavior of skin: Secondary | ICD-10-CM | POA: Diagnosis not present

## 2021-12-13 DIAGNOSIS — C4442 Squamous cell carcinoma of skin of scalp and neck: Secondary | ICD-10-CM | POA: Diagnosis not present

## 2021-12-13 DIAGNOSIS — Z85828 Personal history of other malignant neoplasm of skin: Secondary | ICD-10-CM | POA: Diagnosis not present

## 2021-12-16 ENCOUNTER — Other Ambulatory Visit: Payer: Self-pay | Admitting: Family Medicine

## 2021-12-17 ENCOUNTER — Telehealth: Payer: Self-pay | Admitting: Pharmacist

## 2021-12-17 NOTE — Chronic Care Management (AMB) (Unsigned)
Chronic Care Management Pharmacy Assistant   Name: Macedonio Scallon  MRN: [id number] DOB: 13-Apr-1946  Reason for Encounter: Medication Review / Medication Coordination Call  Recent office visits:  11/10/2021 [name] MD - Patient was seen for insomnia and additional issues. Started Quetiapine 15 mg at bedtime. Discontinued Tylenol, Folic acid and Tramadol. No follow up noted.  Recent consult visits:  12/10/2021 [name] PA-C (Neurology) - Patient was seen for Major neurocognitive disorder due to multiple etiologies without behavioral disturbance and an additional issue. Started Memantine 10 mg. Follow up in 6 months.  Hospital visits:  None  Medications: Outpatient Encounter Medications as of 12/17/2021  Medication Sig   allopurinol (ZYLOPRIM) 300 MG tablet TAKE ONE TABLET BY MOUTH EVERY MORNING   donepezil (ARICEPT) 10 MG tablet TAKE ONE TABLET BY MOUTH EVERY MORNING   doxycycline (VIBRA-TABS) 100 MG tablet Take 1 tablet (100 mg total) by mouth daily. For rosacea/continuous   ezetimibe-simvastatin (VYTORIN) 10-40 MG tablet Take 1 tablet by mouth daily.   memantine (NAMENDA) 10 MG tablet Take 1 tablet (10 mg at night) for 2 weeks, then increase to 1 tablet (10 mg) twice a day   metoprolol succinate (TOPROL-XL) 25 MG 24 hr tablet TAKE ONE TABLET BY MOUTH EVERY MORNING   mirtazapine (REMERON) 15 MG tablet TAKE ONE TABLET BY MOUTH EVERYDAY AT BEDTIME   Multiple Vitamin (MULTIVITAMIN WITH MINERALS) TABS tablet Take 1 tablet by mouth daily.   ondansetron (ZOFRAN) 4 MG tablet Take 1 tablet (4 mg total) by mouth every 6 (six) hours as needed for nausea.   polyethylene glycol (MIRALAX / GLYCOLAX) 17 g packet Take 17 g by mouth daily as needed for mild constipation.   QUEtiapine (SEROQUEL) 25 MG tablet Take 1 tablet (25 mg total) by mouth at bedtime.   senna-docusate (SENOKOT-S) 8.6-50 MG tablet Take 1 tablet by mouth 2 (two) times daily.   tetrahydrozoline-zinc (VISINE-AC) 0.05-0.25 %  ophthalmic solution Place 2 drops into both eyes 3 (three) times daily as needed (for itching ot redness).    thiamine 100 MG tablet Take 1 tablet (100 mg total) by mouth daily.   No facility-administered encounter medications on file as of 12/17/2021.  Reviewed chart for medication changes ahead of medication coordination call.  No OVs, Consults, or hospital visits since last care coordination call/Pharmacist visit. (If appropriate, list visit date, provider name)  No medication changes indicated OR if recent visit, treatment plan here.  BP Readings from Last 3 Encounters:  12/10/21 (!) 92/56  11/10/21 102/60  06/22/21 (!) 98/59    Lab Results  Component Value Date   HGBA1C  09/13/2008    5.8 (NOTE)   The ADA recommends the following therapeutic goal for glycemic   control related to Hgb A1C measurement:   Goal of Therapy:   < 7.0% Hgb A1C   Reference: American Diabetes Association: Clinical Practice   Recommendations 2008, Diabetes Care,  2008, 31:(Suppl 1).     Patient obtains medications through Adherence Packaging  30 Days    Last adherence delivery included:  Aspirin 81 mg - one tablet at bedtime Allopurinol (ZYLOPRIM) 300 mg - one tablet at breakfast Donepezil (ARICEPT) 10 mg - one tablet at breakfast Metoprolol succinate (TOPROL-XL) 25 MG - one tablet at breakfast Mirtazapine (Remeron) 15 mg - one tablet at bedtime Docusate 100 mg - one capsule at breakfast and one at bedtime     Patient declined (meds) last month due to PRN use. Fluticasone (CUTIVATE) 0.05 %  cream  Desoximetasone (TOPICORT) 0.25 % cream     Patient is due for next adherence delivery on: 12/30/2021   Called patient and reviewed medications and coordinated delivery.   This delivery to include: Aspirin 81 mg - one tablet at bedtime Docusate 100 mg - 1 capsule at breakfast and at bedtime Vytorin 10-40 mg - 1 tablet at breakfast Donepezil 10 mg - one tablet at breakfast Metoprolol succinate 25 MG -  one tablet at breakfast Allopurinol  300 mg - one tablet at breakfast Mirtazapine 15 mg - one tablet at bedtime   Patient will need a short fill:   Coordinated acute fill:   Patient declined the following medications:   Unable to confirm delivery (with daughter [name]) date of 12/30/2021.  Confirmed delivery date of 12/30/2021, advised patient that pharmacy will contact them the morning of delivery.   Care Gaps: AWV- previous message sent to [name]  Last BP - 92/56 on 12/10/2021 Shingrix - never done Covid booster - overdue Tdap - overdue Colonoscopy - overdue  Star Rating Drugs: Vytorin 10-40 mg  - last filled 11/25/2021 30 DS at [facility] Pharmacist Assistant [phone]

## 2021-12-22 ENCOUNTER — Telehealth: Payer: Self-pay | Admitting: Pharmacist

## 2021-12-22 NOTE — Telephone Encounter (Signed)
-----   Message from Rondel Jumbo, PA-C sent at 12/22/2021 12:52 PM EDT ----- Regarding: RE: Metoprolol question Indeed I cancelled by mistake and while trying to fix it, I could not. I left a message to the nurse. Needs to be restarted as per PCP. Thank you ----- Message ----- From: Viona Gilmore, [facility] Sent: 12/22/2021  11:57 AM EDT To: Rondel Jumbo, PA-C Subject: Metoprolol question                            Hi,  I am a pharmacist working in Mr. Thain PCP's office and help with medication management and coordination of medication delivery through his current pill packaging pharmacy. His daughter said you had discontinued his metoprolol when you saw him on 6/23 but I did not see that in your note and it is still on his medication list. I just wanted to confirm this with you prior to letting the pharmacy know it was stopped.  Please let me know either way! Thanks, Maddie

## 2021-12-22 NOTE — Telephone Encounter (Signed)
Per neurology, patient was not to discontinue the metoprolol. Will reach out to patient's daughter to let her know and consider dose decrease of metoprolol if still concerned for low readings or follow up visit with PCP.

## 2021-12-22 NOTE — Chronic Care Management (AMB) (Signed)
Spoke with patients daughter [name], she was notified patient is to continue taking Metoprolol and monitor his blood pressures. If his blood pressures continue to be low or she has any concerns to contact Dr [name] and he may lower the dose.  [name] voices clear understanding and will restart Metoprolol.

## 2022-01-18 ENCOUNTER — Telehealth: Payer: Self-pay | Admitting: Pharmacist

## 2022-01-18 NOTE — Chronic Care Management (AMB) (Signed)
Chronic Care Management Pharmacy Assistant   Name: Jesus Jordan  MRN: [id number] DOB: 02-10-1946  Reason for Encounter: Disease State and Medication Review / Hypertension Assessment and Medication Coordination Call   Conditions to be addressed/monitored: HTN  Recent office visits:  None  Recent consult visits:  None  Hospital visits:  None  Medications: Outpatient Encounter Medications as of 01/18/2022  Medication Sig   allopurinol (ZYLOPRIM) 300 MG tablet TAKE ONE TABLET BY MOUTH EVERY MORNING   donepezil (ARICEPT) 10 MG tablet TAKE ONE TABLET BY MOUTH EVERY MORNING   doxycycline (VIBRA-TABS) 100 MG tablet Take 1 tablet (100 mg total) by mouth daily. For rosacea/continuous   ezetimibe-simvastatin (VYTORIN) 10-40 MG tablet Take 1 tablet by mouth daily.   memantine (NAMENDA) 10 MG tablet Take 1 tablet (10 mg at night) for 2 weeks, then increase to 1 tablet (10 mg) twice a day   metoprolol succinate (TOPROL-XL) 25 MG 24 hr tablet TAKE ONE TABLET BY MOUTH EVERY MORNING   mirtazapine (REMERON) 15 MG tablet TAKE ONE TABLET BY MOUTH EVERYDAY AT BEDTIME   Multiple Vitamin (MULTIVITAMIN WITH MINERALS) TABS tablet Take 1 tablet by mouth daily.   ondansetron (ZOFRAN) 4 MG tablet Take 1 tablet (4 mg total) by mouth every 6 (six) hours as needed for nausea.   polyethylene glycol (MIRALAX / GLYCOLAX) 17 g packet Take 17 g by mouth daily as needed for mild constipation.   QUEtiapine (SEROQUEL) 25 MG tablet Take 1 tablet (25 mg total) by mouth at bedtime.   senna-docusate (SENOKOT-S) 8.6-50 MG tablet Take 1 tablet by mouth 2 (two) times daily.   tetrahydrozoline-zinc (VISINE-AC) 0.05-0.25 % ophthalmic solution Place 2 drops into both eyes 3 (three) times daily as needed (for itching ot redness).    thiamine 100 MG tablet Take 1 tablet (100 mg total) by mouth daily.   No facility-administered encounter medications on file as of 01/18/2022.  Reviewed chart prior to disease state call. Spoke with  patient regarding BP  Recent Office Vitals: BP Readings from Last 3 Encounters:  12/10/21 (!) 92/56  11/10/21 102/60  06/22/21 (!) 98/59   Pulse Readings from Last 3 Encounters:  12/10/21 68  11/10/21 60  06/22/21 71    Wt Readings from Last 3 Encounters:  12/10/21 155 lb (70.3 kg)  11/10/21 155 lb 4.8 oz (70.4 kg)  06/22/21 155 lb 9.6 oz (70.6 kg)     Kidney Function Lab Results  Component Value Date/Time   CREATININE 0.96 09/09/2020 05:33 AM   CREATININE 0.85 09/07/2020 07:22 AM   CREATININE 0.90 02/10/2020 11:48 AM   CREATININE 1.07 03/18/2014 10:51 AM   GFR 95.90 07/10/2017 03:45 PM   GFRNONAA >60 09/09/2020 05:33 AM   GFRAA >60 08/28/2019 03:54 PM       Latest Ref Rng & Units 09/09/2020    5:33 AM 09/07/2020    7:22 AM 09/06/2020    5:26 AM  BMP  Glucose 70 - 99 mg/dL 99  103  101   BUN 8 - 23 mg/dL '25  24  29   '$ Creatinine 0.61 - 1.24 mg/dL 0.96  0.85  0.79   Sodium 135 - 145 mmol/L 135  136  138   Potassium 3.5 - 5.1 mmol/L 3.9  3.7  3.9   Chloride 98 - 111 mmol/L 103  102  104   CO2 22 - 32 mmol/L '26  26  23   '$ Calcium 8.9 - 10.3 mg/dL 8.8  9.1  9.0  BP Readings from Last 3 Encounters:  12/10/21 (!) 92/56  11/10/21 102/60  06/22/21 (!) 98/59    Lab Results  Component Value Date   HGBA1C  09/13/2008    5.8 (NOTE)   The ADA recommends the following therapeutic goal for glycemic   control related to Hgb A1C measurement:   Goal of Therapy:   < 7.0% Hgb A1C   Reference: American Diabetes Association: Clinical Practice   Recommendations 2008, Diabetes Care,  2008, 31:(Suppl 1).    Current antihypertensive regimen:  Toprol XL 25 mg every morning  How often are you checking your Blood Pressure? Patients daughter is checking his blood pressure twice daily, the readings are always in the same range.   Current home BP readings: 01/17/22 - 91/60 and 109/62                                                    01/16/22 - 91/60                                                     01/15/22 - 101/61 and 100/62                                                    01/14/22 - 95/61 and 93/60  What recent interventions/DTPs have been made by any provider to improve Blood Pressure control since last CPP Visit:  Patient is to continue taking Metoprolol and monitor his blood pressures. If his blood pressures continue to be low or she has any concerns to contact Dr [name] and he may lower the dose  Any recent hospitalizations or ED visits since last visit with CPP? No recent hospital visits.   What diet changes have been made to improve Blood Pressure Control?  Patient follows no specific diet Breakfast - he will have boost, cereal or eggs with sausage Lunch - he will have a sandwich Dinner - he will have a meat and vegetable, he does not eat any starches, he does not like them.   What exercise is being done to improve your Blood Pressure Control?  Patient walks when the weather is nice   Adherence Review: Is the patient currently on ACE/ARB medication? Yes Does the patient have >5 day gap between last estimated fill dates? No    Patient obtains medications through Adherence Packaging  30 Days    Last adherence delivery included:  Aspirin 81 mg - one tablet at bedtime Docusate 100 mg - 1 capsule at breakfast and at bedtime Vytorin 10-40 mg - 1 tablet at breakfast Donepezil 10 mg - one tablet at breakfast Allopurinol  300 mg - one tablet at breakfast Mirtazapine 15 mg - one tablet at bedtime Memantine 10 mg - one at breakfast and bedtime Seroquel 25 mg - one tablet at bedtime (vial)    Patient declined  last month:     Patient is due for next adherence delivery on: 01/31/2022   Called patient and reviewed medications and coordinated delivery.   This delivery to  include: Aspirin 81 mg - one tablet at bedtime Docusate 100 mg - 1 capsule at breakfast and at bedtime Vytorin 10-40 mg - 1 tablet at breakfast Donepezil 10 mg - one tablet at  breakfast Metoprolol 25 mg - 1 tablet at breakfast Allopurinol  300 mg - one tablet at breakfast (patients daughter is requesting this to be in a vial) Mirtazapine 15 mg - one tablet at bedtime Memantine 10 mg - one at breakfast and bedtime Seroquel 25 mg - one tablet at bedtime (vial)   Patient will need a short fill:No short fills needed   Coordinated acute fill: No acute fills needed   Patient declined the following medications: No medications declined   Confirmed delivery date of 01/31/2022, advised patient's daughter Jesus Jordan that pharmacy will contact them the morning of delivery.   Note: Patients daughter has requested Allopurinol to come in a vial, patient has not had issues with gout in years and his diet has changed, she plans to discuss this medication with Dr. [name].   Care Gaps: AWV- previous message sent to [name]  Last BP - 92/56 on 12/10/2021 Shingrix - never done Covid booster - overdue Tdap - overdue Colonoscopy - overdue Flu - due  Star Rating Drugs: Vytorin 10-40 mg  - last filled 12/24/2021 30 DS at [facility] [phone]

## 2022-01-23 ENCOUNTER — Other Ambulatory Visit: Payer: Self-pay | Admitting: Family Medicine

## 2022-01-28 ENCOUNTER — Ambulatory Visit: Payer: Medicare Other

## 2022-01-28 DIAGNOSIS — Z515 Encounter for palliative care: Secondary | ICD-10-CM

## 2022-01-28 NOTE — Progress Notes (Signed)
PALLIATIVE CARE SW TELEPHONE CONTACT   Palliative care SW outreached patient to complete telephonic visit.    [name], [name], provided update on medical condition and/or changes.  Daughter endorses that patient is overall doing well, but has experienced some changes and/or declines since last PC check in due to disease progression.     Hospitalizations: None recently   Appetite: remains good. No significant weight changes noted.    Medications: stated Seroquel nearly 3 months ago, which is helping patient sleep more throughout the night.    Psychosocial assessment: No financial, food insecurities or issues with transportation noted. No other psychosocial needs. Daughter shared that there was an open APS case that has since recently closed, details of case was not provided. No S/S of depression or anxiety reported. Patient remains confused at baseline.  In home support: Patient has private caregivers 3x/wk. Patient will transition going back to [facility] at a later date.    Next PC visit:  scheduled with PC NP for 02/23/22 @ 1130 AM. (Review GOC/MOST form)   Palliative care will continue to monitor and assist with long term care planning as needed.Marland Kitchen

## 2022-02-11 ENCOUNTER — Encounter: Payer: Self-pay | Admitting: Family Medicine

## 2022-02-17 ENCOUNTER — Telehealth: Payer: Self-pay | Admitting: Pharmacist

## 2022-02-17 DIAGNOSIS — L570 Actinic keratosis: Secondary | ICD-10-CM | POA: Diagnosis not present

## 2022-02-17 DIAGNOSIS — Z85828 Personal history of other malignant neoplasm of skin: Secondary | ICD-10-CM | POA: Diagnosis not present

## 2022-02-17 DIAGNOSIS — C4442 Squamous cell carcinoma of skin of scalp and neck: Secondary | ICD-10-CM | POA: Diagnosis not present

## 2022-02-17 DIAGNOSIS — D485 Neoplasm of uncertain behavior of skin: Secondary | ICD-10-CM | POA: Diagnosis not present

## 2022-02-17 NOTE — Chronic Care Management (AMB) (Signed)
Chronic Care Management Pharmacy Assistant   Name: Calob Baskette  MRN: [id number] DOB: 22-May-1946  Reason for Encounter: Medication Review / Medication Coordination Call   Recent office visits:  None  Recent consult visits:  01/28/2022 [name] LCSW (palliative care) - patient was seen for palliative care encounter. No medication changes.   Hospital visits:  None  Medications: Outpatient Encounter Medications as of 02/17/2022  Medication Sig   allopurinol (ZYLOPRIM) 300 MG tablet TAKE ONE TABLET BY MOUTH EVERY MORNING   donepezil (ARICEPT) 10 MG tablet TAKE ONE TABLET BY MOUTH EVERY MORNING   doxycycline (VIBRA-TABS) 100 MG tablet Take 1 tablet (100 mg total) by mouth daily. For rosacea/continuous   ezetimibe-simvastatin (VYTORIN) 10-40 MG tablet Take 1 tablet by mouth daily.   memantine (NAMENDA) 10 MG tablet Take 1 tablet (10 mg at night) for 2 weeks, then increase to 1 tablet (10 mg) twice a day   metoprolol succinate (TOPROL-XL) 25 MG 24 hr tablet TAKE ONE TABLET BY MOUTH EVERY MORNING   mirtazapine (REMERON) 15 MG tablet TAKE ONE TABLET BY MOUTH EVERYDAY AT BEDTIME   Multiple Vitamin (MULTIVITAMIN WITH MINERALS) TABS tablet Take 1 tablet by mouth daily.   ondansetron (ZOFRAN) 4 MG tablet Take 1 tablet (4 mg total) by mouth every 6 (six) hours as needed for nausea.   polyethylene glycol (MIRALAX / GLYCOLAX) 17 g packet Take 17 g by mouth daily as needed for mild constipation.   QUEtiapine (SEROQUEL) 25 MG tablet Take 1 tablet (25 mg total) by mouth at bedtime.   senna-docusate (SENOKOT-S) 8.6-50 MG tablet Take 1 tablet by mouth 2 (two) times daily.   tetrahydrozoline-zinc (VISINE-AC) 0.05-0.25 % ophthalmic solution Place 2 drops into both eyes 3 (three) times daily as needed (for itching ot redness).    thiamine 100 MG tablet Take 1 tablet (100 mg total) by mouth daily.   No facility-administered encounter medications on file as of 02/17/2022.   Reviewed chart for medication  changes ahead of medication coordination call.  BP Readings from Last 3 Encounters:  12/10/21 (!) 92/56  11/10/21 102/60  06/22/21 (!) 98/59    Lab Results  Component Value Date   HGBA1C  09/13/2008    5.8 (NOTE)   The ADA recommends the following therapeutic goal for glycemic   control related to Hgb A1C measurement:   Goal of Therapy:   < 7.0% Hgb A1C   Reference: American Diabetes Association: Clinical Practice   Recommendations 2008, Diabetes Care,  2008, 31:(Suppl 1).     Patient obtains medications through Adherence Packaging  30 Days    Last adherence delivery included:  Aspirin 81 mg - one tablet at bedtime Docusate 100 mg - 1 capsule at breakfast and at bedtime Vytorin 10-40 mg - 1 tablet at breakfast Donepezil 10 mg - one tablet at breakfast Metoprolol 25 mg - 1 tablet at breakfast Allopurinol  300 mg - one tablet at breakfast (patients daughter is requesting this to be in a vial) Mirtazapine 15 mg - one tablet at bedtime Memantine 10 mg - one at breakfast and bedtime Seroquel 25 mg - one tablet at bedtime (vial)    Patient declined  last month:     Patient is due for next adherence delivery on: 03/01/2022   Called patient and reviewed medications and coordinated delivery.   This delivery to include: Aspirin 81 mg - one tablet at bedtime Docusate 100 mg - 1 capsule at breakfast and at bedtime Vytorin 10-40 mg -  1 tablet at breakfast Seroquel 25 mg - one tablet at bedtime (vial) Donepezil 10 mg - one tablet at breakfast Metoprolol 25 mg - 1 tablet at breakfast Allopurinol  300 mg - one tablet at breakfast (vial) Mirtazapine 15 mg - one tablet at bedtime Memantine 10 mg - one at breakfast and bedtime   Patient will need a short fill:No short fills needed   Coordinated acute fill: No acute fills needed   Patient declined the following medications: No medications declined   Unable to confirmed delivery date of 03/01/2022 after several attempts.    Care  Gaps: AWV- previous message to [name]  Last BP - 92/56 on 12/10/2021 Shingrix - never done Covid booster - overdue Tdap - overdue Colonoscopy - overdue Flu - due  Star Rating Drugs: Vytorin 10-40 mg  - last filled 01/25/2022 30 DS at [place] [phone]

## 2022-02-22 DIAGNOSIS — Z85828 Personal history of other malignant neoplasm of skin: Secondary | ICD-10-CM | POA: Diagnosis not present

## 2022-02-22 DIAGNOSIS — C4442 Squamous cell carcinoma of skin of scalp and neck: Secondary | ICD-10-CM | POA: Diagnosis not present

## 2022-02-23 ENCOUNTER — Other Ambulatory Visit: Payer: Medicare Other | Admitting: Family Medicine

## 2022-03-10 ENCOUNTER — Other Ambulatory Visit: Payer: Medicare Other | Admitting: Family Medicine

## 2022-03-18 ENCOUNTER — Other Ambulatory Visit: Payer: Self-pay | Admitting: Family Medicine

## 2022-03-21 ENCOUNTER — Telehealth: Payer: Self-pay | Admitting: Pharmacist

## 2022-03-21 NOTE — Chronic Care Management (AMB) (Unsigned)
Chronic Care Management Pharmacy Assistant   Name: Jesus Jordan  MRN: [id number] DOB: 1945/09/20  Reason for Encounter: Medication Review / Medication Coordination Call  Recent office visits:  None  Recent consult visits:  None  Hospital visits:  None  Medications: Outpatient Encounter Medications as of 03/21/2022  Medication Sig   allopurinol (ZYLOPRIM) 300 MG tablet TAKE ONE TABLET BY MOUTH EVERY MORNING   donepezil (ARICEPT) 10 MG tablet TAKE ONE TABLET BY MOUTH EVERY MORNING   doxycycline (VIBRA-TABS) 100 MG tablet Take 1 tablet (100 mg total) by mouth daily. For rosacea/continuous   ezetimibe-simvastatin (VYTORIN) 10-40 MG tablet Take 1 tablet by mouth daily.   memantine (NAMENDA) 10 MG tablet Take 1 tablet (10 mg at night) for 2 weeks, then increase to 1 tablet (10 mg) twice a day   metoprolol succinate (TOPROL-XL) 25 MG 24 hr tablet TAKE ONE TABLET BY MOUTH EVERY MORNING   mirtazapine (REMERON) 15 MG tablet TAKE ONE TABLET BY MOUTH EVERYDAY AT BEDTIME   Multiple Vitamin (MULTIVITAMIN WITH MINERALS) TABS tablet Take 1 tablet by mouth daily.   ondansetron (ZOFRAN) 4 MG tablet Take 1 tablet (4 mg total) by mouth every 6 (six) hours as needed for nausea.   polyethylene glycol (MIRALAX / GLYCOLAX) 17 g packet Take 17 g by mouth daily as needed for mild constipation.   QUEtiapine (SEROQUEL) 25 MG tablet Take 1 tablet (25 mg total) by mouth at bedtime.   senna-docusate (SENOKOT-S) 8.6-50 MG tablet Take 1 tablet by mouth 2 (two) times daily.   tetrahydrozoline-zinc (VISINE-AC) 0.05-0.25 % ophthalmic solution Place 2 drops into both eyes 3 (three) times daily as needed (for itching ot redness).    thiamine 100 MG tablet Take 1 tablet (100 mg total) by mouth daily.   No facility-administered encounter medications on file as of 03/21/2022.   Reviewed chart for medication changes ahead of medication coordination call.  No OVs, Consults, or hospital visits since last care coordination  call/Pharmacist visit. (If appropriate, list visit date, provider name)  No medication changes indicated OR if recent visit, treatment plan here.  BP Readings from Last 3 Encounters:  12/10/21 (!) 92/56  11/10/21 102/60  06/22/21 (!) 98/59    Lab Results  Component Value Date   HGBA1C  09/13/2008    5.8 (NOTE)   The ADA recommends the following therapeutic goal for glycemic   control related to Hgb A1C measurement:   Goal of Therapy:   < 7.0% Hgb A1C   Reference: American Diabetes Association: Clinical Practice   Recommendations 2008, Diabetes Care,  2008, 31:(Suppl 1).     Patient obtains medications through Adherence Packaging  30 Days    Last adherence delivery included:  Aspirin 81 mg - one tablet at bedtime Docusate 100 mg - 1 capsule at breakfast and at bedtime Vytorin 10-40 mg - 1 tablet at breakfast Seroquel 25 mg - one tablet at bedtime (vial) Donepezil 10 mg - one tablet at breakfast Metoprolol 25 mg - 1 tablet at breakfast Allopurinol  300 mg - one tablet at breakfast (vial) Mirtazapine 15 mg - one tablet at bedtime Memantine 10 mg - one at breakfast and bedtime    Patient declined  last month:      Patient is due for next adherence delivery on: 03/31/2022   Called patient and reviewed medications and coordinated delivery.  SCHEDULE FOLLOW UP This delivery to include: Aspirin 81 mg - one tablet at bedtime Docusate 100 mg - 1 capsule  at breakfast and at bedtime Vytorin 10-40 mg - 1 tablet at breakfast Seroquel 25 mg - one tablet at bedtime (vial) Donepezil 10 mg - one tablet at breakfast Metoprolol 25 mg - 1 tablet at breakfast Mirtazapine 15 mg - one tablet at bedtime Memantine 10 mg - one at breakfast and bedtime   Patient will need a short fill:No short fills needed   Coordinated acute fill: No acute fills needed   Patient declined the following medications:    Unable to confirmed delivery date of 03/31/2022 after several attempts.    Confirmed  delivery date of 03/31/2022, advised patient that pharmacy will contact them the morning of delivery.   Care Gaps: AWV- 08/06/21 message to Jesus Jordan  Last BP - 92/56 on 12/10/2021 Shingrix - never done Covid  - overdue Tdap - overdue Colonoscopy - overdue Flu - due  Star Rating Drugs: Vytorin 10-40 mg  - last filled 01/25/2022 30 DS at [facility]  SIG***

## 2022-03-28 ENCOUNTER — Encounter: Payer: Self-pay | Admitting: Family Medicine

## 2022-03-29 ENCOUNTER — Other Ambulatory Visit: Payer: Self-pay

## 2022-03-29 DIAGNOSIS — F0280 Dementia in other diseases classified elsewhere without behavioral disturbance: Secondary | ICD-10-CM

## 2022-03-29 MED ORDER — QUETIAPINE FUMARATE 25 MG PO TABS: 25.0000 mg | ORAL_TABLET | Freq: Two times a day (BID) | ORAL | 0 refills | Status: DC

## 2022-03-29 MED ORDER — QUETIAPINE FUMARATE 25 MG PO TABS: 25.0000 mg | ORAL_TABLET | Freq: Two times a day (BID) | ORAL | 1 refills | Status: DC

## 2022-03-29 NOTE — Addendum Note (Signed)
Addended by: Otilio Miu on: 03/29/2022 04:10 PM   Modules accepted: Orders

## 2022-04-18 ENCOUNTER — Other Ambulatory Visit: Payer: Self-pay | Admitting: Family Medicine

## 2022-04-19 ENCOUNTER — Telehealth: Payer: Self-pay | Admitting: Pharmacist

## 2022-04-19 NOTE — Chronic Care Management (AMB) (Unsigned)
Chronic Care Management Pharmacy Assistant   Name: Jesus Jordan  MRN: [id number] DOB: 01/18/1946  Reason for Encounter: Medication Review / Medication Coordination Call   Recent office visits:  None   Recent consult visits:  None  Hospital visits:  None  Medications: Outpatient Encounter Medications as of 04/19/2022  Medication Sig   allopurinol (ZYLOPRIM) 300 MG tablet TAKE ONE TABLET BY MOUTH EVERY MORNING   donepezil (ARICEPT) 10 MG tablet TAKE ONE TABLET BY MOUTH EVERY MORNING   doxycycline (VIBRA-TABS) 100 MG tablet Take 1 tablet (100 mg total) by mouth daily. For rosacea/continuous   ezetimibe-simvastatin (VYTORIN) 10-40 MG tablet Take 1 tablet by mouth daily.   memantine (NAMENDA) 10 MG tablet Take 1 tablet (10 mg at night) for 2 weeks, then increase to 1 tablet (10 mg) twice a day   metoprolol succinate (TOPROL-XL) 25 MG 24 hr tablet TAKE ONE TABLET BY MOUTH EVERY MORNING   mirtazapine (REMERON) 15 MG tablet TAKE ONE TABLET BY MOUTH EVERYDAY AT BEDTIME   Multiple Vitamin (MULTIVITAMIN WITH MINERALS) TABS tablet Take 1 tablet by mouth daily.   ondansetron (ZOFRAN) 4 MG tablet Take 1 tablet (4 mg total) by mouth every 6 (six) hours as needed for nausea.   polyethylene glycol (MIRALAX / GLYCOLAX) 17 g packet Take 17 g by mouth daily as needed for mild constipation.   QUEtiapine (SEROQUEL) 25 MG tablet Take 1 tablet (25 mg total) by mouth 2 (two) times daily.   senna-docusate (SENOKOT-S) 8.6-50 MG tablet Take 1 tablet by mouth 2 (two) times daily.   tetrahydrozoline-zinc (VISINE-AC) 0.05-0.25 % ophthalmic solution Place 2 drops into both eyes 3 (three) times daily as needed (for itching ot redness).    thiamine 100 MG tablet Take 1 tablet (100 mg total) by mouth daily.   No facility-administered encounter medications on file as of 04/19/2022.   Reviewed chart for medication changes ahead of medication coordination call.  No OVs, Consults, or hospital visits since last care  coordination call/Pharmacist visit. (If appropriate, list visit date, provider name)  No medication changes indicated OR if recent visit, treatment plan here.  BP Readings from Last 3 Encounters:  12/10/21 (!) 92/56  11/10/21 102/60  06/22/21 (!) 98/59    Lab Results  Component Value Date   HGBA1C  09/13/2008    5.8 (NOTE)   The ADA recommends the following therapeutic goal for glycemic   control related to Hgb A1C measurement:   Goal of Therapy:   < 7.0% Hgb A1C   Reference: American Diabetes Association: Clinical Practice   Recommendations 2008, Diabetes Care,  2008, 31:(Suppl 1).     Patient obtains medications through Adherence Packaging  30 Days    Last adherence delivery included:  Aspirin 81 mg - one tablet at bedtime Docusate 100 mg - 1 capsule at breakfast and at bedtime Vytorin 10-40 mg - 1 tablet at breakfast Seroquel 25 mg - one tablet at bedtime (vial) Donepezil 10 mg - one tablet at breakfast Metoprolol 25 mg - 1 tablet at breakfast Mirtazapine 15 mg - one tablet at bedtime Memantine 10 mg - one at breakfast and bedtime    Patient declined  last month:     Patient is due for next adherence delivery on: 04/29/2022   Called patient and reviewed medications and coordinated delivery. SCHEDULE FOLLOW UP  This delivery to include: Aspirin 81 mg - one tablet at bedtime Docusate 100 mg - 1 capsule at breakfast and at bedtime Vytorin 10-40  mg - 1 tablet at breakfast Seroquel 25 mg - one tablet at bedtime (vial) Donepezil 10 mg - one tablet at breakfast Metoprolol 25 mg - 1 tablet at breakfast Mirtazapine 15 mg - one tablet at bedtime Memantine 10 mg - one at breakfast and bedtime Allopurinol 300 mg - 1 tablet every morning   Patient will need a short fill:No short fills needed   Coordinated acute fill: No acute fills needed   Patient declined the following medications:    Unable to confirmed delivery date of 04/29/2022 after several attempts.  Confirmed  delivery date of 04/29/2022, advised patient that pharmacy will contact them the morning of delivery.   Care Gaps: AWV- 08/06/21 message to [name]  Last BP - 92/56 on 12/10/2021 Shingrix - never done Covid  - overdue Tdap - overdue Colonoscopy - overdue Flu - due  Star Rating Drugs: Vytorin 10-40 mg  - last filled 03/29/2022 30 DS at [place] [phone]

## 2022-04-27 ENCOUNTER — Telehealth: Payer: Self-pay

## 2022-04-27 DIAGNOSIS — F0280 Dementia in other diseases classified elsewhere without behavioral disturbance: Secondary | ICD-10-CM

## 2022-04-27 MED ORDER — QUETIAPINE FUMARATE 25 MG PO TABS: 25.0000 mg | ORAL_TABLET | Freq: Two times a day (BID) | ORAL | 1 refills | Status: DC

## 2022-04-27 NOTE — Telephone Encounter (Signed)
-----   Message from Viona Gilmore, [facility] sent at 04/27/2022 12:07 PM EST ----- Regarding: Quetiapine refill Hi,  Mr. Calleros gets his medications prepackaged through [facility] and wanted to add the updated dose of quetiapine to his other prepackaged medications. Can you please send a refill to [facility]? It looks like the last rx was sent to [facility] and that's why it wasn't sent with the rest of his medications. He is almost out.  Thanks! Maddie

## 2022-04-27 NOTE — Telephone Encounter (Signed)
Rx sent 

## 2022-05-17 ENCOUNTER — Other Ambulatory Visit: Payer: Self-pay | Admitting: Cardiovascular Disease

## 2022-05-18 ENCOUNTER — Telehealth: Payer: Self-pay | Admitting: Pharmacist

## 2022-05-18 NOTE — Chronic Care Management (AMB) (Signed)
Chronic Care Management Pharmacy Assistant   Name: Jesus Jordan  MRN: [id number] DOB: [birth year]  Reason for Encounter: Medication Review / Medication Coordination Call   Recent office visits:  None  Recent consult visits:  None  Hospital visits:  None  Medications: Outpatient Encounter Medications as of 05/18/2022  Medication Sig   allopurinol (ZYLOPRIM) 300 MG tablet TAKE ONE TABLET BY MOUTH EVERY MORNING   donepezil (ARICEPT) 10 MG tablet TAKE ONE TABLET BY MOUTH EVERY MORNING   doxycycline (VIBRA-TABS) 100 MG tablet Take 1 tablet (100 mg total) by mouth daily. For rosacea/continuous   ezetimibe-simvastatin (VYTORIN) 10-40 MG tablet Take 1 tablet by mouth daily.   memantine (NAMENDA) 10 MG tablet Take 1 tablet (10 mg at night) for 2 weeks, then increase to 1 tablet (10 mg) twice a day   metoprolol succinate (TOPROL-XL) 25 MG 24 hr tablet TAKE ONE TABLET BY MOUTH EVERY MORNING   mirtazapine (REMERON) 15 MG tablet TAKE ONE TABLET BY MOUTH EVERYDAY AT BEDTIME   Multiple Vitamin (MULTIVITAMIN WITH MINERALS) TABS tablet Take 1 tablet by mouth daily.   ondansetron (ZOFRAN) 4 MG tablet Take 1 tablet (4 mg total) by mouth every 6 (six) hours as needed for nausea.   polyethylene glycol (MIRALAX / GLYCOLAX) 17 g packet Take 17 g by mouth daily as needed for mild constipation.   QUEtiapine (SEROQUEL) 25 MG tablet Take 1 tablet (25 mg total) by mouth 2 (two) times daily.   senna-docusate (SENOKOT-S) 8.6-50 MG tablet Take 1 tablet by mouth 2 (two) times daily.   tetrahydrozoline-zinc (VISINE-AC) 0.05-0.25 % ophthalmic solution Place 2 drops into both eyes 3 (three) times daily as needed (for itching ot redness).    thiamine 100 MG tablet Take 1 tablet (100 mg total) by mouth daily.   No facility-administered encounter medications on file as of 05/18/2022.   Reviewed chart for medication changes ahead of medication coordination call.  BP Readings from Last 3 Encounters:  12/10/21 (!)  92/56  11/10/21 102/60  06/22/21 (!) 98/59    Lab Results  Component Value Date   HGBA1C  09/13/2008    5.8 (NOTE)   The ADA recommends the following therapeutic goal for glycemic   control related to Hgb A1C measurement:   Goal of Therapy:   < 7.0% Hgb A1C   Reference: American Diabetes Association: Clinical Practice   Recommendations 2008, Diabetes Care,  2008, 31:(Suppl 1).     Patient obtains medications through Adherence Packaging  30 Days    Last adherence delivery included:  Aspirin 81 mg - one tablet at bedtime Docusate 100 mg - 1 capsule at breakfast and at bedtime Vytorin 10-40 mg - 1 tablet at breakfast Seroquel 25 mg - one tablet at bedtime (vial) Donepezil 10 mg - one tablet at breakfast Metoprolol 25 mg - 1 tablet at breakfast Memantine 10 mg - one at breakfast and bedtime Mirtazapine 15 mg - one tablet at bedtime    Patient declined  last month:  Allopurinol 300 mg - 1 tablet every morning - pt has plenty on hand    Patient is due for next adherence delivery on: 05/30/2022   Called patient and reviewed medications and coordinated delivery.   This delivery to include: Aspirin 81 mg - one tablet at bedtime Docusate 100 mg - 1 capsule at breakfast and at bedtime Vytorin 10-40 mg - 1 tablet at breakfast Seroquel 25 mg - one tablet twice daily(vial) Donepezil 10 mg - one tablet at  breakfast Metoprolol 25 mg - 1 tablet at breakfast Memantine 10 mg - one at breakfast and bedtime Mirtazapine 15 mg - one tablet at bedtime   Patient will need a short fill:No short fills needed   Coordinated acute fill: No acute fills needed   Patient declined the following medications: Allopurinol 300 mg - 1 tablet every morning - pt has plenty on hand   Unable to confirmed delivery date of 05/30/2022 after several attempts  Care Gaps: AWV- 05/18/22 message to [name]  Last BP - 92/56 on 12/10/2021 Shingrix - never done AWV - overdue Covid - overdue Colonoscopy -  overdue Flu - due  Star Rating Drugs: Vytorin 10-40 mg  - last filled 04/26/2022 30 DS at [place] [phone]

## 2022-05-19 ENCOUNTER — Other Ambulatory Visit: Payer: Self-pay | Admitting: Family Medicine

## 2022-05-19 DIAGNOSIS — F0280 Dementia in other diseases classified elsewhere without behavioral disturbance: Secondary | ICD-10-CM

## 2022-06-15 ENCOUNTER — Ambulatory Visit: Payer: Medicare Other | Admitting: Physician Assistant

## 2022-06-15 NOTE — Progress Notes (Incomplete)
Assessment/Plan:   Memory Impairment Jesus Jordan is a very pleasant 76 y.o. RH male with a history of hypertension, hyperlipidemia, CAD, COPD, alcohol abuse seen today in follow up for memory loss. Patient is currently on donepezil 10 mg daily and memantine 10 mg twice daily, tolerating well.  Prior MRI brain personally reviewed was remarkable for moderate diffuse atrophy, without acute changes.      Follow up in   months. Continue donepezil 10 mg daily and memantine 10 mg twice daily, side effects discussed Continue to control mood by PCP, he is on Seroquel and Remeron. Continue to control cardiovascular risk factors    Subjective:    This patient is accompanied in the office by his daughter*** who supplements the history.  Previous records as well as any outside records available were reviewed prior to todays visit.  He was last seen on 12/10/2021.  He is on donepezil 10 mg daily and memantine 10 mg twice daily.   Any changes in memory since last visit?  "It is harder for him to find the words that he was reaching for, he is tangential in his thoughts ". repeats oneself?  Endorsed Disoriented when walking into a room?  Endorsed.  Sometimes he does not recognize his house as his home. Leaving objects in unusual places?  "All the time, wallet in the fridge, meeting the cabinet, etc. ".***  Wandering behavior?  He likes to go outside and look at the trees*** Any personality changes since last visit?  Patient's mood is well-controlled with current medications. Any worsening depression?:  Patient denies   Hallucinations or paranoia?  Seroquel helps with visual hallucination.*** Seizures?   Patient denies    Any sleep changes?  Denies vivid dreams, REM behavior or sleepwalking   Sleep apnea?  Patient denies   Any hygiene concerns?  Patient denies   Independent of bathing and dressing?  Endorsed  Does the patient needs help with medications?  Daughter is in charge *** Who is in charge  of the finances?  Daughter is in charge   *** Any changes in appetite?  Patient denies ***   Patient have trouble swallowing? Patient denies   Does the patient cook?  Any kitchen accidents such as leaving the stove on? Patient denies   Any headaches?  Patient denies   Chronic back pain Patient denies   Ambulates with difficulty?   Patient denies   Recent falls or head injuries?  Patient denies     Unilateral weakness, numbness or tingling?  Patient denies   Any tremors?  Patient denies   Any anosmia?  Patient denies   Any incontinence of urine?  Patient denies   Any bowel dysfunction?  Intermittent constipation     Patient lives  ***with his daughter, he has HHN 3 times a week Does the patient drive?  He no longer drives   HISTORY OF PRESENT ILLNESS 12/07/2020: This is a pleasant 76 year old right-handed man with a history of hypertension, hyperlipidemia, CAD, COPD, alcohol abuse, presenting for evaluation of dementia. He underwent Neuropsychological evaluation in 05/2020 indicating Major Neurocognitive Disorder (ie dementia). Etiology difficult to determine, largely due to fairly diffuse nature of cognitive impairment. Alzheimer's disease is the most common form of neurodegenerative illness, and his profile does share many characteristics of this pattern. Lewy body dementia should remain on the differential, due to frontal subcortical dysfunction with pronounced visuospatial deficits, reports of potential visual hallucinations, and REM sleep behaviors. He also has a large number  of cardiovascular ailments in his history with likely vascular contributions, making a "mixed dementia" presentation very likely. I personally reviewed MRI brain without contrast done 06/2020 which did not show any chronic microvascular disease. There was mild to moderate diffuse atrophy, no acute changes. He has been on Donepezil '10mg'$  qhs.    He feels his memory is "kind of screwed up a bit." Jesus Jordan had been living in  [place] and started noticing changes at least a year ago, however COVID had made it hard to see him more often initially. When she visited, she noted a lot of confusion, standing and staring. He was drinking a 6-pack beer daily. She took over finances in November 2021 as she noticed bills were getting overwhelming, he was losing them/putting them down somewhere he could not find. When [place] visited, she discovered he was not taking his regular medications. He continues to drive short distances in the daytime and denies getting lost, Jesus Jordan denies any issues with local driving. She moved in to stay with him in February 2022 and has been managing medications.She noted visual hallucinations in August 2021, thinking people were in the house or having a conversation with someone. This has quieted down, he still has both delusions that someone/family was there, occurring around once a week. No paranoia. When he takes his medications consistently and on a routine, he does much better. He is independent with dressing and bathing. He is able to make his breakfast and has not left the stove on, no difficulties using the microwave. Sleep is overall good, sometimes he yells in his sleep or she sees him acting out his dreams. He sometimes wakes up almost in a cold sweat thinking he needs to do something due to vivid dreams. He is on mirtazapine '15mg'$  qhs, Jesus Jordan notes that things are worse when he does not take it at night. There is a family history of dementia in his maternal grandparents and 2 sisters. No history of significant head injuries. They report he has definitely cut down on beer intake to one at night, alternating with coke.   He denies any headaches, dizziness, vision changes, neck/back pain, bladder dysfunction, anosmia, or tremors. He has some constipation. He has numbness in the right hand in a median nerve distribution. He had a fall while playing tennis in March, sustaining a hip fracture s/p fixation. He has  been recovering well and ambulates without assistance. No further falls.    PREVIOUS MEDICATIONS:   CURRENT MEDICATIONS:  Outpatient Encounter Medications as of 06/15/2022  Medication Sig   allopurinol (ZYLOPRIM) 300 MG tablet TAKE ONE TABLET BY MOUTH EVERY MORNING   donepezil (ARICEPT) 10 MG tablet TAKE ONE TABLET BY MOUTH EVERY MORNING   doxycycline (VIBRA-TABS) 100 MG tablet Take 1 tablet (100 mg total) by mouth daily. For rosacea/continuous   ezetimibe-simvastatin (VYTORIN) 10-40 MG tablet TAKE ONE TABLET BY MOUTH EVERY MORNING   memantine (NAMENDA) 10 MG tablet Take 1 tablet (10 mg at night) for 2 weeks, then increase to 1 tablet (10 mg) twice a day   metoprolol succinate (TOPROL-XL) 25 MG 24 hr tablet TAKE ONE TABLET BY MOUTH EVERY MORNING   mirtazapine (REMERON) 15 MG tablet TAKE ONE TABLET BY MOUTH EVERYDAY AT BEDTIME   Multiple Vitamin (MULTIVITAMIN WITH MINERALS) TABS tablet Take 1 tablet by mouth daily.   ondansetron (ZOFRAN) 4 MG tablet Take 1 tablet (4 mg total) by mouth every 6 (six) hours as needed for nausea.   polyethylene glycol (MIRALAX / GLYCOLAX) 17  g packet Take 17 g by mouth daily as needed for mild constipation.   QUEtiapine (SEROQUEL) 25 MG tablet TAKE 1 TABLET(25 MG) BY MOUTH TWICE DAILY   senna-docusate (SENOKOT-S) 8.6-50 MG tablet Take 1 tablet by mouth 2 (two) times daily.   tetrahydrozoline-zinc (VISINE-AC) 0.05-0.25 % ophthalmic solution Place 2 drops into both eyes 3 (three) times daily as needed (for itching ot redness).    thiamine 100 MG tablet Take 1 tablet (100 mg total) by mouth daily.   No facility-administered encounter medications on file as of 06/15/2022.        No data to display             No data to display          Objective:     PHYSICAL EXAMINATION:    VITALS:  There were no vitals filed for this visit.  GEN:  The patient appears stated age and is in NAD. HEENT:  Normocephalic, atraumatic.   Neurological  examination:  General: NAD, well-groomed, appears stated age. Orientation: The patient is alert. Oriented to person, place and date Cranial nerves: There is good facial symmetry.The speech is fluent and clear. No aphasia or dysarthria. Fund of knowledge is appropriate. Recent and remote memory are impaired. Attention and concentration are reduced.  Able to name objects and repeat phrases.  Hearing is intact to conversational tone.    Sensation: Sensation is intact to light touch throughout Motor: Strength is at least antigravity x4. DTR's 2/4 in UE/LE     Movement examination: Tone: There is normal tone in the UE/LE Abnormal movements:  no tremor.  No myoclonus.  No asterixis.   Coordination:  There is no decremation with RAM's. Normal finger to nose  Gait and Station: The patient has no difficulty arising out of a deep-seated chair without the use of the hands. The patient's stride length is good.  Gait is cautious and narrow.    Thank you for allowing [place] the opportunity to participate in the care of this nice patient. Please do not hesitate to contact us for any questions or concerns.   Total time spent on today's visit was *** minutes dedicated to this patient today, preparing to see patient, examining the patient, ordering tests and/or medications and counseling the patient, documenting clinical information in the EHR or other health record, independently interpreting results and communicating results to the patient/family, discussing treatment and goals, answering patient's questions and coordinating care.  Cc:  Eulas Post, MD  [name] 06/15/2022 7:06 AM

## 2022-06-16 ENCOUNTER — Other Ambulatory Visit: Payer: Self-pay | Admitting: Family Medicine

## 2022-06-16 DIAGNOSIS — F0280 Dementia in other diseases classified elsewhere without behavioral disturbance: Secondary | ICD-10-CM

## 2022-06-17 ENCOUNTER — Telehealth: Payer: Self-pay | Admitting: Pharmacist

## 2022-06-17 NOTE — Chronic Care Management (AMB) (Signed)
Chronic Care Management Pharmacy Assistant   Name: Jesus Jordan  MRN: [id number] DOB: 02-Feb-1946  Reason for Encounter: Medication Review / Medication Coordination Call   Recent office visits:  None  Recent consult visits:  None  Hospital visits:  None  Medications: Outpatient Encounter Medications as of 06/17/2022  Medication Sig   allopurinol (ZYLOPRIM) 300 MG tablet TAKE ONE TABLET BY MOUTH EVERY MORNING   donepezil (ARICEPT) 10 MG tablet TAKE ONE TABLET BY MOUTH EVERY MORNING   doxycycline (VIBRA-TABS) 100 MG tablet Take 1 tablet (100 mg total) by mouth daily. For rosacea/continuous   ezetimibe-simvastatin (VYTORIN) 10-40 MG tablet TAKE ONE TABLET BY MOUTH EVERY MORNING   memantine (NAMENDA) 10 MG tablet Take 1 tablet (10 mg at night) for 2 weeks, then increase to 1 tablet (10 mg) twice a day   metoprolol succinate (TOPROL-XL) 25 MG 24 hr tablet TAKE ONE TABLET BY MOUTH EVERY MORNING   mirtazapine (REMERON) 15 MG tablet TAKE ONE TABLET BY MOUTH EVERYDAY AT BEDTIME   Multiple Vitamin (MULTIVITAMIN WITH MINERALS) TABS tablet Take 1 tablet by mouth daily.   ondansetron (ZOFRAN) 4 MG tablet Take 1 tablet (4 mg total) by mouth every 6 (six) hours as needed for nausea.   polyethylene glycol (MIRALAX / GLYCOLAX) 17 g packet Take 17 g by mouth daily as needed for mild constipation.   QUEtiapine (SEROQUEL) 25 MG tablet TAKE ONE TABLET BY MOUTH twice daily   senna-docusate (SENOKOT-S) 8.6-50 MG tablet Take 1 tablet by mouth 2 (two) times daily.   tetrahydrozoline-zinc (VISINE-AC) 0.05-0.25 % ophthalmic solution Place 2 drops into both eyes 3 (three) times daily as needed (for itching ot redness).    thiamine 100 MG tablet Take 1 tablet (100 mg total) by mouth daily.   No facility-administered encounter medications on file as of 06/17/2022.   Reviewed chart for medication changes ahead of medication coordination call.  BP Readings from Last 3 Encounters:  12/10/21 (!) 92/56   11/10/21 102/60  06/22/21 (!) 98/59    Lab Results  Component Value Date   HGBA1C  09/13/2008    5.8 (NOTE)   The ADA recommends the following therapeutic goal for glycemic   control related to Hgb A1C measurement:   Goal of Therapy:   < 7.0% Hgb A1C   Reference: American Diabetes Association: Clinical Practice   Recommendations 2008, Diabetes Care,  2008, 31:(Suppl 1).     Patient obtains medications through Adherence Packaging  30 Days    Last adherence delivery included:  Aspirin 81 mg - one tablet at bedtime Docusate 100 mg - 1 capsule at breakfast and at bedtime Vytorin 10-40 mg - 1 tablet at breakfast Seroquel 25 mg - one tablet twice daily(vial) Donepezil 10 mg - one tablet at breakfast Metoprolol 25 mg - 1 tablet at breakfast Memantine 10 mg - one at breakfast and bedtime Mirtazapine 15 mg - one tablet at bedtime    Patient declined  last month:  Allopurinol 300 mg - 1 tablet every morning - pt has plenty on hand    Patient is due for next adherence delivery on: 06/29/2022   Called patient and reviewed medications and coordinated delivery.   This delivery to include: Aspirin 81 mg - one tablet at bedtime Docusate 100 mg - 1 capsule at breakfast and at bedtime Vytorin 10-40 mg - 1 tablet at breakfast Seroquel 25 mg - one tablet twice daily(vial) Donepezil 10 mg - one tablet at breakfast Metoprolol 25 mg -  1 tablet at breakfast Memantine 10 mg - one at breakfast and bedtime Mirtazapine 15 mg - one tablet at bedtime    Patient declined the following medications: Allopurinol 300 mg - 1 tablet every morning - pt has plenty on hand   Unable to confirmed delivery date of 06/29/2022 with patients daughter.    Care Gaps: AWV- 05/18/22 message to Jesus Jordan  Last BP - 92/56 on 12/10/2021 Shingrix - never done AWV - overdue Covid - overdue Tdap - overdue Colonoscopy - overdue Flu - overdue  Star Rating Drugs: Vytorin 10-40 mg  - last filled 05/24/2022 30 DS at  [place] [phone]

## 2022-06-27 ENCOUNTER — Ambulatory Visit: Payer: Medicare Other | Admitting: Physician Assistant

## 2022-06-27 NOTE — Progress Notes (Incomplete)
Assessment/Plan:   Memory Impairment Jesus Jordan is a very pleasant 77 y.o. RH malewith a history of hypertension, hyperlipidemia, CAD, COPD, alcohol abuse  seen today in follow up for memory loss. Patient is currently on . MRI brain personally reviewed was remarkable for mild to moderate diffuse atrophy, without acute changes.  He is currently on donepezil 10 mg daily (in the morning due to agitation during the evening), as well as memantine 10 mg twice daily.  Follow up in   months. Continue donepezil 10 mg daily, side effects discussed Continue memantine 10 mg twice daily, side effects discussed Continue to monitor mood by PCP, he is on Seroquel and Remeron Continue to monitor low blood pressure*** Continue to monitor other cardiovascular risk factors     Subjective:    This patient is accompanied in the office by his daughter who supplements the history.  Previous records as well as any outside records available were reviewed prior to todays visit. Patient was last seen on 12/10/2021   Any changes in memory since last visit?  Thoughts continue to be tangential*** repeats oneself?  Endorsed, more than before.*** Disoriented when walking into a room?  Patient denies except occasionally not remembering what patient came to the room for ***  Leaving objects in unusual places?   Endorsed***  Wandering behavior?  He likes to go outside and look at the trees*** Any personality changes since last visit?  denies   Any worsening depression?:  denies*** Hallucinations or paranoia?  Occasionally he feels that people are stealing from him " Seizures?    denies    Any sleep changes?  Denies vivid dreams, REM behavior or sleepwalking   Sleep apnea?   denies   Any hygiene concerns?    denies   Independent of bathing and dressing?  Endorsed  Does the patient needs help with medications? is in charge *** Who is in charge of the finances?   is in charge   *** Any changes in appetite?  denies  ***   Patient have trouble swallowing?  denies   Does the patient cook?  Any kitchen accidents such as leaving the stove on? Patient denies   Any headaches?   denies   Chronic back pain  denies   Ambulates with difficulty?     denies   Recent falls or head injuries? denies     Unilateral weakness, numbness or tingling?    denies   Any tremors?  denies   Any anosmia?  Patient denies   Any incontinence of urine?  denies   Any bowel dysfunction?    Intermittent constipation Patient lives  ***with his daughter, he has home health nurse 3 times a week. Does the patient drive?***  HISTORY OF PRESENT ILLNESS 12/07/2020: This is a pleasant 77 year old right-handed man with a history of hypertension, hyperlipidemia, CAD, COPD, alcohol abuse, presenting for evaluation of dementia. He underwent Neuropsychological evaluation in 05/2020 indicating Major Neurocognitive Disorder (ie dementia). Etiology difficult to determine, largely due to fairly diffuse nature of cognitive impairment. Alzheimer's disease is the most common form of neurodegenerative illness, and his profile does share many characteristics of this pattern. Lewy body dementia should remain on the differential, due to frontal subcortical dysfunction with pronounced visuospatial deficits, reports of potential visual hallucinations, and REM sleep behaviors. He also has a large number of cardiovascular ailments in his history with likely vascular contributions, making a "mixed dementia" presentation very likely. I personally reviewed MRI brain without contrast done  06/2020 which did not show any chronic microvascular disease. There was mild to moderate diffuse atrophy, no acute changes. He has been on Donepezil '10mg'$  qhs.    He feels his memory is "kind of screwed up a bit." Jesus Jordan had been living in [place] and started noticing changes at least a year ago, however COVID had made it hard to see him more often initially. When she visited, she noted a lot  of confusion, standing and staring. He was drinking a 6-pack beer daily. She took over finances in November 2021 as she noticed bills were getting overwhelming, he was losing them/putting them down somewhere he could not find. When Jesus Jordan visited, she discovered he was not taking his regular medications. He continues to drive short distances in the daytime and denies getting lost, Jesus Jordan denies any issues with local driving. She moved in to stay with him in February 2022 and has been managing medications.She noted visual hallucinations in August 2021, thinking people were in the house or having a conversation with someone. This has quieted down, he still has both delusions that someone/family was there, occurring around once a week. No paranoia. When he takes his medications consistently and on a routine, he does much better. He is independent with dressing and bathing. He is able to make his breakfast and has not left the stove on, no difficulties using the microwave. Sleep is overall good, sometimes he yells in his sleep or she sees him acting out his dreams. He sometimes wakes up almost in a cold sweat thinking he needs to do something due to vivid dreams. He is on mirtazapine '15mg'$  qhs, Jesus Jordan notes that things are worse when he does not take it at night. There is a family history of dementia in his maternal grandparents and 2 sisters. No history of significant head injuries. They report he has definitely cut down on beer intake to one at night, alternating with coke.   He denies any headaches, dizziness, vision changes, neck/back pain, bladder dysfunction, anosmia, or tremors. He has some constipation. He has numbness in the right hand in a median nerve distribution. He had a fall while playing tennis in March, sustaining a hip fracture s/p fixation. He has been recovering well and ambulates without assistance. No further falls.      Labs on 09/04/2020 shows TSH 0.402, B12 324  PREVIOUS MEDICATIONS:   CURRENT  MEDICATIONS:  Outpatient Encounter Medications as of 06/27/2022  Medication Sig   allopurinol (ZYLOPRIM) 300 MG tablet TAKE ONE TABLET BY MOUTH EVERY MORNING   donepezil (ARICEPT) 10 MG tablet TAKE ONE TABLET BY MOUTH EVERY MORNING   doxycycline (VIBRA-TABS) 100 MG tablet Take 1 tablet (100 mg total) by mouth daily. For rosacea/continuous   ezetimibe-simvastatin (VYTORIN) 10-40 MG tablet TAKE ONE TABLET BY MOUTH EVERY MORNING   memantine (NAMENDA) 10 MG tablet Take 1 tablet (10 mg at night) for 2 weeks, then increase to 1 tablet (10 mg) twice a day   metoprolol succinate (TOPROL-XL) 25 MG 24 hr tablet TAKE ONE TABLET BY MOUTH EVERY MORNING   mirtazapine (REMERON) 15 MG tablet TAKE ONE TABLET BY MOUTH EVERYDAY AT BEDTIME   Multiple Vitamin (MULTIVITAMIN WITH MINERALS) TABS tablet Take 1 tablet by mouth daily.   ondansetron (ZOFRAN) 4 MG tablet Take 1 tablet (4 mg total) by mouth every 6 (six) hours as needed for nausea.   polyethylene glycol (MIRALAX / GLYCOLAX) 17 g packet Take 17 g by mouth daily as needed for mild constipation.  QUEtiapine (SEROQUEL) 25 MG tablet TAKE ONE TABLET BY MOUTH twice daily   senna-docusate (SENOKOT-S) 8.6-50 MG tablet Take 1 tablet by mouth 2 (two) times daily.   tetrahydrozoline-zinc (VISINE-AC) 0.05-0.25 % ophthalmic solution Place 2 drops into both eyes 3 (three) times daily as needed (for itching ot redness).    thiamine 100 MG tablet Take 1 tablet (100 mg total) by mouth daily.   No facility-administered encounter medications on file as of 06/27/2022.        No data to display             No data to display          Objective:     PHYSICAL EXAMINATION:    VITALS:  There were no vitals filed for this visit.  GEN:  The patient appears stated age and is in NAD. HEENT:  Normocephalic, atraumatic.   Neurological examination:  General: NAD, well-groomed, appears stated age. Orientation: The patient is alert. Oriented to person, place and  date Cranial nerves: There is good facial symmetry.The speech is fluent and clear. No aphasia or dysarthria. Fund of knowledge is appropriate. Recent and remote memory are impaired. Attention and concentration are reduced.  Able to name objects and repeat phrases.  Hearing is intact to conversational tone.    Sensation: Sensation is intact to light touch throughout Motor: Strength is at least antigravity x4. DTR's 2/4 in UE/LE     Movement examination: Tone: There is normal tone in the UE/LE Abnormal movements:  no tremor.  No myoclonus.  No asterixis.   Coordination:  There is no decremation with RAM's.  Abnormal finger to nose  Gait and Station: The patient has no difficulty arising out of a deep-seated chair without the use of the hands. The patient's stride length is good.  Gait is cautious and narrow.    Thank you for allowing [place] the opportunity to participate in the care of this nice patient. Please do not hesitate to contact us for any questions or concerns.   Total time spent on today's visit was *** minutes dedicated to this patient today, preparing to see patient, examining the patient, ordering tests and/or medications and counseling the patient, documenting clinical information in the EHR or other health record, independently interpreting results and communicating results to the patient/family, discussing treatment and goals, answering patient's questions and coordinating care.  Cc:  Eulas Post, MD  [name] 06/27/2022 12:33 PM

## 2022-06-28 ENCOUNTER — Telehealth: Payer: Self-pay | Admitting: Pharmacist

## 2022-06-28 NOTE — Progress Notes (Signed)
Care Management & Coordination Services Pharmacy Team  Name: Jesus Jordan  MRN: [id number] DOB: 11-10-1945  Reason for Encounter: Appointment Reminder  Contacted patient on 06/28/2022   Medications: Outpatient Encounter Medications as of 06/28/2022  Medication Sig   allopurinol (ZYLOPRIM) 300 MG tablet TAKE ONE TABLET BY MOUTH EVERY MORNING   donepezil (ARICEPT) 10 MG tablet TAKE ONE TABLET BY MOUTH EVERY MORNING   doxycycline (VIBRA-TABS) 100 MG tablet Take 1 tablet (100 mg total) by mouth daily. For rosacea/continuous   ezetimibe-simvastatin (VYTORIN) 10-40 MG tablet TAKE ONE TABLET BY MOUTH EVERY MORNING   memantine (NAMENDA) 10 MG tablet Take 1 tablet (10 mg at night) for 2 weeks, then increase to 1 tablet (10 mg) twice a day   metoprolol succinate (TOPROL-XL) 25 MG 24 hr tablet TAKE ONE TABLET BY MOUTH EVERY MORNING   mirtazapine (REMERON) 15 MG tablet TAKE ONE TABLET BY MOUTH EVERYDAY AT BEDTIME   Multiple Vitamin (MULTIVITAMIN WITH MINERALS) TABS tablet Take 1 tablet by mouth daily.   ondansetron (ZOFRAN) 4 MG tablet Take 1 tablet (4 mg total) by mouth every 6 (six) hours as needed for nausea.   polyethylene glycol (MIRALAX / GLYCOLAX) 17 g packet Take 17 g by mouth daily as needed for mild constipation.   QUEtiapine (SEROQUEL) 25 MG tablet TAKE ONE TABLET BY MOUTH twice daily   senna-docusate (SENOKOT-S) 8.6-50 MG tablet Take 1 tablet by mouth 2 (two) times daily.   tetrahydrozoline-zinc (VISINE-AC) 0.05-0.25 % ophthalmic solution Place 2 drops into both eyes 3 (three) times daily as needed (for itching ot redness).    thiamine 100 MG tablet Take 1 tablet (100 mg total) by mouth daily.   No facility-administered encounter medications on file as of 06/28/2022.   Lab Results  Component Value Date/Time   HGBA1C  09/13/2008 03:30 AM    5.8 (NOTE)   The ADA recommends the following therapeutic goal for glycemic   control related to Hgb A1C measurement:   Goal of Therapy:   < 7.0% Hgb A1C    Reference: American Diabetes Association: Clinical Practice   Recommendations 2008, Diabetes Care,  2008, 31:(Suppl 1).    BP Readings from Last 3 Encounters:  12/10/21 (!) 92/56  11/10/21 102/60  06/22/21 (!) 98/59    Unsuccessful attempt to reach patient. Left patient message reminding patient of appointment.  Unable to confirm telephone appointment with [name] PharmD, on 06/29/2022 at 11:45.  Care Gaps: AWV- 05/18/22 message to [name]  Last BP - 92/56 on 12/10/2021 Shingrix - never done AWV - overdue Covid - overdue Tdap - overdue Colonoscopy - overdue Flu - overdue  Star Rating Drugs: Vytorin 10-40 mg  - last filled 06/24/2022 30 DS at [facility] [phone]

## 2022-06-29 ENCOUNTER — Telehealth: Payer: Medicare Other

## 2022-06-29 ENCOUNTER — Ambulatory Visit: Payer: Medicare Other | Admitting: Pharmacist

## 2022-06-29 ENCOUNTER — Other Ambulatory Visit: Payer: Self-pay | Admitting: Family Medicine

## 2022-06-29 DIAGNOSIS — I2510 Atherosclerotic heart disease of native coronary artery without angina pectoris: Secondary | ICD-10-CM

## 2022-06-29 DIAGNOSIS — I10 Essential (primary) hypertension: Secondary | ICD-10-CM

## 2022-06-29 MED ORDER — QUETIAPINE FUMARATE 50 MG PO TABS: 50.0000 mg | ORAL_TABLET | Freq: Every day | ORAL | 3 refills | Status: DC

## 2022-06-29 NOTE — Progress Notes (Signed)
Care Management & Coordination Services Pharmacy Note  06/29/2022 Name:  Jesus Jordan MRN:  [id number] DOB:  07/18/1945  Summary: Pt's daughter is having difficulty with patient taking Seroquel BP is running low at home  Recommendations/Changes made from today's visit: -Recommended switching Seroquel to 50 mg tablet -Consider decreasing metoprolol to 1/2 tablet daily based on low BP readings  Follow up plan: Scheduled PCP follow up for BP BP assessment in 2-3 months  Subjective: Jesus Jordan is an 77 y.o. year old male who is a primary patient of Jesus Jordan, Jesus Sierras, MD.  The care coordination team was consulted for assistance with disease management and care coordination needs.    Engaged with patient by telephone for follow up visit.  Patient Care Team: Jesus Post, MD as PCP - General Jesus Sine, MD as PCP - Cardiology (Cardiology) Jesus Jordan, [facility] as Pharmacist (Pharmacist)  Recent office visits: 11/10/2021 Jesus Littler MD - Patient was seen for insomnia and additional issues. Started Quetiapine 15 mg at bedtime. Discontinued Tylenol, Folic acid and Tramadol. No follow up noted.   01/28/2022 Jesus Henrene Pastor LCSW (palliative care) - patient was seen for palliative care encounter. No medication changes.    Recent consult visits: 12/10/2021 Jesus Butters PA-C (Neurology) - Patient was seen for Major neurocognitive disorder due to multiple etiologies without behavioral disturbance and an additional issue. Started Memantine 10 mg. Follow up in 6 months.   Hospital visits: None in previous 6 months   Objective:  Lab Results  Component Value Date   CREATININE 0.96 09/09/2020   BUN 25 (H) 09/09/2020   GFR 95.90 07/10/2017   GFRNONAA >60 09/09/2020   GFRAA >60 08/28/2019   NA 135 09/09/2020   K 3.9 09/09/2020   CALCIUM 8.8 (L) 09/09/2020   CO2 26 09/09/2020   GLUCOSE 99 09/09/2020    Lab Results  Component Value Date/Time   HGBA1C  09/13/2008 03:30 AM     5.8 (NOTE)   The ADA recommends the following therapeutic goal for glycemic   control related to Hgb A1C measurement:   Goal of Therapy:   < 7.0% Hgb A1C   Reference: American Diabetes Association: Clinical Practice   Recommendations 2008, Diabetes Care,  2008, 31:(Suppl 1).   GFR 95.90 07/10/2017 03:45 PM   GFR 100.39 03/21/2016 12:19 PM    Last diabetic Eye exam: No results found for: "HMDIABEYEEXA"  Last diabetic Foot exam: No results found for: "HMDIABFOOTEX"   Lab Results  Component Value Date   CHOL 135 12/14/2020   HDL 62.70 12/14/2020   LDLCALC 45 12/14/2020   TRIG 138.0 12/14/2020   CHOLHDL 2 12/14/2020       Latest Ref Rng & Units 12/14/2020    2:22 PM 09/02/2020    5:14 PM 02/10/2020   11:48 AM  Hepatic Function  Total Protein 6.0 - 8.3 g/dL 7.0  7.1  6.9   Albumin 3.5 - 5.2 g/dL 4.4  4.1    AST 0 - 37 U/L '29  28  28   '$ ALT 0 - 53 U/L '24  20  21   '$ Alk Phosphatase 39 - 117 U/L 80  64    Total Bilirubin 0.2 - 1.2 mg/dL 0.4  1.3  0.8   Bilirubin, Direct 0.0 - 0.3 mg/dL 0.1   0.2     Lab Results  Component Value Date/Time   TSH 0.402 09/04/2020 05:22 AM   TSH 1.40 02/10/2020 11:48 AM   TSH 0.68 03/21/2016 12:19  PM       Latest Ref Rng & Units 09/09/2020    5:33 AM 09/07/2020    7:22 AM 09/06/2020    5:26 AM  CBC  WBC 4.0 - 10.5 K/uL 7.9  6.9  8.6   Hemoglobin 13.0 - 17.0 g/dL 10.9  12.2  12.0   Hematocrit 39.0 - 52.0 % 33.7  37.2  36.1   Platelets 150 - 400 K/uL 174  144  141     Lab Results  Component Value Date/Time   VD25OH 35.85 09/02/2020 05:14 PM   VITAMINB12 324 09/04/2020 05:22 AM   VITAMINB12 469 02/10/2020 11:48 AM    Clinical ASCVD: Yes  The ASCVD Risk score (Arnett DK, et al., 2019) failed to calculate for the following reasons:   The patient has a prior MI or stroke diagnosis       11/10/2021    1:56 PM 07/14/2020   10:28 AM 04/29/2019    9:40 AM  Depression screen PHQ 2/9  Decreased Interest 1 0 1  Down, Depressed, Hopeless 1 1 0  PHQ  - 2 Score '2 1 1  '$ Altered sleeping 2 1   Tired, decreased energy 0 1   Change in appetite 1 0   Feeling bad or failure about yourself  0 1   Trouble concentrating 2 1   Moving slowly or fidgety/restless 1 1   Suicidal thoughts 0 0   PHQ-9 Score 8 6   Difficult doing work/chores Somewhat difficult       Social History   Tobacco Use  Smoking Status Some Days   Years: 30.00   Types: Cigarettes  Smokeless Tobacco Never  Tobacco Comments   skip days some; pack last 3 days    BP Readings from Last 3 Encounters:  12/10/21 (!) 92/56  11/10/21 102/60  06/22/21 (!) 98/59   Pulse Readings from Last 3 Encounters:  12/10/21 68  11/10/21 60  06/22/21 71   Wt Readings from Last 3 Encounters:  12/10/21 155 lb (70.3 kg)  11/10/21 155 lb 4.8 oz (70.4 kg)  06/22/21 155 lb 9.6 oz (70.6 kg)   BMI Readings from Last 3 Encounters:  12/10/21 22.24 kg/m  11/10/21 22.28 kg/m  06/22/21 22.33 kg/m    Allergies  Allergen Reactions   Imdur [Isosorbide Nitrate] Other (See Comments)    Caused vertigo   Lisinopril Other (See Comments)    Possible vertigo??    Medications Reviewed Today     Reviewed by Jesus Laurence, LPN (Licensed Practical Nurse) on 12/10/21 at 1101  Med List Status: <None>   Medication Order Taking? Sig Documenting Provider Last Dose Status Informant  allopurinol (ZYLOPRIM) 300 MG tablet [id number] Yes TAKE ONE TABLET BY MOUTH EVERY MORNING Jesus Jordan, Jesus Sierras, MD Taking Active   donepezil (ARICEPT) 10 MG tablet [id number] Yes TAKE ONE TABLET BY MOUTH EVERY MORNING Jesus Jordan, Jesus Sierras, MD Taking Active   doxycycline (VIBRA-TABS) 100 MG tablet [id number] Yes Take 1 tablet (100 mg total) by mouth daily. For rosacea/continuous Jesus Jordan, Jesus Sierras, MD Taking Active   ezetimibe-simvastatin (VYTORIN) 10-40 MG tablet [id number] Yes Take 1 tablet by mouth daily. Jesus Sine, MD Taking Active   metoprolol succinate (TOPROL-XL) 25 MG 24 hr tablet [id number] Yes TAKE ONE TABLET BY  MOUTH EVERY MORNING Jesus Jordan, Jesus Sierras, MD Taking Active   mirtazapine (REMERON) 15 MG tablet [id number] Yes TAKE ONE TABLET BY MOUTH EVERYDAY AT BEDTIME Jesus Jordan, Jesus Sierras, MD Taking Active   Multiple Vitamin (MULTIVITAMIN  WITH MINERALS) TABS tablet [id number] Yes Take 1 tablet by mouth daily. Georgette Shell, MD Taking Active   ondansetron [facility]) 4 MG tablet [id number] Yes Take 1 tablet (4 mg total) by mouth every 6 (six) hours as needed for nausea. Georgette Shell, MD Taking Active   polyethylene glycol [facility] / GLYCOLAX) 17 g packet [id number] Yes Take 17 g by mouth daily as needed for mild constipation. Georgette Shell, MD Taking Active   QUEtiapine (SEROQUEL) 25 MG tablet [id number] Yes Take 1 tablet (25 mg total) by mouth at bedtime. Jesus Post, MD Taking Active   senna-docusate (SENOKOT-S) 8.6-50 MG tablet [id number] Yes Take 1 tablet by mouth 2 (two) times daily. Georgette Shell, MD Taking Active   tetrahydrozoline-zinc [facility]) 0.05-0.25 % ophthalmic solution [id number] Yes Place 2 drops into both eyes 3 (three) times daily as needed (for itching ot redness).  [provider] Taking Active Child  thiamine 100 MG tablet [id number] Yes Take 1 tablet (100 mg total) by mouth daily. Georgette Shell, MD Taking Active             Patient Active Problem List   Diagnosis Date Noted   Rosacea 11/10/2021   Hip fracture ([place]) 09/03/2020   Closed right hip fracture ([place]) 09/02/2020   Major neurocognitive disorder due to multiple etiologies without behavioral disturbance 05/29/2020   Alcohol abuse, daily use    Hearing loss    COPD (chronic obstructive pulmonary disease) 12/10/2018   Essential hypertension 12/10/2018   Chest pain 12/09/2018   GERD (gastroesophageal reflux disease) 04/18/2018   CAD in native artery 05/03/2016   Hyperlipidemia with target LDL less than 70 03/06/2013   Inguinal hernia 05/25/2009   Gout 01/29/2009   Coronary  atherosclerosis 01/29/2009   History of MI (myocardial infarction) 09/12/2008    Immunization History  Administered Date(s) Administered   Fluad Quad(high Dose 65+) 02/08/2019, 03/06/2020   Influenza, High Dose Seasonal PF 04/30/2013, 05/12/2014, 05/12/2015, 03/21/2016, 04/20/2017   Influenza-Unspecified 03/13/2018, 02/19/2019, 05/26/2021   PFIZER(Purple Top)SARS-COV-2 Vaccination 08/01/2019, 08/26/2019   Pneumococcal Conjugate-13 01/14/2015, 03/13/2018   Pneumococcal Polysaccharide-23 01/07/2010, 03/21/2016   Pneumococcal-Unspecified 05/24/2019   Td 06/20/1993, 01/07/2010   Zoster, Live 02/14/2012    Compliance/Adherence/Medication fill history: Care Gaps: Shingrix, AWV, COVID booster, tetanus, colonoscopy, influenza vaccine Last BP: 92/56 (12/10/21)  Star-Rating Drugs: Vytorin 10-40 mg  - last filled 06/24/2022 30 DS at [facility]   SDOH:  (Social Determinants of Health) assessments and interventions performed: Yes  SDOH Interventions    [place] Coordination from 06/29/2022 in [place] [facility] from 04/21/2020 in [place] [facility] [place]  SDOH Interventions    Transportation Interventions -- Intervention Not Indicated  Financial Strain Interventions Intervention Not Indicated Intervention Not Indicated      SDOH Screenings   Transportation Needs: No Transportation Needs (04/28/2020)  Depression (PHQ2-9): Medium Risk (11/10/2021)  Financial Resource Strain: Low Risk  (06/29/2022)  Tobacco Use: High Risk (12/10/2021)    Medication Assistance: None required.  Patient affirms current coverage meets needs.  Medication Access: Within the past 30 days, how often has patient missed a dose of medication? 2-3 times Is a pillbox or other method used to improve adherence? Yes  Factors that may affect medication adherence?  Memory impairment Are meds synced by current pharmacy? Yes  Are meds delivered by current pharmacy? Yes  Does  patient experience delays in picking up medications due to transportation concerns? No   [facility] Reviewed: Is  patient disadvantaged to use [facility]?: No  Current Rx insurance plan: [place] Name and location of Current pharmacy:  [place], [place] - [address] [place] [address] Phone: [phone] Fax: [phone]  [facility] - [place], [place] - [address] [place] [address] Phone: [phone] Fax: [place], [place] - [address] [place] AT [address] [place] [address] Phone: [phone] Fax: [phone]  [facility] services reviewed with patient today?: No  Patient requests to transfer care to [facility]?: No  Reason patient declined to change pharmacies: Patient is already actively enrolled with [facility]  Spoke with patient's daughter for the call. [name] reports she is having trouble getting him to take the Seroquel because he thinks the Seroquel is causing him to not sleep. He is sometimes missing the evening dose and almost always missing the morning dose. Discussed switching to the 50 mg tablet.  BP has been consistent and always seems low to his daughter. She reports this morning it was 91/75 and lately it's usually been in that range. Her dad doesn't complain of dizziness or lightheadedness but had a major fall about a year ago.  Assessment/Plan  Patient Care Plan: [facility] Pharmacy Assessment/Plan     Problem Identified: Problem: Hypertension, Hyperlipidemia, Coronary Artery Disease, GERD, Depression, Gout, and Memory loss      Long-Range Goal: Patient-Specific Goal   Start Date: 01/12/2021  Expected End Date: 01/12/2022  Recent Progress: On track  Priority: High  Note:   Hypertension (BP goal  <130/80) -Controlled -Current treatment: Metoprolol succinate 25 mg 1 tablet daily - Appropriate, Effective, Query Safe, Accessible -Medications previously tried: carvedilol (adherence), lisinopril (vertigo)  -Current home readings: 101/76, 91/75 (checking every other day) -Current dietary habits: limiting salt intake -Current exercise habits: limited due to recent fall and fracture -Denies hypotensive/hypertensive symptoms -Educated on BP goals and benefits of medications for prevention of heart attack, stroke and kidney damage; Importance of home blood pressure monitoring; Proper BP monitoring technique; -Counseled to monitor BP at home weekly, document, and provide log at future appointments -Counseled on diet and exercise extensively Recommended to continue current medication Recommended decreasing metoprolol based on low BP readings.  Hyperlipidemia: (LDL goal < 70) -Controlled -Current treatment: Vytorin 10-40 mg 1 tablet daily - Appropriate, Effective, Safe, Accessible -Medications previously tried: none  -Current dietary patterns: did not discuss -Current exercise habits: limited due to recent fall and fracture -Educated on Cholesterol goals;  Importance of limiting foods high in cholesterol; Exercise goal of 150 minutes per week; -Counseled on diet and exercise extensively Recommended to continue current medication  CAD/History of MI (Goal: prevent heart events) -Controlled -Current treatment  Aspirin 81 mg 1 tablet daily in PM - Appropriate, Effective, Safe, Accessible Ezetimibe 10 mg 1 tablet daily - Appropriate, Effective, Safe, Accessible Simvastatin 40 mg 1 tablet daily - Appropriate, Effective, Safe, Accessible Nitroglycerin 0.4 mg SL tablet PRN - Appropriate, Effective, Safe, Accessible -Medications previously tried: clopidogrel (side effects)  -Recommended to continue current medication  Depression (Goal: minimize symptoms) -Controlled -Current  treatment: Mirtazapine 15 mg 1 tablet at bedtime - Appropriate, Query effective, Safe, Accessible -Medications previously tried/failed: n/a -PHQ9: 6 -Educated on Benefits of medication for symptom control -Recommended to continue current medication  Gout (Goal: prevent flare ups and maintain uric acid < 6) -  Controlled -Current treatment  Allopurinol 300 mg 1 tablet daily - Appropriate, Effective, Safe, Accessible -Medications previously tried: none  -Recommended to continue current medication  Memory loss (Goal: slow memory loss) -Not ideally controlled -Current treatment  Donepezil 10 mg 1 tablet in the morning - Appropriate, Effective, Safe, Accessible -Medications previously tried: none  -Recommended to reach out to neurologist to discuss alternative therapy. Discussed possibly switching to rivastigmine patches to minimize side effects depending on coverage/cost. [name] will discuss wit h neurology.  GERD (Goal: minimize symptoms) -Controlled -Current treatment  Tums 500 mg 1-2 tablets as needed - Appropriate, Effective, Safe, Accessible -Medications previously tried: pantoprazole -Recommended to continue current medication   Health Maintenance -Vaccine gaps: shingrix, COVID booster, tetanus -Current therapy:  Desoximetasone 0.25% cream apply twice daily as directed Fluticasone 0.05% cream apply daily as needed Visine AC 0.05%-0.25% apply 2 drops in both eyes three times daily Milk thistle 1000 mg 1 capsule daily -Educated on Cost vs benefit of each product must be carefully weighed by individual consumer -Patient is satisfied with current therapy and denies issues -Recommended adding docusate to packaging to improve adherence and constipation.     [name], PharmD, [place] Pharmacist [place] [facility] [place]

## 2022-07-06 ENCOUNTER — Encounter: Payer: Self-pay | Admitting: Family Medicine

## 2022-07-06 ENCOUNTER — Ambulatory Visit: Payer: Medicare Other | Admitting: Family Medicine

## 2022-07-07 NOTE — Telephone Encounter (Signed)
error 

## 2022-07-11 NOTE — Progress Notes (Incomplete)
Virtual Visit via Video Note The purpose of this virtual visit is to provide medical care in a patient that is unable to be seen in person due to physical or health limitations   Consent was obtained for video visit:  yes  Answered questions that patient had about telehealth interaction:  yes I discussed the limitations, risks, security and privacy concerns of performing an evaluation and management service by telemedicine. I also discussed with the patient that there may be a patient responsible charge related to this service. The patient expressed understanding and agreed to proceed.  Pt location: Home Physician Location: office Name of referring provider:  Eulas Post, MD I connected with Jesus Jordan at patients initiation/request on 07/12/2022 at  2:30 PM EST by video enabled telemedicine application and verified that I am speaking with the correct person using two identifiers. Pt MRN:  [id number] Pt DOB:  1946/01/19 Video Participants:  Jesus Jordan;  ***    Assessment and Plan:    Jesus Jordan s a 77 y.o. RH male with a history of  hypertension, hyperlipidemia, CAD, COPD,  prior alcohol abuse and a history of dementia likely due to Alzheimer's Disease with behavioral disturbance   seen today in follow up for memory loss, via video visit.  Prior MRI brain personally reviewed was remarkable for mild to moderate diffuse atrophy, without acute changes.  He is currently on donepezil 10 mg daily (in the morning due to agitation during the evening), as well as memantine 10 mg twice daily.    Follow up in  Continue donepezil 10 mg daily. Side effects were discussed Continue Memantine 10 mg twice daily. Side effects were discussed Continue to control mood as per PCP. Continue Seroquel twice daily, Remeron qhs Recommend good control of cardiovascular risk factors. Monitor his low BP with PCP     History of Present Illness: ***  He was last seen on 12/10/21.   Any changes in memory  since last visit? repeats oneself?  Endorsed Disoriented when walking into a room?  Patient denies except occasionally not remembering what patient came to the room for ***  Leaving objects in unusual places?  Patient denies   Wandering behavior?   denies   Any personality changes since last visit?   denies   Any worsening depression?: denies   Hallucinations or paranoia?  Occasionally he feels that people are stealing from him *** Seizures?   denies    Any sleep changes?  Denies  vivid dreams, REM behavior or sleepwalking   Sleep apnea?   denies   Any hygiene concerns?   denies   Independent of bathing and dressing?  Endorsed  Does the patient needs help with medications? is in charge *** Who is in charge of the finances?   is in charge   *** Any changes in appetite?  denies ***   Patient have trouble swallowing?  denies   Does the patient cook?  Any kitchen accidents such as leaving the stove on?   denies   Any headaches?    denies   Vision changes? denies Chronic back pain  denies   Ambulates with difficulty?    denies   Recent falls or head injuries?    denies     Unilateral weakness, numbness or tingling?   denies   Any tremors?  denies   Any anosmia?    denies   Any incontinence of urine?  denies   Any bowel dysfunction?  denies  Patient lives with his daughter and has HHN 3 x a week   *** Does the patient drive?***     HISTORY OF PRESENT ILLNESS 12/07/2020: This is a pleasant 77 year old right-handed man with a history of hypertension, hyperlipidemia, CAD, COPD, alcohol abuse, presenting for evaluation of dementia. He underwent Neuropsychological evaluation in 05/2020 indicating Major Neurocognitive Disorder (ie dementia). Etiology difficult to determine, largely due to fairly diffuse nature of cognitive impairment. Alzheimer's disease is the most common form of neurodegenerative illness, and his profile does share many characteristics of this pattern. Lewy body dementia  should remain on the differential, due to frontal subcortical dysfunction with pronounced visuospatial deficits, reports of potential visual hallucinations, and REM sleep behaviors. He also has a large number of cardiovascular ailments in his history with likely vascular contributions, making a "mixed dementia" presentation very likely. I personally reviewed MRI brain without contrast done 06/2020 which did not show any chronic microvascular disease. There was mild to moderate diffuse atrophy, no acute changes. He has been on Donepezil '10mg'$  qhs.    He feels his memory is "kind of screwed up a bit." Jesus Jordan had been living in [place] and started noticing changes at least a year ago, however COVID had made it hard to see him more often initially. When she visited, she noted a lot of confusion, standing and staring. He was drinking a 6-pack beer daily. She took over finances in November 2021 as she noticed bills were getting overwhelming, he was losing them/putting them down somewhere he could not find. When [facility] visited, she discovered he was not taking his regular medications. He continues to drive short distances in the daytime and denies getting lost, Jesus Jordan denies any issues with local driving. She moved in to stay with him in February 2022 and has been managing medications.She noted visual hallucinations in August 2021, thinking people were in the house or having a conversation with someone. This has quieted down, he still has both delusions that someone/family was there, occurring around once a week. No paranoia. When he takes his medications consistently and on a routine, he does much better. He is independent with dressing and bathing. He is able to make his breakfast and has not left the stove on, no difficulties using the microwave. Sleep is overall good, sometimes he yells in his sleep or she sees him acting out his dreams. He sometimes wakes up almost in a cold sweat thinking he needs to do something due to  vivid dreams. He is on mirtazapine '15mg'$  qhs, Jesus Jordan notes that things are worse when he does not take it at night. There is a family history of dementia in his maternal grandparents and 2 sisters. No history of significant head injuries. They report he has definitely cut down on beer intake to one at night, alternating with coke.   He denies any headaches, dizziness, vision changes, neck/back pain, bladder dysfunction, anosmia, or tremors. He has some constipation. He has numbness in the right hand in a median nerve distribution. He had a fall while playing tennis in March, sustaining a hip fracture s/p fixation. He has been recovering well and ambulates without assistance. No further falls.    Current Outpatient Medications on File Prior to Visit  Medication Sig Dispense Refill   allopurinol (ZYLOPRIM) 300 MG tablet TAKE ONE TABLET BY MOUTH EVERY MORNING 30 tablet 2   donepezil (ARICEPT) 10 MG tablet TAKE ONE TABLET BY MOUTH EVERY MORNING 90 tablet 1   doxycycline (VIBRA-TABS) 100  MG tablet Take 1 tablet (100 mg total) by mouth daily. For rosacea/continuous 90 tablet 0   ezetimibe-simvastatin (VYTORIN) 10-40 MG tablet TAKE ONE TABLET BY MOUTH EVERY MORNING 90 tablet 3   memantine (NAMENDA) 10 MG tablet Take 1 tablet (10 mg at night) for 2 weeks, then increase to 1 tablet (10 mg) twice a day 60 tablet 11   metoprolol succinate (TOPROL-XL) 25 MG 24 hr tablet TAKE ONE TABLET BY MOUTH EVERY MORNING 90 tablet 0   mirtazapine (REMERON) 15 MG tablet TAKE ONE TABLET BY MOUTH EVERYDAY AT BEDTIME 30 tablet 1   Multiple Vitamin (MULTIVITAMIN WITH MINERALS) TABS tablet Take 1 tablet by mouth daily.     ondansetron (ZOFRAN) 4 MG tablet Take 1 tablet (4 mg total) by mouth every 6 (six) hours as needed for nausea. 20 tablet 0   polyethylene glycol (MIRALAX / GLYCOLAX) 17 g packet Take 17 g by mouth daily as needed for mild constipation. 14 each 0   QUEtiapine (SEROQUEL) 50 MG tablet Take 1 tablet (50 mg total) by  mouth at bedtime. 90 tablet 3   senna-docusate (SENOKOT-S) 8.6-50 MG tablet Take 1 tablet by mouth 2 (two) times daily.     tetrahydrozoline-zinc (VISINE-AC) 0.05-0.25 % ophthalmic solution Place 2 drops into both eyes 3 (three) times daily as needed (for itching ot redness).      thiamine 100 MG tablet Take 1 tablet (100 mg total) by mouth daily.     No current facility-administered medications on file prior to visit.     Observations/Objective:   There were no vitals filed for this visit. GEN:  The patient appears stated age and is in NAD.  Neurological examination: Patient is awake, alert, oriented x 3. No aphasia or dysarthria. Intact fluency and comprehension. Remote and recent memory intact. Able to name and repeat. Cranial nerves: Extraocular movements intact with no nystagmus. No facial asymmetry. Motor: moves all extremities symmetrically, at least anti-gravity x 4. No incoordination on finger to nose testing. Gait: narrow-based and steady, able to tandem walk adequately. Negative Romberg test.     Follow Up Instructions:    -I discussed the assessment and treatment plan with the patient. The patient was provided an opportunity to ask questions and all were answered. The patient agreed with the plan and demonstrated an understanding of the instructions.   The patient was advised to call back or seek an in-person evaluation if the symptoms worsen or if the condition fails to improve as anticipated.    Total time spent on today's visit was ***minutes, including both face-to-face time and nonface-to-face time.  Time included that spent on review of records (prior notes available to me/labs/imaging if pertinent), discussing treatment and goals, answering patient's questions and coordinating care.   [name], PA-C

## 2022-07-12 ENCOUNTER — Ambulatory Visit: Payer: Medicare Other | Admitting: Physician Assistant

## 2022-07-15 ENCOUNTER — Ambulatory Visit: Payer: Medicare Other | Admitting: Family Medicine

## 2022-07-15 ENCOUNTER — Other Ambulatory Visit: Payer: Self-pay | Admitting: Family Medicine

## 2022-07-18 ENCOUNTER — Telehealth: Payer: Self-pay | Admitting: Pharmacist

## 2022-07-18 NOTE — Progress Notes (Unsigned)
Care Management & Coordination Services Pharmacy Team  Reason for Encounter: Medication coordination and delivery  Contacted patient to discuss medications and coordinate delivery from [facility]. {US HC Outreach:28874}  Cycle dispensing form sent to *** for review.   Last adherence delivery date:06/29/2022      Patient is due for next adherence delivery on: 07/28/2022  This delivery to include: Adherence Packaging  30 Days  Aspirin 81 mg - one tablet at bedtime Docusate 100 mg - 1 capsule at breakfast and at bedtime Vytorin 10-40 mg - 1 tablet at breakfast Seroquel 25 mg - one tablet twice daily(vial) Donepezil 10 mg - one tablet at breakfast Metoprolol 25 mg - 1 tablet at breakfast Memantine 10 mg - one at breakfast and bedtime Mirtazapine 15 mg - one tablet at bedtime  Patient declined the following medications this month: Allopurinol 300 mg - 1 tablet every morning - pt has plenty on hand   {Delivery date:25786}  Any concerns about your medications? {yes/no:20286}  How often do you forget or accidentally miss a dose? {Missed doses:25554}  Is patient in packaging Yes  If yes  What is the date on your next pill pack?  Any concerns or issues with your packaging?  Recent blood pressure readings are as follows:   Chart review:  Recent office visits:  None  Recent consult visits:  None  Hospital visits:  None  Medications: Outpatient Encounter Medications as of 07/18/2022  Medication Sig   allopurinol (ZYLOPRIM) 300 MG tablet TAKE ONE TABLET BY MOUTH EVERY MORNING   donepezil (ARICEPT) 10 MG tablet TAKE ONE TABLET BY MOUTH EVERY MORNING   doxycycline (VIBRA-TABS) 100 MG tablet Take 1 tablet (100 mg total) by mouth daily. For rosacea/continuous   ezetimibe-simvastatin (VYTORIN) 10-40 MG tablet TAKE ONE TABLET BY MOUTH EVERY MORNING   memantine (NAMENDA) 10 MG tablet Take 1 tablet (10 mg at night) for 2 weeks, then increase to 1 tablet (10 mg) twice a day    metoprolol succinate (TOPROL-XL) 25 MG 24 hr tablet TAKE ONE TABLET BY MOUTH EVERY MORNING Patient needs to schedule follow up appt for further refills   mirtazapine (REMERON) 15 MG tablet TAKE ONE TABLET BY MOUTH EVERYDAY AT BEDTIME   Multiple Vitamin (MULTIVITAMIN WITH MINERALS) TABS tablet Take 1 tablet by mouth daily.   ondansetron (ZOFRAN) 4 MG tablet Take 1 tablet (4 mg total) by mouth every 6 (six) hours as needed for nausea.   polyethylene glycol (MIRALAX / GLYCOLAX) 17 g packet Take 17 g by mouth daily as needed for mild constipation.   QUEtiapine (SEROQUEL) 50 MG tablet Take 1 tablet (50 mg total) by mouth at bedtime.   senna-docusate (SENOKOT-S) 8.6-50 MG tablet Take 1 tablet by mouth 2 (two) times daily.   tetrahydrozoline-zinc (VISINE-AC) 0.05-0.25 % ophthalmic solution Place 2 drops into both eyes 3 (three) times daily as needed (for itching ot redness).    thiamine 100 MG tablet Take 1 tablet (100 mg total) by mouth daily.   No facility-administered encounter medications on file as of 07/18/2022.   BP Readings from Last 3 Encounters:  12/10/21 (!) 92/56  11/10/21 102/60  06/22/21 (!) 98/59    Pulse Readings from Last 3 Encounters:  12/10/21 68  11/10/21 60  06/22/21 71    Lab Results  Component Value Date/Time   HGBA1C  09/13/2008 03:30 AM    5.8 (NOTE)   The ADA recommends the following therapeutic goal for glycemic   control related to Hgb A1C  measurement:   Goal of Therapy:   < 7.0% Hgb A1C   Reference: American Diabetes Association: Clinical Practice   Recommendations 2008, Diabetes Care,  2008, 31:(Suppl 1).   Lab Results  Component Value Date   CREATININE 0.96 09/09/2020   BUN 25 (H) 09/09/2020   GFR 95.90 07/10/2017   GFRNONAA >60 09/09/2020   GFRAA >60 08/28/2019   NA 135 09/09/2020   K 3.9 09/09/2020   CALCIUM 8.8 (L) 09/09/2020   CO2 26 09/09/2020   [place] Pharmacist Assistant [phone]

## 2022-07-22 ENCOUNTER — Ambulatory Visit: Payer: Medicare Other | Admitting: Physician Assistant

## 2022-07-22 ENCOUNTER — Encounter: Payer: Self-pay | Admitting: Physician Assistant

## 2022-08-16 ENCOUNTER — Other Ambulatory Visit: Payer: Self-pay | Admitting: Family Medicine

## 2022-08-16 DIAGNOSIS — F0280 Dementia in other diseases classified elsewhere without behavioral disturbance: Secondary | ICD-10-CM

## 2022-08-17 ENCOUNTER — Telehealth: Payer: Self-pay

## 2022-08-17 NOTE — Progress Notes (Signed)
Care Management & Coordination Services Pharmacy Team  Reason for Encounter: Medication coordination and delivery  Contacted patient to discuss medications and coordinate delivery from [facility]. Spoke with family on 08/17/2022 Spoke with patients daughter  Cycle dispensing form sent to [facility] for review.   Last adherence delivery date:07/28/2022      Patient is due for next adherence delivery on: 08/29/2022  This delivery to include: Adherence Packaging  30 Days  Aspirin 81 mg - one tablet at bedtime Docusate 100 mg - 1 capsule at breakfast and at bedtime Vytorin 10-40 mg - 1 tablet at breakfast Seroquel 25 mg - one tablet twice daily(vial) Donepezil 10 mg - one tablet at breakfast Metoprolol 25 mg - 1 tablet at breakfast Memantine 10 mg - one at breakfast and bedtime Mirtazapine 15 mg - one tablet at bedtime  Patient declined the following medications this month: Allopurinol 300 mg - 1 tablet every morning - pt has plenty on hand   Confirmed delivery date of 08/29/22, advised patient that pharmacy will contact them the morning of delivery.  Any concerns about your medications? Daughter denies  How often do you forget or accidentally miss a dose? Daughter denies  Is patient in packaging Yes  What is the date on your next pill pack? 08/17/2022  Any concerns or issues with your packaging? Daughter denies  Recent blood pressure readings are as follows: Patients daughter states she is checking, they are staying steady, his normal, she is not home with the actual readings.   Chart review: Recent office visits:  None  Recent consult visits:  None  Hospital visits:  None  Medications: Outpatient Encounter Medications as of 08/17/2022  Medication Sig   allopurinol (ZYLOPRIM) 300 MG tablet TAKE ONE TABLET BY MOUTH EVERY MORNING   donepezil (ARICEPT) 10 MG tablet TAKE ONE TABLET BY MOUTH EVERY MORNING   doxycycline (VIBRA-TABS) 100 MG tablet Take 1 tablet  (100 mg total) by mouth daily. For rosacea/continuous   ezetimibe-simvastatin (VYTORIN) 10-40 MG tablet TAKE ONE TABLET BY MOUTH EVERY MORNING   memantine (NAMENDA) 10 MG tablet Take 1 tablet (10 mg at night) for 2 weeks, then increase to 1 tablet (10 mg) twice a day   metoprolol succinate (TOPROL-XL) 25 MG 24 hr tablet TAKE ONE TABLET BY MOUTH EVERY MORNING Patient needs to schedule follow up appt for further refills   mirtazapine (REMERON) 15 MG tablet TAKE ONE TABLET BY MOUTH EVERYDAY AT BEDTIME   Multiple Vitamin (MULTIVITAMIN WITH MINERALS) TABS tablet Take 1 tablet by mouth daily.   ondansetron (ZOFRAN) 4 MG tablet Take 1 tablet (4 mg total) by mouth every 6 (six) hours as needed for nausea.   polyethylene glycol (MIRALAX / GLYCOLAX) 17 g packet Take 17 g by mouth daily as needed for mild constipation.   QUEtiapine (SEROQUEL) 50 MG tablet Take 1 tablet (50 mg total) by mouth at bedtime.   senna-docusate (SENOKOT-S) 8.6-50 MG tablet Take 1 tablet by mouth 2 (two) times daily.   tetrahydrozoline-zinc (VISINE-AC) 0.05-0.25 % ophthalmic solution Place 2 drops into both eyes 3 (three) times daily as needed (for itching ot redness).    thiamine 100 MG tablet Take 1 tablet (100 mg total) by mouth daily.   No facility-administered encounter medications on file as of 08/17/2022.   BP Readings from Last 3 Encounters:  12/10/21 (!) 92/56  11/10/21 102/60  06/22/21 (!) 98/59    Pulse Readings from Last 3 Encounters:  12/10/21 68  11/10/21 60  06/22/21 71    Lab Results  Component Value Date/Time   HGBA1C  09/13/2008 03:30 AM    5.8 (NOTE)   The ADA recommends the following therapeutic goal for glycemic   control related to Hgb A1C measurement:   Goal of Therapy:   < 7.0% Hgb A1C   Reference: American Diabetes Association: Clinical Practice   Recommendations 2008, Diabetes Care,  2008, 31:(Suppl 1).   Lab Results  Component Value Date   CREATININE 0.96 09/09/2020   BUN 25 (H) 09/09/2020    GFR 95.90 07/10/2017   GFRNONAA >60 09/09/2020   GFRAA >60 08/28/2019   NA 135 09/09/2020   K 3.9 09/09/2020   CALCIUM 8.8 (L) 09/09/2020   CO2 26 09/09/2020   [place] Pharmacist Assistant [phone]

## 2022-09-14 ENCOUNTER — Telehealth: Payer: Self-pay

## 2022-09-14 NOTE — Progress Notes (Signed)
Care Management & Coordination Services Pharmacy Team  Reason for Encounter: Medication coordination and delivery  Contacted patient to discuss medications and coordinate delivery from [facility]. Spoke with family, patients daughter [name] on 09/14/2022   Cycle dispensing form sent to [facility] for review.   Last adherence delivery date:08/29/2022      Patient is due for next adherence delivery on: 09/27/2022  This delivery to include: Adherence Packaging  30 Days  Aspirin 81 mg - one tablet at bedtime Docusate 100 mg - 1 capsule at breakfast and at bedtime Vytorin 10-40 mg - 1 tablet at breakfast Seroquel 50 mg - one tablet twice daily(vial) Donepezil 10 mg - one tablet at breakfast Metoprolol 25 mg - 1 tablet at breakfast Memantine 10 mg - one at breakfast and bedtime Mirtazapine 15 mg - one tablet at bedtime  Patient declined the following medications this month: Allopurinol 300 mg - 1 tablet every morning - pt has plenty on hand    Confirmed delivery date of 09/27/2022, advised patient that pharmacy will contact them the morning of delivery.  Any concerns about your medications? Patients daughter denies, she states Seroquel is working pretty well, she has discovered during the full moon is when he has some mild issues with not sleeping well.   How often do you forget or accidentally miss a dose? Patients daughter denies  Is patient in packaging Yes  If yes  What is the date on your next pill pack? 09/14/2022  Any concerns or issues with your packaging? Patients daughter denies.   Recent blood pressure readings are as follows: 09/14/22 - 100/71,  09/13/22 -103/72, 09/12/22 - 101/69 and 09/11/22 - 105/75  Is patient still having low blood pressures? Patients daughter states his blood pressures seem to be staying regular, no low readings.    Chart review: Recent office visits:  None  Recent consult visits:  None  Hospital visits:   None  Medications: Outpatient Encounter Medications as of 09/14/2022  Medication Sig   allopurinol (ZYLOPRIM) 300 MG tablet TAKE ONE TABLET BY MOUTH EVERY MORNING   donepezil (ARICEPT) 10 MG tablet TAKE ONE TABLET BY MOUTH EVERY MORNING   doxycycline (VIBRA-TABS) 100 MG tablet Take 1 tablet (100 mg total) by mouth daily. For rosacea/continuous   ezetimibe-simvastatin (VYTORIN) 10-40 MG tablet TAKE ONE TABLET BY MOUTH EVERY MORNING   memantine (NAMENDA) 10 MG tablet Take 1 tablet (10 mg at night) for 2 weeks, then increase to 1 tablet (10 mg) twice a day   metoprolol succinate (TOPROL-XL) 25 MG 24 hr tablet TAKE ONE TABLET BY MOUTH EVERY MORNING Patient needs to schedule follow up appt for further refills   mirtazapine (REMERON) 15 MG tablet TAKE ONE TABLET BY MOUTH EVERYDAY AT BEDTIME   Multiple Vitamin (MULTIVITAMIN WITH MINERALS) TABS tablet Take 1 tablet by mouth daily.   ondansetron (ZOFRAN) 4 MG tablet Take 1 tablet (4 mg total) by mouth every 6 (six) hours as needed for nausea.   polyethylene glycol (MIRALAX / GLYCOLAX) 17 g packet Take 17 g by mouth daily as needed for mild constipation.   QUEtiapine (SEROQUEL) 50 MG tablet Take 1 tablet (50 mg total) by mouth at bedtime.   senna-docusate (SENOKOT-S) 8.6-50 MG tablet Take 1 tablet by mouth 2 (two) times daily.   tetrahydrozoline-zinc (VISINE-AC) 0.05-0.25 % ophthalmic solution Place 2 drops into both eyes 3 (three) times daily as needed (for itching ot redness).    thiamine 100 MG tablet Take 1 tablet (100 mg  total) by mouth daily.   No facility-administered encounter medications on file as of 09/14/2022.   BP Readings from Last 3 Encounters:  12/10/21 (!) 92/56  11/10/21 102/60  06/22/21 (!) 98/59    Pulse Readings from Last 3 Encounters:  12/10/21 68  11/10/21 60  06/22/21 71    Lab Results  Component Value Date/Time   HGBA1C  09/13/2008 03:30 AM    5.8 (NOTE)   The ADA recommends the following therapeutic goal for  glycemic   control related to Hgb A1C measurement:   Goal of Therapy:   < 7.0% Hgb A1C   Reference: American Diabetes Association: Clinical Practice   Recommendations 2008, Diabetes Care,  2008, 31:(Suppl 1).   Lab Results  Component Value Date   CREATININE 0.96 09/09/2020   BUN 25 (H) 09/09/2020   GFR 95.90 07/10/2017   GFRNONAA >60 09/09/2020   GFRAA >60 08/28/2019   NA 135 09/09/2020   K 3.9 09/09/2020   CALCIUM 8.8 (L) 09/09/2020   CO2 26 09/09/2020   [place] Pharmacist Assistant [phone]

## 2022-10-14 ENCOUNTER — Telehealth: Payer: Self-pay

## 2022-10-14 NOTE — Progress Notes (Unsigned)
Care Management & Coordination Services Pharmacy Team  Reason for Encounter: Medication coordination and delivery  Contacted patient to discuss medications and coordinate delivery from [facility]. {US HC Outreach:28874}  Cycle dispensing form sent to *** for review. SCHEDULE FOR JUN JUL Last adherence delivery date:09/27/2022      Patient is due for next adherence delivery on: 10/27/2022  This delivery to include: Adherence Packaging  30 Days  Aspirin 81 mg - one tablet at bedtime Docusate 100 mg - 1 capsule at breakfast and at bedtime Vytorin 10-40 mg - 1 tablet at breakfast Seroquel 50 mg - one tablet at bedtime (vial) Donepezil 10 mg - one tablet at breakfast Metoprolol 25 mg - 1 tablet at breakfast Memantine 10 mg - one at breakfast and bedtime Mirtazapine 15 mg - one tablet at bedtime  Patient declined the following medications this month: Allopurinol 300 mg - 1 tablet every morning - pt has plenty on hand   {Delivery date:25786}  Any concerns about your medications? {yes/no:20286}  How often do you forget or accidentally miss a dose? {Missed doses:25554}  Is patient in packaging {yes/no:20286}  If yes  What is the date on your next pill pack?  Any concerns or issues with your packaging?   Recent blood pressure readings are as follows:***  Recent blood glucose readings are as follows:***   Chart review: Recent office visits:  None  Recent consult visits:  None  Hospital visits:  None  Medications: Outpatient Encounter Medications as of 10/14/2022  Medication Sig   allopurinol (ZYLOPRIM) 300 MG tablet TAKE ONE TABLET BY MOUTH EVERY MORNING   donepezil (ARICEPT) 10 MG tablet TAKE ONE TABLET BY MOUTH EVERY MORNING   doxycycline (VIBRA-TABS) 100 MG tablet Take 1 tablet (100 mg total) by mouth daily. For rosacea/continuous   ezetimibe-simvastatin (VYTORIN) 10-40 MG tablet TAKE ONE TABLET BY MOUTH EVERY MORNING   memantine (NAMENDA) 10 MG tablet Take  1 tablet (10 mg at night) for 2 weeks, then increase to 1 tablet (10 mg) twice a day   metoprolol succinate (TOPROL-XL) 25 MG 24 hr tablet TAKE ONE TABLET BY MOUTH EVERY MORNING Patient needs to schedule follow up appt for further refills   mirtazapine (REMERON) 15 MG tablet TAKE ONE TABLET BY MOUTH EVERYDAY AT BEDTIME   Multiple Vitamin (MULTIVITAMIN WITH MINERALS) TABS tablet Take 1 tablet by mouth daily.   ondansetron (ZOFRAN) 4 MG tablet Take 1 tablet (4 mg total) by mouth every 6 (six) hours as needed for nausea.   polyethylene glycol (MIRALAX / GLYCOLAX) 17 g packet Take 17 g by mouth daily as needed for mild constipation.   QUEtiapine (SEROQUEL) 50 MG tablet Take 1 tablet (50 mg total) by mouth at bedtime.   senna-docusate (SENOKOT-S) 8.6-50 MG tablet Take 1 tablet by mouth 2 (two) times daily.   tetrahydrozoline-zinc (VISINE-AC) 0.05-0.25 % ophthalmic solution Place 2 drops into both eyes 3 (three) times daily as needed (for itching ot redness).    thiamine 100 MG tablet Take 1 tablet (100 mg total) by mouth daily.   No facility-administered encounter medications on file as of 10/14/2022.   BP Readings from Last 3 Encounters:  12/10/21 (!) 92/56  11/10/21 102/60  06/22/21 (!) 98/59    Pulse Readings from Last 3 Encounters:  12/10/21 68  11/10/21 60  06/22/21 71    Lab Results  Component Value Date/Time   HGBA1C  09/13/2008 03:30 AM    5.8 (NOTE)   The ADA recommends the following  therapeutic goal for glycemic   control related to Hgb A1C measurement:   Goal of Therapy:   < 7.0% Hgb A1C   Reference: American Diabetes Association: Clinical Practice   Recommendations 2008, Diabetes Care,  2008, 31:(Suppl 1).   Lab Results  Component Value Date   CREATININE 0.96 09/09/2020   BUN 25 (H) 09/09/2020   GFR 95.90 07/10/2017   GFRNONAA >60 09/09/2020   GFRAA >60 08/28/2019   NA 135 09/09/2020   K 3.9 09/09/2020   CALCIUM 8.8 (L) 09/09/2020   CO2 26 09/09/2020   Inetta Fermo  CMA  Clinical Pharmacist Assistant [phone]

## 2022-10-25 ENCOUNTER — Telehealth: Payer: Self-pay

## 2022-10-25 ENCOUNTER — Other Ambulatory Visit: Payer: Self-pay | Admitting: Family Medicine

## 2022-10-25 NOTE — Telephone Encounter (Signed)
-----   Message from Sherrill Raring, [facility] sent at 10/25/2022  3:22 PM EDT ----- Regarding: Med Refill Request [facility] requesting refills on behalf of patient for the following medications:  Metoprolol XL 25mg  1 tab by mouth every morning Mirtazapine 15mg  1 tab by mouth daily at bedtime  Pharmacy Info: [facility] - [place], [place] - [address]  Phone: [phone] Fax: [phone]  Patient is in med sync packaging and due for delivery on 10/27/22.   Thank you! Sherrill Raring Clinical Pharmacist [phone]

## 2022-10-25 NOTE — Telephone Encounter (Signed)
Rx sent 

## 2022-11-09 ENCOUNTER — Encounter: Payer: Self-pay | Admitting: Physician Assistant

## 2022-11-10 NOTE — Telephone Encounter (Signed)
He needs to contact Dr. Caryl Never, he is the prescribing MD. They can always go up on the med. Thanks

## 2022-11-15 ENCOUNTER — Telehealth: Payer: Self-pay

## 2022-11-15 NOTE — Progress Notes (Unsigned)
Care Management & Coordination Services Pharmacy Team  Reason for Encounter: Medication coordination and delivery  Contacted patient to discuss medications and coordinate delivery from [facility]. {US HC Outreach:28874}  Cycle dispensing form sent to *** for review.   Last adherence delivery date: 10/27/2022      Patient is due for next adherence delivery on: 11/25/2022  This delivery to include: Adherence Packaging  30 Days  Aspirin 81 mg - one tablet at bedtime Docusate 100 mg - 1 capsule at breakfast and at bedtime Vytorin 10-40 mg - 1 tablet at breakfast Seroquel 50 mg - one tablet at bedtime (vial) - Dtr sent msg to neuro to increase this med or change, she is to call me back as soon as she has an answer.  Donepezil 10 mg - one tablet at breakfast Metoprolol 25 mg - 1 tablet at breakfast Memantine 10 mg - one at breakfast and bedtime Mirtazapine 15 mg - one tablet at bedtime  Patient declined the following medications this month: Allopurinol 300 mg - 1 tablet every morning - pt has plenty on hand    Confirmed delivery date of 11/25/2022, advised patient that pharmacy will contact them the morning of delivery.  Any concerns about your medications? Daughter denies  How often do you forget or accidentally miss a dose? Daughter denies  Is patient in packaging Yes  If yes  What is the date on your next pill pack? 11/15/2022  Any concerns or issues with your packaging? Daughter denies   Recent blood pressure readings are as follows: Daughter declines, states readings are in good range    Chart review: Recent office visits:  None  Recent consult visits:  None  Hospital visits:  None  Medications: Outpatient Encounter Medications as of 11/15/2022  Medication Sig   allopurinol (ZYLOPRIM) 300 MG tablet TAKE ONE TABLET BY MOUTH EVERY MORNING   donepezil (ARICEPT) 10 MG tablet TAKE ONE TABLET BY MOUTH EVERY MORNING   doxycycline (VIBRA-TABS) 100 MG tablet Take  1 tablet (100 mg total) by mouth daily. For rosacea/continuous   ezetimibe-simvastatin (VYTORIN) 10-40 MG tablet TAKE ONE TABLET BY MOUTH EVERY MORNING   memantine (NAMENDA) 10 MG tablet Take 1 tablet (10 mg at night) for 2 weeks, then increase to 1 tablet (10 mg) twice a day   metoprolol succinate (TOPROL-XL) 25 MG 24 hr tablet TAKE ONE TABLET BY MOUTH EVERY MORNING Please schedule and appt for further refills per note from Dr   mirtazapine (REMERON) 15 MG tablet TAKE ONE TABLET BY MOUTH EVERYDAY AT BEDTIME   Multiple Vitamin (MULTIVITAMIN WITH MINERALS) TABS tablet Take 1 tablet by mouth daily.   ondansetron (ZOFRAN) 4 MG tablet Take 1 tablet (4 mg total) by mouth every 6 (six) hours as needed for nausea.   polyethylene glycol (MIRALAX / GLYCOLAX) 17 g packet Take 17 g by mouth daily as needed for mild constipation.   QUEtiapine (SEROQUEL) 50 MG tablet Take 1 tablet (50 mg total) by mouth at bedtime.   senna-docusate (SENOKOT-S) 8.6-50 MG tablet Take 1 tablet by mouth 2 (two) times daily.   tetrahydrozoline-zinc (VISINE-AC) 0.05-0.25 % ophthalmic solution Place 2 drops into both eyes 3 (three) times daily as needed (for itching ot redness).    thiamine 100 MG tablet Take 1 tablet (100 mg total) by mouth daily.   No facility-administered encounter medications on file as of 11/15/2022.   BP Readings from Last 3 Encounters:  12/10/21 (!) 92/56  11/10/21 102/60  06/22/21 (!) 98/59  Pulse Readings from Last 3 Encounters:  12/10/21 68  11/10/21 60  06/22/21 71    Lab Results  Component Value Date/Time   HGBA1C  09/13/2008 03:30 AM    5.8 (NOTE)   The ADA recommends the following therapeutic goal for glycemic   control related to Hgb A1C measurement:   Goal of Therapy:   < 7.0% Hgb A1C   Reference: American Diabetes Association: Clinical Practice   Recommendations 2008, Diabetes Care,  2008, 31:(Suppl 1).   Lab Results  Component Value Date   CREATININE 0.96 09/09/2020   BUN 25 (H)  09/09/2020   GFR 95.90 07/10/2017   GFRNONAA >60 09/09/2020   GFRAA >60 08/28/2019   NA 135 09/09/2020   K 3.9 09/09/2020   CALCIUM 8.8 (L) 09/09/2020   CO2 26 09/09/2020   Inetta Fermo CMA  Clinical Pharmacist Assistant [phone]

## 2022-11-16 ENCOUNTER — Encounter: Payer: Self-pay | Admitting: Family Medicine

## 2022-11-17 MED ORDER — QUETIAPINE FUMARATE 100 MG PO TABS: 100.0000 mg | ORAL_TABLET | Freq: Every day | ORAL | 0 refills | Status: DC

## 2022-11-22 ENCOUNTER — Other Ambulatory Visit: Payer: Self-pay | Admitting: Family Medicine

## 2022-11-22 ENCOUNTER — Other Ambulatory Visit: Payer: Self-pay | Admitting: Physician Assistant

## 2022-11-23 NOTE — Progress Notes (Cosign Needed)
Spoke with patients daughter Tobi Bastos, she is aware patient will need an appointment with Dr. Marlowe Kays and Dr Caryl Never before they will refill memantine and mirtazapine.

## 2022-12-19 ENCOUNTER — Other Ambulatory Visit: Payer: Self-pay | Admitting: Family Medicine

## 2023-01-23 ENCOUNTER — Other Ambulatory Visit: Payer: Self-pay | Admitting: Family Medicine

## 2023-02-10 ENCOUNTER — Encounter: Payer: Self-pay | Admitting: Family Medicine

## 2023-03-13 ENCOUNTER — Encounter: Payer: Self-pay | Admitting: Family Medicine

## 2023-03-13 ENCOUNTER — Telehealth (INDEPENDENT_AMBULATORY_CARE_PROVIDER_SITE_OTHER): Payer: Medicare Other | Admitting: Family Medicine

## 2023-03-13 DIAGNOSIS — F02818 Dementia in other diseases classified elsewhere, unspecified severity, with other behavioral disturbance: Secondary | ICD-10-CM

## 2023-03-13 DIAGNOSIS — H109 Unspecified conjunctivitis: Secondary | ICD-10-CM

## 2023-03-13 DIAGNOSIS — G309 Alzheimer's disease, unspecified: Secondary | ICD-10-CM | POA: Diagnosis not present

## 2023-03-13 MED ORDER — POLYMYXIN B-TRIMETHOPRIM 10000-0.1 UNIT/ML-% OP SOLN: 2.0000 [drp] | Freq: Four times a day (QID) | OPHTHALMIC | 0 refills | Status: DC

## 2023-03-13 MED ORDER — ESCITALOPRAM OXALATE 5 MG PO TABS: 5.0000 mg | ORAL_TABLET | Freq: Every day | ORAL | 3 refills | Status: DC

## 2023-03-13 MED ORDER — LORAZEPAM 0.5 MG PO TABS: ORAL_TABLET | ORAL | 0 refills | Status: DC

## 2023-03-13 NOTE — Patient Instructions (Signed)
STOP the Mirtazepine  Start Lexapro 5 mg once daily  May use the Lorazepam 0.5 mg one every 6 hours as needed for severe agitation.

## 2023-03-13 NOTE — Progress Notes (Signed)
Patient ID: Jesus Jordan, male   DOB: Aug 09, 1945, 77 y.o.   MRN: [id number]   Virtual Visit via Video Note  I connected with Kathline Magic on 03/13/23 at  1:45 PM EDT by a video enabled telemedicine application and verified that I am speaking with the correct person using two identifiers.  Location patient: home Location provider:work or home office Persons participating in the virtual visit: patient, provider, and patient's daughter-Jesus Jordan  I discussed the limitations of evaluation and management by telemedicine and the availability of in person appointments. The patient expressed understanding and agreed to proceed.   HPI:  Obtained information from Jesus Jordan who is his primary caregiver.  Patient has advanced dementia and unable to get history from patient.   Patient has advanced dementia.  He is recently become more verbally and occasionally physically aggressive toward caregivers.  They have home care agency that comes out to provide care and recently on a couple of occasions he has been physically aggressive with male nurses.  He has been very reluctant to cooperate with things like bathing.  Daughter states his appetite has been good.  He is actually had some weight gain.  He currently takes Aricept, Remeron at night, and Seroquel 100 mg nightly.  She feels like his agitation has not been any improved with his Seroquel.  He does not seem to be any pain.  He with the Remeron does not seem to help his sleeping any.  He tends to nap a lot during the day and frequently up at night.  Her issues daughter relates has had several week history of some redness of the left eye with goopy purulent type drainage.   ROS: See pertinent positives and negatives per HPI.  Past Medical History:  Diagnosis Date   Alcohol abuse, daily use    05/29/20 - Reported consuming a 6 pack per day   CAD in native artery 05/03/2016   COPD (chronic obstructive pulmonary disease) 12/10/2018   Coronary atherosclerosis  01/29/2009   Essential hypertension 12/10/2018   GERD (gastroesophageal reflux disease) 04/18/2018   Gout    Hearing loss    History of MI (myocardial infarction) 09/12/2008   large inferolateral MI secondary to total occlusion of circumflex with VF cardiac arrest   History of nuclear stress test 02/23/2012   exercise myoview; area of scar in inferolateral wall at mid-basal region   Hyperlipidemia 03/06/2013   target LDL less than 70   Inguinal hernia 05/25/2009   Major neurocognitive disorder due to multiple etiologies without behavioral disturbance 05/29/2020    Past Surgical History:  Procedure Laterality Date   CORONARY STENT PLACEMENT  09/12/2008   r/t MI; 3.5x66mm Xience DES to circumflex & PTCA at multiple sites in distal region of vessel; alos diffusely diseaase, narrowed, nondominant RCA   FEMUR IM NAIL Right 09/03/2020   Procedure: INTRAMEDULLARY (IM) NAIL FEMORAL;  Surgeon: Samson Frederic, MD;  Location: [facility];  Service: Orthopedics;  Laterality: Right;   FOOT SURGERY  1960   HERNIA REPAIR  2011   left inguinal hernia   TRANSTHORACIC ECHOCARDIOGRAM  03/02/2009   EF=50-55%; LV systolic function borderline reduced with mild inferior wall hypokinesis; mild-mod MR; mild TR; AV mildly sclerotic; mild aortic root dilatation    Family History  Problem Relation Age of Onset   Colon cancer Father    Alcoholism Father    Hyperlipidemia Brother    Heart disease Brother 21       massive MI   Raynaud syndrome Brother  Cancer Paternal Grandfather        colon   Dementia Sister        Unspecified type    SOCIAL HX: Patient has 24/7 care secondary to advanced dementia   Current Outpatient Medications:    allopurinol (ZYLOPRIM) 300 MG tablet, TAKE ONE TABLET BY MOUTH EVERY MORNING, Disp: 30 tablet, Rfl: 2   donepezil (ARICEPT) 10 MG tablet, TAKE ONE TABLET BY MOUTH EVERY MORNING, Disp: 30 tablet, Rfl: 0   escitalopram (LEXAPRO) 5 MG tablet, Take 1 tablet (5 mg total) by  mouth daily., Disp: 90 tablet, Rfl: 3   ezetimibe-simvastatin (VYTORIN) 10-40 MG tablet, TAKE ONE TABLET BY MOUTH EVERY MORNING, Disp: 90 tablet, Rfl: 3   LORazepam (ATIVAN) 0.5 MG tablet, 1 tablet by mouth every 6 hours as needed for extreme agitation, Disp: 30 tablet, Rfl: 0   metoprolol succinate (TOPROL-XL) 25 MG 24 hr tablet, TAKE ONE TABLET BY MOUTH EVERY MORNING Please schedule and appt for further refills per note from Dr, Brantley Persons: 90 tablet, Rfl: 0   Multiple Vitamin (MULTIVITAMIN WITH MINERALS) TABS tablet, Take 1 tablet by mouth daily., Disp: , Rfl:    ondansetron (ZOFRAN) 4 MG tablet, Take 1 tablet (4 mg total) by mouth every 6 (six) hours as needed for nausea., Disp: 20 tablet, Rfl: 0   polyethylene glycol (MIRALAX / GLYCOLAX) 17 g packet, Take 17 g by mouth daily as needed for mild constipation., Disp: 14 each, Rfl: 0   QUEtiapine (SEROQUEL) 100 MG tablet, Take 1 tablet (100 mg total) by mouth at bedtime., Disp: 90 tablet, Rfl: 0   senna-docusate (SENOKOT-S) 8.6-50 MG tablet, Take 1 tablet by mouth 2 (two) times daily., Disp: , Rfl:    tetrahydrozoline-zinc (VISINE-AC) 0.05-0.25 % ophthalmic solution, Place 2 drops into both eyes 3 (three) times daily as needed (for itching ot redness). , Disp: , Rfl:    thiamine 100 MG tablet, Take 1 tablet (100 mg total) by mouth daily., Disp: , Rfl:    trimethoprim-polymyxin b (POLYTRIM) ophthalmic solution, Place 2 drops into the left eye every 6 (six) hours., Disp: 10 mL, Rfl: 0  EXAM:  VITALS per patient if applicable:  GENERAL: alert, oriented, appears well and in no acute distress  HEENT: atraumatic, no obvious abnormalities on inspection of external nose and ears .    left eye erythematous-following conjunctivo-  NECK: normal movements of the head and neck  LUNGS: on inspection no signs of respiratory distress, breathing rate appears normal, no obvious gross SOB, gasping or wheezing  CV: no obvious cyanosis  MS: moves all visible  extremities without noticeable abnormality  PSYCH/NEURO: pleasant and cooperative, no obvious depression or anxiety, speech and thought processing grossly intact  ASSESSMENT AND PLAN:  Discussed the following assessment and plan:  #1 probable bacterial conjunctivitis left eye.  He has not been cooperative with warm compresses.  Send in Polytrim ophthalmic drops to use 2 drops left eye every 6 hours until clear  #2 advanced Alzheimer's dementia with behavioral disturbance.  He had some recent verbal and physical aggression.  Already on Seroquel and Aricept.  -Discontinue Remeron -Start Lexapro 5 mg once daily.  Give feedback in a few weeks.  May need to titrate Lexapro further at time -Continue Seroquel 100 mg once daily -Wrote for very limited lorazepam 0.5 mg use only 1 every 6 hours as needed for extreme aggression -If not responding to above consider possible change to Abilify or Risperdal and off the Seroquel  I discussed the assessment and treatment plan with the patient. The patient was provided an opportunity to ask questions and all were answered. The patient agreed with the plan and demonstrated an understanding of the instructions.   The patient was advised to call back or seek an in-person evaluation if the symptoms worsen or if the condition fails to improve as anticipated.     Evelena Peat, MD

## 2023-03-21 ENCOUNTER — Telehealth: Payer: Self-pay | Admitting: Family Medicine

## 2023-03-21 NOTE — Telephone Encounter (Signed)
I spoke with Marny Lowenstein and she was given clarification on Lorazepam 0.5 mg

## 2023-03-21 NOTE — Telephone Encounter (Signed)
Regarding PA for LORazepam (ATIVAN) 0.5 MG tablet, wants to know if member has anxiety, agitation associated with psychotic disorder, please provide any additional related diagnoses

## 2023-03-22 ENCOUNTER — Telehealth: Payer: Self-pay | Admitting: Family Medicine

## 2023-03-22 NOTE — Telephone Encounter (Signed)
Noted  

## 2023-03-22 NOTE — Telephone Encounter (Signed)
Jesus Jordan with [facility] [employer] called and stated the the prescription for Lorazepam has been approved and she will fax over a detailed approval letter. She states if there are any questions her call back number is: [phone] option 5.

## 2023-03-22 NOTE — Telephone Encounter (Signed)
I spoke with the patient daughter and she is aware of approval and to contact pharmacy to have prescription refilled.

## 2023-04-06 ENCOUNTER — Other Ambulatory Visit: Payer: Self-pay | Admitting: Cardiovascular Disease

## 2023-04-18 ENCOUNTER — Encounter: Payer: Self-pay | Admitting: Family Medicine

## 2023-04-18 DIAGNOSIS — F02818 Dementia in other diseases classified elsewhere, unspecified severity, with other behavioral disturbance: Secondary | ICD-10-CM

## 2023-04-18 DIAGNOSIS — Z9181 History of falling: Secondary | ICD-10-CM

## 2023-04-19 ENCOUNTER — Telehealth: Payer: Self-pay | Admitting: Family Medicine

## 2023-04-19 NOTE — Telephone Encounter (Signed)
Jesus Jordan with [facility]  [phone]  The Pt's family would like MD to be provider in care of and they are also requesting Oral CTI  Please return her call, at your earliest convenience.

## 2023-04-19 NOTE — Telephone Encounter (Signed)
Jesus Jordan with [facility] informed of the message below and voiced understanding

## 2023-04-20 DIAGNOSIS — K219 Gastro-esophageal reflux disease without esophagitis: Secondary | ICD-10-CM | POA: Diagnosis not present

## 2023-04-20 DIAGNOSIS — I10 Essential (primary) hypertension: Secondary | ICD-10-CM | POA: Diagnosis not present

## 2023-04-20 DIAGNOSIS — I252 Old myocardial infarction: Secondary | ICD-10-CM | POA: Diagnosis not present

## 2023-04-20 DIAGNOSIS — E785 Hyperlipidemia, unspecified: Secondary | ICD-10-CM | POA: Diagnosis not present

## 2023-04-20 DIAGNOSIS — F32A Depression, unspecified: Secondary | ICD-10-CM | POA: Diagnosis not present

## 2023-04-20 DIAGNOSIS — G309 Alzheimer's disease, unspecified: Secondary | ICD-10-CM | POA: Diagnosis not present

## 2023-04-20 DIAGNOSIS — J449 Chronic obstructive pulmonary disease, unspecified: Secondary | ICD-10-CM | POA: Diagnosis not present

## 2023-04-20 DIAGNOSIS — F1911 Other psychoactive substance abuse, in remission: Secondary | ICD-10-CM | POA: Diagnosis not present

## 2023-04-20 DIAGNOSIS — M109 Gout, unspecified: Secondary | ICD-10-CM | POA: Diagnosis not present

## 2023-04-20 DIAGNOSIS — I25118 Atherosclerotic heart disease of native coronary artery with other forms of angina pectoris: Secondary | ICD-10-CM | POA: Diagnosis not present

## 2023-04-21 DIAGNOSIS — J449 Chronic obstructive pulmonary disease, unspecified: Secondary | ICD-10-CM | POA: Diagnosis not present

## 2023-04-21 DIAGNOSIS — E785 Hyperlipidemia, unspecified: Secondary | ICD-10-CM | POA: Diagnosis not present

## 2023-04-21 DIAGNOSIS — I10 Essential (primary) hypertension: Secondary | ICD-10-CM | POA: Diagnosis not present

## 2023-04-21 DIAGNOSIS — I252 Old myocardial infarction: Secondary | ICD-10-CM | POA: Diagnosis not present

## 2023-04-21 DIAGNOSIS — F1911 Other psychoactive substance abuse, in remission: Secondary | ICD-10-CM | POA: Diagnosis not present

## 2023-04-21 DIAGNOSIS — G309 Alzheimer's disease, unspecified: Secondary | ICD-10-CM | POA: Diagnosis not present

## 2023-04-21 DIAGNOSIS — I25118 Atherosclerotic heart disease of native coronary artery with other forms of angina pectoris: Secondary | ICD-10-CM | POA: Diagnosis not present

## 2023-04-21 DIAGNOSIS — F32A Depression, unspecified: Secondary | ICD-10-CM | POA: Diagnosis not present

## 2023-04-21 DIAGNOSIS — M109 Gout, unspecified: Secondary | ICD-10-CM | POA: Diagnosis not present

## 2023-04-21 DIAGNOSIS — K219 Gastro-esophageal reflux disease without esophagitis: Secondary | ICD-10-CM | POA: Diagnosis not present

## 2023-04-24 DIAGNOSIS — G309 Alzheimer's disease, unspecified: Secondary | ICD-10-CM | POA: Diagnosis not present

## 2023-04-24 DIAGNOSIS — I25118 Atherosclerotic heart disease of native coronary artery with other forms of angina pectoris: Secondary | ICD-10-CM | POA: Diagnosis not present

## 2023-04-24 DIAGNOSIS — I10 Essential (primary) hypertension: Secondary | ICD-10-CM | POA: Diagnosis not present

## 2023-04-24 DIAGNOSIS — J449 Chronic obstructive pulmonary disease, unspecified: Secondary | ICD-10-CM | POA: Diagnosis not present

## 2023-04-24 DIAGNOSIS — E785 Hyperlipidemia, unspecified: Secondary | ICD-10-CM | POA: Diagnosis not present

## 2023-04-24 DIAGNOSIS — K219 Gastro-esophageal reflux disease without esophagitis: Secondary | ICD-10-CM | POA: Diagnosis not present

## 2023-04-26 DIAGNOSIS — E785 Hyperlipidemia, unspecified: Secondary | ICD-10-CM | POA: Diagnosis not present

## 2023-04-26 DIAGNOSIS — G309 Alzheimer's disease, unspecified: Secondary | ICD-10-CM | POA: Diagnosis not present

## 2023-04-26 DIAGNOSIS — J449 Chronic obstructive pulmonary disease, unspecified: Secondary | ICD-10-CM | POA: Diagnosis not present

## 2023-04-26 DIAGNOSIS — I25118 Atherosclerotic heart disease of native coronary artery with other forms of angina pectoris: Secondary | ICD-10-CM | POA: Diagnosis not present

## 2023-04-26 DIAGNOSIS — I10 Essential (primary) hypertension: Secondary | ICD-10-CM | POA: Diagnosis not present

## 2023-04-26 DIAGNOSIS — K219 Gastro-esophageal reflux disease without esophagitis: Secondary | ICD-10-CM | POA: Diagnosis not present

## 2023-04-28 DIAGNOSIS — I25118 Atherosclerotic heart disease of native coronary artery with other forms of angina pectoris: Secondary | ICD-10-CM | POA: Diagnosis not present

## 2023-04-28 DIAGNOSIS — K219 Gastro-esophageal reflux disease without esophagitis: Secondary | ICD-10-CM | POA: Diagnosis not present

## 2023-04-28 DIAGNOSIS — I10 Essential (primary) hypertension: Secondary | ICD-10-CM | POA: Diagnosis not present

## 2023-04-28 DIAGNOSIS — E785 Hyperlipidemia, unspecified: Secondary | ICD-10-CM | POA: Diagnosis not present

## 2023-04-28 DIAGNOSIS — G309 Alzheimer's disease, unspecified: Secondary | ICD-10-CM | POA: Diagnosis not present

## 2023-04-28 DIAGNOSIS — J449 Chronic obstructive pulmonary disease, unspecified: Secondary | ICD-10-CM | POA: Diagnosis not present

## 2023-05-01 DIAGNOSIS — J449 Chronic obstructive pulmonary disease, unspecified: Secondary | ICD-10-CM | POA: Diagnosis not present

## 2023-05-01 DIAGNOSIS — K219 Gastro-esophageal reflux disease without esophagitis: Secondary | ICD-10-CM | POA: Diagnosis not present

## 2023-05-01 DIAGNOSIS — I25118 Atherosclerotic heart disease of native coronary artery with other forms of angina pectoris: Secondary | ICD-10-CM | POA: Diagnosis not present

## 2023-05-01 DIAGNOSIS — G309 Alzheimer's disease, unspecified: Secondary | ICD-10-CM | POA: Diagnosis not present

## 2023-05-01 DIAGNOSIS — I10 Essential (primary) hypertension: Secondary | ICD-10-CM | POA: Diagnosis not present

## 2023-05-01 DIAGNOSIS — E785 Hyperlipidemia, unspecified: Secondary | ICD-10-CM | POA: Diagnosis not present

## 2023-05-05 ENCOUNTER — Other Ambulatory Visit: Payer: Self-pay | Admitting: Cardiovascular Disease

## 2023-05-05 DIAGNOSIS — I25118 Atherosclerotic heart disease of native coronary artery with other forms of angina pectoris: Secondary | ICD-10-CM | POA: Diagnosis not present

## 2023-05-05 DIAGNOSIS — I10 Essential (primary) hypertension: Secondary | ICD-10-CM | POA: Diagnosis not present

## 2023-05-05 DIAGNOSIS — G309 Alzheimer's disease, unspecified: Secondary | ICD-10-CM | POA: Diagnosis not present

## 2023-05-05 DIAGNOSIS — K219 Gastro-esophageal reflux disease without esophagitis: Secondary | ICD-10-CM | POA: Diagnosis not present

## 2023-05-05 DIAGNOSIS — E785 Hyperlipidemia, unspecified: Secondary | ICD-10-CM | POA: Diagnosis not present

## 2023-05-05 DIAGNOSIS — J449 Chronic obstructive pulmonary disease, unspecified: Secondary | ICD-10-CM | POA: Diagnosis not present

## 2023-05-08 DIAGNOSIS — G309 Alzheimer's disease, unspecified: Secondary | ICD-10-CM | POA: Diagnosis not present

## 2023-05-08 DIAGNOSIS — I25118 Atherosclerotic heart disease of native coronary artery with other forms of angina pectoris: Secondary | ICD-10-CM | POA: Diagnosis not present

## 2023-05-08 DIAGNOSIS — K219 Gastro-esophageal reflux disease without esophagitis: Secondary | ICD-10-CM | POA: Diagnosis not present

## 2023-05-08 DIAGNOSIS — I10 Essential (primary) hypertension: Secondary | ICD-10-CM | POA: Diagnosis not present

## 2023-05-08 DIAGNOSIS — J449 Chronic obstructive pulmonary disease, unspecified: Secondary | ICD-10-CM | POA: Diagnosis not present

## 2023-05-08 DIAGNOSIS — E785 Hyperlipidemia, unspecified: Secondary | ICD-10-CM | POA: Diagnosis not present

## 2023-05-10 DIAGNOSIS — G309 Alzheimer's disease, unspecified: Secondary | ICD-10-CM | POA: Diagnosis not present

## 2023-05-10 DIAGNOSIS — E785 Hyperlipidemia, unspecified: Secondary | ICD-10-CM | POA: Diagnosis not present

## 2023-05-10 DIAGNOSIS — I10 Essential (primary) hypertension: Secondary | ICD-10-CM | POA: Diagnosis not present

## 2023-05-10 DIAGNOSIS — K219 Gastro-esophageal reflux disease without esophagitis: Secondary | ICD-10-CM | POA: Diagnosis not present

## 2023-05-10 DIAGNOSIS — I25118 Atherosclerotic heart disease of native coronary artery with other forms of angina pectoris: Secondary | ICD-10-CM | POA: Diagnosis not present

## 2023-05-10 DIAGNOSIS — J449 Chronic obstructive pulmonary disease, unspecified: Secondary | ICD-10-CM | POA: Diagnosis not present

## 2023-05-12 DIAGNOSIS — K219 Gastro-esophageal reflux disease without esophagitis: Secondary | ICD-10-CM | POA: Diagnosis not present

## 2023-05-12 DIAGNOSIS — I10 Essential (primary) hypertension: Secondary | ICD-10-CM | POA: Diagnosis not present

## 2023-05-12 DIAGNOSIS — J449 Chronic obstructive pulmonary disease, unspecified: Secondary | ICD-10-CM | POA: Diagnosis not present

## 2023-05-12 DIAGNOSIS — G309 Alzheimer's disease, unspecified: Secondary | ICD-10-CM | POA: Diagnosis not present

## 2023-05-12 DIAGNOSIS — E785 Hyperlipidemia, unspecified: Secondary | ICD-10-CM | POA: Diagnosis not present

## 2023-05-12 DIAGNOSIS — I25118 Atherosclerotic heart disease of native coronary artery with other forms of angina pectoris: Secondary | ICD-10-CM | POA: Diagnosis not present

## 2023-05-15 DIAGNOSIS — G309 Alzheimer's disease, unspecified: Secondary | ICD-10-CM | POA: Diagnosis not present

## 2023-05-15 DIAGNOSIS — I10 Essential (primary) hypertension: Secondary | ICD-10-CM | POA: Diagnosis not present

## 2023-05-15 DIAGNOSIS — E785 Hyperlipidemia, unspecified: Secondary | ICD-10-CM | POA: Diagnosis not present

## 2023-05-15 DIAGNOSIS — J449 Chronic obstructive pulmonary disease, unspecified: Secondary | ICD-10-CM | POA: Diagnosis not present

## 2023-05-15 DIAGNOSIS — I25118 Atherosclerotic heart disease of native coronary artery with other forms of angina pectoris: Secondary | ICD-10-CM | POA: Diagnosis not present

## 2023-05-15 DIAGNOSIS — K219 Gastro-esophageal reflux disease without esophagitis: Secondary | ICD-10-CM | POA: Diagnosis not present

## 2023-05-17 DIAGNOSIS — I25118 Atherosclerotic heart disease of native coronary artery with other forms of angina pectoris: Secondary | ICD-10-CM | POA: Diagnosis not present

## 2023-05-17 DIAGNOSIS — J449 Chronic obstructive pulmonary disease, unspecified: Secondary | ICD-10-CM | POA: Diagnosis not present

## 2023-05-17 DIAGNOSIS — I10 Essential (primary) hypertension: Secondary | ICD-10-CM | POA: Diagnosis not present

## 2023-05-17 DIAGNOSIS — K219 Gastro-esophageal reflux disease without esophagitis: Secondary | ICD-10-CM | POA: Diagnosis not present

## 2023-05-17 DIAGNOSIS — E785 Hyperlipidemia, unspecified: Secondary | ICD-10-CM | POA: Diagnosis not present

## 2023-05-17 DIAGNOSIS — G309 Alzheimer's disease, unspecified: Secondary | ICD-10-CM | POA: Diagnosis not present

## 2023-05-19 DIAGNOSIS — J449 Chronic obstructive pulmonary disease, unspecified: Secondary | ICD-10-CM | POA: Diagnosis not present

## 2023-05-19 DIAGNOSIS — I10 Essential (primary) hypertension: Secondary | ICD-10-CM | POA: Diagnosis not present

## 2023-05-19 DIAGNOSIS — I25118 Atherosclerotic heart disease of native coronary artery with other forms of angina pectoris: Secondary | ICD-10-CM | POA: Diagnosis not present

## 2023-05-19 DIAGNOSIS — K219 Gastro-esophageal reflux disease without esophagitis: Secondary | ICD-10-CM | POA: Diagnosis not present

## 2023-05-19 DIAGNOSIS — E785 Hyperlipidemia, unspecified: Secondary | ICD-10-CM | POA: Diagnosis not present

## 2023-05-19 DIAGNOSIS — G309 Alzheimer's disease, unspecified: Secondary | ICD-10-CM | POA: Diagnosis not present

## 2023-05-21 DIAGNOSIS — I10 Essential (primary) hypertension: Secondary | ICD-10-CM | POA: Diagnosis not present

## 2023-05-21 DIAGNOSIS — J449 Chronic obstructive pulmonary disease, unspecified: Secondary | ICD-10-CM | POA: Diagnosis not present

## 2023-05-21 DIAGNOSIS — E785 Hyperlipidemia, unspecified: Secondary | ICD-10-CM | POA: Diagnosis not present

## 2023-05-21 DIAGNOSIS — G309 Alzheimer's disease, unspecified: Secondary | ICD-10-CM | POA: Diagnosis not present

## 2023-05-21 DIAGNOSIS — F32A Depression, unspecified: Secondary | ICD-10-CM | POA: Diagnosis not present

## 2023-05-21 DIAGNOSIS — I252 Old myocardial infarction: Secondary | ICD-10-CM | POA: Diagnosis not present

## 2023-05-21 DIAGNOSIS — I25118 Atherosclerotic heart disease of native coronary artery with other forms of angina pectoris: Secondary | ICD-10-CM | POA: Diagnosis not present

## 2023-05-21 DIAGNOSIS — F1911 Other psychoactive substance abuse, in remission: Secondary | ICD-10-CM | POA: Diagnosis not present

## 2023-05-21 DIAGNOSIS — K219 Gastro-esophageal reflux disease without esophagitis: Secondary | ICD-10-CM | POA: Diagnosis not present

## 2023-05-21 DIAGNOSIS — M109 Gout, unspecified: Secondary | ICD-10-CM | POA: Diagnosis not present

## 2023-05-22 DIAGNOSIS — J449 Chronic obstructive pulmonary disease, unspecified: Secondary | ICD-10-CM | POA: Diagnosis not present

## 2023-05-22 DIAGNOSIS — I10 Essential (primary) hypertension: Secondary | ICD-10-CM | POA: Diagnosis not present

## 2023-05-22 DIAGNOSIS — E785 Hyperlipidemia, unspecified: Secondary | ICD-10-CM | POA: Diagnosis not present

## 2023-05-22 DIAGNOSIS — I25118 Atherosclerotic heart disease of native coronary artery with other forms of angina pectoris: Secondary | ICD-10-CM | POA: Diagnosis not present

## 2023-05-22 DIAGNOSIS — K219 Gastro-esophageal reflux disease without esophagitis: Secondary | ICD-10-CM | POA: Diagnosis not present

## 2023-05-22 DIAGNOSIS — G309 Alzheimer's disease, unspecified: Secondary | ICD-10-CM | POA: Diagnosis not present

## 2023-05-24 DIAGNOSIS — K219 Gastro-esophageal reflux disease without esophagitis: Secondary | ICD-10-CM | POA: Diagnosis not present

## 2023-05-24 DIAGNOSIS — J449 Chronic obstructive pulmonary disease, unspecified: Secondary | ICD-10-CM | POA: Diagnosis not present

## 2023-05-24 DIAGNOSIS — G309 Alzheimer's disease, unspecified: Secondary | ICD-10-CM | POA: Diagnosis not present

## 2023-05-24 DIAGNOSIS — E785 Hyperlipidemia, unspecified: Secondary | ICD-10-CM | POA: Diagnosis not present

## 2023-05-24 DIAGNOSIS — I25118 Atherosclerotic heart disease of native coronary artery with other forms of angina pectoris: Secondary | ICD-10-CM | POA: Diagnosis not present

## 2023-05-24 DIAGNOSIS — I10 Essential (primary) hypertension: Secondary | ICD-10-CM | POA: Diagnosis not present

## 2023-05-26 DIAGNOSIS — I10 Essential (primary) hypertension: Secondary | ICD-10-CM | POA: Diagnosis not present

## 2023-05-26 DIAGNOSIS — J449 Chronic obstructive pulmonary disease, unspecified: Secondary | ICD-10-CM | POA: Diagnosis not present

## 2023-05-26 DIAGNOSIS — E785 Hyperlipidemia, unspecified: Secondary | ICD-10-CM | POA: Diagnosis not present

## 2023-05-26 DIAGNOSIS — I25118 Atherosclerotic heart disease of native coronary artery with other forms of angina pectoris: Secondary | ICD-10-CM | POA: Diagnosis not present

## 2023-05-26 DIAGNOSIS — G309 Alzheimer's disease, unspecified: Secondary | ICD-10-CM | POA: Diagnosis not present

## 2023-05-26 DIAGNOSIS — K219 Gastro-esophageal reflux disease without esophagitis: Secondary | ICD-10-CM | POA: Diagnosis not present

## 2023-05-29 DIAGNOSIS — I10 Essential (primary) hypertension: Secondary | ICD-10-CM | POA: Diagnosis not present

## 2023-05-29 DIAGNOSIS — G309 Alzheimer's disease, unspecified: Secondary | ICD-10-CM | POA: Diagnosis not present

## 2023-05-29 DIAGNOSIS — I25118 Atherosclerotic heart disease of native coronary artery with other forms of angina pectoris: Secondary | ICD-10-CM | POA: Diagnosis not present

## 2023-05-29 DIAGNOSIS — J449 Chronic obstructive pulmonary disease, unspecified: Secondary | ICD-10-CM | POA: Diagnosis not present

## 2023-05-29 DIAGNOSIS — E785 Hyperlipidemia, unspecified: Secondary | ICD-10-CM | POA: Diagnosis not present

## 2023-05-29 DIAGNOSIS — K219 Gastro-esophageal reflux disease without esophagitis: Secondary | ICD-10-CM | POA: Diagnosis not present

## 2023-05-31 DIAGNOSIS — I10 Essential (primary) hypertension: Secondary | ICD-10-CM | POA: Diagnosis not present

## 2023-05-31 DIAGNOSIS — G309 Alzheimer's disease, unspecified: Secondary | ICD-10-CM | POA: Diagnosis not present

## 2023-05-31 DIAGNOSIS — K219 Gastro-esophageal reflux disease without esophagitis: Secondary | ICD-10-CM | POA: Diagnosis not present

## 2023-05-31 DIAGNOSIS — I25118 Atherosclerotic heart disease of native coronary artery with other forms of angina pectoris: Secondary | ICD-10-CM | POA: Diagnosis not present

## 2023-05-31 DIAGNOSIS — E785 Hyperlipidemia, unspecified: Secondary | ICD-10-CM | POA: Diagnosis not present

## 2023-05-31 DIAGNOSIS — J449 Chronic obstructive pulmonary disease, unspecified: Secondary | ICD-10-CM | POA: Diagnosis not present

## 2023-06-01 DIAGNOSIS — I10 Essential (primary) hypertension: Secondary | ICD-10-CM | POA: Diagnosis not present

## 2023-06-01 DIAGNOSIS — E785 Hyperlipidemia, unspecified: Secondary | ICD-10-CM | POA: Diagnosis not present

## 2023-06-01 DIAGNOSIS — K219 Gastro-esophageal reflux disease without esophagitis: Secondary | ICD-10-CM | POA: Diagnosis not present

## 2023-06-01 DIAGNOSIS — I25118 Atherosclerotic heart disease of native coronary artery with other forms of angina pectoris: Secondary | ICD-10-CM | POA: Diagnosis not present

## 2023-06-01 DIAGNOSIS — J449 Chronic obstructive pulmonary disease, unspecified: Secondary | ICD-10-CM | POA: Diagnosis not present

## 2023-06-01 DIAGNOSIS — G309 Alzheimer's disease, unspecified: Secondary | ICD-10-CM | POA: Diagnosis not present

## 2023-06-02 DIAGNOSIS — I25118 Atherosclerotic heart disease of native coronary artery with other forms of angina pectoris: Secondary | ICD-10-CM | POA: Diagnosis not present

## 2023-06-02 DIAGNOSIS — E785 Hyperlipidemia, unspecified: Secondary | ICD-10-CM | POA: Diagnosis not present

## 2023-06-02 DIAGNOSIS — G309 Alzheimer's disease, unspecified: Secondary | ICD-10-CM | POA: Diagnosis not present

## 2023-06-02 DIAGNOSIS — I10 Essential (primary) hypertension: Secondary | ICD-10-CM | POA: Diagnosis not present

## 2023-06-02 DIAGNOSIS — J449 Chronic obstructive pulmonary disease, unspecified: Secondary | ICD-10-CM | POA: Diagnosis not present

## 2023-06-02 DIAGNOSIS — K219 Gastro-esophageal reflux disease without esophagitis: Secondary | ICD-10-CM | POA: Diagnosis not present

## 2023-06-05 DIAGNOSIS — J449 Chronic obstructive pulmonary disease, unspecified: Secondary | ICD-10-CM | POA: Diagnosis not present

## 2023-06-05 DIAGNOSIS — I25118 Atherosclerotic heart disease of native coronary artery with other forms of angina pectoris: Secondary | ICD-10-CM | POA: Diagnosis not present

## 2023-06-05 DIAGNOSIS — K219 Gastro-esophageal reflux disease without esophagitis: Secondary | ICD-10-CM | POA: Diagnosis not present

## 2023-06-05 DIAGNOSIS — G309 Alzheimer's disease, unspecified: Secondary | ICD-10-CM | POA: Diagnosis not present

## 2023-06-05 DIAGNOSIS — E785 Hyperlipidemia, unspecified: Secondary | ICD-10-CM | POA: Diagnosis not present

## 2023-06-05 DIAGNOSIS — I10 Essential (primary) hypertension: Secondary | ICD-10-CM | POA: Diagnosis not present

## 2023-06-07 DIAGNOSIS — G309 Alzheimer's disease, unspecified: Secondary | ICD-10-CM | POA: Diagnosis not present

## 2023-06-07 DIAGNOSIS — J449 Chronic obstructive pulmonary disease, unspecified: Secondary | ICD-10-CM | POA: Diagnosis not present

## 2023-06-07 DIAGNOSIS — I10 Essential (primary) hypertension: Secondary | ICD-10-CM | POA: Diagnosis not present

## 2023-06-07 DIAGNOSIS — K219 Gastro-esophageal reflux disease without esophagitis: Secondary | ICD-10-CM | POA: Diagnosis not present

## 2023-06-07 DIAGNOSIS — E785 Hyperlipidemia, unspecified: Secondary | ICD-10-CM | POA: Diagnosis not present

## 2023-06-07 DIAGNOSIS — I25118 Atherosclerotic heart disease of native coronary artery with other forms of angina pectoris: Secondary | ICD-10-CM | POA: Diagnosis not present

## 2023-06-08 DIAGNOSIS — K219 Gastro-esophageal reflux disease without esophagitis: Secondary | ICD-10-CM | POA: Diagnosis not present

## 2023-06-08 DIAGNOSIS — E785 Hyperlipidemia, unspecified: Secondary | ICD-10-CM | POA: Diagnosis not present

## 2023-06-08 DIAGNOSIS — I25118 Atherosclerotic heart disease of native coronary artery with other forms of angina pectoris: Secondary | ICD-10-CM | POA: Diagnosis not present

## 2023-06-08 DIAGNOSIS — I10 Essential (primary) hypertension: Secondary | ICD-10-CM | POA: Diagnosis not present

## 2023-06-08 DIAGNOSIS — G309 Alzheimer's disease, unspecified: Secondary | ICD-10-CM | POA: Diagnosis not present

## 2023-06-08 DIAGNOSIS — J449 Chronic obstructive pulmonary disease, unspecified: Secondary | ICD-10-CM | POA: Diagnosis not present

## 2023-06-09 DIAGNOSIS — I10 Essential (primary) hypertension: Secondary | ICD-10-CM | POA: Diagnosis not present

## 2023-06-09 DIAGNOSIS — I25118 Atherosclerotic heart disease of native coronary artery with other forms of angina pectoris: Secondary | ICD-10-CM | POA: Diagnosis not present

## 2023-06-09 DIAGNOSIS — G309 Alzheimer's disease, unspecified: Secondary | ICD-10-CM | POA: Diagnosis not present

## 2023-06-09 DIAGNOSIS — E785 Hyperlipidemia, unspecified: Secondary | ICD-10-CM | POA: Diagnosis not present

## 2023-06-09 DIAGNOSIS — K219 Gastro-esophageal reflux disease without esophagitis: Secondary | ICD-10-CM | POA: Diagnosis not present

## 2023-06-09 DIAGNOSIS — J449 Chronic obstructive pulmonary disease, unspecified: Secondary | ICD-10-CM | POA: Diagnosis not present

## 2023-06-12 DIAGNOSIS — I10 Essential (primary) hypertension: Secondary | ICD-10-CM | POA: Diagnosis not present

## 2023-06-12 DIAGNOSIS — J449 Chronic obstructive pulmonary disease, unspecified: Secondary | ICD-10-CM | POA: Diagnosis not present

## 2023-06-12 DIAGNOSIS — G309 Alzheimer's disease, unspecified: Secondary | ICD-10-CM | POA: Diagnosis not present

## 2023-06-12 DIAGNOSIS — K219 Gastro-esophageal reflux disease without esophagitis: Secondary | ICD-10-CM | POA: Diagnosis not present

## 2023-06-12 DIAGNOSIS — I25118 Atherosclerotic heart disease of native coronary artery with other forms of angina pectoris: Secondary | ICD-10-CM | POA: Diagnosis not present

## 2023-06-12 DIAGNOSIS — E785 Hyperlipidemia, unspecified: Secondary | ICD-10-CM | POA: Diagnosis not present

## 2023-06-16 DIAGNOSIS — E785 Hyperlipidemia, unspecified: Secondary | ICD-10-CM | POA: Diagnosis not present

## 2023-06-16 DIAGNOSIS — K219 Gastro-esophageal reflux disease without esophagitis: Secondary | ICD-10-CM | POA: Diagnosis not present

## 2023-06-16 DIAGNOSIS — I25118 Atherosclerotic heart disease of native coronary artery with other forms of angina pectoris: Secondary | ICD-10-CM | POA: Diagnosis not present

## 2023-06-16 DIAGNOSIS — G309 Alzheimer's disease, unspecified: Secondary | ICD-10-CM | POA: Diagnosis not present

## 2023-06-16 DIAGNOSIS — I10 Essential (primary) hypertension: Secondary | ICD-10-CM | POA: Diagnosis not present

## 2023-06-16 DIAGNOSIS — J449 Chronic obstructive pulmonary disease, unspecified: Secondary | ICD-10-CM | POA: Diagnosis not present

## 2023-06-19 DIAGNOSIS — I25118 Atherosclerotic heart disease of native coronary artery with other forms of angina pectoris: Secondary | ICD-10-CM | POA: Diagnosis not present

## 2023-06-19 DIAGNOSIS — J449 Chronic obstructive pulmonary disease, unspecified: Secondary | ICD-10-CM | POA: Diagnosis not present

## 2023-06-19 DIAGNOSIS — I10 Essential (primary) hypertension: Secondary | ICD-10-CM | POA: Diagnosis not present

## 2023-06-19 DIAGNOSIS — E785 Hyperlipidemia, unspecified: Secondary | ICD-10-CM | POA: Diagnosis not present

## 2023-06-19 DIAGNOSIS — K219 Gastro-esophageal reflux disease without esophagitis: Secondary | ICD-10-CM | POA: Diagnosis not present

## 2023-06-19 DIAGNOSIS — G309 Alzheimer's disease, unspecified: Secondary | ICD-10-CM | POA: Diagnosis not present

## 2023-06-21 DIAGNOSIS — K219 Gastro-esophageal reflux disease without esophagitis: Secondary | ICD-10-CM | POA: Diagnosis not present

## 2023-06-21 DIAGNOSIS — I252 Old myocardial infarction: Secondary | ICD-10-CM | POA: Diagnosis not present

## 2023-06-21 DIAGNOSIS — I10 Essential (primary) hypertension: Secondary | ICD-10-CM | POA: Diagnosis not present

## 2023-06-21 DIAGNOSIS — I25118 Atherosclerotic heart disease of native coronary artery with other forms of angina pectoris: Secondary | ICD-10-CM | POA: Diagnosis not present

## 2023-06-21 DIAGNOSIS — F32A Depression, unspecified: Secondary | ICD-10-CM | POA: Diagnosis not present

## 2023-06-21 DIAGNOSIS — M109 Gout, unspecified: Secondary | ICD-10-CM | POA: Diagnosis not present

## 2023-06-21 DIAGNOSIS — F1911 Other psychoactive substance abuse, in remission: Secondary | ICD-10-CM | POA: Diagnosis not present

## 2023-06-21 DIAGNOSIS — J449 Chronic obstructive pulmonary disease, unspecified: Secondary | ICD-10-CM | POA: Diagnosis not present

## 2023-06-21 DIAGNOSIS — G309 Alzheimer's disease, unspecified: Secondary | ICD-10-CM | POA: Diagnosis not present

## 2023-06-21 DIAGNOSIS — E785 Hyperlipidemia, unspecified: Secondary | ICD-10-CM | POA: Diagnosis not present

## 2023-06-23 DIAGNOSIS — I10 Essential (primary) hypertension: Secondary | ICD-10-CM | POA: Diagnosis not present

## 2023-06-23 DIAGNOSIS — K219 Gastro-esophageal reflux disease without esophagitis: Secondary | ICD-10-CM | POA: Diagnosis not present

## 2023-06-23 DIAGNOSIS — G309 Alzheimer's disease, unspecified: Secondary | ICD-10-CM | POA: Diagnosis not present

## 2023-06-23 DIAGNOSIS — E785 Hyperlipidemia, unspecified: Secondary | ICD-10-CM | POA: Diagnosis not present

## 2023-06-23 DIAGNOSIS — I25118 Atherosclerotic heart disease of native coronary artery with other forms of angina pectoris: Secondary | ICD-10-CM | POA: Diagnosis not present

## 2023-06-23 DIAGNOSIS — J449 Chronic obstructive pulmonary disease, unspecified: Secondary | ICD-10-CM | POA: Diagnosis not present

## 2023-06-26 DIAGNOSIS — K219 Gastro-esophageal reflux disease without esophagitis: Secondary | ICD-10-CM | POA: Diagnosis not present

## 2023-06-26 DIAGNOSIS — J449 Chronic obstructive pulmonary disease, unspecified: Secondary | ICD-10-CM | POA: Diagnosis not present

## 2023-06-26 DIAGNOSIS — G309 Alzheimer's disease, unspecified: Secondary | ICD-10-CM | POA: Diagnosis not present

## 2023-06-26 DIAGNOSIS — E785 Hyperlipidemia, unspecified: Secondary | ICD-10-CM | POA: Diagnosis not present

## 2023-06-26 DIAGNOSIS — I10 Essential (primary) hypertension: Secondary | ICD-10-CM | POA: Diagnosis not present

## 2023-06-26 DIAGNOSIS — I25118 Atherosclerotic heart disease of native coronary artery with other forms of angina pectoris: Secondary | ICD-10-CM | POA: Diagnosis not present

## 2023-06-28 DIAGNOSIS — I10 Essential (primary) hypertension: Secondary | ICD-10-CM | POA: Diagnosis not present

## 2023-06-28 DIAGNOSIS — I25118 Atherosclerotic heart disease of native coronary artery with other forms of angina pectoris: Secondary | ICD-10-CM | POA: Diagnosis not present

## 2023-06-28 DIAGNOSIS — J449 Chronic obstructive pulmonary disease, unspecified: Secondary | ICD-10-CM | POA: Diagnosis not present

## 2023-06-28 DIAGNOSIS — E785 Hyperlipidemia, unspecified: Secondary | ICD-10-CM | POA: Diagnosis not present

## 2023-06-28 DIAGNOSIS — K219 Gastro-esophageal reflux disease without esophagitis: Secondary | ICD-10-CM | POA: Diagnosis not present

## 2023-06-28 DIAGNOSIS — G309 Alzheimer's disease, unspecified: Secondary | ICD-10-CM | POA: Diagnosis not present

## 2023-06-30 DIAGNOSIS — K219 Gastro-esophageal reflux disease without esophagitis: Secondary | ICD-10-CM | POA: Diagnosis not present

## 2023-06-30 DIAGNOSIS — E785 Hyperlipidemia, unspecified: Secondary | ICD-10-CM | POA: Diagnosis not present

## 2023-06-30 DIAGNOSIS — J449 Chronic obstructive pulmonary disease, unspecified: Secondary | ICD-10-CM | POA: Diagnosis not present

## 2023-06-30 DIAGNOSIS — I10 Essential (primary) hypertension: Secondary | ICD-10-CM | POA: Diagnosis not present

## 2023-06-30 DIAGNOSIS — G309 Alzheimer's disease, unspecified: Secondary | ICD-10-CM | POA: Diagnosis not present

## 2023-06-30 DIAGNOSIS — I25118 Atherosclerotic heart disease of native coronary artery with other forms of angina pectoris: Secondary | ICD-10-CM | POA: Diagnosis not present

## 2023-07-03 DIAGNOSIS — K219 Gastro-esophageal reflux disease without esophagitis: Secondary | ICD-10-CM | POA: Diagnosis not present

## 2023-07-03 DIAGNOSIS — E785 Hyperlipidemia, unspecified: Secondary | ICD-10-CM | POA: Diagnosis not present

## 2023-07-03 DIAGNOSIS — G309 Alzheimer's disease, unspecified: Secondary | ICD-10-CM | POA: Diagnosis not present

## 2023-07-03 DIAGNOSIS — I10 Essential (primary) hypertension: Secondary | ICD-10-CM | POA: Diagnosis not present

## 2023-07-03 DIAGNOSIS — I25118 Atherosclerotic heart disease of native coronary artery with other forms of angina pectoris: Secondary | ICD-10-CM | POA: Diagnosis not present

## 2023-07-03 DIAGNOSIS — J449 Chronic obstructive pulmonary disease, unspecified: Secondary | ICD-10-CM | POA: Diagnosis not present

## 2023-07-04 DIAGNOSIS — G309 Alzheimer's disease, unspecified: Secondary | ICD-10-CM | POA: Diagnosis not present

## 2023-07-04 DIAGNOSIS — K219 Gastro-esophageal reflux disease without esophagitis: Secondary | ICD-10-CM | POA: Diagnosis not present

## 2023-07-04 DIAGNOSIS — J449 Chronic obstructive pulmonary disease, unspecified: Secondary | ICD-10-CM | POA: Diagnosis not present

## 2023-07-04 DIAGNOSIS — I25118 Atherosclerotic heart disease of native coronary artery with other forms of angina pectoris: Secondary | ICD-10-CM | POA: Diagnosis not present

## 2023-07-04 DIAGNOSIS — I10 Essential (primary) hypertension: Secondary | ICD-10-CM | POA: Diagnosis not present

## 2023-07-04 DIAGNOSIS — E785 Hyperlipidemia, unspecified: Secondary | ICD-10-CM | POA: Diagnosis not present

## 2023-07-05 DIAGNOSIS — I10 Essential (primary) hypertension: Secondary | ICD-10-CM | POA: Diagnosis not present

## 2023-07-05 DIAGNOSIS — G309 Alzheimer's disease, unspecified: Secondary | ICD-10-CM | POA: Diagnosis not present

## 2023-07-05 DIAGNOSIS — I25118 Atherosclerotic heart disease of native coronary artery with other forms of angina pectoris: Secondary | ICD-10-CM | POA: Diagnosis not present

## 2023-07-05 DIAGNOSIS — K219 Gastro-esophageal reflux disease without esophagitis: Secondary | ICD-10-CM | POA: Diagnosis not present

## 2023-07-05 DIAGNOSIS — E785 Hyperlipidemia, unspecified: Secondary | ICD-10-CM | POA: Diagnosis not present

## 2023-07-05 DIAGNOSIS — J449 Chronic obstructive pulmonary disease, unspecified: Secondary | ICD-10-CM | POA: Diagnosis not present

## 2023-07-07 DIAGNOSIS — K219 Gastro-esophageal reflux disease without esophagitis: Secondary | ICD-10-CM | POA: Diagnosis not present

## 2023-07-07 DIAGNOSIS — G309 Alzheimer's disease, unspecified: Secondary | ICD-10-CM | POA: Diagnosis not present

## 2023-07-07 DIAGNOSIS — I25118 Atherosclerotic heart disease of native coronary artery with other forms of angina pectoris: Secondary | ICD-10-CM | POA: Diagnosis not present

## 2023-07-07 DIAGNOSIS — I10 Essential (primary) hypertension: Secondary | ICD-10-CM | POA: Diagnosis not present

## 2023-07-07 DIAGNOSIS — J449 Chronic obstructive pulmonary disease, unspecified: Secondary | ICD-10-CM | POA: Diagnosis not present

## 2023-07-07 DIAGNOSIS — E785 Hyperlipidemia, unspecified: Secondary | ICD-10-CM | POA: Diagnosis not present

## 2023-07-08 DIAGNOSIS — E785 Hyperlipidemia, unspecified: Secondary | ICD-10-CM | POA: Diagnosis not present

## 2023-07-08 DIAGNOSIS — K219 Gastro-esophageal reflux disease without esophagitis: Secondary | ICD-10-CM | POA: Diagnosis not present

## 2023-07-08 DIAGNOSIS — I10 Essential (primary) hypertension: Secondary | ICD-10-CM | POA: Diagnosis not present

## 2023-07-08 DIAGNOSIS — G309 Alzheimer's disease, unspecified: Secondary | ICD-10-CM | POA: Diagnosis not present

## 2023-07-08 DIAGNOSIS — I25118 Atherosclerotic heart disease of native coronary artery with other forms of angina pectoris: Secondary | ICD-10-CM | POA: Diagnosis not present

## 2023-07-08 DIAGNOSIS — J449 Chronic obstructive pulmonary disease, unspecified: Secondary | ICD-10-CM | POA: Diagnosis not present

## 2023-07-09 DIAGNOSIS — I10 Essential (primary) hypertension: Secondary | ICD-10-CM | POA: Diagnosis not present

## 2023-07-09 DIAGNOSIS — G309 Alzheimer's disease, unspecified: Secondary | ICD-10-CM | POA: Diagnosis not present

## 2023-07-09 DIAGNOSIS — J449 Chronic obstructive pulmonary disease, unspecified: Secondary | ICD-10-CM | POA: Diagnosis not present

## 2023-07-09 DIAGNOSIS — K219 Gastro-esophageal reflux disease without esophagitis: Secondary | ICD-10-CM | POA: Diagnosis not present

## 2023-07-09 DIAGNOSIS — I25118 Atherosclerotic heart disease of native coronary artery with other forms of angina pectoris: Secondary | ICD-10-CM | POA: Diagnosis not present

## 2023-07-09 DIAGNOSIS — E785 Hyperlipidemia, unspecified: Secondary | ICD-10-CM | POA: Diagnosis not present

## 2023-07-10 ENCOUNTER — Telehealth: Payer: Self-pay

## 2023-07-10 DIAGNOSIS — E785 Hyperlipidemia, unspecified: Secondary | ICD-10-CM | POA: Diagnosis not present

## 2023-07-10 DIAGNOSIS — J449 Chronic obstructive pulmonary disease, unspecified: Secondary | ICD-10-CM | POA: Diagnosis not present

## 2023-07-10 DIAGNOSIS — K219 Gastro-esophageal reflux disease without esophagitis: Secondary | ICD-10-CM | POA: Diagnosis not present

## 2023-07-10 DIAGNOSIS — I25118 Atherosclerotic heart disease of native coronary artery with other forms of angina pectoris: Secondary | ICD-10-CM | POA: Diagnosis not present

## 2023-07-10 DIAGNOSIS — I10 Essential (primary) hypertension: Secondary | ICD-10-CM | POA: Diagnosis not present

## 2023-07-10 DIAGNOSIS — G309 Alzheimer's disease, unspecified: Secondary | ICD-10-CM | POA: Diagnosis not present

## 2023-07-10 NOTE — Telephone Encounter (Signed)
Copied from CRM [id number]. Topic: General - Deceased Patient >> August 01, 2023  4:59 PM Florestine Avers wrote: Name of caller: Claris Che  Date of death: 08-01-2023   Name of funeral home: Unknown  Phone number of funeral home: Unknown  Provider that needs to sign form: Dr. Caryl Never  Timeline for signing: Unknown, Hospice just wanted to report the patients death.

## 2023-07-20 ENCOUNTER — Telehealth: Payer: Self-pay | Admitting: Family Medicine

## 2023-07-20 NOTE — Telephone Encounter (Signed)
Copied from CRM [id number]. Topic: General - Deceased Patient >> 08-05-23  2:05 PM Adaysia C wrote: Name of caller: Lupita Leash   Date of death: 07/26/23   Name of funeral home: [facility]  Phone number of funeral home: [phone]  Provider that needs to sign form: [name] Burchette  Timeline for signing: As soon as possible

## 2023-07-21 NOTE — Telephone Encounter (Signed)
I spoke with Alinda Money at [employer] [facility] and informed him of the message below

## 2023-07-22 DEATH — deceased

## 2023-07-25 ENCOUNTER — Telehealth: Payer: Self-pay | Admitting: Family Medicine

## 2023-07-25 NOTE — Telephone Encounter (Signed)
 Copied from CRM [id number]. Topic: General - Deceased Patient >> August 14, 2023  1:22 PM Gerardine PARAS wrote: Name of caller: Arland  Date of death: 2023/07/30  Name of funeral home: [facility]  Phone number of funeral home: [phone]  Provider that needs to sign form: Wolm Scarlet The death certificate was already signed and is in [facility] website stated Dr. Scarlet, Arland is wondering if maybe the patient was created under a different dave number than what she has because she states she can not locate a signed death certificate. Deatrice number provided is [phone]. Arland asked if the clinic would have a different dave number to please contact and relay over   Timeline for signing: Already completed

## 2023-07-31 ENCOUNTER — Encounter: Payer: Self-pay | Admitting: Family Medicine

## 2023-08-02 NOTE — Telephone Encounter (Signed)
I spoke with Lupita Leash at [facility] and she stated the Death certificate was completed by Hospice for the patient.

## 2023-08-02 NOTE — Telephone Encounter (Signed)
I spoke with Jesus Jordan with [facility] and she stated the Death certificate was completed by Hospice for the patient.
